# Patient Record
Sex: Female | Born: 1950
Health system: Southern US, Community
[De-identification: ages and names within clinical notes are randomized; demographics above are authoritative.]

## PROBLEM LIST (undated history)

## (undated) DIAGNOSIS — M5124 Other intervertebral disc displacement, thoracic region: Secondary | ICD-10-CM

## (undated) DIAGNOSIS — E785 Hyperlipidemia, unspecified: Secondary | ICD-10-CM

## (undated) DIAGNOSIS — G43909 Migraine, unspecified, not intractable, without status migrainosus: Secondary | ICD-10-CM

## (undated) DIAGNOSIS — M5134 Other intervertebral disc degeneration, thoracic region: Secondary | ICD-10-CM

## (undated) DIAGNOSIS — F319 Bipolar disorder, unspecified: Secondary | ICD-10-CM

## (undated) DIAGNOSIS — M5020 Other cervical disc displacement, unspecified cervical region: Secondary | ICD-10-CM

## (undated) DIAGNOSIS — E119 Type 2 diabetes mellitus without complications: Secondary | ICD-10-CM

## (undated) DIAGNOSIS — F329 Major depressive disorder, single episode, unspecified: Secondary | ICD-10-CM

## (undated) DIAGNOSIS — F32A Depression, unspecified: Secondary | ICD-10-CM

## (undated) DIAGNOSIS — R4589 Other symptoms and signs involving emotional state: Secondary | ICD-10-CM

## (undated) DIAGNOSIS — F419 Anxiety disorder, unspecified: Secondary | ICD-10-CM

## (undated) DIAGNOSIS — M5030 Other cervical disc degeneration, unspecified cervical region: Secondary | ICD-10-CM

## (undated) DIAGNOSIS — F4310 Post-traumatic stress disorder, unspecified: Secondary | ICD-10-CM

## (undated) HISTORY — DX: Other cervical disc displacement, unspecified cervical region: M50.20

## (undated) HISTORY — DX: Other intervertebral disc displacement, thoracic region: M51.24

## (undated) HISTORY — DX: Other cervical disc degeneration, unspecified cervical region: M50.30

## (undated) HISTORY — DX: Other symptoms and signs involving emotional state: R45.89

## (undated) HISTORY — PX: EYE SURGERY: SHX253

## (undated) HISTORY — DX: Hyperlipidemia, unspecified: E78.5

## (undated) HISTORY — DX: Other intervertebral disc degeneration, thoracic region: M51.34

---

## 1995-04-01 HISTORY — PX: HEMICOLECTOMY: SHX854

## 2011-04-01 HISTORY — PX: JOINT REPLACEMENT: SHX530

## 2011-04-07 DIAGNOSIS — M1610 Unilateral primary osteoarthritis, unspecified hip: Secondary | ICD-10-CM | POA: Insufficient documentation

## 2011-04-07 DIAGNOSIS — M87059 Idiopathic aseptic necrosis of unspecified femur: Secondary | ICD-10-CM | POA: Insufficient documentation

## 2012-08-02 DIAGNOSIS — F319 Bipolar disorder, unspecified: Secondary | ICD-10-CM | POA: Insufficient documentation

## 2012-08-02 DIAGNOSIS — F411 Generalized anxiety disorder: Secondary | ICD-10-CM | POA: Insufficient documentation

## 2012-08-02 DIAGNOSIS — M1610 Unilateral primary osteoarthritis, unspecified hip: Secondary | ICD-10-CM | POA: Insufficient documentation

## 2012-08-02 DIAGNOSIS — M169 Osteoarthritis of hip, unspecified: Secondary | ICD-10-CM | POA: Insufficient documentation

## 2012-08-02 DIAGNOSIS — Z96649 Presence of unspecified artificial hip joint: Secondary | ICD-10-CM | POA: Insufficient documentation

## 2012-08-02 DIAGNOSIS — I10 Essential (primary) hypertension: Secondary | ICD-10-CM | POA: Insufficient documentation

## 2012-08-02 DIAGNOSIS — F4310 Post-traumatic stress disorder, unspecified: Secondary | ICD-10-CM | POA: Insufficient documentation

## 2012-08-02 DIAGNOSIS — Z5189 Encounter for other specified aftercare: Secondary | ICD-10-CM | POA: Insufficient documentation

## 2014-04-05 DIAGNOSIS — F310 Bipolar disorder, current episode hypomanic: Secondary | ICD-10-CM | POA: Diagnosis not present

## 2014-06-27 DIAGNOSIS — F310 Bipolar disorder, current episode hypomanic: Secondary | ICD-10-CM | POA: Diagnosis not present

## 2014-07-18 DIAGNOSIS — F310 Bipolar disorder, current episode hypomanic: Secondary | ICD-10-CM | POA: Diagnosis not present

## 2014-07-28 DIAGNOSIS — F310 Bipolar disorder, current episode hypomanic: Secondary | ICD-10-CM | POA: Diagnosis not present

## 2014-08-08 DIAGNOSIS — F310 Bipolar disorder, current episode hypomanic: Secondary | ICD-10-CM | POA: Diagnosis not present

## 2014-08-31 DIAGNOSIS — F310 Bipolar disorder, current episode hypomanic: Secondary | ICD-10-CM | POA: Diagnosis not present

## 2014-09-14 DIAGNOSIS — F310 Bipolar disorder, current episode hypomanic: Secondary | ICD-10-CM | POA: Diagnosis not present

## 2014-09-18 DIAGNOSIS — F310 Bipolar disorder, current episode hypomanic: Secondary | ICD-10-CM | POA: Diagnosis not present

## 2014-10-12 DIAGNOSIS — F310 Bipolar disorder, current episode hypomanic: Secondary | ICD-10-CM | POA: Diagnosis not present

## 2014-10-26 DIAGNOSIS — F3181 Bipolar II disorder: Secondary | ICD-10-CM | POA: Diagnosis not present

## 2014-11-02 DIAGNOSIS — F332 Major depressive disorder, recurrent severe without psychotic features: Secondary | ICD-10-CM | POA: Diagnosis not present

## 2014-11-10 DIAGNOSIS — F3112 Bipolar disorder, current episode manic without psychotic features, moderate: Secondary | ICD-10-CM | POA: Diagnosis not present

## 2014-12-01 DIAGNOSIS — F310 Bipolar disorder, current episode hypomanic: Secondary | ICD-10-CM | POA: Diagnosis not present

## 2015-01-16 DIAGNOSIS — F3342 Major depressive disorder, recurrent, in full remission: Secondary | ICD-10-CM | POA: Diagnosis not present

## 2015-01-16 DIAGNOSIS — Z79899 Other long term (current) drug therapy: Secondary | ICD-10-CM | POA: Diagnosis not present

## 2015-01-16 DIAGNOSIS — E559 Vitamin D deficiency, unspecified: Secondary | ICD-10-CM | POA: Diagnosis not present

## 2015-01-18 DIAGNOSIS — F3342 Major depressive disorder, recurrent, in full remission: Secondary | ICD-10-CM | POA: Diagnosis not present

## 2015-02-01 DIAGNOSIS — F3342 Major depressive disorder, recurrent, in full remission: Secondary | ICD-10-CM | POA: Diagnosis not present

## 2015-02-15 DIAGNOSIS — F3342 Major depressive disorder, recurrent, in full remission: Secondary | ICD-10-CM | POA: Diagnosis not present

## 2015-02-15 DIAGNOSIS — F411 Generalized anxiety disorder: Secondary | ICD-10-CM | POA: Diagnosis not present

## 2015-02-20 DIAGNOSIS — E7800 Pure hypercholesterolemia, unspecified: Secondary | ICD-10-CM | POA: Diagnosis not present

## 2015-02-20 DIAGNOSIS — I10 Essential (primary) hypertension: Secondary | ICD-10-CM | POA: Diagnosis not present

## 2015-02-20 DIAGNOSIS — E119 Type 2 diabetes mellitus without complications: Secondary | ICD-10-CM | POA: Diagnosis not present

## 2015-02-20 DIAGNOSIS — F4320 Adjustment disorder, unspecified: Secondary | ICD-10-CM | POA: Diagnosis not present

## 2015-02-28 DIAGNOSIS — E119 Type 2 diabetes mellitus without complications: Secondary | ICD-10-CM | POA: Diagnosis not present

## 2015-02-28 DIAGNOSIS — H02831 Dermatochalasis of right upper eyelid: Secondary | ICD-10-CM | POA: Diagnosis not present

## 2015-02-28 DIAGNOSIS — I10 Essential (primary) hypertension: Secondary | ICD-10-CM | POA: Diagnosis not present

## 2015-02-28 DIAGNOSIS — H43813 Vitreous degeneration, bilateral: Secondary | ICD-10-CM | POA: Diagnosis not present

## 2015-03-02 DIAGNOSIS — F410 Panic disorder [episodic paroxysmal anxiety] without agoraphobia: Secondary | ICD-10-CM | POA: Diagnosis not present

## 2015-03-19 DIAGNOSIS — F410 Panic disorder [episodic paroxysmal anxiety] without agoraphobia: Secondary | ICD-10-CM | POA: Diagnosis not present

## 2015-03-21 DIAGNOSIS — E119 Type 2 diabetes mellitus without complications: Secondary | ICD-10-CM | POA: Diagnosis not present

## 2015-03-21 DIAGNOSIS — H43813 Vitreous degeneration, bilateral: Secondary | ICD-10-CM | POA: Diagnosis not present

## 2015-03-21 DIAGNOSIS — I10 Essential (primary) hypertension: Secondary | ICD-10-CM | POA: Diagnosis not present

## 2015-03-21 DIAGNOSIS — H02831 Dermatochalasis of right upper eyelid: Secondary | ICD-10-CM | POA: Diagnosis not present

## 2015-05-04 DIAGNOSIS — F3342 Major depressive disorder, recurrent, in full remission: Secondary | ICD-10-CM | POA: Diagnosis not present

## 2015-07-23 DIAGNOSIS — F3342 Major depressive disorder, recurrent, in full remission: Secondary | ICD-10-CM | POA: Diagnosis not present

## 2015-07-23 DIAGNOSIS — Z79899 Other long term (current) drug therapy: Secondary | ICD-10-CM | POA: Diagnosis not present

## 2015-07-24 DIAGNOSIS — F310 Bipolar disorder, current episode hypomanic: Secondary | ICD-10-CM | POA: Diagnosis not present

## 2015-09-12 DIAGNOSIS — Z79899 Other long term (current) drug therapy: Secondary | ICD-10-CM | POA: Diagnosis not present

## 2015-09-12 DIAGNOSIS — F3342 Major depressive disorder, recurrent, in full remission: Secondary | ICD-10-CM | POA: Diagnosis not present

## 2015-10-07 DIAGNOSIS — W57XXXA Bitten or stung by nonvenomous insect and other nonvenomous arthropods, initial encounter: Secondary | ICD-10-CM | POA: Diagnosis not present

## 2015-10-07 DIAGNOSIS — S91311A Laceration without foreign body, right foot, initial encounter: Secondary | ICD-10-CM | POA: Diagnosis not present

## 2015-10-07 DIAGNOSIS — S91331A Puncture wound without foreign body, right foot, initial encounter: Secondary | ICD-10-CM | POA: Diagnosis not present

## 2015-10-07 DIAGNOSIS — Z79899 Other long term (current) drug therapy: Secondary | ICD-10-CM | POA: Diagnosis not present

## 2015-10-07 DIAGNOSIS — S90861A Insect bite (nonvenomous), right foot, initial encounter: Secondary | ICD-10-CM | POA: Diagnosis not present

## 2015-10-07 DIAGNOSIS — I10 Essential (primary) hypertension: Secondary | ICD-10-CM | POA: Diagnosis not present

## 2015-10-07 DIAGNOSIS — Z8659 Personal history of other mental and behavioral disorders: Secondary | ICD-10-CM | POA: Diagnosis not present

## 2015-10-07 DIAGNOSIS — Z9889 Other specified postprocedural states: Secondary | ICD-10-CM | POA: Diagnosis not present

## 2015-10-07 DIAGNOSIS — M1990 Unspecified osteoarthritis, unspecified site: Secondary | ICD-10-CM | POA: Diagnosis not present

## 2015-10-07 DIAGNOSIS — S91332A Puncture wound without foreign body, left foot, initial encounter: Secondary | ICD-10-CM | POA: Diagnosis not present

## 2015-10-07 DIAGNOSIS — M19071 Primary osteoarthritis, right ankle and foot: Secondary | ICD-10-CM | POA: Diagnosis not present

## 2015-10-08 DIAGNOSIS — S91331A Puncture wound without foreign body, right foot, initial encounter: Secondary | ICD-10-CM | POA: Diagnosis not present

## 2015-10-08 DIAGNOSIS — M19071 Primary osteoarthritis, right ankle and foot: Secondary | ICD-10-CM | POA: Diagnosis not present

## 2015-10-09 ENCOUNTER — Encounter (HOSPITAL_COMMUNITY): Payer: Self-pay | Admitting: Emergency Medicine

## 2015-10-09 ENCOUNTER — Emergency Department (HOSPITAL_COMMUNITY)
Admission: EM | Admit: 2015-10-09 | Discharge: 2015-10-09 | Disposition: A | Discharge status: Home / Self Care | Payer: Medicare Other | Attending: Emergency Medicine | Admitting: Emergency Medicine

## 2015-10-09 DIAGNOSIS — F319 Bipolar disorder, unspecified: Secondary | ICD-10-CM | POA: Insufficient documentation

## 2015-10-09 DIAGNOSIS — E119 Type 2 diabetes mellitus without complications: Secondary | ICD-10-CM | POA: Insufficient documentation

## 2015-10-09 DIAGNOSIS — L089 Local infection of the skin and subcutaneous tissue, unspecified: Secondary | ICD-10-CM | POA: Diagnosis present

## 2015-10-09 DIAGNOSIS — L03031 Cellulitis of right toe: Secondary | ICD-10-CM | POA: Diagnosis not present

## 2015-10-09 HISTORY — DX: Major depressive disorder, single episode, unspecified: F32.9

## 2015-10-09 HISTORY — DX: Post-traumatic stress disorder, unspecified: F43.10

## 2015-10-09 HISTORY — DX: Bipolar disorder, unspecified: F31.9

## 2015-10-09 HISTORY — DX: Depression, unspecified: F32.A

## 2015-10-09 HISTORY — DX: Type 2 diabetes mellitus without complications: E11.9

## 2015-10-09 HISTORY — DX: Anxiety disorder, unspecified: F41.9

## 2015-10-09 LAB — CBC WITH DIFFERENTIAL/PLATELET
BASOS ABS: 0 10*3/uL (ref 0.0–0.1)
BASOS PCT: 0 %
Eosinophils Absolute: 0 10*3/uL (ref 0.0–0.7)
Eosinophils Relative: 0 %
HCT: 33.5 % — ABNORMAL LOW (ref 36.0–46.0)
HEMOGLOBIN: 11.1 g/dL — AB (ref 12.0–15.0)
Lymphocytes Relative: 26 %
Lymphs Abs: 1.5 10*3/uL (ref 0.7–4.0)
MCH: 29 pg (ref 26.0–34.0)
MCHC: 33.1 g/dL (ref 30.0–36.0)
MCV: 87.5 fL (ref 78.0–100.0)
Monocytes Absolute: 0.5 10*3/uL (ref 0.1–1.0)
Monocytes Relative: 8 %
NEUTROS ABS: 3.9 10*3/uL (ref 1.7–7.7)
NEUTROS PCT: 66 %
PLATELETS: 179 10*3/uL (ref 150–400)
RBC: 3.83 MIL/uL — ABNORMAL LOW (ref 3.87–5.11)
RDW: 13 % (ref 11.5–15.5)
WBC: 5.8 10*3/uL (ref 4.0–10.5)

## 2015-10-09 LAB — BASIC METABOLIC PANEL
Anion gap: 8 (ref 5–15)
BUN: 21 mg/dL — AB (ref 6–20)
CO2: 23 mmol/L (ref 22–32)
CREATININE: 0.82 mg/dL (ref 0.44–1.00)
Calcium: 8.6 mg/dL — ABNORMAL LOW (ref 8.9–10.3)
Chloride: 109 mmol/L (ref 101–111)
GFR calc Af Amer: >60 mL/min (ref >60)
GLUCOSE: 129 mg/dL — AB (ref 65–99)
POTASSIUM: 3.6 mmol/L (ref 3.5–5.1)
SODIUM: 140 mmol/L (ref 135–145)

## 2015-10-09 LAB — SEDIMENTATION RATE: Sed Rate: 10 mm/hr (ref 0–22)

## 2015-10-09 MED ORDER — VANCOMYCIN HCL IN DEXTROSE 1-5 GM/200ML-% IV SOLN
1000.0000 mg | Freq: Once | INTRAVENOUS | Status: AC
  Administered 2015-10-09: 1000 mg via INTRAVENOUS
  Filled 2015-10-09: qty 200

## 2015-10-09 NOTE — ED Provider Notes (Signed)
CSN: [id number]     Arrival date & time 10/09/15  0130 History  By signing my name below, I, Hansel Feinstein, attest that this documentation has been prepared under the direction and in the presence of Delora Fuel, MD. Electronically Signed: Hansel Feinstein, ED Scribe. 10/09/2015. 4:03 AM.     Chief Complaint  Patient presents with  . Wound Infection   The history is provided by the patient. No language interpreter was used.    HPI Comments: Kaitlyn Wood is a 65 y.o. female with h/o DM who presents to the Emergency Department complaining of increased 4/10 pain and redness to two wounds on the medial right great toe onset yesterday. Per pt, a puncture wound on her medial right great toe became painful and red yesterday. She is not sure when this wound first occurred. She also notes that she was bitten by an insect while gardening 2 days ago in the web spacing of her 1st and 2nd toe and reports that there has been increased pain and redness since yesterday to that area as well. Pt was seen at [facility] yesterday for these complains and started on clindamycin, and reports that the redness began after starting this medication. She denies fever, chills, diaphoresis. Pt is not currently followed by a PCP.    Past Medical History  Diagnosis Date  . Diabetes mellitus without complication ([place])   . Bipolar 1 disorder ([place])   . Depression   . Anxiety   . PTSD (post-traumatic stress disorder)    Past Surgical History  Procedure Laterality Date  . Joint replacement      hip   History reviewed. No pertinent family history. Social History  Substance Use Topics  . Smoking status: Never Smoker   . Smokeless tobacco: None  . Alcohol Use: No   OB History    No data available     Review of Systems  Constitutional: Negative for fever, chills and diaphoresis.  Musculoskeletal: Positive for arthralgias.  Skin: Positive for color change (redness).       +area of pain and redness to wounds on the right foot  All  other systems reviewed and are negative.  Allergies  Oxycodone and Statins  Home Medications   Prior to Admission medications   Not on File   BP 147/110 mmHg  Pulse 77  Temp(Src) 98.8 F (37.1 C) (Oral)  Resp 18  Ht 5\' 3"  (1.6 m)  Wt 181 lb (82.101 kg)  BMI 32.07 kg/m2  SpO2 97% Physical Exam  Constitutional: She is oriented to person, place, and time. She appears well-developed and well-nourished.  Obese.   HENT:  Head: Normocephalic and atraumatic.  Eyes: Conjunctivae and EOM are normal. Pupils are equal, round, and reactive to light.  Neck: Normal range of motion. Neck supple. No JVD present.  Cardiovascular: Normal rate, regular rhythm and normal heart sounds.   No murmur heard. Pulmonary/Chest: Effort normal and breath sounds normal. She has no wheezes. She has no rales. She exhibits no tenderness.  Abdominal: Soft. Bowel sounds are normal. She exhibits no distension and no mass. There is no tenderness.  Musculoskeletal: Normal range of motion. She exhibits edema.  1+ pretibial edema bilaterally. Right 1st toe has a small puncture wound on the plantar surface and a superficial laceration dorsally. Mild erythema of the toe extending to the mid foot. No lymphangitic streaks seen.   Lymphadenopathy:    She has no cervical adenopathy.  Neurological: She is alert and oriented to person, place,  and time. No cranial nerve deficit. She exhibits normal muscle tone. Coordination normal.  Skin: Skin is warm and dry. There is erythema.  Psychiatric: She has a normal mood and affect. Her behavior is normal. Judgment and thought content normal.  Nursing note and vitals reviewed.   ED Course  Procedures (including critical care time) DIAGNOSTIC STUDIES: Oxygen Saturation is 97% on RA, normal by my interpretation.    COORDINATION OF CARE: 4:00 AM Discussed treatment plan with pt at bedside which includes lab work and pt agreed to plan.   Labs Review Results for orders placed or  performed during the hospital encounter of 10/09/15  CBC with Differential  Result Value Ref Range   WBC 5.8 4.0 - 10.5 K/uL   RBC 3.83 (L) 3.87 - 5.11 MIL/uL   Hemoglobin 11.1 (L) 12.0 - 15.0 g/dL   HCT 33.5 (L) 36.0 - 46.0 %   MCV 87.5 78.0 - 100.0 fL   MCH 29.0 26.0 - 34.0 pg   MCHC 33.1 30.0 - 36.0 g/dL   RDW 13.0 11.5 - 15.5 %   Platelets 179 150 - 400 K/uL   Neutrophils Relative % 66 %   Neutro Abs 3.9 1.7 - 7.7 K/uL   Lymphocytes Relative 26 %   Lymphs Abs 1.5 0.7 - 4.0 K/uL   Monocytes Relative 8 %   Monocytes Absolute 0.5 0.1 - 1.0 K/uL   Eosinophils Relative 0 %   Eosinophils Absolute 0.0 0.0 - 0.7 K/uL   Basophils Relative 0 %   Basophils Absolute 0.0 0.0 - 0.1 K/uL  Basic metabolic panel  Result Value Ref Range   Sodium 140 135 - 145 mmol/L   Potassium 3.6 3.5 - 5.1 mmol/L   Chloride 109 101 - 111 mmol/L   CO2 23 22 - 32 mmol/L   Glucose, Bld 129 (H) 65 - 99 mg/dL   BUN 21 (H) 6 - 20 mg/dL   Creatinine, Ser 0.82 0.44 - 1.00 mg/dL   Calcium 8.6 (L) 8.9 - 10.3 mg/dL   GFR calc non Af Amer >60 >60 mL/min   GFR calc Af Amer >60 >60 mL/min   Anion gap 8 5 - 15  Sedimentation rate  Result Value Ref Range   Sed Rate 10 0 - 22 mm/hr   I have personally reviewed and evaluated these lab results as part of my medical decision-making.    MDM   Final diagnoses:  Cellulitis of great toe, right    Cellulitis of the right first toe and surrounding midfoot with puncture wound noted on the plantar surface. Slightly concerning that the erythema seems to be increasing after initial dose of antibiotic, but she has not received an adequate trial of the antibiotic. Screening labs are obtained including sedimentation rate and are reassuring. Sedimentation rate is 10 and WBC 5.8 without left shift. She was given dose of vancomycin while waiting for results. She is advised to continue taking her antibiotic and to soak the foot warm water several times a day. Return should she  develop fever, increasing pain, or significant spread of the erythema.  I personally performed the services described in this documentation, which was scribed in my presence. The recorded information has been reviewed and is accurate.      Delora Fuel, MD [id number]

## 2015-10-09 NOTE — Discharge Instructions (Signed)
Continue taking the antibiotic that was prescribed for you. Return if redness seems to be spreading, you start developing a fever, or your foot becomes significantly more painful. You should have this rechecked in 2 days to makes her that it is responding appropriately.   Cellulitis Cellulitis is an infection of the skin and the tissue beneath it. The infected area is usually red and tender. Cellulitis occurs most often in the arms and lower legs.  CAUSES  Cellulitis is caused by bacteria that enter the skin through cracks or cuts in the skin. The most common types of bacteria that cause cellulitis are staphylococci and streptococci. SIGNS AND SYMPTOMS   Redness and warmth.  Swelling.  Tenderness or pain.  Fever. DIAGNOSIS  Your health care provider can usually determine what is wrong based on a physical exam. Blood tests may also be done. TREATMENT  Treatment usually involves taking an antibiotic medicine. HOME CARE INSTRUCTIONS   Take your antibiotic medicine as directed by your health care provider. Finish the antibiotic even if you start to feel better.  Keep the infected arm or leg elevated to reduce swelling.  Apply a warm cloth to the affected area up to 4 times per day to relieve pain.  Take medicines only as directed by your health care provider.  Keep all follow-up visits as directed by your health care provider. SEEK MEDICAL CARE IF:   You notice red streaks coming from the infected area.  Your red area gets larger or turns dark in color.  Your bone or joint underneath the infected area becomes painful after the skin has healed.  Your infection returns in the same area or another area.  You notice a swollen bump in the infected area.  You develop new symptoms.  You have a fever. SEEK IMMEDIATE MEDICAL CARE IF:   You feel very sleepy.  You develop vomiting or diarrhea.  You have a general ill feeling (malaise) with muscle aches and pains.   This  information is not intended to replace advice given to you by your health care provider. Make sure you discuss any questions you have with your health care provider.   Document Released: 12/25/2004 Document Revised: 12/06/2014 Document Reviewed: 06/02/2011 Elsevier Interactive Patient Education [employer].

## 2015-10-09 NOTE — ED Notes (Signed)
Pt states that she got a puncture wound yesterday to the bottom of her rt great toe.  Pt states that she has an appt for it today at 145 pm but "they told me if it looks infected to come to the urgent care".  Redness noted to area.

## 2015-10-09 NOTE — ED Notes (Signed)
Pt not visualized in waiting room.

## 2015-10-11 ENCOUNTER — Ambulatory Visit (HOSPITAL_COMMUNITY)
Admission: EM | Admit: 2015-10-11 | Discharge: 2015-10-11 | Disposition: A | Discharge status: Home / Self Care | Payer: Medicare Other | Attending: Family Medicine | Admitting: Family Medicine

## 2015-10-11 ENCOUNTER — Encounter (HOSPITAL_COMMUNITY): Payer: Self-pay | Admitting: Emergency Medicine

## 2015-10-11 DIAGNOSIS — L03031 Cellulitis of right toe: Secondary | ICD-10-CM | POA: Diagnosis not present

## 2015-10-11 DIAGNOSIS — R030 Elevated blood-pressure reading, without diagnosis of hypertension: Secondary | ICD-10-CM | POA: Diagnosis not present

## 2015-10-11 DIAGNOSIS — Z5189 Encounter for other specified aftercare: Secondary | ICD-10-CM

## 2015-10-11 DIAGNOSIS — IMO0001 Reserved for inherently not codable concepts without codable children: Secondary | ICD-10-CM

## 2015-10-11 MED ORDER — CLINDAMYCIN HCL 300 MG PO CAPS: 300.0000 mg | ORAL_CAPSULE | Freq: Three times a day (TID) | ORAL | Status: DC

## 2015-10-11 MED ORDER — KETOROLAC TROMETHAMINE 30 MG/ML IJ SOLN
INTRAMUSCULAR | Status: AC
  Filled 2015-10-11: qty 1

## 2015-10-11 MED ORDER — KETOROLAC TROMETHAMINE 60 MG/2ML IM SOLN
30.0000 mg | Freq: Once | INTRAMUSCULAR | Status: AC
  Administered 2015-10-11: 30 mg via INTRAMUSCULAR

## 2015-10-11 NOTE — ED Notes (Signed)
The patient presented to the [address] [facility] with a complaint of needing the wound on her great toe on the right foot checked. The patient was evaluated at [facility] ED for cellulitis and was treated with IV antibiotics and advised to return to the [facility] for a wound check.

## 2015-10-11 NOTE — ED Provider Notes (Signed)
CSN: [id number]     Arrival date & time 10/11/15  1825 History   First MD Initiated Contact with Patient 10/11/15 1939     Chief Complaint  Patient presents with  . Wound Check   (Consider location/radiation/quality/duration/timing/severity/associated sxs/prior Treatment) HPI  Latea Lockard is a 65 y.o. female presenting to UC for recheck of an infected wound on her Right great toe.  Pt is new to the area. She initially went to an urgent care and was prescribed 3 days worth of oral antibiotics and a topical antibiotic. She was seen at [facility] ED on 10/09/15 for same, she was given a shot of vancomycin and advised to keep taking her antibiotics. She is here today for a recheck as she is not sure it is healing properly.  She has hx of DM.   Toe pain is mildly sore, 3/10.  She notes there are still 2 wounds and mild redness but states it does look better than it use to. Denies fever, n/v/d.  She does not have a PCP yet to f/u with as she is new to the area.   BP elevated in triage. No hx of HTN. She does have mild generalized headache, hx of migraines. She has not taken anything for her headache or foot pain PTA.    Past Medical History  Diagnosis Date  . Diabetes mellitus without complication ([place])   . Bipolar 1 disorder ([place])   . Depression   . Anxiety   . PTSD (post-traumatic stress disorder)    Past Surgical History  Procedure Laterality Date  . Joint replacement      hip   History reviewed. No pertinent family history. Social History  Substance Use Topics  . Smoking status: Never Smoker   . Smokeless tobacco: None  . Alcohol Use: No   OB History    No data available     Review of Systems  Constitutional: Negative for fever and chills.  Gastrointestinal: Negative for nausea, vomiting, abdominal pain and diarrhea.  Musculoskeletal: Positive for myalgias and arthralgias. Negative for joint swelling.  Skin: Positive for color change and wound.  Neurological: Positive for  headaches. Negative for dizziness and light-headedness.    Allergies  Oxycodone and Statins  Home Medications   Prior to Admission medications   Medication Sig Start Date End Date Taking? Authorizing Provider  buPROPion (WELLBUTRIN SR) 150 MG 12 hr tablet Take 150 mg by mouth 2 (two) times daily.   Yes Historical Provider, MD  clonazePAM (KLONOPIN) 1 MG tablet Take 1 mg by mouth 2 (two) times daily.   Yes Historical Provider, MD  lamoTRIgine (LAMICTAL) 200 MG tablet Take 200 mg by mouth daily.   Yes Historical Provider, MD  lisdexamfetamine (VYVANSE) 30 MG capsule Take 30 mg by mouth daily.   Yes Historical Provider, MD  lurasidone (LATUDA) 40 MG TABS tablet Take 40 mg by mouth daily with breakfast.   Yes Historical Provider, MD  clindamycin (CLEOCIN) 300 MG capsule Take 1 capsule (300 mg total) by mouth 3 (three) times daily. X 5 days 10/11/15   Noland Fordyce, PA-C   Meds Ordered and Administered this Visit   Medications  ketorolac (TORADOL) injection 30 mg (30 mg Intramuscular Given 10/11/15 2014)    BP 138/81 mmHg  Pulse 85  Temp(Src) 98.6 F (37 C) (Oral)  Resp 16  SpO2 99% No data found.   Physical Exam  Constitutional: She is oriented to person, place, and time. She appears well-developed and well-nourished.  HENT:  Head: Normocephalic and atraumatic.  Eyes: EOM are normal.  Neck: Normal range of motion.  Cardiovascular: Normal rate.   Pulmonary/Chest: Effort normal.  Musculoskeletal: Normal range of motion. She exhibits tenderness. She exhibits no edema.  Right great toe: no edema, full ROM, mildly tender (see skin exam)  Neurological: She is alert and oriented to person, place, and time.  Skin: Skin is warm and dry. There is erythema.  Right great toe: 2 lacerations on toe between web space of 1st and 2nd toe. Mild erythema. No bleeding or drainage. Wound appears clean and dry. No red streaking.  Psychiatric: She has a normal mood and affect. Her behavior is normal.   Nursing note and vitals reviewed.   ED Course  Procedures (including critical care time)  Labs Review Labs Reviewed - No data to display  Imaging Review No results found.   MDM   1. Encounter for wound re-check   2. Cellulitis of great toe, right   3. Elevated blood pressure    Pt presenting to UC for wound recheck. Wound appears to be healing well, however, mild erythema and wounds still present. Pt notes she was only on 3 days of oral antibiotics. Will prescribe 5 more days of clindamycin. Encouraged to keep wound clean with soap and water, may use her prescription antibiotic ointment, cover with bandage.  BP initially elevated at 158/111, rechecked- 138/81  Encouraged f/u with PCP at [facility]. Info for foot centers provided as well. F/u in 4-5 days if not improving, sooner if worsening. Patient verbalized understanding and agreement with treatment plan.   Noland Fordyce, PA-C 10/12/15 [phone]

## 2015-10-11 NOTE — Discharge Instructions (Signed)
You should continue to keep wounds clean with warm soap and water. Change bandage 2-3 times a day and use antibiotic ointment as prescribed.  Please take oral antibiotic as prescribed.  It is important to establish care with a podiatrist for ongoing care of foot wound and for ongoing health maintenance with your history of diabetes.   Please follow up with primary care for ongoing health maintenance and monitoring of blood pressure.

## 2015-11-29 DIAGNOSIS — F3131 Bipolar disorder, current episode depressed, mild: Secondary | ICD-10-CM | POA: Diagnosis not present

## 2015-12-04 DIAGNOSIS — Z01419 Encounter for gynecological examination (general) (routine) without abnormal findings: Secondary | ICD-10-CM | POA: Diagnosis not present

## 2015-12-04 DIAGNOSIS — Z124 Encounter for screening for malignant neoplasm of cervix: Secondary | ICD-10-CM | POA: Diagnosis not present

## 2015-12-04 DIAGNOSIS — Z1231 Encounter for screening mammogram for malignant neoplasm of breast: Secondary | ICD-10-CM | POA: Diagnosis not present

## 2015-12-04 LAB — HM PAP SMEAR
HM PAP: NORMAL
HM Pap smear: NORMAL

## 2015-12-04 LAB — HM MAMMOGRAPHY: HM Mammogram: NORMAL (ref 0–4)

## 2016-01-04 ENCOUNTER — Encounter: Payer: Self-pay | Admitting: Internal Medicine

## 2016-01-04 ENCOUNTER — Ambulatory Visit (INDEPENDENT_AMBULATORY_CARE_PROVIDER_SITE_OTHER): Payer: Medicare Other | Admitting: Internal Medicine

## 2016-01-04 VITALS — BP 122/72 | HR 90 | Temp 98.1°F | Ht 63.0 in | Wt 183.4 lb

## 2016-01-04 DIAGNOSIS — E119 Type 2 diabetes mellitus without complications: Secondary | ICD-10-CM

## 2016-01-04 DIAGNOSIS — M5020 Other cervical disc displacement, unspecified cervical region: Secondary | ICD-10-CM | POA: Diagnosis not present

## 2016-01-04 DIAGNOSIS — F319 Bipolar disorder, unspecified: Secondary | ICD-10-CM

## 2016-01-04 DIAGNOSIS — E785 Hyperlipidemia, unspecified: Secondary | ICD-10-CM

## 2016-01-04 DIAGNOSIS — Z1159 Encounter for screening for other viral diseases: Secondary | ICD-10-CM | POA: Diagnosis not present

## 2016-01-04 DIAGNOSIS — Z23 Encounter for immunization: Secondary | ICD-10-CM

## 2016-01-04 DIAGNOSIS — N3941 Urge incontinence: Secondary | ICD-10-CM | POA: Diagnosis not present

## 2016-01-04 DIAGNOSIS — Z79899 Other long term (current) drug therapy: Secondary | ICD-10-CM | POA: Diagnosis not present

## 2016-01-04 DIAGNOSIS — G4700 Insomnia, unspecified: Secondary | ICD-10-CM | POA: Diagnosis not present

## 2016-01-04 DIAGNOSIS — Z800 Family history of malignant neoplasm of digestive organs: Secondary | ICD-10-CM

## 2016-01-04 LAB — CBC WITH DIFFERENTIAL/PLATELET
BASOS ABS: 0 {cells}/uL (ref 0–200)
Basophils Relative: 0 %
EOS ABS: 0 {cells}/uL — AB (ref 15–500)
Eosinophils Relative: 0 %
HCT: 41.6 % (ref 35.0–45.0)
HEMOGLOBIN: 13.9 g/dL (ref 11.7–15.5)
LYMPHS ABS: 1200 {cells}/uL (ref 850–3900)
Lymphocytes Relative: 20 %
MCH: 29.5 pg (ref 27.0–33.0)
MCHC: 33.4 g/dL (ref 32.0–36.0)
MCV: 88.3 fL (ref 80.0–100.0)
MONO ABS: 360 {cells}/uL (ref 200–950)
MPV: 9.4 fL (ref 7.5–12.5)
Monocytes Relative: 6 %
NEUTROS ABS: 4440 {cells}/uL (ref 1500–7800)
Neutrophils Relative %: 74 %
Platelets: 231 10*3/uL (ref 140–400)
RBC: 4.71 MIL/uL (ref 3.80–5.10)
RDW: 12.9 % (ref 11.0–15.0)
WBC: 6 10*3/uL (ref 3.8–10.8)

## 2016-01-04 LAB — COMPLETE METABOLIC PANEL WITH GFR
ALT: 13 U/L (ref 6–29)
AST: 20 U/L (ref 10–35)
Albumin: 4.4 g/dL (ref 3.6–5.1)
Alkaline Phosphatase: 81 U/L (ref 33–130)
BILIRUBIN TOTAL: 0.4 mg/dL (ref 0.2–1.2)
BUN: 23 mg/dL (ref 7–25)
CALCIUM: 9.5 mg/dL (ref 8.6–10.4)
CHLORIDE: 100 mmol/L (ref 98–110)
CO2: 27 mmol/L (ref 20–31)
CREATININE: 1.04 mg/dL — AB (ref 0.50–0.99)
GFR, EST AFRICAN AMERICAN: 65 mL/min (ref >=60)
GFR, Est Non African American: 56 mL/min — ABNORMAL LOW (ref >=60)
Glucose, Bld: 137 mg/dL — ABNORMAL HIGH (ref 65–99)
Potassium: 4.5 mmol/L (ref 3.5–5.3)
Sodium: 137 mmol/L (ref 135–146)
TOTAL PROTEIN: 6.7 g/dL (ref 6.1–8.1)

## 2016-01-04 LAB — LIPID PANEL
CHOLESTEROL: 239 mg/dL — AB (ref 125–200)
HDL: 60 mg/dL (ref >=46)
LDL Cholesterol: 151 mg/dL — ABNORMAL HIGH (ref <130)
TRIGLYCERIDES: 139 mg/dL (ref <150)
Total CHOL/HDL Ratio: 4 Ratio (ref <=5.0)
VLDL: 28 mg/dL (ref <30)

## 2016-01-04 LAB — HEMOGLOBIN A1C
Hgb A1c MFr Bld: 6 % — ABNORMAL HIGH (ref <5.7)
Mean Plasma Glucose: 126 mg/dL

## 2016-01-04 LAB — TSH: TSH: 1.61 mIU/L

## 2016-01-04 MED ORDER — ZOSTER VACCINE LIVE 19400 UNT/0.65ML ~~LOC~~ SUSR
0.6500 mL | Freq: Once | SUBCUTANEOUS | 0 refills | Status: AC
Start: 2016-01-04 — End: 2016-01-04

## 2016-01-04 NOTE — Progress Notes (Signed)
Patient ID: Kaitlyn Wood, female   DOB: 07-Jun-1950, 65 y.o.   MRN: [id number]    Location:  [facility] Place of Service: OFFICE    Advanced Directive information Does patient have an advance directive?: No, Would patient like information on creating an advanced directive?: Yes - Educational materials given  Chief Complaint  Patient presents with  . Establish Care    HPI:  64 yo female seen today as a new pt. She is a poor historian due to psych d/o. Hx obtained from chart. She c/o loss of appetite, weight loss, dry eyes, cataracts, diplopia, sinus problems, dentures, urinary urgency and incontinence, myalgias, HA, tremors, loss of balance, memory loss, nervousness, stress, mood swings, bruising, lymphedema.   She relocated from [place], [place] in June 2017.  Urinary incontinence - urgency. She was told by GYN that she has prolapsed uterus. diabet  Bipolar d/o/anxiety/insomnia/PTSD - she is followed by psych. Current provider is Dr Casimiro Needle. She would like to see psych with geriatric focus. She is taking wellbutrin sr, klonopin, neurontin, lamictal, vyvanse, latuda and trazodone.   Hyperlipidemia - she reports intolerance to statins as they caused "my urine to turn brown". She is hesitant to try any other medications. She states LDL was 180 in the past.   DM - she takes metformin daily. She does not check BS at home as she needs a new glucometer and supplies. She thinks last A1c <7%. She has not had any numbness/tingling. She saw eye Dr in May and was told no diabetic changes.  Cervical/lumbar herniated/bulging discs - C2-T4; she reports no significant pain at this time. She has never had sx. Initial injury was falling off porch with whiplash injury many years ago.  FHx cancer - Mother with ovarian cancer; sister/MGM with colorectal cancer; Father with Lymphoma  Past Medical History:  Diagnosis Date  . Anxiety   . Bipolar 1 disorder ([place])   . Bulging of cervical intervertebral disc    C2  .  Bulging of thoracic intervertebral disc    t4  . Depression   . Diabetes mellitus without complication ([place])   . Hyperlipidemia   . PTSD (post-traumatic stress disorder)     Past Surgical History:  Procedure Laterality Date  . HEMICOLECTOMY  1997  . JOINT REPLACEMENT  2013   hip    Patient Care Team: No Pcp Per Patient as PCP - General (General Practice) Brien Few, MD as Consulting Physician (Obstetrics and Gynecology)  Social History   Social History  . Marital status: Divorced    Spouse name: N/A  . Number of children: N/A  . Years of education: N/A   Occupational History  . Not on file.   Social History Main Topics  . Smoking status: Never Smoker  . Smokeless tobacco: Never Used  . Alcohol use No  . Drug use: No  . Sexual activity: Not on file   Other Topics Concern  . Not on file   Social History Narrative   Diet? ? Fresh food- very little prepared food- no fast food.       Do you drink/eat things with caffeine?  Yes      Marital status?         Divorced                           What year were you married? 1978      Do you live in a house, apartment, assisted living, [place],  trailer, etc.? apartment      Is it one or more stories? Yes      How many persons live in your home? 1      Do you have any pets in your home? (please list)  1 cat, Polly      Current or past profession: Air traffic controller, sales       Do you exercise?       yes                               Type & how often? 2-3 times a week      Do you have a living will? No      Do you have a DNR form?    no                              If not, do you want to discuss one? yes      Do you have signed POA/HPOA for forms? no     reports that she has never smoked. She has never used smokeless tobacco. She reports that she does not drink alcohol or use drugs.  Family History  Problem Relation Age of Onset  . Ovarian cancer Mother   . Lymphoma Father   . Colon cancer Sister     Family Status  Relation Status  . Mother Deceased  . Father Deceased  . Sister Deceased at age 53  . Sister Alive  . Son Alive  . Daughter Alive  . Son Alive  . Son Deceased at age 57   Heroin/clonazipan overdose    Immunization History  Administered Date(s) Administered  . Influenza-Unspecified 12/29/2013  . Tdap 09/29/2015    Allergies  Allergen Reactions  . Oxycodone     Migraines  . Statins     "makes my urine turn brown"    Medications: Patient's Medications  New Prescriptions   No medications on file  Previous Medications   ALPHA LIPOIC ACID PO    Take by mouth daily.   ASCORBIC ACID (VITAMIN C) 1000 MG TABLET    Take 1,000 mg by mouth daily.   BUPROPION (WELLBUTRIN SR) 150 MG 12 HR TABLET    Take 150 mg by mouth 2 (two) times daily.   CHOLECALCIFEROL (VITAMIN D PO)    Take 300 mg by mouth daily.   CLONAZEPAM (KLONOPIN) 1 MG TABLET    Take 0.5-1 mg by mouth 2 (two) times daily as needed.    GABAPENTIN (NEURONTIN) 300 MG CAPSULE    Take 300 mg by mouth 2 (two) times daily.   LAMOTRIGINE (LAMICTAL) 200 MG TABLET    Take 200 mg by mouth 2 (two) times daily.    LISDEXAMFETAMINE (VYVANSE) 40 MG CAPSULE    Take 40 mg by mouth every morning.   LURASIDONE (LATUDA) 40 MG TABS TABLET    Take 40 mg by mouth daily with breakfast.   METFORMIN (GLUCOPHAGE) 500 MG TABLET    Take 500 mg by mouth daily.   PROLINE PO    Take by mouth daily.   TRAZODONE (DESYREL) 50 MG TABLET    Take 25 mg by mouth at bedtime as needed.   Modified Medications   Modified Medication Previous Medication   ZOSTER VACCINE LIVE, PF, (ZOSTAVAX) 09604 UNT/0.65ML INJECTION Zoster Vaccine Live, PF, (ZOSTAVAX) 54098 UNT/0.65ML injection  Inject 19,400 Units into the skin once.    Inject 0.65 mLs into the skin once.  Discontinued Medications   No medications on file    Review of Systems  Unable to perform ROS: Psychiatric disorder    Vitals:   01/04/16 0850  BP: 122/72  Pulse: 90  Temp:  98.1 F (36.7 C)  TempSrc: Oral  SpO2: 96%  Weight: 183 lb 6.4 oz (83.2 kg)  Height: _0  (1.6 m)   Body mass index is 32.49 kg/m.  Physical Exam  Constitutional: She appears well-developed and well-nourished.  HENT:  Mouth/Throat: Oropharynx is clear and moist. No oropharyngeal exudate.  Eyes: Pupils are equal, round, and reactive to light. No scleral icterus.  Neck: Neck supple. Carotid bruit is not present. No tracheal deviation present.  Cardiovascular: Normal rate, regular rhythm, normal heart sounds and intact distal pulses.  Exam reveals no gallop and no friction rub.   No murmur heard. No LE edema b/l. no calf TTP.   Pulmonary/Chest: Effort normal and breath sounds normal. No stridor. No respiratory distress. She has no wheezes. She has no rales.  Abdominal: Soft. Bowel sounds are normal. She exhibits no distension and no mass. There is no hepatomegaly. There is no tenderness. There is no rebound and no guarding.  Musculoskeletal: She exhibits edema.  Lymphadenopathy:    She has no cervical adenopathy.  Neurological: She is alert.  Skin: Skin is warm and dry. No rash noted.  Psychiatric: Her behavior is normal. Thought content normal. Her mood appears anxious. Her speech is tangential.     Labs reviewed: Office Visit on 01/04/2016  Component Date Value Ref Range Status  . HM Mammogram 12/04/2015 Self Reported Normal  0-4 Bi-Rad, Self Reported Normal Final  . HM Pap smear 12/04/2015 normal   Final  Admission on 10/09/2015, Discharged on 10/09/2015  Component Date Value Ref Range Status  . WBC 10/09/2015 5.8  4.0 - 10.5 K/uL Final  . RBC 10/09/2015 3.83* 3.87 - 5.11 MIL/uL Final  . Hemoglobin 10/09/2015 11.1* 12.0 - 15.0 g/dL Final  . HCT 10/09/2015 33.5* 36.0 - 46.0 % Final  . MCV 10/09/2015 87.5  78.0 - 100.0 fL Final  . MCH 10/09/2015 29.0  26.0 - 34.0 pg Final  . MCHC 10/09/2015 33.1  30.0 - 36.0 g/dL Final  . RDW 10/09/2015 13.0  11.5 - 15.5 % Final  .  Platelets 10/09/2015 179  150 - 400 K/uL Final  . Neutrophils Relative % 10/09/2015 66  % Final  . Neutro Abs 10/09/2015 3.9  1.7 - 7.7 K/uL Final  . Lymphocytes Relative 10/09/2015 26  % Final  . Lymphs Abs 10/09/2015 1.5  0.7 - 4.0 K/uL Final  . Monocytes Relative 10/09/2015 8  % Final  . Monocytes Absolute 10/09/2015 0.5  0.1 - 1.0 K/uL Final  . Eosinophils Relative 10/09/2015 0  % Final  . Eosinophils Absolute 10/09/2015 0.0  0.0 - 0.7 K/uL Final  . Basophils Relative 10/09/2015 0  % Final  . Basophils Absolute 10/09/2015 0.0  0.0 - 0.1 K/uL Final  . Sodium 10/09/2015 140  135 - 145 mmol/L Final  . Potassium 10/09/2015 3.6  3.5 - 5.1 mmol/L Final  . Chloride 10/09/2015 109  101 - 111 mmol/L Final  . CO2 10/09/2015 23  22 - 32 mmol/L Final  . Glucose, Bld 10/09/2015 129* 65 - 99 mg/dL Final  . BUN 10/09/2015 21* 6 - 20 mg/dL Final  . Creatinine, Ser 10/09/2015 0.82  0.44 -  1.00 mg/dL Final  . Calcium 10/09/2015 8.6* 8.9 - 10.3 mg/dL Final  . GFR calc non Af Amer 10/09/2015 >60  >60 mL/min Final  . GFR calc Af Amer 10/09/2015 >60  >60 mL/min Final   Comment: (NOTE) The eGFR has been calculated using the CKD EPI equation. This calculation has not been validated in all clinical situations. eGFR's persistently <60 mL/min signify possible Chronic Kidney Disease.   . Anion gap 10/09/2015 8  5 - 15 Final  . Sed Rate 10/09/2015 10  0 - 22 mm/hr Final    No results found.   Assessment/Plan   ICD-9-CM ICD-10-CM   1. Type 2 diabetes mellitus without complication, without long-term current use of insulin (HCC) 250.00 B84.6 COMPLETE METABOLIC PANEL WITH GFR     Hemoglobin A1C     Microalbumin / creatinine urine ratio  2. Hyperlipidemia, unspecified hyperlipidemia type 272.4 E78.5 Lipid panel     TSH  3. Need for hepatitis C screening test V73.89 Z11.59 Hepatitis C Antibody  4. Bipolar I disorder (HCC) 296.7 F31.9   5. Insomnia, unspecified type 780.52 G47.00   6. Cervical herniated  disc 722.0 M50.20   7. Family hx of colon cancer V16.0 Z80.0   8. Urge incontinence of urine 788.31 N39.41   9. High risk medication use V58.69 K59.935 COMPLETE METABOLIC PANEL WITH GFR     CBC with Differential/Platelet  10. Need for vaccination with 13-polyvalent pneumococcal conjugate vaccine V03.82 Z23 Pneumococcal conjugate vaccine 13-valent    prevnar vaccine given today.  Ok to get high dose flu shot today at local pharmacy  Recommend wait 4 weeks before getting Shingles vaccine  Glucometer to check BS daily.   Will need colon cancer screening next appt (needs colonoscopy)  Will call with lab results  Follow up in 1 -2 mos for CPE/MMSE/ECG.   Kaitlyn Wood  [facility] [address] [place], [place] [address] [phone] Cell (Monday-Friday 8 AM - 5 PM) [phone] After 5 PM and follow prompts

## 2016-01-04 NOTE — Patient Instructions (Signed)
prevnar vaccine given today.  Ok to get high dose flu shot today at local pharmacy  Recommend wait 4 weeks before getting Shingles vaccine  Will call with lab results  Follow up in 1 -2 mos for CPE/MMSE/ECG.

## 2016-01-05 LAB — MICROALBUMIN / CREATININE URINE RATIO
CREATININE, URINE: 222 mg/dL (ref 20–320)
MICROALB UR: 1.1 mg/dL
Microalb Creat Ratio: 5 mcg/mg creat (ref <30)

## 2016-01-05 LAB — HEPATITIS C ANTIBODY: HCV Ab: NEGATIVE

## 2016-01-08 ENCOUNTER — Other Ambulatory Visit: Payer: Self-pay

## 2016-01-08 ENCOUNTER — Other Ambulatory Visit: Payer: Self-pay | Admitting: *Deleted

## 2016-01-08 MED ORDER — ONETOUCH BASIC SYSTEM W/DEVICE KIT: PACK | 0 refills | Status: DC

## 2016-01-08 MED ORDER — ONETOUCH LANCETS MISC: 12 refills | Status: DC

## 2016-01-08 MED ORDER — ONETOUCH ULTRA SYSTEM W/DEVICE KIT: PACK | 0 refills | Status: DC

## 2016-01-08 MED ORDER — GLUCOSE BLOOD VI STRP: ORAL_STRIP | 12 refills | Status: DC

## 2016-01-08 MED ORDER — GLUCOSE BLOOD VI STRP: ORAL_STRIP | 3 refills | Status: DC

## 2016-01-08 NOTE — Telephone Encounter (Signed)
[employer]

## 2016-01-21 DIAGNOSIS — F3131 Bipolar disorder, current episode depressed, mild: Secondary | ICD-10-CM | POA: Diagnosis not present

## 2016-01-22 DIAGNOSIS — F3131 Bipolar disorder, current episode depressed, mild: Secondary | ICD-10-CM | POA: Diagnosis not present

## 2016-02-06 DIAGNOSIS — F3131 Bipolar disorder, current episode depressed, mild: Secondary | ICD-10-CM | POA: Diagnosis not present

## 2016-02-19 ENCOUNTER — Other Ambulatory Visit: Payer: Self-pay | Admitting: *Deleted

## 2016-02-19 MED ORDER — GLUCOSE BLOOD VI STRP: ORAL_STRIP | 3 refills | Status: DC

## 2016-02-19 MED ORDER — GABAPENTIN 300 MG PO CAPS: 300.0000 mg | ORAL_CAPSULE | Freq: Two times a day (BID) | ORAL | 0 refills | Status: DC

## 2016-02-19 MED ORDER — ONETOUCH LANCETS MISC: 12 refills | Status: DC

## 2016-03-10 DIAGNOSIS — F3131 Bipolar disorder, current episode depressed, mild: Secondary | ICD-10-CM | POA: Diagnosis not present

## 2016-03-27 ENCOUNTER — Telehealth: Payer: Self-pay | Admitting: Internal Medicine

## 2016-03-27 NOTE — Telephone Encounter (Signed)
left msg asking pt to confirm this AWV appt w/ nurse. VDM (DD) °

## 2016-04-01 NOTE — Telephone Encounter (Signed)
left another msg asking pt to confirm this AWV appt w/ nurse. VDM (DD) °

## 2016-04-03 DIAGNOSIS — F3131 Bipolar disorder, current episode depressed, mild: Secondary | ICD-10-CM | POA: Diagnosis not present

## 2016-04-04 ENCOUNTER — Ambulatory Visit (INDEPENDENT_AMBULATORY_CARE_PROVIDER_SITE_OTHER): Payer: Medicare Other | Admitting: Internal Medicine

## 2016-04-04 ENCOUNTER — Ambulatory Visit: Payer: Medicare Other

## 2016-04-04 ENCOUNTER — Encounter: Payer: Self-pay | Admitting: Internal Medicine

## 2016-04-04 VITALS — BP 162/86 | HR 106 | Temp 98.1°F | Ht 63.0 in | Wt 189.8 lb

## 2016-04-04 DIAGNOSIS — R030 Elevated blood-pressure reading, without diagnosis of hypertension: Secondary | ICD-10-CM

## 2016-04-04 DIAGNOSIS — Z79899 Other long term (current) drug therapy: Secondary | ICD-10-CM

## 2016-04-04 DIAGNOSIS — M25561 Pain in right knee: Secondary | ICD-10-CM | POA: Diagnosis not present

## 2016-04-04 DIAGNOSIS — Z1211 Encounter for screening for malignant neoplasm of colon: Secondary | ICD-10-CM | POA: Diagnosis not present

## 2016-04-04 DIAGNOSIS — E782 Mixed hyperlipidemia: Secondary | ICD-10-CM

## 2016-04-04 DIAGNOSIS — E119 Type 2 diabetes mellitus without complications: Secondary | ICD-10-CM | POA: Diagnosis not present

## 2016-04-04 DIAGNOSIS — G8929 Other chronic pain: Secondary | ICD-10-CM

## 2016-04-04 DIAGNOSIS — Z0000 Encounter for general adult medical examination without abnormal findings: Secondary | ICD-10-CM

## 2016-04-04 DIAGNOSIS — Z23 Encounter for immunization: Secondary | ICD-10-CM | POA: Diagnosis not present

## 2016-04-04 DIAGNOSIS — N3941 Urge incontinence: Secondary | ICD-10-CM | POA: Diagnosis not present

## 2016-04-04 DIAGNOSIS — G4700 Insomnia, unspecified: Secondary | ICD-10-CM

## 2016-04-04 MED ORDER — METFORMIN HCL 500 MG PO TABS: 500.0000 mg | ORAL_TABLET | Freq: Every day | ORAL | 6 refills | Status: DC

## 2016-04-04 MED ORDER — ZOSTER VACCINE LIVE 19400 UNT/0.65ML ~~LOC~~ SUSR: 0.6500 mL | Freq: Once | SUBCUTANEOUS | 0 refills | Status: AC

## 2016-04-04 MED ORDER — GABAPENTIN 300 MG PO CAPS: 300.0000 mg | ORAL_CAPSULE | Freq: Two times a day (BID) | ORAL | 6 refills | Status: DC

## 2016-04-04 NOTE — Progress Notes (Addendum)
Patient ID: Kaitlyn Wood, female   DOB: 1950-12-13, 66 y.o.   MRN: [id number]   Location:  [facility]  Place of Service:  OFFICE  Provider: Arletha Grippe, DO  Patient Care Team: Gildardo Cranker, DO as PCP - General (Internal Medicine) Brien Few, MD as Consulting Physician (Obstetrics and Gynecology)  Extended Emergency Contact Information Primary Emergency Contact: [place], [place] Phone: [phone] Relation: Son  Code Status: FULL CODE Goals of Care: Advanced Directive information Advanced Directives 04/04/2016  Does Patient Have a Medical Advance Directive? No  Would patient like information on creating a medical advance directive? No - Patient declined     Chief Complaint  Patient presents with  . Annual Exam    Extended Visit  . Flu Vaccine    requested  . MMSE    29/30 passed clock drawing    HPI: Patient is a 66 y.o. female seen in today for an annual wellness exam.  She reports feeling anxious today due to not sleeping well last night. She is a poor historian due to psych d/o. Hx obtained from chart  She relocated from [place], [place] in June 2017.  Urinary incontinence - urgency. She was told by GYN that she has prolapsed uterus. Improved overall.  Bipolar d/o/anxiety/insomnia/PTSD - she is followed by psych. Current provider is Dr Casimiro Needle. She is taking wellbutrin sr, klonopin, neurontin, lamictal, latuda and trazodone. She is off vyvanse due to cost. She had pt assistance in the past and is awaiting approval. Her therapist is [facility]. She states she is in a hypomanic state today  Hyperlipidemia - she reports intolerance to statins as they caused "my urine to turn brown". She is hesitant to try any other medications. She states LDL was 180 in the past.  Current LDL 151; Tchol 239; HDL 60  DM - she takes metformin daily. BS are normal range and no low BS reactions. Last A1c 6%. She has not had any  numbness/tingling. She saw eye Dr in May and was told no diabetic changes. Urine microalbumin/creatinine ratio 5. Cr 1.04  Cervical/lumbar herniated/bulging discs - C2-T4; she reports no significant pain at this time. She has never had sx. Initial injury was falling off porch with whiplash injury many years ago.  FHx cancer - Mother with ovarian cancer; sister/MGM with colorectal cancer; Father with Lymphoma  Hep C nonreactive  Depression screen PHQ 2/9 01/04/2016  Decreased Interest 1  Down, Depressed, Hopeless 1  PHQ - 2 Score 2    Fall Risk  04/04/2016 01/04/2016  Falls in the past year? Yes Yes  Number falls in past yr: 2 or more 2 or more  Injury with Fall? Yes No   MMSE - Mini Mental State Exam 04/04/2016  Orientation to time 5  Orientation to Place 5  Registration 3  Attention/ Calculation 5  Recall 2  Language- name 2 objects 2  Language- repeat 1  Language- follow 3 step command 3  Language- read & follow direction 1  Write a sentence 1  Copy design 1  Total score 29     Health Maintenance  Topic Date Due  . FOOT EXAM  05/01/1959  . OPHTHALMOLOGY EXAM  05/01/1959  . COLONOSCOPY  05/01/1999  . ZOSTAVAX  04/30/2009  . DEXA SCAN  04/30/2014  . INFLUENZA VACCINE  10/30/2015  . HEMOGLOBIN A1C  07/04/2016  . URINE MICROALBUMIN  01/03/2017  . PNA  vac Low Risk Adult (2 of 2 - PPSV23) 01/03/2017  . MAMMOGRAM  12/03/2017  . TETANUS/TDAP  09/28/2025  . Hepatitis C Screening  Completed    Urinary incontinence? Yes. Stress incontinence  Functional Status Survey: Is the patient deaf or have difficulty hearing?: No Does the patient have difficulty seeing, even when wearing glasses/contacts?: Yes (double vision; has blind spot in OD; followed by eye specialist) Does the patient have difficulty concentrating, remembering, or making decisions?: Yes (diffiuclty staying focused off vyvanse due to cost) Does the patient have difficulty walking or climbing stairs?: No Does the  patient have difficulty dressing or bathing?: No Does the patient have difficulty doing errands alone such as visiting a doctor's office or shopping?: No  Exercise? Has increased in the new year  Diet? Maintains healthy food choices  Vision Screening Comments: Last visit eye doctor in April 2017  Hearing: no issues    Dentition: she wears dentures. She does not have a dentist in the area yet  Pain: especially in back and knee  Past Medical History:  Diagnosis Date  . Anxiety   . Bipolar 1 disorder ([place])   . Bulging of cervical intervertebral disc    C2  . Bulging of thoracic intervertebral disc    t4  . Depression   . Diabetes mellitus without complication ([place])   . Hyperlipidemia   . PTSD (post-traumatic stress disorder)     Past Surgical History:  Procedure Laterality Date  . HEMICOLECTOMY  1997  . JOINT REPLACEMENT  2013   hip    Family History  Problem Relation Age of Onset  . Ovarian cancer Mother   . Lymphoma Father   . Colon cancer Sister    Family Status  Relation Status  . Mother Deceased  . Father Deceased  . Sister Deceased at age 15  . Sister Alive  . Son Alive  . Daughter Alive  . Son Alive  . Son Deceased at age 40   Heroin/clonazipan overdose    shoulder  Social History   Social History  . Marital status: Divorced    Spouse name: N/A  . Number of children: N/A  . Years of education: N/A   Occupational History  . Not on file.   Social History Main Topics  . Smoking status: Never Smoker  . Smokeless tobacco: Never Used  . Alcohol use No  . Drug use: No  . Sexual activity: Not on file   Other Topics Concern  . Not on file   Social History Narrative   Diet? ? Fresh food- very little prepared food- no fast food.       Do you drink/eat things with caffeine?  Yes      Marital status?         Divorced                           What year were you married? 1978      Do you live in a house, apartment, assisted living, condo,  trailer, etc.? apartment      Is it one or more stories? Yes      How many persons live in your home? 1      Do you have any pets in your home? (please list)  1 cat, Polly      Current or past profession: Air traffic controller, sales       Do you exercise?  yes                               Type & how often? 2-3 times a week      Do you have a living will? No      Do you have a DNR form?    no                              If not, do you want to discuss one? yes      Do you have signed POA/HPOA for forms? no    Allergies  Allergen Reactions  . Oxycodone     Migraines  . Statins     "makes my urine turn brown"    Allergies as of 04/04/2016      Reactions   Oxycodone    Migraines   Statins    "makes my urine turn brown"      Medication List       Accurate as of 04/04/16  3:39 PM. Always use your most recent med list.          ALPHA LIPOIC ACID PO Take by mouth daily.   buPROPion 150 MG 12 hr tablet Commonly known as:  WELLBUTRIN SR Take 150 mg by mouth 2 (two) times daily.   clonazePAM 1 MG tablet Commonly known as:  KLONOPIN Take 0.5-1 mg by mouth 2 (two) times daily as needed.   gabapentin 300 MG capsule Commonly known as:  NEURONTIN Take 1 capsule (300 mg total) by mouth 2 (two) times daily.   glucose blood test strip One Touch Ultra test Strips Use to check blood sugar once daily. Dx E11.9   lamoTRIgine 200 MG tablet Commonly known as:  LAMICTAL Take 200 mg by mouth 2 (two) times daily.   lisdexamfetamine 40 MG capsule Commonly known as:  VYVANSE Take 40 mg by mouth every morning.   lurasidone 40 MG Tabs tablet Commonly known as:  LATUDA Take 40 mg by mouth daily with breakfast.   metFORMIN 500 MG tablet Commonly known as:  GLUCOPHAGE Take 500 mg by mouth daily.   ONE TOUCH LANCETS Misc Use to test blood sugars once daily DX: E 11.9   ONE TOUCH ULTRA SYSTEM KIT w/Device Kit Use to test blood sugar once daily. Dx: E11.9   PROLINE  PO Take by mouth daily.   traZODone 50 MG tablet Commonly known as:  DESYREL Take 25 mg by mouth at bedtime as needed.   vitamin C 1000 MG tablet Take 1,000 mg by mouth daily.   VITAMIN D PO Take 300 mg by mouth daily.        Review of Systems:  Review of Systems  Unable to perform ROS: Psychiatric disorder    Physical Exam: Vitals:   04/04/16 1458  BP: (!) 162/86  Pulse: (!) 106  Temp: 98.1 F (36.7 C)  TempSrc: Oral  SpO2: 97%  Weight: 189 lb 12.8 oz (86.1 kg)  Height: '5\' 3"'  (1.6 m)   Body mass index is 33.62 kg/m. Physical Exam  Constitutional: She is oriented to person, place, and time. She appears well-developed and well-nourished. No distress.  HENT:  Head: Normocephalic and atraumatic.  Right Ear: Hearing, tympanic membrane, external ear and ear canal normal.  Left Ear: Hearing, tympanic membrane, external ear and ear canal normal.  Mouth/Throat: Uvula is midline, oropharynx is clear  and moist and mucous membranes are normal. She does not have dentures. No oropharyngeal exudate.  Eyes: Conjunctivae, EOM and lids are normal. Pupils are equal, round, and reactive to light. No scleral icterus.  Neck: Trachea normal and normal range of motion. Neck supple. Carotid bruit is not present. No tracheal deviation present. No thyroid mass and no thyromegaly present.  Cardiovascular: Normal rate, regular rhythm, normal heart sounds and intact distal pulses.  Exam reveals no gallop and no friction rub.   No murmur heard. Trace LE edema b/l. no calf TTP. Multiple spider veins  Pulmonary/Chest: Effort normal and breath sounds normal. No stridor. No respiratory distress. She has no wheezes. She has no rhonchi. She has no rales. Right breast exhibits no inverted nipple, no mass, no nipple discharge, no skin change and no tenderness. Left breast exhibits no inverted nipple, no mass, no nipple discharge, no skin change and no tenderness. Breasts are symmetrical.    Abdominal:  Soft. Normal appearance, normal aorta and bowel sounds are normal. She exhibits no distension, no pulsatile midline mass and no mass. There is no hepatosplenomegaly or hepatomegaly. There is no tenderness. There is no rigidity, no rebound and no guarding. No hernia.  Musculoskeletal: Normal range of motion. She exhibits edema (right knee).  Lymphadenopathy:       Head (right side): No posterior auricular adenopathy present.       Head (left side): No posterior auricular adenopathy present.    She has no cervical adenopathy.       Right: No supraclavicular adenopathy present.       Left: No supraclavicular adenopathy present.  Neurological: She is alert and oriented to person, place, and time. She has normal strength and normal reflexes. No cranial nerve deficit. Gait normal.  Skin: Skin is warm, dry and intact. No rash noted. Nails show no clubbing.  Psychiatric: Her behavior is normal. Thought content normal. Her mood appears anxious. Her speech is tangential. Cognition and memory are normal.    Labs reviewed:  Basic Metabolic Panel:  Recent Labs  10/09/15 0457 01/04/16 1001  NA 140 137  K 3.6 4.5  CL 109 100  CO2 23 27  GLUCOSE 129* 137*  BUN 21* 23  CREATININE 0.82 1.04*  CALCIUM 8.6* 9.5  TSH  --  1.61   Liver Function Tests:  Recent Labs  01/04/16 1001  AST 20  ALT 13  ALKPHOS 81  BILITOT 0.4  PROT 6.7  ALBUMIN 4.4   No results for input(s): LIPASE, AMYLASE in the last 8760 hours. No results for input(s): AMMONIA in the last 8760 hours. CBC:  Recent Labs  10/09/15 0457 01/04/16 1001  WBC 5.8 6.0  NEUTROABS 3.9 4,440  HGB 11.1* 13.9  HCT 33.5* 41.6  MCV 87.5 88.3  PLT 179 231   Lipid Panel:  Recent Labs  01/04/16 1001  CHOL 239*  HDL 60  LDLCALC 151*  TRIG 139  CHOLHDL 4.0   Lab Results  Component Value Date   HGBA1C 6.0 (H) 01/04/2016    Procedures: No results found. ECG OBTAINED AND REVIEWED BY MYSELF:  NSR @ 90 bpm, nml axis, poor R  wave progression, baseline artifact. No acute ischemic changes; no other ECG available to compare  Assessment/Plan   ICD-9-CM ICD-10-CM   1. Initial Medicare annual wellness visit V70.0 Z00.00   2. Insomnia, unspecified type 780.52 G47.00   3. Chronic pain of right knee 719.46 M25.561    338.29 G89.29   4. Type 2  diabetes mellitus without complication, without long-term current use of insulin (HCC) 250.00 E11.9 metFORMIN (GLUCOPHAGE) 500 MG tablet  5. Mixed hyperlipidemia 272.2 E78.2   6. High risk medication use V58.69 Z79.899 EKG 12-Lead  7. Urge incontinence of urine 788.31 N39.41   8. Elevated BP without diagnosis of hypertension 796.2 R03.0   9. Colon cancer screening V76.51 Z12.11 Ambulatory referral to Gastroenterology  10. Encounter for immunization Z23 Z23 Flu Vaccine QUAD 36+ mos IM     Pt is UTD on health maintenance. Vaccinations are UTD. Pt maintains a healthy lifestyle. Encouraged pt to exercise 30-45 minutes 4-5 times per week. Eat a well balanced diet. Avoid smoking. Limit alcohol intake. Wear seatbelt when riding in the car. Wear sun block (SPF >50) when spending extended times outside.  Declined DXA scan  Continue current medications as ordered  Will call with GI referral for colonoscopy  Flu shot given today  Will need prevnar vaccine in Oct 2018  Handicap placard given today  Follow up in 3 mos for routine visit. Labs day of appt  Cordella Register. Perlie Gold  [facility] [address] [place], [place] [address] [phone] Cell (Monday-Friday 8 AM - 5 PM) [phone] After 5 PM and follow prompts

## 2016-04-04 NOTE — Patient Instructions (Addendum)
Encouraged her to exercise 30-45 minutes 4-5 times per week. Eat a well balanced diet. Avoid smoking. Limit alcohol intake. Wear seatbelt when riding in the car. Wear sun block (SPF >50) when spending extended times outside.  Continue current medications as ordered  Will call with GI referral for colonoscopy  Flu shot given today  Will need prevnar vaccine in Oct 2018  Handicap placard given today  Follow up in 3 mos for routine visit. Labs day of appt

## 2016-04-06 DIAGNOSIS — Z79899 Other long term (current) drug therapy: Secondary | ICD-10-CM | POA: Insufficient documentation

## 2016-04-06 DIAGNOSIS — G4700 Insomnia, unspecified: Secondary | ICD-10-CM | POA: Insufficient documentation

## 2016-04-06 DIAGNOSIS — E782 Mixed hyperlipidemia: Secondary | ICD-10-CM | POA: Insufficient documentation

## 2016-04-06 DIAGNOSIS — M25561 Pain in right knee: Secondary | ICD-10-CM

## 2016-04-06 DIAGNOSIS — G8929 Other chronic pain: Secondary | ICD-10-CM | POA: Insufficient documentation

## 2016-04-06 DIAGNOSIS — R030 Elevated blood-pressure reading, without diagnosis of hypertension: Secondary | ICD-10-CM | POA: Insufficient documentation

## 2016-04-06 DIAGNOSIS — N3941 Urge incontinence: Secondary | ICD-10-CM | POA: Insufficient documentation

## 2016-04-06 DIAGNOSIS — E119 Type 2 diabetes mellitus without complications: Secondary | ICD-10-CM | POA: Insufficient documentation

## 2016-04-24 DIAGNOSIS — F3131 Bipolar disorder, current episode depressed, mild: Secondary | ICD-10-CM | POA: Diagnosis not present

## 2016-05-09 ENCOUNTER — Telehealth: Payer: Self-pay | Admitting: *Deleted

## 2016-05-09 DIAGNOSIS — E119 Type 2 diabetes mellitus without complications: Secondary | ICD-10-CM

## 2016-05-09 MED ORDER — METFORMIN HCL 500 MG PO TABS: 500.0000 mg | ORAL_TABLET | Freq: Two times a day (BID) | ORAL | 4 refills | Status: DC

## 2016-05-09 NOTE — Telephone Encounter (Signed)
Patient notified and agreed. Rx faxed to pharmacy.  

## 2016-05-09 NOTE — Telephone Encounter (Signed)
Patient called and stated that her blood sugar is "out of whack" Stated that she took an extra Metformin and it dropped it from 260 to 140. She wants to know if it is ok to increase her Metformin. Please Advise.   05/09/16- 159 am 05/08/16- 240 am (took extra Metformin) 05/08/16- 140 pm 05/01/16- 164 am 05/01/16- 143 pm 05/02/16- 145 am 05/04/16- 260 am 05/04/16- 238 pm 05/05/16- 174 pm 05/06/16- 177 am 05/06/16- 140 pm

## 2016-05-09 NOTE — Telephone Encounter (Signed)
Ok to increase metformin 500mg  #60 take 1 po BID with 4 RF; f/u as scheduled

## 2016-05-14 ENCOUNTER — Telehealth: Payer: Self-pay

## 2016-05-14 NOTE — Telephone Encounter (Signed)
Left message for patient to return call to office. Per Dr. Eulas Post she can only justify checking her blood sugars BID because she is not insulin dependent.

## 2016-05-20 ENCOUNTER — Other Ambulatory Visit: Payer: Self-pay

## 2016-05-20 MED ORDER — GLUCOSE BLOOD VI STRP: ORAL_STRIP | 3 refills | Status: DC

## 2016-05-20 MED ORDER — ONETOUCH LANCETS MISC: 12 refills | Status: DC

## 2016-05-20 NOTE — Telephone Encounter (Signed)
Spoke with patient and notified her of above message.

## 2016-05-27 DIAGNOSIS — F3131 Bipolar disorder, current episode depressed, mild: Secondary | ICD-10-CM | POA: Diagnosis not present

## 2016-05-30 ENCOUNTER — Telehealth: Payer: Self-pay | Admitting: *Deleted

## 2016-05-30 NOTE — Telephone Encounter (Signed)
Patient notified and agreed to go to Urgent Care.

## 2016-05-30 NOTE — Telephone Encounter (Signed)
Recommend she go to UC to be evaluated properly. If she refuses, recommend BRAT diet and advance as tolerated

## 2016-05-30 NOTE — Telephone Encounter (Signed)
Patient called and stated that she thinks she has had Food Poisoning. Stabbing Abdominal Pain and last night had Mucus discharge with blood form anus. No nausea or vomiting. Stomach was tender upon touching. No fever. Feels like it is something she ate. Has been going on the last few day, does feel better today but not great. Is there anything she can take.  Please Advise.

## 2016-05-31 ENCOUNTER — Ambulatory Visit (HOSPITAL_COMMUNITY)
Admission: EM | Admit: 2016-05-31 | Discharge: 2016-05-31 | Disposition: A | Discharge status: Home / Self Care | Payer: Medicare Other | Attending: Internal Medicine | Admitting: Internal Medicine

## 2016-05-31 ENCOUNTER — Encounter (HOSPITAL_COMMUNITY): Payer: Self-pay | Admitting: Family Medicine

## 2016-05-31 DIAGNOSIS — K2901 Acute gastritis with bleeding: Secondary | ICD-10-CM | POA: Diagnosis not present

## 2016-05-31 DIAGNOSIS — A09 Infectious gastroenteritis and colitis, unspecified: Secondary | ICD-10-CM | POA: Diagnosis not present

## 2016-05-31 DIAGNOSIS — R197 Diarrhea, unspecified: Secondary | ICD-10-CM

## 2016-05-31 MED ORDER — SOLUBLE FIBER/PROBIOTICS PO CHEW: 1.0000 | CHEWABLE_TABLET | Freq: Every day | ORAL | 0 refills | Status: DC

## 2016-05-31 NOTE — ED Triage Notes (Signed)
Pt here for abd cramping and blood in stool after eating some bad meat. sts that it was a mucous stool and had streaks of blood in it.

## 2016-05-31 NOTE — Discharge Instructions (Signed)
The diarrhea is most likely due to residual toxins from your food. Antibiotics most likely will not help this. I recommend a clear liquid diet for the next states 2 days such as beef broth, chicken broth, vegetable broth, Jell-O, etc. Advance to simple solid foods such as bananas, rice, or toast. She her symptoms persist, or fail to improve, follow up with her primary care provider, or return to clinic. If he develops signs or symptoms of dehydration, such as lightheadedness, palpitations, dry mouth, lethargy, or other such symptoms, consider going to the emergency room.

## 2016-05-31 NOTE — ED Provider Notes (Signed)
CSN: [id number]     Arrival date & time 05/31/16  1828 History   None    Chief Complaint  Patient presents with  . Rectal Bleeding   (Consider location/radiation/quality/duration/timing/severity/associated sxs/prior Treatment) 66 year old female presents to clinic with a chief complaint of stomach pain, diarrhea, with some blood streaking. She states that Thursday night she ate some old, out of date meat. She said she cooked it thoroughly, however she is concerned that there may have been additional bacteria present. She reports she's had 3-4 loose stools today, then her symptoms have been improving. She reports having 5-6 loose stools yesterday, describing them as dark, foul-smelling, with mucus, and red streaking. She has had no recent history of antibiotics, she does not have her appendix, she does still have her gallbladder. She denies fever, denies nausea, and denies vomiting.   The history is provided by the patient.    Past Medical History:  Diagnosis Date  . Anxiety   . Bipolar 1 disorder ([place])   . Bulging of cervical intervertebral disc    C2  . Bulging of thoracic intervertebral disc    t4  . Depression   . Diabetes mellitus without complication ([place])   . Hyperlipidemia   . PTSD (post-traumatic stress disorder)    Past Surgical History:  Procedure Laterality Date  . HEMICOLECTOMY  1997  . JOINT REPLACEMENT  2013   hip   Family History  Problem Relation Age of Onset  . Ovarian cancer Mother   . Lymphoma Father   . Colon cancer Sister    Social History  Substance Use Topics  . Smoking status: Never Smoker  . Smokeless tobacco: Never Used  . Alcohol use No   OB History    No data available     Review of Systems  Reason unable to perform ROS: As covered in history of present illness.  All other systems reviewed and are negative.   Allergies  Oxycodone and Statins  Home Medications   Prior to Admission medications   Medication Sig Start Date End Date  Taking? Authorizing Provider  ALPHA LIPOIC ACID PO Take by mouth daily.    Historical Provider, MD  Ascorbic Acid (VITAMIN C) 1000 MG tablet Take 1,000 mg by mouth daily.    Historical Provider, MD  Blood Glucose Monitoring Suppl (ONE TOUCH ULTRA SYSTEM KIT) w/Device KIT Use to test blood sugar once daily. Dx: E11.9 01/08/16   Gildardo Cranker, DO  buPROPion (WELLBUTRIN SR) 150 MG 12 hr tablet Take 150 mg by mouth 2 (two) times daily.    Historical Provider, MD  Cholecalciferol (VITAMIN D PO) Take 300 mg by mouth daily.    Historical Provider, MD  clonazePAM (KLONOPIN) 1 MG tablet Take 0.5-1 mg by mouth 2 (two) times daily as needed.     Historical Provider, MD  gabapentin (NEURONTIN) 300 MG capsule Take 1 capsule (300 mg total) by mouth 2 (two) times daily. 04/04/16   Gildardo Cranker, DO  glucose blood test strip One Touch Ultra test Strips Use to check blood sugar twice daily. Dx E11.9 05/20/16   Gildardo Cranker, DO  lamoTRIgine (LAMICTAL) 200 MG tablet Take 200 mg by mouth 2 (two) times daily.     Historical Provider, MD  lisdexamfetamine (VYVANSE) 40 MG capsule Take 40 mg by mouth every morning.    Historical Provider, MD  lurasidone (LATUDA) 40 MG TABS tablet Take 40 mg by mouth daily with breakfast.    Historical Provider, MD  metFORMIN (GLUCOPHAGE) 500  MG tablet Take 1 tablet (500 mg total) by mouth 2 (two) times daily with a meal. 05/09/16   Gildardo Cranker, DO  ONE TOUCH LANCETS MISC Use to test blood sugars twice daily DX: E 11.9 05/20/16   Gildardo Cranker, DO  Probiotic Product (SOLUBLE FIBER/PROBIOTICS) CHEW Chew 1 tablet by mouth daily. 05/31/16   Barnet Glasgow, NP  PROLINE PO Take by mouth daily.    Historical Provider, MD  traZODone (DESYREL) 50 MG tablet Take 25 mg by mouth at bedtime as needed.     Historical Provider, MD   Meds Ordered and Administered this Visit  Medications - No data to display  BP 145/92   Pulse 80   Temp 98.6 F (37 C)   Resp 18   SpO2 98%  No data  found.   Physical Exam  Constitutional: She is oriented to person, place, and time. She appears well-developed and well-nourished. No distress.  HENT:  Head: Normocephalic and atraumatic.  Cardiovascular: Normal rate and regular rhythm.   Pulmonary/Chest: Effort normal and breath sounds normal.  Abdominal: Soft. Bowel sounds are normal. There is tenderness in the left lower quadrant. There is no rigidity, no rebound, no guarding, no CVA tenderness, no tenderness at McBurney's point and negative Murphy's sign.  Neurological: She is alert and oriented to person, place, and time.  Skin: Skin is warm and dry. Capillary refill takes less than 2 seconds. She is not diaphoretic.  Psychiatric: She has a normal mood and affect.  Nursing note and vitals reviewed.   Urgent Care Course     Procedures (including critical care time)  Labs Review Labs Reviewed - No data to display  Imaging Review No results found.      MDM   1. Other acute gastritis with hemorrhage   2. Diarrhea of presumed infectious origin    The diarrhea is most likely due to residual toxins from your food. Antibiotics most likely will not help this. I recommend a clear liquid diet for the next states 2 days such as beef broth, chicken broth, vegetable broth, Jell-O, etc. Advance to simple solid foods such as bananas, rice, or toast. She her symptoms persist, or fail to improve, follow up with her primary care provider, or return to clinic. If he develops signs or symptoms of dehydration, such as lightheadedness, palpitations, dry mouth, lethargy, or other such symptoms, consider going to the emergency room.      Barnet Glasgow, NP 05/31/16 [id number]

## 2016-06-02 ENCOUNTER — Other Ambulatory Visit: Payer: Self-pay | Admitting: *Deleted

## 2016-06-02 MED ORDER — GLUCOSE BLOOD VI STRP: ORAL_STRIP | 11 refills | Status: DC

## 2016-06-02 NOTE — Telephone Encounter (Signed)
[employer]

## 2016-06-11 ENCOUNTER — Telehealth: Payer: Self-pay | Admitting: *Deleted

## 2016-06-11 NOTE — Telephone Encounter (Signed)
Patient called back and stated that she is running a low grade fever. Patient will not go to [employer] job with a fever. Patient does not want to come in for appointment. Will get rest and plenty of fluids and call if any new symptoms.

## 2016-06-11 NOTE — Telephone Encounter (Signed)
Patient called yesterday after 4:30 and left message on Clinical Intake line stating that she had mild flu like symptoms. She stated that she feels like she shouldn't drive but does not feel like it needs medical attention. 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 Patient stated that she has had the flu shot.   Tried calling patient back and LMOM to return call.

## 2016-06-20 NOTE — Telephone Encounter (Signed)
Patient called in stated that she had more symptoms of the flu ear pain tremors can't walk straight muscle pain sore throat and fever. No available appt for today recommended Urgent care patient agreed.

## 2016-06-30 ENCOUNTER — Encounter: Payer: Self-pay | Admitting: Internal Medicine

## 2016-09-03 ENCOUNTER — Encounter (HOSPITAL_COMMUNITY): Payer: Self-pay | Admitting: Emergency Medicine

## 2016-09-03 ENCOUNTER — Emergency Department (HOSPITAL_COMMUNITY)
Admission: EM | Admit: 2016-09-03 | Discharge: 2016-09-03 | Disposition: A | Discharge status: Home / Self Care | Payer: Medicare Other | Attending: Emergency Medicine | Admitting: Emergency Medicine

## 2016-09-03 ENCOUNTER — Emergency Department (HOSPITAL_COMMUNITY): Payer: Medicare Other

## 2016-09-03 DIAGNOSIS — M25551 Pain in right hip: Secondary | ICD-10-CM | POA: Diagnosis not present

## 2016-09-03 DIAGNOSIS — Z79899 Other long term (current) drug therapy: Secondary | ICD-10-CM | POA: Diagnosis not present

## 2016-09-03 DIAGNOSIS — Y939 Activity, unspecified: Secondary | ICD-10-CM | POA: Insufficient documentation

## 2016-09-03 DIAGNOSIS — R51 Headache: Secondary | ICD-10-CM | POA: Diagnosis not present

## 2016-09-03 DIAGNOSIS — S0993XA Unspecified injury of face, initial encounter: Secondary | ICD-10-CM | POA: Diagnosis not present

## 2016-09-03 DIAGNOSIS — M25561 Pain in right knee: Secondary | ICD-10-CM | POA: Diagnosis not present

## 2016-09-03 DIAGNOSIS — Y999 Unspecified external cause status: Secondary | ICD-10-CM | POA: Diagnosis not present

## 2016-09-03 DIAGNOSIS — M7918 Myalgia, other site: Secondary | ICD-10-CM

## 2016-09-03 DIAGNOSIS — M791 Myalgia: Secondary | ICD-10-CM | POA: Diagnosis not present

## 2016-09-03 DIAGNOSIS — S0101XA Laceration without foreign body of scalp, initial encounter: Secondary | ICD-10-CM | POA: Diagnosis not present

## 2016-09-03 DIAGNOSIS — W228XXA Striking against or struck by other objects, initial encounter: Secondary | ICD-10-CM | POA: Diagnosis not present

## 2016-09-03 DIAGNOSIS — M542 Cervicalgia: Secondary | ICD-10-CM | POA: Diagnosis not present

## 2016-09-03 DIAGNOSIS — Z7984 Long term (current) use of oral hypoglycemic drugs: Secondary | ICD-10-CM | POA: Diagnosis not present

## 2016-09-03 DIAGNOSIS — G8911 Acute pain due to trauma: Secondary | ICD-10-CM | POA: Diagnosis not present

## 2016-09-03 DIAGNOSIS — Y929 Unspecified place or not applicable: Secondary | ICD-10-CM | POA: Diagnosis not present

## 2016-09-03 DIAGNOSIS — S0990XA Unspecified injury of head, initial encounter: Secondary | ICD-10-CM | POA: Diagnosis not present

## 2016-09-03 DIAGNOSIS — S79911A Unspecified injury of right hip, initial encounter: Secondary | ICD-10-CM | POA: Diagnosis not present

## 2016-09-03 DIAGNOSIS — M25511 Pain in right shoulder: Secondary | ICD-10-CM | POA: Diagnosis not present

## 2016-09-03 DIAGNOSIS — E119 Type 2 diabetes mellitus without complications: Secondary | ICD-10-CM | POA: Insufficient documentation

## 2016-09-03 LAB — BASIC METABOLIC PANEL
Anion gap: 8 (ref 5–15)
BUN: 14 mg/dL (ref 6–20)
CHLORIDE: 105 mmol/L (ref 101–111)
CO2: 25 mmol/L (ref 22–32)
Calcium: 8.9 mg/dL (ref 8.9–10.3)
Creatinine, Ser: 0.88 mg/dL (ref 0.44–1.00)
GFR calc Af Amer: >60 mL/min (ref >60)
GFR calc non Af Amer: >60 mL/min (ref >60)
GLUCOSE: 190 mg/dL — AB (ref 65–99)
POTASSIUM: 4.3 mmol/L (ref 3.5–5.1)
Sodium: 138 mmol/L (ref 135–145)

## 2016-09-03 LAB — CBC
HCT: 37.4 % (ref 36.0–46.0)
Hemoglobin: 12.2 g/dL (ref 12.0–15.0)
MCH: 29 pg (ref 26.0–34.0)
MCHC: 32.6 g/dL (ref 30.0–36.0)
MCV: 88.8 fL (ref 78.0–100.0)
Platelets: 199 10*3/uL (ref 150–400)
RBC: 4.21 MIL/uL (ref 3.87–5.11)
RDW: 13.5 % (ref 11.5–15.5)
WBC: 5.6 10*3/uL (ref 4.0–10.5)

## 2016-09-03 LAB — CBG MONITORING, ED: Glucose-Capillary: 120 mg/dL — ABNORMAL HIGH (ref 65–99)

## 2016-09-03 MED ORDER — LIDOCAINE-EPINEPHRINE (PF) 2 %-1:200000 IJ SOLN
10.0000 mL | Freq: Once | INTRAMUSCULAR | Status: AC
  Administered 2016-09-03: 10 mL
  Filled 2016-09-03: qty 20

## 2016-09-03 MED ORDER — IBUPROFEN 600 MG PO TABS: 600.0000 mg | ORAL_TABLET | Freq: Four times a day (QID) | ORAL | 0 refills | Status: DC | PRN

## 2016-09-03 MED ORDER — KETOROLAC TROMETHAMINE 30 MG/ML IJ SOLN
30.0000 mg | Freq: Once | INTRAMUSCULAR | Status: AC
  Administered 2016-09-03: 30 mg via INTRAMUSCULAR
  Filled 2016-09-03: qty 1

## 2016-09-03 MED ORDER — METHOCARBAMOL 500 MG PO TABS: 500.0000 mg | ORAL_TABLET | Freq: Two times a day (BID) | ORAL | 0 refills | Status: DC

## 2016-09-03 NOTE — Discharge Instructions (Signed)
Please read and follow all provided instructions.  Your diagnoses today include:  1. Laceration of scalp, initial encounter   2. Musculoskeletal pain     Tests performed today include: X-ray of the affected area that did not show any foreign bodies or broken bones Vital signs. See below for your results today.   Medications prescribed:   Take any prescribed medications only as directed.   Home care instructions:  Follow any educational materials and wound care instructions contained in this packet.   You may shower and wash the area with soap and water, just be sure to pat the area dry and not rub over the stitches. Do no put your stiches underwater (in a bath, pool, or lake). Getting stiches wet can slow down healing and increase your chances of getting an infection. You may apply Bacitracin or Neosporin twice a day for 7 days, and keep the ara clean with  bandage or gauze. Do not apply alcohol or hydrogen peroxide. Cover the area if it draining or weeping.   Follow-up instructions: Suture Removal: Return to the Emergency Department or see your primary care care doctor in 5-8 days for a recheck of your wound and removal of your sutures or staples.    Return instructions:  Return to the Emergency Department if you have: Fever Worsening pain Worsening swelling of the wound Pus draining from the wound Redness of the skin that moves away from the wound, especially if it streaks away from the affected area  Any other emergent concerns  Your vital signs today were: BP 122/84    Pulse 99    Temp 98.5 F (36.9 C) (Oral)    Resp 18    Ht 5\' 3"  (1.6 m)    Wt 83.9 kg (185 lb)    SpO2 96%    BMI 32.77 kg/m  If your blood pressure (BP) was elevated above 135/85 this visit, please have this repeated by your doctor within one month. --------------

## 2016-09-03 NOTE — ED Notes (Signed)
Suture cart at bedside 

## 2016-09-03 NOTE — Progress Notes (Signed)
Orthopedic Tech Progress Note Patient Details:  Kaitlyn Wood 1950-04-03 [id number]  Ortho Devices Type of Ortho Device: Arm sling Ortho Device/Splint Location: rue Ortho Device/Splint Interventions: Application   Hildred Priest 09/03/2016, 12:58 PM

## 2016-09-03 NOTE — ED Triage Notes (Signed)
Per ptar, pt had mechanical fall while walking up steps, hit head on step. Denies LOC, c/o neck and head pain, pt has 2 in lac on hairline. C collar in place. VSS, AAOX4.

## 2016-09-03 NOTE — ED Provider Notes (Signed)
[facility] DEPT Provider Note   CSN: [id number] Arrival date & time: 09/03/16  1003     History   Chief Complaint Chief Complaint  Patient presents with  . Fall  . Head Laceration    HPI Kaitlyn Wood is a 66 y.o. female.  HPI  66 y.o. female, presents to the Emergency Department today due to mechanical fall walking up stairs. Noted hitting head on step after presumably hitting step and falling forward. Denies LOC. Notes head and neck pain as well as 2 inch laceration to forehead. Bleeding controlled. Notes pain 5/10 on forehead. Aching sensation. Denies visual changes. No N/V. No CP/SOB/ABD pain. Notes right shoulder pain. No fevers. No URI symptoms. No other symptoms noted. Of note, pt states that she as been falling  over the past year. Seeing PCP for this. Pt does not take blood thinners.   Past Medical History:  Diagnosis Date  . Anxiety   . Bipolar 1 disorder ([place])   . Bulging of cervical intervertebral disc    C2  . Bulging of thoracic intervertebral disc    t4  . Depression   . Diabetes mellitus without complication ([place])   . Hyperlipidemia   . PTSD (post-traumatic stress disorder)     Patient Active Problem List   Diagnosis Date Noted  . Insomnia 04/06/2016  . Chronic pain of right knee 04/06/2016  . Type 2 diabetes mellitus without complication, without long-term current use of insulin ([place]) 04/06/2016  . Mixed hyperlipidemia 04/06/2016  . High risk medication use 04/06/2016  . Urge incontinence of urine 04/06/2016  . Elevated BP without diagnosis of hypertension 04/06/2016    Past Surgical History:  Procedure Laterality Date  . HEMICOLECTOMY  1997  . JOINT REPLACEMENT  2013   hip    OB History    No data available       Home Medications    Prior to Admission medications   Medication Sig Start Date End Date Taking? Authorizing Provider  ALPHA LIPOIC ACID PO Take by mouth daily.    [provider]  Ascorbic Acid (VITAMIN C) 1000 MG  tablet Take 1,000 mg by mouth daily.    [provider]  Blood Glucose Monitoring Suppl (ONE TOUCH ULTRA SYSTEM KIT) w/Device KIT Use to test blood sugar once daily. Dx: E11.9 01/08/16   Gildardo Cranker, DO  buPROPion [facility] SR) 150 MG 12 hr tablet Take 150 mg by mouth 2 (two) times daily.    [provider]  Cholecalciferol (VITAMIN D PO) Take 300 mg by mouth daily.    [provider]  clonazePAM (KLONOPIN) 1 MG tablet Take 0.5-1 mg by mouth 2 (two) times daily as needed.     [provider]  gabapentin (NEURONTIN) 300 MG capsule Take 1 capsule (300 mg total) by mouth 2 (two) times daily. 04/04/16   Gildardo Cranker, DO  glucose blood test strip One Touch Ultra test Strips Use to check blood up to three times daily. Dx E11.9 06/02/16   Gildardo Cranker, DO  lamoTRIgine (LAMICTAL) 200 MG tablet Take 200 mg by mouth 2 (two) times daily.     [provider]  lisdexamfetamine (VYVANSE) 40 MG capsule Take 40 mg by mouth every morning.    [provider]  lurasidone (LATUDA) 40 MG TABS tablet Take 40 mg by mouth daily with breakfast.    [provider]  metFORMIN (GLUCOPHAGE) 500 MG tablet Take 1 tablet (500 mg total) by mouth 2 (two) times daily  with a meal. 05/09/16   Gildardo Cranker, DO  ONE TOUCH LANCETS MISC Use to test blood sugars twice daily DX: E 11.9 05/20/16   Gildardo Cranker, DO  Probiotic Product (SOLUBLE FIBER/PROBIOTICS) CHEW Chew 1 tablet by mouth daily. 05/31/16   Barnet Glasgow, NP  PROLINE PO Take by mouth daily.    [provider]  traZODone (DESYREL) 50 MG tablet Take 25 mg by mouth at bedtime as needed.     [provider]    Family History Family History  Problem Relation Age of Onset  . Ovarian cancer Mother   . Lymphoma Father   . Colon cancer Sister     Social History Social History  Substance Use Topics  . Smoking status: Never Smoker  . Smokeless tobacco: Never Used  . Alcohol use No      Allergies   Oxycodone and Statins   Review of Systems Review of Systems ROS reviewed and all are negative for acute change except as noted in the HPI.  Physical Exam Updated Vital Signs BP 140/85   Pulse (!) 109   Temp 98.5 F (36.9 C) (Oral)   Resp 19   Ht '5\' 3"'  (1.6 m)   Wt 83.9 kg (185 lb)   SpO2 96%   BMI 32.77 kg/m   Physical Exam  Constitutional: She is oriented to person, place, and time. Vital signs are normal. She appears well-developed and well-nourished. No distress.  HENT:  Head: Normocephalic and atraumatic. Head is without raccoon's eyes and without Battle's sign.  Right Ear: Hearing normal. No hemotympanum.  Left Ear: Hearing normal. No hemotympanum.  Nose: Nose normal.  Mouth/Throat: Uvula is midline, oropharynx is clear and moist and mucous membranes are normal.  2 inc laceration below hair line on forehead. Linear. Bleeding controlled. Bottom of wound visualized.   Eyes: Conjunctivae and EOM are normal. Pupils are equal, round, and reactive to light.  Neck: Trachea normal and normal range of motion. Neck supple. No spinous process tenderness and no muscular tenderness present. No tracheal deviation and normal range of motion present.  TTP along cervical spine. No palpable or visible deformities. Cervical collar in place.   Cardiovascular: Normal rate, regular rhythm, S1 normal, S2 normal, normal heart sounds, intact distal pulses and normal pulses.   Pulmonary/Chest: Effort normal and breath sounds normal. No respiratory distress. She has no decreased breath sounds. She has no wheezes. She has no rhonchi. She has no rales.  Abdominal: Soft. Normal appearance and bowel sounds are normal. There is no tenderness. There is no rigidity and no guarding.  Musculoskeletal: Normal range of motion.  Right Shoulder: Negative hawkins test, negative Neer's test, no TTP over shoulder or elbow. No pain with flexion/extension/abduction/adduction internal or external  rotation. No obvious bony deformity.  Neurological: She is alert and oriented to person, place, and time. She has normal strength. No cranial nerve deficit or sensory deficit.  Cranial Nerves:  II: Pupils equal, round, reactive to light III,IV, VI: ptosis not present, extra-ocular motions intact bilaterally  V,VII: smile symmetric, facial light touch sensation equal VIII: hearing grossly normal bilaterally  IX,X: midline uvula rise  XI: bilateral shoulder shrug equal and strong XII: midline tongue extension  Skin: Skin is warm and dry.  Psychiatric: She has a normal mood and affect. Her speech is normal and behavior is normal. Thought content normal.  Nursing note and vitals reviewed.    ED Treatments / Results  Labs (all labs ordered are listed, but only  abnormal results are displayed) Labs Reviewed  BASIC METABOLIC PANEL - Abnormal; Notable for the following:       Result Value   Glucose, Bld 190 (*)    All other components within normal limits  CBC    EKG  EKG Interpretation None       Radiology Dg Shoulder Right  Result Date: 09/03/2016 CLINICAL DATA:  Fall.  Right shoulder pain. EXAM: RIGHT SHOULDER - 2+ VIEW COMPARISON:  None. FINDINGS: Moderate degenerative changes in the right AC and glenohumeral joints with joint space narrowing and spurring. No acute bony abnormality. Specifically, no fracture, subluxation, or dislocation. Soft tissues are intact. IMPRESSION: No acute bony abnormality. Electronically Signed   By: Rolm Baptise M.D.   On: 09/03/2016 11:47   Ct Head Wo Contrast  Result Date: 09/03/2016 CLINICAL DATA:  Pain following fall with right-sided facial discomfort EXAM: CT HEAD WITHOUT CONTRAST CT MAXILLOFACIAL WITHOUT CONTRAST CT CERVICAL SPINE WITHOUT CONTRAST TECHNIQUE: Multidetector CT imaging of the head, cervical spine, and maxillofacial structures were performed using the standard protocol without intravenous contrast. Multiplanar CT image reconstructions  of the cervical spine and maxillofacial structures were also generated. COMPARISON:  None. FINDINGS: CT HEAD FINDINGS Brain: The ventricles are normal in size and configuration. There is no intracranial mass, hemorrhage, extra-axial fluid collection, or midline shift. There is minimal small vessel disease in the centra semiovale bilaterally. Elsewhere gray-white compartments appear normal. No acute infarct evident. Vascular: There is no appreciable hyperdense vessel. There is no appreciable vascular calcification. Skull: Bony calvarium appears intact. There is a high right frontal scalp hematoma. Other: Mastoid air cells are clear. CT MAXILLOFACIAL FINDINGS Osseous: There is no evident fracture or dislocation. No blastic or lytic bone lesions are evident. Orbits: Orbits appear symmetric bilaterally. No intraorbital lesion on either side. Sinuses: Frontal sinuses are virtually aplastic. There is slight mucosal thickening in several ethmoid air cells. Other paranasal sinuses are clear. Ostiomeatal unit complexes are patent bilaterally. No air-fluid level. No bony destruction or expansion. There is mild rightward deviation of the upper nasal septum. There is no nares obstruction. Soft tissues: There is no soft tissue hematoma or abscess. There is slight soft tissue swelling involving the mid right face. Tongue and tongue base regions appear normal. Visualized pharynx appears normal. Salivary glands appear symmetric bilaterally. No evident adenopathy. CT CERVICAL SPINE FINDINGS Alignment: There is no appreciable spondylolisthesis. Skull base and vertebrae: Skull base and craniocervical junction regions appear normal. There is a prominent cyst in the base the odontoid. There is no fracture. No blastic or lytic bone lesions are evident. Soft tissues and spinal canal: Prevertebral soft tissues and predental space regions are normal. There are no paraspinous lesions. There is no cord or canal hematoma evident. Disc levels:  There is marked disc space narrowing at C4-5 and C5-6. There is marked disc space narrowing and visualized upper thoracic regions. There is moderate disc space narrowing at C2-3. There are prominent anterior osteophytes at C3, C4, C5, C6, and C7. There is multilevel facet hypertrophy. Exit foraminal narrowing is most marked on the right at C3-4, on the right at C4-5, on the left at C5-6, and on the left at C6-7. No disc extrusion or stenosis evident. Upper chest: Visualized upper lung zone regions are clear. Other: There is atherosclerotic calcification in the right carotid artery. IMPRESSION: CT head: Minimal periventricular small vessel disease. No intracranial mass, hemorrhage, or extra-axial fluid collection. No acute infarct. High right frontal scalp hematoma without underlying fracture. CT  maxillofacial: Soft tissue swelling mid right face. No frank hematoma. No fracture or dislocation. Mild ethmoid air cell disease bilaterally. Ostiomeatal unit complexes are patent bilaterally. Mildly deviated upper nasal septum toward the right. No intraorbital lesions. CT cervical spine: No fracture or spondylolisthesis. Multilevel osteoarthritic change. Calcification right carotid artery. Electronically Signed   By: Lowella Grip III M.D.   On: 09/03/2016 11:40   Ct Cervical Spine Wo Contrast  Result Date: 09/03/2016 CLINICAL DATA:  Pain following fall with right-sided facial discomfort EXAM: CT HEAD WITHOUT CONTRAST CT MAXILLOFACIAL WITHOUT CONTRAST CT CERVICAL SPINE WITHOUT CONTRAST TECHNIQUE: Multidetector CT imaging of the head, cervical spine, and maxillofacial structures were performed using the standard protocol without intravenous contrast. Multiplanar CT image reconstructions of the cervical spine and maxillofacial structures were also generated. COMPARISON:  None. FINDINGS: CT HEAD FINDINGS Brain: The ventricles are normal in size and configuration. There is no intracranial mass, hemorrhage, extra-axial  fluid collection, or midline shift. There is minimal small vessel disease in the centra semiovale bilaterally. Elsewhere gray-white compartments appear normal. No acute infarct evident. Vascular: There is no appreciable hyperdense vessel. There is no appreciable vascular calcification. Skull: Bony calvarium appears intact. There is a high right frontal scalp hematoma. Other: Mastoid air cells are clear. CT MAXILLOFACIAL FINDINGS Osseous: There is no evident fracture or dislocation. No blastic or lytic bone lesions are evident. Orbits: Orbits appear symmetric bilaterally. No intraorbital lesion on either side. Sinuses: Frontal sinuses are virtually aplastic. There is slight mucosal thickening in several ethmoid air cells. Other paranasal sinuses are clear. Ostiomeatal unit complexes are patent bilaterally. No air-fluid level. No bony destruction or expansion. There is mild rightward deviation of the upper nasal septum. There is no nares obstruction. Soft tissues: There is no soft tissue hematoma or abscess. There is slight soft tissue swelling involving the mid right face. Tongue and tongue base regions appear normal. Visualized pharynx appears normal. Salivary glands appear symmetric bilaterally. No evident adenopathy. CT CERVICAL SPINE FINDINGS Alignment: There is no appreciable spondylolisthesis. Skull base and vertebrae: Skull base and craniocervical junction regions appear normal. There is a prominent cyst in the base the odontoid. There is no fracture. No blastic or lytic bone lesions are evident. Soft tissues and spinal canal: Prevertebral soft tissues and predental space regions are normal. There are no paraspinous lesions. There is no cord or canal hematoma evident. Disc levels: There is marked disc space narrowing at C4-5 and C5-6. There is marked disc space narrowing and visualized upper thoracic regions. There is moderate disc space narrowing at C2-3. There are prominent anterior osteophytes at C3, C4,  C5, C6, and C7. There is multilevel facet hypertrophy. Exit foraminal narrowing is most marked on the right at C3-4, on the right at C4-5, on the left at C5-6, and on the left at C6-7. No disc extrusion or stenosis evident. Upper chest: Visualized upper lung zone regions are clear. Other: There is atherosclerotic calcification in the right carotid artery. IMPRESSION: CT head: Minimal periventricular small vessel disease. No intracranial mass, hemorrhage, or extra-axial fluid collection. No acute infarct. High right frontal scalp hematoma without underlying fracture. CT maxillofacial: Soft tissue swelling mid right face. No frank hematoma. No fracture or dislocation. Mild ethmoid air cell disease bilaterally. Ostiomeatal unit complexes are patent bilaterally. Mildly deviated upper nasal septum toward the right. No intraorbital lesions. CT cervical spine: No fracture or spondylolisthesis. Multilevel osteoarthritic change. Calcification right carotid artery. Electronically Signed   By: Lowella Grip III M.D.  On: 09/03/2016 11:40   Dg Knee Complete 4 Views Right  Result Date: 09/03/2016 CLINICAL DATA:  Fall.  Right knee pain EXAM: RIGHT KNEE - COMPLETE 4+ VIEW COMPARISON:  None. FINDINGS: Moderate tricompartment degenerative changes, most pronounced in the patellofemoral compartment. No acute bony abnormality. Specifically, no fracture, subluxation, or dislocation. Soft tissues are intact. No joint effusion. IMPRESSION: Moderate degenerative changes.  No acute bony abnormality. Electronically Signed   By: Rolm Baptise M.D.   On: 09/03/2016 11:47   Dg Hip Unilat W Or Wo Pelvis 2-3 Views Right  Result Date: 09/03/2016 CLINICAL DATA:  Mechanical fall while walking up steps today. RIGHT hip pain. EXAM: DG HIP (WITH OR WITHOUT PELVIS) 2-3V RIGHT COMPARISON:  None. FINDINGS: There is no evidence of hip fracture or dislocation. There is no evidence of arthropathy or other focal bone abnormality. Degenerative  changes seen around the greater trochanter. No pelvic fracture is seen. Unremarkable RIGHT THA. Lumbar spondylosis. IMPRESSION: No acute RIGHT hip fracture. Electronically Signed   By: Staci Righter M.D.   On: 09/03/2016 11:48   Ct Maxillofacial Wo Contrast  Result Date: 09/03/2016 CLINICAL DATA:  Pain following fall with right-sided facial discomfort EXAM: CT HEAD WITHOUT CONTRAST CT MAXILLOFACIAL WITHOUT CONTRAST CT CERVICAL SPINE WITHOUT CONTRAST TECHNIQUE: Multidetector CT imaging of the head, cervical spine, and maxillofacial structures were performed using the standard protocol without intravenous contrast. Multiplanar CT image reconstructions of the cervical spine and maxillofacial structures were also generated. COMPARISON:  None. FINDINGS: CT HEAD FINDINGS Brain: The ventricles are normal in size and configuration. There is no intracranial mass, hemorrhage, extra-axial fluid collection, or midline shift. There is minimal small vessel disease in the centra semiovale bilaterally. Elsewhere gray-white compartments appear normal. No acute infarct evident. Vascular: There is no appreciable hyperdense vessel. There is no appreciable vascular calcification. Skull: Bony calvarium appears intact. There is a high right frontal scalp hematoma. Other: Mastoid air cells are clear. CT MAXILLOFACIAL FINDINGS Osseous: There is no evident fracture or dislocation. No blastic or lytic bone lesions are evident. Orbits: Orbits appear symmetric bilaterally. No intraorbital lesion on either side. Sinuses: Frontal sinuses are virtually aplastic. There is slight mucosal thickening in several ethmoid air cells. Other paranasal sinuses are clear. Ostiomeatal unit complexes are patent bilaterally. No air-fluid level. No bony destruction or expansion. There is mild rightward deviation of the upper nasal septum. There is no nares obstruction. Soft tissues: There is no soft tissue hematoma or abscess. There is slight soft tissue  swelling involving the mid right face. Tongue and tongue base regions appear normal. Visualized pharynx appears normal. Salivary glands appear symmetric bilaterally. No evident adenopathy. CT CERVICAL SPINE FINDINGS Alignment: There is no appreciable spondylolisthesis. Skull base and vertebrae: Skull base and craniocervical junction regions appear normal. There is a prominent cyst in the base the odontoid. There is no fracture. No blastic or lytic bone lesions are evident. Soft tissues and spinal canal: Prevertebral soft tissues and predental space regions are normal. There are no paraspinous lesions. There is no cord or canal hematoma evident. Disc levels: There is marked disc space narrowing at C4-5 and C5-6. There is marked disc space narrowing and visualized upper thoracic regions. There is moderate disc space narrowing at C2-3. There are prominent anterior osteophytes at C3, C4, C5, C6, and C7. There is multilevel facet hypertrophy. Exit foraminal narrowing is most marked on the right at C3-4, on the right at C4-5, on the left at C5-6, and on the left at C6-7.  No disc extrusion or stenosis evident. Upper chest: Visualized upper lung zone regions are clear. Other: There is atherosclerotic calcification in the right carotid artery. IMPRESSION: CT head: Minimal periventricular small vessel disease. No intracranial mass, hemorrhage, or extra-axial fluid collection. No acute infarct. High right frontal scalp hematoma without underlying fracture. CT maxillofacial: Soft tissue swelling mid right face. No frank hematoma. No fracture or dislocation. Mild ethmoid air cell disease bilaterally. Ostiomeatal unit complexes are patent bilaterally. Mildly deviated upper nasal septum toward the right. No intraorbital lesions. CT cervical spine: No fracture or spondylolisthesis. Multilevel osteoarthritic change. Calcification right carotid artery. Electronically Signed   By: Lowella Grip III M.D.   On: 09/03/2016 11:40     Procedures .Marland KitchenLaceration Repair Date/Time: 09/03/2016 1:12 PM Performed by: Shary Decamp Authorized by: Shary Decamp   Consent:    Consent obtained:  Verbal   Consent given by:  Patient   Risks discussed:  Infection, pain, poor cosmetic result and need for additional repair Anesthesia (see MAR for exact dosages):    Anesthesia method:  Local infiltration   Local anesthetic:  Lidocaine 1% WITH epi Laceration details:    Location:  Scalp   Scalp location:  Frontal   Length (cm):  2.5 Repair type:    Repair type:  Simple Pre-procedure details:    Preparation:  Patient was prepped and draped in usual sterile fashion and imaging obtained to evaluate for foreign bodies Exploration:    Wound exploration: entire depth of wound probed and visualized   Treatment:    Area cleansed with:  Hibiclens and Betadine   Amount of cleaning:  Extensive   Irrigation solution:  Sterile water   Irrigation method:  Pressure wash   Visualized foreign bodies/material removed: no   Skin repair:    Repair method:  Sutures   Suture size:  5-0   Suture material:  Prolene   Suture technique:  Simple interrupted   Number of sutures:  4 Approximation:    Approximation:  Close   Vermilion border: well-aligned   Post-procedure details:    Dressing:  Antibiotic ointment and non-adherent dressing   Patient tolerance of procedure:  Tolerated well, no immediate complications   (including critical care time)  Medications Ordered in ED Medications - No data to display   Initial Impression / Assessment and Plan / ED Course  I have reviewed the triage vital signs and the nursing notes.  Pertinent labs & imaging results that were available during my care of the patient were reviewed by me and considered in my medical decision making (see chart for details).  Final Clinical Impressions(s) / ED Diagnoses  {I have reviewed and evaluated the relevant laboratory values. {I have reviewed and evaluated the  relevant imaging studies.  {I have reviewed the relevant previous healthcare records.  {I obtained HPI from historian.   ED Course:  Assessment: Patient is a 66 y.o. female that presents with laceration to forehead s/p mechanical fall. Laceration to forehead. CT Head/Max/CSpine unremarkable. DG Shoulder unremarkable. Pt requested DG Hip and Right knee due to fall before and having residual discomfort, which was unremarkable. Able to ambulate effectively. Tdap booster UTD. Pressure irrigation performed. Bottom of the wound visualized with bleeding controled. Laceration occurred < 8 hours prior to repair which was well tolerated. Pt has no co morbidities to effect normal wound healing. Discussed suture home care w pt and answered questions. Pt to f-u for wound check and suture removal in 5-8 days. FOUR PROLENE PLACED. Pt  is hemodynamically stable w no complaints prior to dc.    Disposition/Plan:  DC Home Additional Verbal discharge instructions given and discussed with patient.  Pt Instructed to f/u with PCP in the next week for evaluation and treatment of symptoms. Return precautions given Pt acknowledges and agrees with plan  Supervising Physician Charlesetta Shanks, MD  Final diagnoses:  Laceration of scalp, initial encounter  Musculoskeletal pain    New Prescriptions New Prescriptions   No medications on file     Shary Decamp, Hershal Coria 09/03/16 [place], [place], MD 09/05/16 [phone]

## 2016-09-05 ENCOUNTER — Telehealth: Payer: Self-pay

## 2016-09-05 NOTE — Telephone Encounter (Signed)
She needs to be seen by a provider in order to properly examine her to determine which medication is best for her.

## 2016-09-05 NOTE — Telephone Encounter (Addendum)
Patient called and left detailed message that she had fallen on Wednesday and hit her forehead on the corner of concrete steps. Since this she was sent to the ER and with pain in her neck, hip and head. She said they prescribed her with Methocarbamol and Ibuprofen 600. She said she has been having migraines since she went to hospital. She stated the ER give her a shot of Toradol. Since then she has had severe neck pain, migraines, and  hip pain. She wanted to know if the provider can prescribe her medicine until her appointment on Wednesday.   Please advise.

## 2016-09-08 ENCOUNTER — Encounter: Payer: Self-pay | Admitting: Nurse Practitioner

## 2016-09-08 ENCOUNTER — Ambulatory Visit (INDEPENDENT_AMBULATORY_CARE_PROVIDER_SITE_OTHER): Payer: Medicare Other | Admitting: Nurse Practitioner

## 2016-09-08 VITALS — BP 158/88 | HR 68 | Temp 98.0°F | Resp 17 | Ht 63.0 in | Wt 186.6 lb

## 2016-09-08 DIAGNOSIS — W1800XA Striking against unspecified object with subsequent fall, initial encounter: Secondary | ICD-10-CM | POA: Diagnosis not present

## 2016-09-08 DIAGNOSIS — R269 Unspecified abnormalities of gait and mobility: Secondary | ICD-10-CM

## 2016-09-08 DIAGNOSIS — M542 Cervicalgia: Secondary | ICD-10-CM | POA: Diagnosis not present

## 2016-09-08 MED ORDER — IBUPROFEN 600 MG PO TABS: 600.0000 mg | ORAL_TABLET | Freq: Four times a day (QID) | ORAL | 0 refills | Status: DC | PRN

## 2016-09-08 NOTE — Telephone Encounter (Signed)
Patient called back and scheduled an appointment for today with Jessica.  
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
## 2016-09-08 NOTE — Telephone Encounter (Signed)
Left message on voicemail for patient to return call when available   

## 2016-09-08 NOTE — Patient Instructions (Signed)
May take tylenol 1000 mg by mouth every 8 hours as needed for pain  Neurology referral and physical therapy referral placed

## 2016-09-10 ENCOUNTER — Encounter: Payer: Self-pay | Admitting: Internal Medicine

## 2016-09-10 ENCOUNTER — Encounter: Payer: Self-pay | Admitting: Neurology

## 2016-09-10 ENCOUNTER — Ambulatory Visit: Payer: Self-pay | Admitting: Internal Medicine

## 2016-09-23 ENCOUNTER — Ambulatory Visit: Payer: Medicare Other | Admitting: Physical Therapy

## 2016-09-23 ENCOUNTER — Ambulatory Visit (INDEPENDENT_AMBULATORY_CARE_PROVIDER_SITE_OTHER): Payer: Medicare Other | Admitting: Internal Medicine

## 2016-09-23 ENCOUNTER — Encounter: Payer: Self-pay | Admitting: Internal Medicine

## 2016-09-23 VITALS — BP 126/96 | HR 85 | Temp 98.5°F | Ht 63.0 in | Wt 188.0 lb

## 2016-09-23 DIAGNOSIS — R296 Repeated falls: Secondary | ICD-10-CM | POA: Diagnosis not present

## 2016-09-23 DIAGNOSIS — R269 Unspecified abnormalities of gait and mobility: Secondary | ICD-10-CM

## 2016-09-23 DIAGNOSIS — E782 Mixed hyperlipidemia: Secondary | ICD-10-CM | POA: Diagnosis not present

## 2016-09-23 DIAGNOSIS — Z79899 Other long term (current) drug therapy: Secondary | ICD-10-CM | POA: Diagnosis not present

## 2016-09-23 DIAGNOSIS — G251 Drug-induced tremor: Secondary | ICD-10-CM

## 2016-09-23 DIAGNOSIS — E119 Type 2 diabetes mellitus without complications: Secondary | ICD-10-CM | POA: Diagnosis not present

## 2016-09-23 DIAGNOSIS — J302 Other seasonal allergic rhinitis: Secondary | ICD-10-CM

## 2016-09-23 MED ORDER — LORATADINE 10 MG PO TABS: 10.0000 mg | ORAL_TABLET | Freq: Every day | ORAL | 11 refills | Status: DC

## 2016-09-23 MED ORDER — METHYLPREDNISOLONE ACETATE 40 MG/ML IJ SUSP
40.0000 mg | Freq: Once | INTRAMUSCULAR | Status: AC
  Administered 2016-09-23: 40 mg via INTRAMUSCULAR

## 2016-09-23 NOTE — Patient Instructions (Addendum)
Take claritin 10mg  daily for seasonal allergy  May use saline nasal spray as needed to keep nose moist  Depo-medrol 40mg  injection today  May gargle with warm salt water as needed for sore throat  Increase water intake to reduce elevated blood sugar after steroid injection  Continue current medications as ordered  Follow up with specialists as scheduled  Fasting labs next available  Follow up in 3-4 mos for DM, hyperlipidemia, falls

## 2016-09-23 NOTE — Progress Notes (Signed)
Patient ID: Kaitlyn Wood, female   DOB: [birth year], 66 y.o.   MRN: [id number]    Location:  [facility] Place of Service: OFFICE  Chief Complaint  Patient presents with  . Medical Management of Chronic Issues    6 month follow-up on diabetes and HLD  . Medication Management    should she continue Methocarbamol? Took the last one today.   Marland Kitchen Referral    last eye exam a year ago, [place], [place]. No diabetic retinopathy at that time. Needs ophthalmologlist in [place].   Marland Kitchen Referral    never had colonoscopy    HPI:  66 yo female seen today for f/u. She has fallen x 3 n last several weeks. Last fall in 08/2016 caused scalp laceration when she hit her head on step. She went to the ED and had 4 sutures placed. Neurology referral made by NP due to falls, altered gait and neck pain. She has not seen PT yet. She also reports tremors that was noticed by school children. She has right neck soreness.   She relocated from [place], [place] in June 2017.  Urinary incontinence - urgency. She was told by GYN that she has prolapsed uterus. Improved overall.  Bipolar d/o/anxiety/insomnia/PTSD - she is followed by psych. Current provider is Dr Casimiro Needle. She is taking wellbutrin sr, klonopin, neurontin, lamictal, latuda and trazodone. She is off vyvanse due to cost. She had pt assistance in the past and is awaiting approval. Her therapist is [facility]. She states she is in a hypomanic state today  Hyperlipidemia - she reports intolerance to statins as they caused "my urine to turn brown". She is hesitant to try any other medications. She states LDL was 180 in the past.  Current LDL 151; Tchol 239; HDL 60  DM - she takes metformin daily. BS are normal range and no low BS reactions. Last A1c 6%. She has not had any numbness/tingling. She saw eye Dr in May and was told no diabetic changes. Urine microalbumin/creatinine ratio 5. Cr 1.04  Cervical/lumbar herniated/bulging discs - C2-T4; she reports no significant pain  at this time. She has never had sx. Initial injury was falling off porch with whiplash injury many years ago. She had an episode when her leg gave away when she went to get out of her car.  FHx cancer - Mother with ovarian cancer; sister/MGM with colorectal cancer; Father with Lymphoma  Hep C nonreactive  Past Medical History:  Diagnosis Date  . Anxiety   . Bipolar 1 disorder ([place])   . Bulging of cervical intervertebral disc    C2  . Bulging of thoracic intervertebral disc    t4  . Depression   . Diabetes mellitus without complication ([place])   . Hyperlipidemia   . PTSD (post-traumatic stress disorder)     Past Surgical History:  Procedure Laterality Date  . HEMICOLECTOMY  1997  . JOINT REPLACEMENT  2013   hip    Patient Care Team: Gildardo Cranker, DO as PCP - General (Internal Medicine) Brien Few, MD as Consulting Physician (Obstetrics and Gynecology)  Social History   Social History  . Marital status: Divorced    Spouse name: N/A  . Number of children: N/A  . Years of education: N/A   Occupational History  . Not on file.   Social History Main Topics  . Smoking status: Never Smoker  . Smokeless tobacco: Never Used  . Alcohol use No  . Drug use: No  . Sexual activity: Not on file  Other Topics Concern  . Not on file   Social History Narrative   Diet? ? Fresh food- very little prepared food- no fast food.       Do you drink/eat things with caffeine?  Yes      Marital status?         Divorced                           What year were you married? 1978      Do you live in a house, apartment, assisted living, condo, trailer, etc.? apartment      Is it one or more stories? Yes      How many persons live in your home? 1      Do you have any pets in your home? (please list)  1 cat, Polly      Current or past profession: Air traffic controller, sales       Do you exercise?       yes                               Type & how often? 2-3 times a week       Do you have a living will? No      Do you have a DNR form?    no                              If not, do you want to discuss one? yes      Do you have signed POA/HPOA for forms? no     reports that she has never smoked. She has never used smokeless tobacco. She reports that she does not drink alcohol or use drugs.  Family History  Problem Relation Age of Onset  . Ovarian cancer Mother   . Lymphoma Father   . Colon cancer Sister    Family Status  Relation Status  . Mother Deceased  . Father Deceased  . Sister Deceased at age 4  . Sister Alive  . Son Alive  . Daughter Alive  . Son Alive  . Son Deceased at age 43       Heroin/clonazipan overdose     Allergies  Allergen Reactions  . Oxycodone     Migraines  . Statins     "makes my urine turn brown"    Medications: Patient's Medications  New Prescriptions   No medications on file  Previous Medications   ASCORBIC ACID (VITAMIN C) 1000 MG TABLET    Take 1,000 mg by mouth daily.   BLOOD GLUCOSE MONITORING SUPPL (ONE TOUCH ULTRA SYSTEM KIT) W/DEVICE KIT    Use to test blood sugar once daily. Dx: E11.9   BUPROPION (WELLBUTRIN SR) 150 MG 12 HR TABLET    Take 150 mg by mouth 2 (two) times daily.   CLONAZEPAM (KLONOPIN) 1 MG TABLET    Take 0.5-1 mg by mouth 2 (two) times daily as needed.    GABAPENTIN (NEURONTIN) 300 MG CAPSULE    Take 1 capsule (300 mg total) by mouth 2 (two) times daily.   GLUCOSE BLOOD TEST STRIP    One Touch Ultra test Strips Use to check blood up to three times daily. Dx E11.9   IBUPROFEN (ADVIL,MOTRIN) 600 MG TABLET    Take 1 tablet (600 mg total) by mouth every  6 (six) hours as needed.   LAMOTRIGINE (LAMICTAL) 200 MG TABLET    Take 200 mg by mouth 2 (two) times daily.    LISDEXAMFETAMINE (VYVANSE) 40 MG CAPSULE    Take 40 mg by mouth every morning.   METFORMIN (GLUCOPHAGE) 500 MG TABLET    Take 1 tablet (500 mg total) by mouth 2 (two) times daily with a meal.   METHOCARBAMOL (ROBAXIN) 500 MG TABLET     Take 1 tablet (500 mg total) by mouth 2 (two) times daily.   ONE TOUCH LANCETS MISC    Use to test blood sugars twice daily DX: E 11.9   PROBIOTIC PRODUCT (SOLUBLE FIBER/PROBIOTICS) CHEW    Chew 1 tablet by mouth daily.   PROLINE PO    Take by mouth daily.   TRAZODONE (DESYREL) 50 MG TABLET    Take 25 mg by mouth at bedtime as needed.   Modified Medications   No medications on file  Discontinued Medications   No medications on file    Review of Systems  Unable to perform ROS: Psychiatric disorder    Vitals:   09/23/16 1533  BP: (!) 126/96  Pulse: 85  Temp: 98.5 F (36.9 C)  TempSrc: Oral  SpO2: 98%  Weight: 188 lb (85.3 kg)  Height: _0  (1.6 m)   Body mass index is 33.3 kg/m.  Physical Exam  Constitutional: She appears well-developed and well-nourished.  HENT:  Mouth/Throat: Oropharynx is clear and moist. No oropharyngeal exudate.  TMs dull b/l no redness or d/c. Oropharynx cobblestoning and red but no exudate; MMM; no oral thrush; maxillary and frontal sinus TTP  Eyes: Pupils are equal, round, and reactive to light. No scleral icterus.  Neck: Neck supple. Carotid bruit is not present. No tracheal deviation present.  Cardiovascular: Normal rate, regular rhythm, normal heart sounds and intact distal pulses.  Exam reveals no gallop and no friction rub.   No murmur heard. No LE edema b/l. no calf TTP.   Pulmonary/Chest: Effort normal. No stridor. No respiratory distress. She has wheezes (end expiratory). She has no rales.  Abdominal: Soft. Bowel sounds are normal. She exhibits no distension and no mass. There is no hepatomegaly. There is no tenderness. There is no rebound and no guarding.  Musculoskeletal: She exhibits edema.  Lymphadenopathy:    She has no cervical adenopathy.  Neurological: She is alert. She displays tremor (resting). Gait abnormal.  Skin: Skin is warm and dry. No rash noted.  left proximal leg yellowish contusion  Psychiatric: Her behavior is normal.  Thought content normal. Her mood appears anxious. Her speech is tangential.     Labs reviewed: Admission on 09/03/2016, Discharged on 09/03/2016  Component Date Value Ref Range Status  . WBC 09/03/2016 5.6  4.0 - 10.5 K/uL Final  . RBC 09/03/2016 4.21  3.87 - 5.11 MIL/uL Final  . Hemoglobin 09/03/2016 12.2  12.0 - 15.0 g/dL Final  . HCT 09/03/2016 37.4  36.0 - 46.0 % Final  . MCV 09/03/2016 88.8  78.0 - 100.0 fL Final  . MCH 09/03/2016 29.0  26.0 - 34.0 pg Final  . MCHC 09/03/2016 32.6  30.0 - 36.0 g/dL Final  . RDW 09/03/2016 13.5  11.5 - 15.5 % Final  . Platelets 09/03/2016 199  150 - 400 K/uL Final  . Sodium 09/03/2016 138  135 - 145 mmol/L Final  . Potassium 09/03/2016 4.3  3.5 - 5.1 mmol/L Final  . Chloride 09/03/2016 105  101 - 111 mmol/L Final  .  CO2 09/03/2016 25  22 - 32 mmol/L Final  . Glucose, Bld 09/03/2016 190* 65 - 99 mg/dL Final  . BUN 09/03/2016 14  6 - 20 mg/dL Final  . Creatinine, Ser 09/03/2016 0.88  0.44 - 1.00 mg/dL Final  . Calcium 09/03/2016 8.9  8.9 - 10.3 mg/dL Final  . GFR calc non Af Amer 09/03/2016 >60  >60 mL/min Final  . GFR calc Af Amer 09/03/2016 >60  >60 mL/min Final   Comment: (NOTE) The eGFR has been calculated using the CKD EPI equation. This calculation has not been validated in all clinical situations. eGFR's persistently <60 mL/min signify possible Chronic Kidney Disease.   . Anion gap 09/03/2016 8  5 - 15 Final  . Glucose-Capillary 09/03/2016 120* 65 - 99 mg/dL Final    Dg Shoulder Right  Result Date: 09/03/2016 CLINICAL DATA:  Fall.  Right shoulder pain. EXAM: RIGHT SHOULDER - 2+ VIEW COMPARISON:  None. FINDINGS: Moderate degenerative changes in the right AC and glenohumeral joints with joint space narrowing and spurring. No acute bony abnormality. Specifically, no fracture, subluxation, or dislocation. Soft tissues are intact. IMPRESSION: No acute bony abnormality. Electronically Signed   By: Rolm Baptise M.D.   On: 09/03/2016 11:47    Ct Head Wo Contrast  Result Date: 09/03/2016 CLINICAL DATA:  Pain following fall with right-sided facial discomfort EXAM: CT HEAD WITHOUT CONTRAST CT MAXILLOFACIAL WITHOUT CONTRAST CT CERVICAL SPINE WITHOUT CONTRAST TECHNIQUE: Multidetector CT imaging of the head, cervical spine, and maxillofacial structures were performed using the standard protocol without intravenous contrast. Multiplanar CT image reconstructions of the cervical spine and maxillofacial structures were also generated. COMPARISON:  None. FINDINGS: CT HEAD FINDINGS Brain: The ventricles are normal in size and configuration. There is no intracranial mass, hemorrhage, extra-axial fluid collection, or midline shift. There is minimal small vessel disease in the centra semiovale bilaterally. Elsewhere gray-white compartments appear normal. No acute infarct evident. Vascular: There is no appreciable hyperdense vessel. There is no appreciable vascular calcification. Skull: Bony calvarium appears intact. There is a high right frontal scalp hematoma. Other: Mastoid air cells are clear. CT MAXILLOFACIAL FINDINGS Osseous: There is no evident fracture or dislocation. No blastic or lytic bone lesions are evident. Orbits: Orbits appear symmetric bilaterally. No intraorbital lesion on either side. Sinuses: Frontal sinuses are virtually aplastic. There is slight mucosal thickening in several ethmoid air cells. Other paranasal sinuses are clear. Ostiomeatal unit complexes are patent bilaterally. No air-fluid level. No bony destruction or expansion. There is mild rightward deviation of the upper nasal septum. There is no nares obstruction. Soft tissues: There is no soft tissue hematoma or abscess. There is slight soft tissue swelling involving the mid right face. Tongue and tongue base regions appear normal. Visualized pharynx appears normal. Salivary glands appear symmetric bilaterally. No evident adenopathy. CT CERVICAL SPINE FINDINGS Alignment: There is no  appreciable spondylolisthesis. Skull base and vertebrae: Skull base and craniocervical junction regions appear normal. There is a prominent cyst in the base the odontoid. There is no fracture. No blastic or lytic bone lesions are evident. Soft tissues and spinal canal: Prevertebral soft tissues and predental space regions are normal. There are no paraspinous lesions. There is no cord or canal hematoma evident. Disc levels: There is marked disc space narrowing at C4-5 and C5-6. There is marked disc space narrowing and visualized upper thoracic regions. There is moderate disc space narrowing at C2-3. There are prominent anterior osteophytes at C3, C4, C5, C6, and C7. There is multilevel facet  hypertrophy. Exit foraminal narrowing is most marked on the right at C3-4, on the right at C4-5, on the left at C5-6, and on the left at C6-7. No disc extrusion or stenosis evident. Upper chest: Visualized upper lung zone regions are clear. Other: There is atherosclerotic calcification in the right carotid artery. IMPRESSION: CT head: Minimal periventricular small vessel disease. No intracranial mass, hemorrhage, or extra-axial fluid collection. No acute infarct. High right frontal scalp hematoma without underlying fracture. CT maxillofacial: Soft tissue swelling mid right face. No frank hematoma. No fracture or dislocation. Mild ethmoid air cell disease bilaterally. Ostiomeatal unit complexes are patent bilaterally. Mildly deviated upper nasal septum toward the right. No intraorbital lesions. CT cervical spine: No fracture or spondylolisthesis. Multilevel osteoarthritic change. Calcification right carotid artery. Electronically Signed   By: Lowella Grip III M.D.   On: 09/03/2016 11:40   Ct Cervical Spine Wo Contrast  Result Date: 09/03/2016 CLINICAL DATA:  Pain following fall with right-sided facial discomfort EXAM: CT HEAD WITHOUT CONTRAST CT MAXILLOFACIAL WITHOUT CONTRAST CT CERVICAL SPINE WITHOUT CONTRAST TECHNIQUE:  Multidetector CT imaging of the head, cervical spine, and maxillofacial structures were performed using the standard protocol without intravenous contrast. Multiplanar CT image reconstructions of the cervical spine and maxillofacial structures were also generated. COMPARISON:  None. FINDINGS: CT HEAD FINDINGS Brain: The ventricles are normal in size and configuration. There is no intracranial mass, hemorrhage, extra-axial fluid collection, or midline shift. There is minimal small vessel disease in the centra semiovale bilaterally. Elsewhere gray-white compartments appear normal. No acute infarct evident. Vascular: There is no appreciable hyperdense vessel. There is no appreciable vascular calcification. Skull: Bony calvarium appears intact. There is a high right frontal scalp hematoma. Other: Mastoid air cells are clear. CT MAXILLOFACIAL FINDINGS Osseous: There is no evident fracture or dislocation. No blastic or lytic bone lesions are evident. Orbits: Orbits appear symmetric bilaterally. No intraorbital lesion on either side. Sinuses: Frontal sinuses are virtually aplastic. There is slight mucosal thickening in several ethmoid air cells. Other paranasal sinuses are clear. Ostiomeatal unit complexes are patent bilaterally. No air-fluid level. No bony destruction or expansion. There is mild rightward deviation of the upper nasal septum. There is no nares obstruction. Soft tissues: There is no soft tissue hematoma or abscess. There is slight soft tissue swelling involving the mid right face. Tongue and tongue base regions appear normal. Visualized pharynx appears normal. Salivary glands appear symmetric bilaterally. No evident adenopathy. CT CERVICAL SPINE FINDINGS Alignment: There is no appreciable spondylolisthesis. Skull base and vertebrae: Skull base and craniocervical junction regions appear normal. There is a prominent cyst in the base the odontoid. There is no fracture. No blastic or lytic bone lesions are  evident. Soft tissues and spinal canal: Prevertebral soft tissues and predental space regions are normal. There are no paraspinous lesions. There is no cord or canal hematoma evident. Disc levels: There is marked disc space narrowing at C4-5 and C5-6. There is marked disc space narrowing and visualized upper thoracic regions. There is moderate disc space narrowing at C2-3. There are prominent anterior osteophytes at C3, C4, C5, C6, and C7. There is multilevel facet hypertrophy. Exit foraminal narrowing is most marked on the right at C3-4, on the right at C4-5, on the left at C5-6, and on the left at C6-7. No disc extrusion or stenosis evident. Upper chest: Visualized upper lung zone regions are clear. Other: There is atherosclerotic calcification in the right carotid artery. IMPRESSION: CT head: Minimal periventricular small vessel disease. No intracranial  mass, hemorrhage, or extra-axial fluid collection. No acute infarct. High right frontal scalp hematoma without underlying fracture. CT maxillofacial: Soft tissue swelling mid right face. No frank hematoma. No fracture or dislocation. Mild ethmoid air cell disease bilaterally. Ostiomeatal unit complexes are patent bilaterally. Mildly deviated upper nasal septum toward the right. No intraorbital lesions. CT cervical spine: No fracture or spondylolisthesis. Multilevel osteoarthritic change. Calcification right carotid artery. Electronically Signed   By: Lowella Grip III M.D.   On: 09/03/2016 11:40   Dg Knee Complete 4 Views Right  Result Date: 09/03/2016 CLINICAL DATA:  Fall.  Right knee pain EXAM: RIGHT KNEE - COMPLETE 4+ VIEW COMPARISON:  None. FINDINGS: Moderate tricompartment degenerative changes, most pronounced in the patellofemoral compartment. No acute bony abnormality. Specifically, no fracture, subluxation, or dislocation. Soft tissues are intact. No joint effusion. IMPRESSION: Moderate degenerative changes.  No acute bony abnormality.  Electronically Signed   By: Rolm Baptise M.D.   On: 09/03/2016 11:47   Dg Hip Unilat W Or Wo Pelvis 2-3 Views Right  Result Date: 09/03/2016 CLINICAL DATA:  Mechanical fall while walking up steps today. RIGHT hip pain. EXAM: DG HIP (WITH OR WITHOUT PELVIS) 2-3V RIGHT COMPARISON:  None. FINDINGS: There is no evidence of hip fracture or dislocation. There is no evidence of arthropathy or other focal bone abnormality. Degenerative changes seen around the greater trochanter. No pelvic fracture is seen. Unremarkable RIGHT THA. Lumbar spondylosis. IMPRESSION: No acute RIGHT hip fracture. Electronically Signed   By: Staci Righter M.D.   On: 09/03/2016 11:48   Ct Maxillofacial Wo Contrast  Result Date: 09/03/2016 CLINICAL DATA:  Pain following fall with right-sided facial discomfort EXAM: CT HEAD WITHOUT CONTRAST CT MAXILLOFACIAL WITHOUT CONTRAST CT CERVICAL SPINE WITHOUT CONTRAST TECHNIQUE: Multidetector CT imaging of the head, cervical spine, and maxillofacial structures were performed using the standard protocol without intravenous contrast. Multiplanar CT image reconstructions of the cervical spine and maxillofacial structures were also generated. COMPARISON:  None. FINDINGS: CT HEAD FINDINGS Brain: The ventricles are normal in size and configuration. There is no intracranial mass, hemorrhage, extra-axial fluid collection, or midline shift. There is minimal small vessel disease in the centra semiovale bilaterally. Elsewhere gray-white compartments appear normal. No acute infarct evident. Vascular: There is no appreciable hyperdense vessel. There is no appreciable vascular calcification. Skull: Bony calvarium appears intact. There is a high right frontal scalp hematoma. Other: Mastoid air cells are clear. CT MAXILLOFACIAL FINDINGS Osseous: There is no evident fracture or dislocation. No blastic or lytic bone lesions are evident. Orbits: Orbits appear symmetric bilaterally. No intraorbital lesion on either side.  Sinuses: Frontal sinuses are virtually aplastic. There is slight mucosal thickening in several ethmoid air cells. Other paranasal sinuses are clear. Ostiomeatal unit complexes are patent bilaterally. No air-fluid level. No bony destruction or expansion. There is mild rightward deviation of the upper nasal septum. There is no nares obstruction. Soft tissues: There is no soft tissue hematoma or abscess. There is slight soft tissue swelling involving the mid right face. Tongue and tongue base regions appear normal. Visualized pharynx appears normal. Salivary glands appear symmetric bilaterally. No evident adenopathy. CT CERVICAL SPINE FINDINGS Alignment: There is no appreciable spondylolisthesis. Skull base and vertebrae: Skull base and craniocervical junction regions appear normal. There is a prominent cyst in the base the odontoid. There is no fracture. No blastic or lytic bone lesions are evident. Soft tissues and spinal canal: Prevertebral soft tissues and predental space regions are normal. There are no paraspinous lesions. There is  no cord or canal hematoma evident. Disc levels: There is marked disc space narrowing at C4-5 and C5-6. There is marked disc space narrowing and visualized upper thoracic regions. There is moderate disc space narrowing at C2-3. There are prominent anterior osteophytes at C3, C4, C5, C6, and C7. There is multilevel facet hypertrophy. Exit foraminal narrowing is most marked on the right at C3-4, on the right at C4-5, on the left at C5-6, and on the left at C6-7. No disc extrusion or stenosis evident. Upper chest: Visualized upper lung zone regions are clear. Other: There is atherosclerotic calcification in the right carotid artery. IMPRESSION: CT head: Minimal periventricular small vessel disease. No intracranial mass, hemorrhage, or extra-axial fluid collection. No acute infarct. High right frontal scalp hematoma without underlying fracture. CT maxillofacial: Soft tissue swelling mid  right face. No frank hematoma. No fracture or dislocation. Mild ethmoid air cell disease bilaterally. Ostiomeatal unit complexes are patent bilaterally. Mildly deviated upper nasal septum toward the right. No intraorbital lesions. CT cervical spine: No fracture or spondylolisthesis. Multilevel osteoarthritic change. Calcification right carotid artery. Electronically Signed   By: Lowella Grip III M.D.   On: 09/03/2016 11:40     Assessment/Plan   ICD-10-CM   1. Type 2 diabetes mellitus without complication, without long-term current use of insulin (HCC) E11.9 Hemoglobin A1c  2. Frequent falls R29.6   3. Altered gait R26.9   4. Mixed hyperlipidemia E78.2 Lipid Panel  5. High risk medication use Z79.899 Hepatic Function Panel  6. Seasonal allergic rhinitis, unspecified trigger with wheezing J30.2 loratadine (CLARITIN) 10 MG tablet  7. Tremor due to multiple drugs G25.1     Take claritin 30m daily for seasonal allergy  May use saline nasal spray as needed to keep nose moist  Depo-medrol 425minjection today  May gargle with warm salt water as needed for sore throat  Reassurance given for tremor. Likely med induced as she takes vyvanse and lamictal  Increase water intake to reduce elevated blood sugar after steroid injection  Continue current medications as ordered  Follow up with specialists as scheduled  Fasting labs next available  Follow up in 3-4 mos for DM, hyperlipidemia, falls   Lareen Mullings S. CaPerlie GoldPiMahoning Valley Amb[facility] Incnd [facility] [address] [id number] [phone] (Monday-Friday 8 AM - 5 PM) ([phone] 5 PM and follow prompts

## 2016-09-24 ENCOUNTER — Ambulatory Visit: Payer: Self-pay | Admitting: Internal Medicine

## 2016-09-25 ENCOUNTER — Other Ambulatory Visit: Payer: Medicare Other

## 2016-09-25 DIAGNOSIS — E119 Type 2 diabetes mellitus without complications: Secondary | ICD-10-CM

## 2016-09-25 DIAGNOSIS — E782 Mixed hyperlipidemia: Secondary | ICD-10-CM | POA: Diagnosis not present

## 2016-09-25 DIAGNOSIS — Z79899 Other long term (current) drug therapy: Secondary | ICD-10-CM

## 2016-09-25 LAB — HEPATIC FUNCTION PANEL
ALBUMIN: 4.3 g/dL (ref 3.6–5.1)
ALK PHOS: 78 U/L (ref 33–130)
ALT: 16 U/L (ref 6–29)
AST: 17 U/L (ref 10–35)
Bilirubin, Direct: 0.1 mg/dL (ref <=0.2)
Indirect Bilirubin: 0.3 mg/dL (ref 0.2–1.2)
TOTAL PROTEIN: 6.2 g/dL (ref 6.1–8.1)
Total Bilirubin: 0.4 mg/dL (ref 0.2–1.2)

## 2016-09-25 LAB — LIPID PANEL
CHOL/HDL RATIO: 4 ratio (ref <5.0)
Cholesterol: 214 mg/dL — ABNORMAL HIGH (ref <200)
HDL: 53 mg/dL (ref >50)
LDL CALC: 136 mg/dL — AB (ref <100)
TRIGLYCERIDES: 125 mg/dL (ref <150)
VLDL: 25 mg/dL (ref <30)

## 2016-09-26 LAB — HEMOGLOBIN A1C
Hgb A1c MFr Bld: 5.7 % — ABNORMAL HIGH (ref <5.7)
Mean Plasma Glucose: 117 mg/dL

## 2016-10-24 ENCOUNTER — Other Ambulatory Visit: Payer: Self-pay

## 2016-10-24 DIAGNOSIS — E119 Type 2 diabetes mellitus without complications: Secondary | ICD-10-CM

## 2016-10-24 MED ORDER — METFORMIN HCL 500 MG PO TABS: 500.0000 mg | ORAL_TABLET | Freq: Two times a day (BID) | ORAL | 4 refills | Status: DC

## 2016-10-24 NOTE — Telephone Encounter (Signed)
A refill request was received from [facility] for metformin 500 mg tablets. Rx was sent to pharmacy electronically.

## 2016-10-25 ENCOUNTER — Other Ambulatory Visit: Payer: Self-pay | Admitting: Internal Medicine

## 2016-10-25 DIAGNOSIS — E119 Type 2 diabetes mellitus without complications: Secondary | ICD-10-CM

## 2016-11-13 DIAGNOSIS — M542 Cervicalgia: Secondary | ICD-10-CM | POA: Diagnosis not present

## 2016-11-13 DIAGNOSIS — M4712 Other spondylosis with myelopathy, cervical region: Secondary | ICD-10-CM | POA: Diagnosis not present

## 2016-11-13 DIAGNOSIS — M9901 Segmental and somatic dysfunction of cervical region: Secondary | ICD-10-CM | POA: Diagnosis not present

## 2016-11-13 DIAGNOSIS — M9902 Segmental and somatic dysfunction of thoracic region: Secondary | ICD-10-CM | POA: Diagnosis not present

## 2016-11-19 ENCOUNTER — Telehealth: Payer: Self-pay | Admitting: *Deleted

## 2016-11-19 DIAGNOSIS — Z0100 Encounter for examination of eyes and vision without abnormal findings: Principal | ICD-10-CM

## 2016-11-19 DIAGNOSIS — M4802 Spinal stenosis, cervical region: Secondary | ICD-10-CM | POA: Diagnosis not present

## 2016-11-19 DIAGNOSIS — E119 Type 2 diabetes mellitus without complications: Secondary | ICD-10-CM

## 2016-11-19 NOTE — Telephone Encounter (Signed)
Please refer to ophthamology for diabetic eye exam

## 2016-11-19 NOTE — Telephone Encounter (Signed)
Patient called and wants a referral to a Opthalmologist. She wants your Opinion about one. She stated that she does not know a good one around here. Please advise.

## 2016-12-05 ENCOUNTER — Other Ambulatory Visit (HOSPITAL_COMMUNITY): Payer: Self-pay | Admitting: Psychiatry

## 2016-12-05 ENCOUNTER — Other Ambulatory Visit: Payer: Self-pay | Admitting: Internal Medicine

## 2016-12-05 DIAGNOSIS — J302 Other seasonal allergic rhinitis: Secondary | ICD-10-CM

## 2016-12-05 DIAGNOSIS — E119 Type 2 diabetes mellitus without complications: Secondary | ICD-10-CM

## 2016-12-15 ENCOUNTER — Ambulatory Visit: Payer: Medicare Other | Admitting: Neurology

## 2016-12-23 DIAGNOSIS — M4802 Spinal stenosis, cervical region: Secondary | ICD-10-CM | POA: Diagnosis not present

## 2016-12-23 DIAGNOSIS — M542 Cervicalgia: Secondary | ICD-10-CM | POA: Diagnosis not present

## 2016-12-24 ENCOUNTER — Other Ambulatory Visit: Payer: Self-pay | Admitting: *Deleted

## 2016-12-24 DIAGNOSIS — E119 Type 2 diabetes mellitus without complications: Secondary | ICD-10-CM

## 2016-12-24 MED ORDER — METFORMIN HCL 500 MG PO TABS: 500.0000 mg | ORAL_TABLET | Freq: Two times a day (BID) | ORAL | 4 refills | Status: DC

## 2016-12-24 MED ORDER — ONETOUCH ULTRA 2 W/DEVICE KIT: PACK | 0 refills | Status: DC

## 2016-12-24 NOTE — Telephone Encounter (Signed)
Patient requested a new blood sugar machine due to losing hers. Also wanted refill on her Metformin. Also needed an appointment scheduled for an abnormal growth on wrist.

## 2016-12-25 DIAGNOSIS — I10 Essential (primary) hypertension: Secondary | ICD-10-CM | POA: Diagnosis not present

## 2016-12-25 DIAGNOSIS — M4802 Spinal stenosis, cervical region: Secondary | ICD-10-CM | POA: Diagnosis not present

## 2016-12-29 ENCOUNTER — Ambulatory Visit
Admission: RE | Admit: 2016-12-29 | Discharge: 2016-12-29 | Disposition: A | Discharge status: Home / Self Care | Payer: Medicare Other | Source: Ambulatory Visit | Attending: Nurse Practitioner | Admitting: Nurse Practitioner

## 2016-12-29 ENCOUNTER — Encounter: Payer: Self-pay | Admitting: Nurse Practitioner

## 2016-12-29 ENCOUNTER — Ambulatory Visit (INDEPENDENT_AMBULATORY_CARE_PROVIDER_SITE_OTHER): Payer: Medicare Other | Admitting: Nurse Practitioner

## 2016-12-29 VITALS — BP 154/88 | HR 78 | Temp 98.1°F | Resp 17 | Ht 63.0 in | Wt 187.0 lb

## 2016-12-29 DIAGNOSIS — R2231 Localized swelling, mass and lump, right upper limb: Secondary | ICD-10-CM

## 2016-12-29 DIAGNOSIS — M25531 Pain in right wrist: Secondary | ICD-10-CM | POA: Diagnosis not present

## 2016-12-29 MED ORDER — BUPROPION HCL ER (SR) 150 MG PO TB12: 150.0000 mg | ORAL_TABLET | Freq: Two times a day (BID) | ORAL | 1 refills | Status: DC

## 2016-12-29 MED ORDER — GABAPENTIN 300 MG PO CAPS: 300.0000 mg | ORAL_CAPSULE | Freq: Two times a day (BID) | ORAL | 1 refills | Status: DC

## 2016-12-29 MED ORDER — METFORMIN HCL 500 MG PO TABS: 500.0000 mg | ORAL_TABLET | Freq: Two times a day (BID) | ORAL | 1 refills | Status: DC

## 2016-12-29 MED ORDER — LORATADINE 10 MG PO TABS: 10.0000 mg | ORAL_TABLET | Freq: Every day | ORAL | 1 refills | Status: DC

## 2016-12-29 NOTE — Progress Notes (Signed)
Careteam: Patient Care Team: Gildardo Cranker, DO as PCP - General (Internal Medicine) Brien Few, MD as Consulting Physician (Obstetrics and Gynecology)  Advanced Directive information Does Patient Have a Medical Advance Directive?: No  Allergies  Allergen Reactions  . Oxycodone     Migraines  . Statins     "makes my urine turn brown"    Chief Complaint  Patient presents with  . Acute Visit    Pt is being seen for small, white knots on right wrist that are not painful or movable. Pt noticed them 3 days ago.      HPI: Patient is a 66 y.o. female seen in the office today due to bump on wrist, right.  Noticed this 5 days ago. It was visible. Now she has been rubbing on it. Does not cause her pain but knows its there.  Does not effect her grips. No swelling. Has fallen in the past but does not think she has hit her wrist.   Hx of bone spurs and narrow spinal canal on MRI on Wednesday by neurologist, recommended surgery. Seeing Dr Annette Stable for this.   Review of Systems:  Review of Systems  Constitutional: Positive for malaise/fatigue. Negative for chills, fever and weight loss.  HENT: Negative for tinnitus.   Musculoskeletal: Positive for falls. Negative for back pain, joint pain and myalgias.  Skin: Negative.   Neurological: Positive for dizziness, weakness and headaches. Negative for tingling, speech change, seizures and loss of consciousness.  Psychiatric/Behavioral: Positive for depression. Negative for memory loss. The patient is nervous/anxious. The patient does not have insomnia.     Past Medical History:  Diagnosis Date  . Anxiety   . Bipolar 1 disorder ([place])   . Bulging of cervical intervertebral disc    C2  . Bulging of thoracic intervertebral disc    t4  . Depression   . Diabetes mellitus without complication ([place])   . Hyperlipidemia   . PTSD (post-traumatic stress disorder)    Past Surgical History:  Procedure Laterality Date  . HEMICOLECTOMY  1997    . JOINT REPLACEMENT  2013   hip   Social History:   reports that she has never smoked. She has never used smokeless tobacco. She reports that she does not drink alcohol or use drugs.  Family History  Problem Relation Age of Onset  . Ovarian cancer Mother   . Lymphoma Father   . Colon cancer Sister     Medications: Patient's Medications  New Prescriptions   No medications on file  Previous Medications   ASCORBIC ACID (VITAMIN C) 1000 MG TABLET    Take 1,000 mg by mouth daily.   BLOOD GLUCOSE MONITORING SUPPL (ONE TOUCH ULTRA 2) W/DEVICE KIT    Use to test blood sugar once daily. Dx E11.9   BUPROPION (WELLBUTRIN SR) 150 MG 12 HR TABLET    Take 150 mg by mouth 2 (two) times daily.   CLONAZEPAM (KLONOPIN) 1 MG TABLET    Take 0.5-1 mg by mouth 2 (two) times daily as needed.    GABAPENTIN (NEURONTIN) 300 MG CAPSULE    Take 1 capsule (300 mg total) by mouth 2 (two) times daily.   GLUCOSE BLOOD TEST STRIP    One Touch Ultra test Strips Use to check blood up to three times daily. Dx E11.9   IBUPROFEN (ADVIL,MOTRIN) 600 MG TABLET    Take 1 tablet (600 mg total) by mouth every 6 (six) hours as needed.   LAMOTRIGINE (LAMICTAL) 200  MG TABLET    Take 200 mg by mouth 2 (two) times daily.    LISDEXAMFETAMINE (VYVANSE) 40 MG CAPSULE    Take 40 mg by mouth every morning.   LORATADINE (CLARITIN) 10 MG TABLET    TAKE 1 TABLET BY MOUTH EVERY DAY   METFORMIN (GLUCOPHAGE) 500 MG TABLET    Take 1 tablet (500 mg total) by mouth 2 (two) times daily with a meal.   ONE TOUCH LANCETS MISC    Use to test blood sugars twice daily DX: E 11.9   PROBIOTIC PRODUCT (SOLUBLE FIBER/PROBIOTICS) CHEW    Chew 1 tablet by mouth daily.   PROLINE PO    Take by mouth daily.   TRAZODONE (DESYREL) 50 MG TABLET    Take 25 mg by mouth at bedtime as needed.   Modified Medications   No medications on file  Discontinued Medications   METHOCARBAMOL (ROBAXIN) 500 MG TABLET    Take 1 tablet (500 mg total) by mouth 2 (two) times  daily.     Physical Exam:  Vitals:   12/29/16 1513  BP: (!) 154/88  Pulse: 78  Resp: 17  Temp: 98.1 F (36.7 C)  TempSrc: Oral  SpO2: 98%  Weight: 187 lb (84.8 kg)  Height: '5\' 3"'  (1.6 m)   Body mass index is 33.13 kg/m.  Physical Exam  Constitutional: She appears well-developed and well-nourished.  HENT:  Mouth/Throat: Oropharynx is clear and moist. No oropharyngeal exudate.  Eyes: Pupils are equal, round, and reactive to light. No scleral icterus.  Neck: Neck supple. Carotid bruit is not present. No tracheal deviation present.  Cardiovascular: Normal rate, regular rhythm and normal heart sounds.   Pulmonary/Chest: Effort normal and breath sounds normal. No stridor.  Abdominal: Soft. Bowel sounds are normal. There is no hepatomegaly.  Musculoskeletal:  No visible lump or bump appreciated. Pulses normal. Small Scar noted along area patient points out. No obvious lump or bump noted.   Lymphadenopathy:    She has no cervical adenopathy.  Neurological: She is alert. She has normal strength. No cranial nerve deficit. Gait abnormal.  Skin: Skin is warm and dry.  Psychiatric: Her behavior is normal. Thought content normal. Her mood appears anxious. Her speech is tangential.   Labs reviewed: Basic Metabolic Panel:  Recent Labs  01/04/16 1001 09/03/16 1050  NA 137 138  K 4.5 4.3  CL 100 105  CO2 27 25  GLUCOSE 137* 190*  BUN 23 14  CREATININE 1.04* 0.88  CALCIUM 9.5 8.9  TSH 1.61  --    Liver Function Tests:  Recent Labs  01/04/16 1001 09/25/16 0853  AST 20 17  ALT 13 16  ALKPHOS 81 78  BILITOT 0.4 0.4  PROT 6.7 6.2  ALBUMIN 4.4 4.3   No results for input(s): LIPASE, AMYLASE in the last 8760 hours. No results for input(s): AMMONIA in the last 8760 hours. CBC:  Recent Labs  01/04/16 1001 09/03/16 1050  WBC 6.0 5.6  NEUTROABS 4,440  --   HGB 13.9 12.2  HCT 41.6 37.4  MCV 88.3 88.8  PLT 231 199   Lipid Panel:  Recent Labs  01/04/16 1001  09/25/16 0853  CHOL 239* 214*  HDL 60 53  LDLCALC 151* 136*  TRIG 139 125  CHOLHDL 4.0 4.0   TSH:  Recent Labs  01/04/16 1001  TSH 1.61   A1C: Lab Results  Component Value Date   HGBA1C 5.7 (H) 09/25/2016     Assessment/Plan 1. Wrist lump,  right -reassurance given, no pain or discomfort noted. No swelling or abnormality appreciated.  - DG Wrist Complete Right; Future for further evaluation.   Next appt: to keep appt with Dr Eulas Post as scheduled.  Carlos American. Harle Battiest  [facility] [phone] 8 am - 5 pm) [phone] (after hours)

## 2016-12-29 NOTE — Patient Instructions (Signed)
Keep appt with Dr Eulas Post as schedule

## 2016-12-30 ENCOUNTER — Other Ambulatory Visit: Payer: Self-pay | Admitting: *Deleted

## 2016-12-30 MED ORDER — ONETOUCH ULTRA 2 W/DEVICE KIT: PACK | 0 refills | Status: DC

## 2016-12-30 NOTE — Telephone Encounter (Signed)
Patient called and stated that pharmacy never received Rx for blood sugar machine. Refaxed.

## 2017-01-06 ENCOUNTER — Encounter: Payer: Self-pay | Admitting: Internal Medicine

## 2017-01-06 ENCOUNTER — Ambulatory Visit (INDEPENDENT_AMBULATORY_CARE_PROVIDER_SITE_OTHER): Payer: Medicare Other | Admitting: Internal Medicine

## 2017-01-06 VITALS — BP 142/86 | HR 93 | Temp 98.2°F | Resp 12 | Ht 63.0 in | Wt 190.0 lb

## 2017-01-06 DIAGNOSIS — E2839 Other primary ovarian failure: Secondary | ICD-10-CM

## 2017-01-06 DIAGNOSIS — Z1211 Encounter for screening for malignant neoplasm of colon: Secondary | ICD-10-CM

## 2017-01-06 DIAGNOSIS — E119 Type 2 diabetes mellitus without complications: Secondary | ICD-10-CM | POA: Diagnosis not present

## 2017-01-06 DIAGNOSIS — E782 Mixed hyperlipidemia: Secondary | ICD-10-CM | POA: Diagnosis not present

## 2017-01-06 DIAGNOSIS — G894 Chronic pain syndrome: Secondary | ICD-10-CM

## 2017-01-06 DIAGNOSIS — Z79899 Other long term (current) drug therapy: Secondary | ICD-10-CM

## 2017-01-06 LAB — COLOGUARD

## 2017-01-06 MED ORDER — ZOSTER VAC RECOMB ADJUVANTED 50 MCG/0.5ML IM SUSR: 0.5000 mL | Freq: Once | INTRAMUSCULAR | 1 refills | Status: AC

## 2017-01-06 MED ORDER — GABAPENTIN 300 MG PO CAPS: 300.0000 mg | ORAL_CAPSULE | Freq: Three times a day (TID) | ORAL | 3 refills | Status: DC

## 2017-01-06 NOTE — Progress Notes (Signed)
Patient ID: Kaitlyn Wood, female   DOB: Oct 25, 1950, 66 y.o.   MRN: [id number]   Location:      Place of Service:    Provider:   Patient Care Team: Gildardo Cranker, DO as PCP - General (Internal Medicine) Brien Few, MD as Consulting Physician (Obstetrics and Gynecology)  Extended Emergency Contact Information Primary Emergency Contact: [place], [place] Phone: [phone] Relation: Son  Code Status:  Goals of Care: Advanced Directive information Advanced Directives 12/29/2016  Does Patient Have a Medical Advance Directive? No  Would patient like information on creating a medical advance directive? -     Chief Complaint  Patient presents with  . Medical Management of Chronic Issues    3-4 month follow-up on DM, High cholosterol and falls. + depression screening (scored 18). DUE for foot exam  . Medication Refill    No refills needed at this time   . Health Maintenance    BMD ordered, prefers cologuard vs colonoscopy, and will see eye doctor Friday 01/09/17 (will request report to be sent)  . Immunizations    Rx for shingles printed, patient will schedule nurse visit for next week for pneu and flu vaccine     HPI: Patient is a 66 y.o. female seen in today for an annual wellness exam. Patient states her orthopedic surgeon wants to do spinal fusion to her neck. She is very concerned about her neck. The patient is tearful and feels that she is over burned with stress.  She states he son is bipolar but refuses to get treatment for it, and is upset that he wont come see her. She states that she does not see psychiatry and has no in years. She said she would like to have referral to set up psychiatric appointment. Patient has been seen by [facility] for management of   Bipolar d/o/anxiety/insomnia/PTSD - she is followed by psych. Current provider is Dr Casimiro Needle. She is taking wellbutrin sr, klonopin, neurontin, lamictal,  latuda and trazodone. She had pt assistance in the past and is awaiting approval. She is followed by [facility].  Hyperlipidemia - she reports intolerance to statins as they caused "my urine to turn brown". She is hesitant to try any other medications.   DM - she takes metformin daily. BS are normal range and no low BS reactions. Last A1c 6%. She has not had any numbness/tingling. She saw eye Dr in May and was told no diabetic changes. Urine microalbumin/creatinine ratio 5. Cr 1.04  Cervical/lumbar herniated/bulging discs - C2-T4; she reports no significant pain at this time. She has never had sx. Initial injury was falling off porch with whiplash injury many years ago. She had an episode when her leg gave away when she went to get out of her car.   FHx cancer - Mother with ovarian cancer; sister/MGM with colorectal cancer; Father with Lymphoma  Depression screen [facility] 2/9 01/06/2017 01/04/2016  Decreased Interest 3 1  Down, Depressed, Hopeless 3 1  PHQ - 2 Score 6 2  Altered sleeping 3 -  Tired, decreased energy 3 -  Change in appetite 3 -  Feeling bad or failure about yourself  3 -  Trouble concentrating 0 -  Moving slowly or fidgety/restless 0 -  Suicidal thoughts 0 -  PHQ-9 Score 18 -    Fall Risk  01/06/2017 12/29/2016 09/08/2016 04/04/2016 01/04/2016  Falls in the past year? Yes Yes Yes  Yes Yes  Number falls in past yr: 2 or more 2 or more 2 or more 2 or more 2 or more  Injury with Fall? Yes Yes Yes Yes No  Comment Head injury once  - - - -   MMSE - Mini Mental State Exam 04/04/2016  Orientation to time 5  Orientation to Place 5  Registration 3  Attention/ Calculation 5  Recall 2  Language- name 2 objects 2  Language- repeat 1  Language- follow 3 step command 3  Language- read & follow direction 1  Write a sentence 1  Copy design 1  Total score 29     Health Maintenance  Topic Date Due  . FOOT EXAM  05/01/1959  . OPHTHALMOLOGY EXAM  05/01/1959  . DEXA SCAN   04/30/2014  . INFLUENZA VACCINE  10/29/2016  . URINE MICROALBUMIN  01/03/2017  . PNA vac Low Risk Adult (2 of 2 - PPSV23) 01/03/2017  . COLONOSCOPY  03/31/2017 (Originally 05/01/1999)  . HEMOGLOBIN A1C  03/27/2017  . MAMMOGRAM  12/03/2017  . TETANUS/TDAP  09/28/2025  . Hepatitis C Screening  Completed    Urinary incontinence? Functional Status Survey:   Exercise? Diet? No exam data present Hearing:   Dentition: Pain:  Past Medical History:  Diagnosis Date  . Anxiety   . Bipolar 1 disorder ([place])   . Bulging of cervical intervertebral disc    C2  . Bulging of thoracic intervertebral disc    t4  . Depression   . Diabetes mellitus without complication ([place])   . Hyperlipidemia   . PTSD (post-traumatic stress disorder)     Past Surgical History:  Procedure Laterality Date  . HEMICOLECTOMY  1997  . JOINT REPLACEMENT  2013   hip    Family History  Problem Relation Age of Onset  . Ovarian cancer Mother   . Lymphoma Father   . Colon cancer Sister     Social History   Social History  . Marital status: Divorced    Spouse name: N/A  . Number of children: N/A  . Years of education: N/A   Social History Main Topics  . Smoking status: Never Smoker  . Smokeless tobacco: Never Used  . Alcohol use No  . Drug use: No  . Sexual activity: Not Currently   Other Topics Concern  . None   Social History Narrative   Diet? ? Fresh food- very little prepared food- no fast food.       Do you drink/eat things with caffeine?  Yes      Marital status?         Divorced                           What year were you married? 1978      Do you live in a house, apartment, assisted living, condo, trailer, etc.? apartment      Is it one or more stories? Yes      How many persons live in your home? 1      Do you have any pets in your home? (please list)  1 cat, Polly      Current or past profession: Air traffic controller, sales       Do you exercise?       yes  Type & how often? 2-3 times a week      Do you have a living will? No      Do you have a DNR form?    no                              If not, do you want to discuss one? yes      Do you have signed POA/HPOA for forms? no    reports that she has never smoked. She has never used smokeless tobacco. She reports that she does not drink alcohol or use drugs.   Allergies  Allergen Reactions  . Oxycodone     Migraines  . Statins     "makes my urine turn brown"    Outpatient Encounter Prescriptions as of 01/06/2017  Medication Sig  . Ascorbic Acid (VITAMIN C) 1000 MG tablet Take 1,000 mg by mouth daily.  . Blood Glucose Monitoring Suppl (ONE TOUCH ULTRA 2) w/Device KIT Use to test blood sugar once daily. Dx E11.9  . buPROPion (WELLBUTRIN SR) 150 MG 12 hr tablet Take 1 tablet (150 mg total) by mouth 2 (two) times daily.  . clonazePAM (KLONOPIN) 1 MG tablet Take 0.5-1 mg by mouth 2 (two) times daily as needed.   . gabapentin (NEURONTIN) 300 MG capsule Take 1 capsule (300 mg total) by mouth 2 (two) times daily.  Marland Kitchen glucose blood test strip One Touch Ultra test Strips Use to check blood up to three times daily. Dx E11.9  . ibuprofen (ADVIL,MOTRIN) 600 MG tablet Take 1 tablet (600 mg total) by mouth every 6 (six) hours as needed.  . lamoTRIgine (LAMICTAL) 200 MG tablet Take 200 mg by mouth 2 (two) times daily.   Marland Kitchen lisdexamfetamine (VYVANSE) 40 MG capsule Take 40 mg by mouth every morning.  . loratadine (CLARITIN) 10 MG tablet Take 1 tablet (10 mg total) by mouth daily.  . metFORMIN (GLUCOPHAGE) 500 MG tablet Take 1 tablet (500 mg total) by mouth 2 (two) times daily with a meal.  . ONE TOUCH LANCETS MISC Use to test blood sugars twice daily DX: E 11.9  . Probiotic Product (SOLUBLE FIBER/PROBIOTICS) CHEW Chew 1 tablet by mouth daily.  Marland Kitchen PROLINE PO Take by mouth daily.  . traZODone (DESYREL) 50 MG tablet Take 25 mg by mouth at bedtime as needed.   . Zoster Vac Recomb Adjuvanted  [facility]) injection Inject 0.5 mLs into the muscle once.  . [DISCONTINUED] Zoster Vac Recomb Adjuvanted (SHINGRIX) injection Inject 0.5 mLs into the muscle once.   No facility-administered encounter medications on file as of 01/06/2017.      Review of Systems:  Review of Systems  Constitutional: Negative for activity change, appetite change, chills, fatigue and fever.  HENT: Negative for congestion, dental problem, ear pain, mouth sores, nosebleeds, postnasal drip, rhinorrhea, sinus pain, sneezing and sore throat.   Eyes: Negative for pain, discharge, itching and visual disturbance.  Respiratory: Negative for apnea, cough, choking, chest tightness, shortness of breath, wheezing and stridor.   Cardiovascular: Negative for chest pain, palpitations and leg swelling.  Gastrointestinal: Negative for abdominal distention, abdominal pain, anal bleeding, blood in stool, constipation, diarrhea, nausea and vomiting.  Endocrine: Negative for cold intolerance, heat intolerance and polyuria.  Genitourinary: Negative for decreased urine volume, difficulty urinating, dyspareunia, dysuria, flank pain, frequency, hematuria and urgency.  Musculoskeletal: Positive for arthralgias (cervical) and neck pain. Negative for back pain, gait problem, joint swelling, myalgias  and neck stiffness.  Skin: Negative for color change, pallor and rash.  Neurological: Negative for dizziness, tremors, facial asymmetry, weakness, light-headedness, numbness and headaches.  Hematological: Negative for adenopathy.  Psychiatric/Behavioral: Positive for dysphoric mood. Negative for agitation, behavioral problems, confusion, decreased concentration, self-injury, sleep disturbance and suicidal ideas.    Physical Exam: Vitals:   01/06/17 1425  BP: (!) 142/86  Pulse: 93  Resp: 12  Temp: 98.2 F (36.8 C)  TempSrc: Oral  SpO2: 96%  Weight: 190 lb (86.2 kg)  Height: _0  (1.6 m)   Body mass index is 33.66 kg/m. Physical Exam    Constitutional: She is oriented to person, place, and time. She appears well-developed and well-nourished. No distress.  HENT:  Head: Normocephalic and atraumatic.  Right Ear: External ear normal.  Left Ear: External ear normal.  Nose: Nose normal.  Mouth/Throat: Oropharynx is clear and moist. No oropharyngeal exudate.  Eyes: Pupils are equal, round, and reactive to light. Conjunctivae are normal. Right eye exhibits no discharge. Left eye exhibits no discharge. No scleral icterus.  Neck: Normal range of motion. Neck supple. No JVD present. No tracheal deviation present.  Cardiovascular: Normal rate, regular rhythm, normal heart sounds and intact distal pulses.  Exam reveals no gallop and no friction rub.   No murmur heard. Pulmonary/Chest: Effort normal and breath sounds normal. No stridor. No respiratory distress. She has no wheezes. She has no rales. She exhibits no tenderness.  Abdominal: Soft. Bowel sounds are normal. She exhibits no distension and no mass. There is no tenderness. There is no rebound and no guarding.  Musculoskeletal: She exhibits edema (bilateral lower edema +1 non pitting).  Lymphadenopathy:    She has no cervical adenopathy.  Neurological: She is alert and oriented to person, place, and time. No cranial nerve deficit.  Skin: Skin is warm and dry. No rash noted. She is not diaphoretic. No erythema. No pallor.  Psychiatric: Her speech is normal and behavior is normal. Judgment and thought content normal. Cognition and memory are normal. She exhibits a depressed mood.    Labs reviewed: Basic Metabolic Panel:  Recent Labs  09/03/16 1050  NA 138  K 4.3  CL 105  CO2 25  GLUCOSE 190*  BUN 14  CREATININE 0.88  CALCIUM 8.9   Liver Function Tests:  Recent Labs  09/25/16 0853  AST 17  ALT 16  ALKPHOS 78  BILITOT 0.4  PROT 6.2  ALBUMIN 4.3   No results for input(s): LIPASE, AMYLASE in the last 8760 hours. No results for input(s): AMMONIA in the last 8760  hours. CBC:  Recent Labs  09/03/16 1050  WBC 5.6  HGB 12.2  HCT 37.4  MCV 88.8  PLT 199   Lipid Panel:  Recent Labs  09/25/16 0853  CHOL 214*  HDL 53  LDLCALC 136*  TRIG 125  CHOLHDL 4.0   Lab Results  Component Value Date   HGBA1C 5.7 (H) 09/25/2016    Procedures: Dg Wrist Complete Right  Result Date: 12/30/2016 CLINICAL DATA:  66 year old female with knot under skin for the past 4 days near radial styloid. No trauma. Initial encounter. EXAM: RIGHT WRIST - COMPLETE 3+ VIEW COMPARISON:  None. FINDINGS: Minimal bony protuberance distal right radius (radial aspect) where patient is feeling a knot. This probably represents a normal finding without underlying osseous lesion. It is possible that the crossing tendons are irritated contributing to patient's symptoms. If further delineation were clinically desired MR may then be considered. Remainder of exam  unremarkable. IMPRESSION: Minimal bony protuberance distal right radius (radial aspect) where patient is feeling a knot. This probably represents a normal finding without underlying osseous lesion. It is possible that the crossing tendons are irritated contributing to patient's symptoms. If further delineation were clinically desired MR may then be considered. Electronically Signed   By: Genia Del M.D.   On: 12/30/2016 09:14    Assessment/Plan     Labs/tests ordered:   Next appointment:   Kaitlyn Wood. Kaitlyn Wood, Student AGACNP

## 2017-01-06 NOTE — Progress Notes (Addendum)
Patient ID: Kaitlyn Wood, female   DOB: [birth year], 66 y.o.   MRN: [id number]    Location:  [facility] Place of Service: OFFICE  Chief Complaint  Patient presents with  . Medical Management of Chronic Issues    3-4 month follow-up on DM, High cholosterol and falls. + depression screening (scored 18). DUE for foot exam  . Medication Refill    No refills needed at this time   . Health Maintenance    BMD ordered, prefers cologuard vs colonoscopy, and will see eye doctor Friday 01/09/17 (will request report to be sent)  . Immunizations    Rx for shingles printed, patient will schedule nurse visit for next week for pneu and flu vaccine     HPI:  66 yo female seen today for f/u. She c/o uncontrolled pain in her neck and back. She has occasional numbness/tingling. No other concerns. She is a poor historian due to psych d/o. Hx obtained from chart  She relocated from [place], [place] in June 2017.  Urinary incontinence - urgency is stable. She was told by GYN that she has prolapsed uterus.  Bipolar d/o/anxiety/insomnia/PTSD - she is followed by psych Dr Casimiro Needle. She is taking wellbutrin sr, klonopin, neurontin, lamictal, vyvanse and trazodone. She no longer sees a therapist as her last one is no longer taking her insurance.  Hyperlipidemia - she reports intolerance to statins as they caused "my urine to turn brown". She is hesitant to try any other medications. She states LDL was 180 in the past.  Current LDL 136; Tchol 214; HDL 53  DM - she takes metformin daily. BS are normal range and no low BS reactions. Last A1c 5.7%. She has not had any numbness/tingling. She saw eye Dr in May and was told no diabetic changes. Urine microalbumin/creatinine ratio 5. Cr 1.04  Cervical/lumbar herniated/bulging discs - C2-T4; she reports no significant pain at this time. She has never had sx. Initial injury was falling off porch with whiplash injury many years ago. She had an episode when her leg gave away when she went  to get out of her car. Gabapentin helps pain.  FHx cancer - Mother with ovarian cancer; sister/MGM with colorectal cancer; Father with Lymphoma  Hep C nonreactive   Past Medical History:  Diagnosis Date  . Anxiety   . Bipolar 1 disorder ([place])   . Bulging of cervical intervertebral disc    C2  . Bulging of thoracic intervertebral disc    t4  . Depression   . Diabetes mellitus without complication ([place])   . Hyperlipidemia   . PTSD (post-traumatic stress disorder)     Past Surgical History:  Procedure Laterality Date  . HEMICOLECTOMY  1997  . JOINT REPLACEMENT  2013   hip    Patient Care Team: Gildardo Cranker, DO as PCP - General (Internal Medicine) Brien Few, MD as Consulting Physician (Obstetrics and Gynecology)  Social History   Social History  . Marital status: Divorced    Spouse name: N/A  . Number of children: N/A  . Years of education: N/A   Occupational History  . Not on file.   Social History Main Topics  . Smoking status: Never Smoker  . Smokeless tobacco: Never Used  . Alcohol use No  . Drug use: No  . Sexual activity: Not Currently   Other Topics Concern  . Not on file   Social History Narrative   Diet? ? Fresh food- very little prepared food- no fast food.  Do you drink/eat things with caffeine?  Yes      Marital status?         Divorced                           What year were you married? 1978      Do you live in a house, apartment, assisted living, condo, trailer, etc.? apartment      Is it one or more stories? Yes      How many persons live in your home? 1      Do you have any pets in your home? (please list)  1 cat, Polly      Current or past profession: Air traffic controller, sales       Do you exercise?       yes                               Type & how often? 2-3 times a week      Do you have a living will? No      Do you have a DNR form?    no                              If not, do you want to discuss one? yes       Do you have signed POA/HPOA for forms? no     reports that she has never smoked. She has never used smokeless tobacco. She reports that she does not drink alcohol or use drugs.  Family History  Problem Relation Age of Onset  . Ovarian cancer Mother   . Lymphoma Father   . Colon cancer Sister    Family Status  Relation Status  . Mother Deceased  . Father Deceased  . Sister Deceased at age 22  . Sister Alive  . Son Alive  . Daughter Alive  . Son Alive  . Son Deceased at age 64       Heroin/clonazipan overdose     Allergies  Allergen Reactions  . Oxycodone     Migraines  . Statins     "makes my urine turn brown"    Medications: Patient's Medications  New Prescriptions   No medications on file  Previous Medications   ASCORBIC ACID (VITAMIN C) 1000 MG TABLET    Take 1,000 mg by mouth daily.   BLOOD GLUCOSE MONITORING SUPPL (ONE TOUCH ULTRA 2) W/DEVICE KIT    Use to test blood sugar once daily. Dx E11.9   BUPROPION (WELLBUTRIN SR) 150 MG 12 HR TABLET    Take 1 tablet (150 mg total) by mouth 2 (two) times daily.   CLONAZEPAM (KLONOPIN) 1 MG TABLET    Take 0.5-1 mg by mouth 2 (two) times daily as needed.    GABAPENTIN (NEURONTIN) 300 MG CAPSULE    Take 1 capsule (300 mg total) by mouth 2 (two) times daily.   GLUCOSE BLOOD TEST STRIP    One Touch Ultra test Strips Use to check blood up to three times daily. Dx E11.9   IBUPROFEN (ADVIL,MOTRIN) 600 MG TABLET    Take 1 tablet (600 mg total) by mouth every 6 (six) hours as needed.   LAMOTRIGINE (LAMICTAL) 200 MG TABLET    Take 200 mg by mouth 2 (two) times daily.    LISDEXAMFETAMINE (VYVANSE) 40  MG CAPSULE    Take 40 mg by mouth every morning.   LORATADINE (CLARITIN) 10 MG TABLET    Take 1 tablet (10 mg total) by mouth daily.   METFORMIN (GLUCOPHAGE) 500 MG TABLET    Take 1 tablet (500 mg total) by mouth 2 (two) times daily with a meal.   ONE TOUCH LANCETS MISC    Use to test blood sugars twice daily DX: E 11.9   PROBIOTIC  PRODUCT (SOLUBLE FIBER/PROBIOTICS) CHEW    Chew 1 tablet by mouth daily.   PROLINE PO    Take by mouth daily.   TRAZODONE (DESYREL) 50 MG TABLET    Take 25 mg by mouth at bedtime as needed.   Modified Medications   Modified Medication Previous Medication   ZOSTER VAC RECOMB ADJUVANTED (SHINGRIX) INJECTION Zoster Vac Recomb Adjuvanted (SHINGRIX) injection      Inject 0.5 mLs into the muscle once.    Inject 0.5 mLs into the muscle once.  Discontinued Medications   No medications on file    Review of Systems  Unable to perform ROS: Psychiatric disorder    Vitals:   01/06/17 1425  BP: (!) 142/86  Pulse: 93  Resp: 12  Temp: 98.2 F (36.8 C)  TempSrc: Oral  SpO2: 96%  Weight: 190 lb (86.2 kg)  Height: '5\' 3"'  (1.6 m)   Body mass index is 33.66 kg/m.  Physical Exam  Constitutional: She is oriented to person, place, and time. She appears well-developed and well-nourished.  HENT:  Mouth/Throat: Oropharynx is clear and moist. No oropharyngeal exudate.  MMM; no oral thrush  Eyes: Pupils are equal, round, and reactive to light. No scleral icterus.  Neck: Neck supple. Muscular tenderness present. Carotid bruit is not present. Decreased range of motion present. No tracheal deviation present. No thyromegaly present.    Cardiovascular: Normal rate, regular rhythm and intact distal pulses.  Exam reveals no gallop and no friction rub.   Murmur (1/6 SEM) heard. No LE edema b/l. no calf TTP.   Pulmonary/Chest: Effort normal and breath sounds normal. No stridor. No respiratory distress. She has no wheezes. She has no rales.  Abdominal: Soft. Normal appearance and bowel sounds are normal. She exhibits no distension and no mass. There is no hepatomegaly. There is no tenderness. There is no rigidity, no rebound and no guarding. No hernia.  Musculoskeletal: She exhibits edema and tenderness.  L>R medial knee lipoma; large left lateral malleolus lipoma, NT  Lymphadenopathy:    She has no cervical  adenopathy.  Neurological: She is alert and oriented to person, place, and time.  Skin: Skin is warm and dry. No rash noted.  Psychiatric: She has a normal mood and affect. Her behavior is normal. Thought content normal.     Labs reviewed: No visits with results within 3 Month(s) from this visit.  Latest known visit with results is:  Appointment on 09/25/2016  Component Date Value Ref Range Status  . Hgb A1c MFr Bld 09/25/2016 5.7* <5.7 % Final   Comment:   For someone without known diabetes, a hemoglobin A1c value between 5.7% and 6.4% is consistent with prediabetes and should be confirmed with a follow-up test.   For someone with known diabetes, a value <7% indicates that their diabetes is well controlled. A1c targets should be individualized based on duration of diabetes, age, co-morbid conditions and other considerations.   This assay result is consistent with an increased risk of diabetes.   Currently, no consensus exists regarding use  of hemoglobin A1c for diagnosis of diabetes in children.     . Mean Plasma Glucose 09/25/2016 117  mg/dL Final  . Cholesterol 09/25/2016 214* <200 mg/dL Final  . Triglycerides 09/25/2016 125  <150 mg/dL Final  . HDL 09/25/2016 53  >50 mg/dL Final  . Total CHOL/HDL Ratio 09/25/2016 4.0  <5.0 Ratio Final  . VLDL 09/25/2016 25  <30 mg/dL Final  . LDL Cholesterol 09/25/2016 136* <100 mg/dL Final  . Total Bilirubin 09/25/2016 0.4  0.2 - 1.2 mg/dL Final  . Bilirubin, Direct 09/25/2016 0.1  <=0.2 mg/dL Final  . Indirect Bilirubin 09/25/2016 0.3  0.2 - 1.2 mg/dL Final  . Alkaline Phosphatase 09/25/2016 78  33 - 130 U/L Final  . AST 09/25/2016 17  10 - 35 U/L Final  . ALT 09/25/2016 16  6 - 29 U/L Final  . Total Protein 09/25/2016 6.2  6.1 - 8.1 g/dL Final  . Albumin 09/25/2016 4.3  3.6 - 5.1 g/dL Final    Dg Wrist Complete Right  Result Date: 12/30/2016 CLINICAL DATA:  66 year old female with knot under skin for the past 4 days near radial  styloid. No trauma. Initial encounter. EXAM: RIGHT WRIST - COMPLETE 3+ VIEW COMPARISON:  None. FINDINGS: Minimal bony protuberance distal right radius (radial aspect) where patient is feeling a knot. This probably represents a normal finding without underlying osseous lesion. It is possible that the crossing tendons are irritated contributing to patient's symptoms. If further delineation were clinically desired MR may then be considered. Remainder of exam unremarkable. IMPRESSION: Minimal bony protuberance distal right radius (radial aspect) where patient is feeling a knot. This probably represents a normal finding without underlying osseous lesion. It is possible that the crossing tendons are irritated contributing to patient's symptoms. If further delineation were clinically desired MR may then be considered. Electronically Signed   By: Genia Del M.D.   On: 12/30/2016 09:14     Assessment/Plan   ICD-10-CM   1. High risk medication use Z79.899 CMP with eGFR  2. Chronic pain syndrome G89.4 gabapentin (NEURONTIN) 300 MG capsule   cervical and lumbar spine  3. Mixed hyperlipidemia E78.2 Lipid Panel    TSH  4. Type 2 diabetes mellitus without complication, without long-term current use of insulin (HCC) E11.9 Microalbumin/Creatinine Ratio, Urine    Hemoglobin A1c  5. Estrogen deficiency E28.39   6. Colon cancer screening Z12.11    Will call with bone density appt  COLOGUARD FOR COLON CANCER SCREENING  shingrix script given today  Influenza vaccine recommended - ok for nurse room visit appt  Increase gabapentin 368m TID for better pain control  Continue other medications as ordered  Follow up with specialists as scheduled  Return to office for fasting labs in next 1-2 weeks  Follow up in 3 mos for AWV/CPE    Hughey Rittenberry S. CPerlie Gold PTyler [facility] [address] [place] [phone] (Monday-Friday 8 AM - 5  PM) ([phone] 5 PM and follow prompts

## 2017-01-06 NOTE — Patient Instructions (Addendum)
Will call with bone density appt  COLOGUARD FOR COLON CANCER SCREENING  shingrix script given today  Influenza vaccine recommended - ok for nurse room visit appt  Continue other medications as ordered  Follow up with specialists as scheduled  Return to office for fasting labs in next 1-2 weeks  Follow up in 3 mos for AWV/CPE

## 2017-01-10 ENCOUNTER — Other Ambulatory Visit (HOSPITAL_COMMUNITY): Payer: Self-pay | Admitting: Psychiatry

## 2017-01-13 ENCOUNTER — Ambulatory Visit: Payer: Medicare Other | Admitting: Nurse Practitioner

## 2017-01-20 ENCOUNTER — Other Ambulatory Visit: Payer: Medicare Other

## 2017-01-20 ENCOUNTER — Ambulatory Visit: Payer: Medicare Other

## 2017-01-21 ENCOUNTER — Ambulatory Visit: Payer: Self-pay

## 2017-01-21 ENCOUNTER — Other Ambulatory Visit: Payer: Self-pay

## 2017-01-28 DIAGNOSIS — J019 Acute sinusitis, unspecified: Secondary | ICD-10-CM | POA: Diagnosis not present

## 2017-01-28 DIAGNOSIS — J209 Acute bronchitis, unspecified: Secondary | ICD-10-CM | POA: Diagnosis not present

## 2017-02-09 ENCOUNTER — Telehealth: Payer: Self-pay | Admitting: *Deleted

## 2017-02-09 DIAGNOSIS — E119 Type 2 diabetes mellitus without complications: Secondary | ICD-10-CM

## 2017-02-09 NOTE — Telephone Encounter (Signed)
Patient called regarding needing a referral to Eye Dr. Geraldine Solar to go to [facility] [phone]. Referral placed. Ok by Dr. Eulas Post.

## 2017-03-19 ENCOUNTER — Encounter: Payer: Self-pay | Admitting: Internal Medicine

## 2017-04-03 ENCOUNTER — Encounter: Payer: Medicare Other | Admitting: Internal Medicine

## 2017-04-08 ENCOUNTER — Encounter: Payer: Medicare Other | Admitting: Internal Medicine

## 2017-04-15 ENCOUNTER — Ambulatory Visit: Payer: Self-pay

## 2017-04-15 ENCOUNTER — Ambulatory Visit: Payer: Medicare Other

## 2017-04-16 ENCOUNTER — Telehealth: Payer: Self-pay

## 2017-04-16 NOTE — Telephone Encounter (Signed)
Called patient to try to schedule AWV in the office at 9:15 on 1/25 before she sees her PCP. No answer-left voicemail to call back.

## 2017-04-22 ENCOUNTER — Telehealth: Payer: Self-pay

## 2017-04-22 NOTE — Telephone Encounter (Signed)
FYI:  Patient called today to cancel appointment for 04/24/17 due to having severe depression. She stated that she has not left her home since 03/31/17. I urged pt to keep this appt but she stated that she has cancelled all of her upcoming appointments and will reschedule them when she feels better.

## 2017-04-24 ENCOUNTER — Encounter: Payer: Self-pay | Admitting: Internal Medicine

## 2017-04-29 ENCOUNTER — Telehealth: Payer: Self-pay | Admitting: Internal Medicine

## 2017-04-29 NOTE — Telephone Encounter (Signed)
I left a message asking the pt to call us to reschedule AWV and CPE. VDM (DD)

## 2017-05-01 DIAGNOSIS — Z01419 Encounter for gynecological examination (general) (routine) without abnormal findings: Secondary | ICD-10-CM | POA: Diagnosis not present

## 2017-05-01 DIAGNOSIS — Z1231 Encounter for screening mammogram for malignant neoplasm of breast: Secondary | ICD-10-CM | POA: Diagnosis not present

## 2017-05-01 LAB — HM MAMMOGRAPHY

## 2017-05-04 DIAGNOSIS — F3131 Bipolar disorder, current episode depressed, mild: Secondary | ICD-10-CM | POA: Diagnosis not present

## 2017-05-12 ENCOUNTER — Encounter: Payer: Self-pay | Admitting: Internal Medicine

## 2017-05-12 ENCOUNTER — Encounter: Payer: Self-pay | Admitting: Family

## 2017-05-12 DIAGNOSIS — N6002 Solitary cyst of left breast: Secondary | ICD-10-CM | POA: Diagnosis not present

## 2017-05-12 DIAGNOSIS — N6459 Other signs and symptoms in breast: Secondary | ICD-10-CM | POA: Diagnosis not present

## 2017-05-22 ENCOUNTER — Ambulatory Visit: Payer: Self-pay | Admitting: Internal Medicine

## 2017-05-25 DIAGNOSIS — H5213 Myopia, bilateral: Secondary | ICD-10-CM | POA: Diagnosis not present

## 2017-05-25 DIAGNOSIS — H2513 Age-related nuclear cataract, bilateral: Secondary | ICD-10-CM | POA: Diagnosis not present

## 2017-05-25 DIAGNOSIS — E119 Type 2 diabetes mellitus without complications: Secondary | ICD-10-CM | POA: Diagnosis not present

## 2017-05-25 LAB — HM DIABETES EYE EXAM

## 2017-05-26 ENCOUNTER — Encounter: Payer: Self-pay | Admitting: *Deleted

## 2017-05-28 ENCOUNTER — Encounter: Payer: Self-pay | Admitting: Nurse Practitioner

## 2017-05-28 ENCOUNTER — Ambulatory Visit (INDEPENDENT_AMBULATORY_CARE_PROVIDER_SITE_OTHER): Payer: Medicare HMO | Admitting: Nurse Practitioner

## 2017-05-28 VITALS — BP 140/80 | HR 86 | Temp 98.2°F | Ht 63.0 in | Wt 191.2 lb

## 2017-05-28 DIAGNOSIS — F319 Bipolar disorder, unspecified: Secondary | ICD-10-CM

## 2017-05-28 DIAGNOSIS — M542 Cervicalgia: Secondary | ICD-10-CM

## 2017-05-28 DIAGNOSIS — G4700 Insomnia, unspecified: Secondary | ICD-10-CM | POA: Diagnosis not present

## 2017-05-28 NOTE — Progress Notes (Signed)
Careteam: Patient Care Team: Gildardo Cranker, DO as PCP - General (Internal Medicine) Brien Few, MD as Consulting Physician (Obstetrics and Gynecology) Katy Apo, MD as Consulting Physician (Ophthalmology)  Advanced Directive information    Allergies  Allergen Reactions  . Oxycodone     Migraines  . Statins     "makes my urine turn brown"    Chief Complaint  Patient presents with  . Acute Visit    Patient c/o of not sleeping well and trouble falling asleep; taking prescribed medications to assist with little to no relief; onset 6weeks ago. + fall risk   . Medication Refill    No Refills  . Other    Depression concerns, under the care of psych. Patient would like to discuss      HPI: Patient is a 67 y.o. female seen in the office today due to insomnia. Pt with hx of bipolar, anxiety, insomnia ,PTSD and followed by psych Dr Casimiro Needle.  Currently taking wellbutrin SR BID, klonopin PRN, Neurontin, Lamictal, vyvanse and trazodone (recently increase to 100 mg)   Now currently on Abilify due to rapid cycling but her sister commented on this and she read the side effects and due to possible tardive dyskinesia is not taking it.  Currently going to bed at 2 am and sleeping until 11 am. Has also been staying up for 24 hours.  Talks about how she calls psychiatrist and he comments on her medication noncompliance and she gets frustrated. Talk to him last week and he suggestion was ability but she refuses to take and thought it would be best to come to PCP office.  Reports Neurontin has helped her daytime agitation. Taking 1 tablet in the morning and 2 at bedtime.   Last night she took 2 Neurontin, 1/2 Klonopin ,100 mg of trazodone, 100 mg of magnesium for sleep got in bed at midnight, feel asleep at 2 and was on and off all night. Finally got up at 10 am.  When she takes a lot of medication during the day she feels drugged.   Has not been taking vyvanse  Following with  neurosurgery due to neck pain and OA which is unchanged.   Review of Systems:  Review of Systems  Constitutional: Negative for fever and malaise/fatigue.  Musculoskeletal: Positive for neck pain (stiff and painful).  Neurological: Negative for weakness.  Psychiatric/Behavioral: Positive for depression. The patient is nervous/anxious and has insomnia.        Bipolar, manic    Past Medical History:  Diagnosis Date  . Anxiety   . Bipolar 1 disorder ([place])   . Bulging of cervical intervertebral disc    C2  . Bulging of thoracic intervertebral disc    t4  . Depression   . Diabetes mellitus without complication ([place])   . Hyperlipidemia   . PTSD (post-traumatic stress disorder)    Past Surgical History:  Procedure Laterality Date  . HEMICOLECTOMY  1997  . JOINT REPLACEMENT  2013   hip   Social History:   reports that  has never smoked. she has never used smokeless tobacco. She reports that she does not drink alcohol or use drugs.  Family History  Problem Relation Age of Onset  . Ovarian cancer Mother   . Lymphoma Father   . Colon cancer Sister     Medications: Patient's Medications  New Prescriptions   No medications on file  Previous Medications   ASCORBIC ACID (VITAMIN C) 1000 MG TABLET  Take 1,000 mg by mouth daily.   BLOOD GLUCOSE MONITORING SUPPL (ONE TOUCH ULTRA 2) W/DEVICE KIT    Use to test blood sugar once daily. Dx E11.9   BUPROPION (WELLBUTRIN SR) 150 MG 12 HR TABLET    Take 1 tablet (150 mg total) by mouth 2 (two) times daily.   CLONAZEPAM (KLONOPIN) 1 MG TABLET    Take 0.5-1 mg by mouth 2 (two) times daily as needed.    GABAPENTIN (NEURONTIN) 300 MG CAPSULE    Take 1 capsule (300 mg total) by mouth 3 (three) times daily.   GLUCOSE BLOOD TEST STRIP    One Touch Ultra test Strips Use to check blood up to three times daily. Dx E11.9   IBUPROFEN (ADVIL,MOTRIN) 600 MG TABLET    Take 1 tablet (600 mg total) by mouth every 6 (six) hours as needed.   LAMOTRIGINE  (LAMICTAL) 200 MG TABLET    Take 200 mg by mouth 2 (two) times daily.    LISDEXAMFETAMINE (VYVANSE) 40 MG CAPSULE    Take 40 mg by mouth every morning.   LORATADINE (CLARITIN) 10 MG TABLET    Take 1 tablet (10 mg total) by mouth daily.   METFORMIN (GLUCOPHAGE) 500 MG TABLET    Take 1 tablet (500 mg total) by mouth 2 (two) times daily with a meal.   ONE TOUCH LANCETS MISC    Use to test blood sugars twice daily DX: E 11.9   PROBIOTIC PRODUCT (SOLUBLE FIBER/PROBIOTICS) CHEW    Chew 1 tablet by mouth daily.   PROLINE PO    Take by mouth daily.   TRAZODONE (DESYREL) 50 MG TABLET    Take 25 mg by mouth at bedtime as needed.   Modified Medications   No medications on file  Discontinued Medications   No medications on file     Physical Exam:  Vitals:   05/28/17 1058  BP: 140/80  Pulse: 86  Temp: 98.2 F (36.8 C)  TempSrc: Oral  SpO2: 95%  Weight: 191 lb 3.2 oz (86.7 kg)  Height: '5\' 3"'  (1.6 m)   Body mass index is 33.87 kg/m.  Physical Exam  Constitutional: She appears well-developed and well-nourished.  HENT:  Head: Normocephalic and atraumatic.  Eyes: Conjunctivae and EOM are normal. Pupils are equal, round, and reactive to light.  Neck: Neck supple.  Cardiovascular: Normal rate and regular rhythm.  Pulmonary/Chest: Effort normal and breath sounds normal.  Skin: Skin is warm and dry.  Psychiatric: Her mood appears anxious. Her speech is rapid and/or pressured. She is hyperactive.    Labs reviewed: Basic Metabolic Panel: Recent Labs    09/03/16 1050  NA 138  K 4.3  CL 105  CO2 25  GLUCOSE 190*  BUN 14  CREATININE 0.88  CALCIUM 8.9   Liver Function Tests: Recent Labs    09/25/16 0853  AST 17  ALT 16  ALKPHOS 78  BILITOT 0.4  PROT 6.2  ALBUMIN 4.3   No results for input(s): LIPASE, AMYLASE in the last 8760 hours. No results for input(s): AMMONIA in the last 8760 hours. CBC: Recent Labs    09/03/16 1050  WBC 5.6  HGB 12.2  HCT 37.4  MCV 88.8  PLT  199   Lipid Panel: Recent Labs    09/25/16 0853  CHOL 214*  HDL 53  LDLCALC 136*  TRIG 125  CHOLHDL 4.0   TSH: No results for input(s): TSH in the last 8760 hours. A1C: Lab Results  Component Value  Date   HGBA1C 5.7 (H) 09/25/2016     Assessment/Plan 1. Insomnia, unspecified type Appears to be psychiatric in origin. Recently recommended to start Abilify for bipolar but she refuses to do so due to side effect, appears to be manic at this time. Recommended to follow up with psychiatrist asap for this. Also did give her information on proper sleep hygiene. She reports sleep was better with reduced dose of trazodone so recommended cutting back to 50 mg daily.  Pt reports she is not using vyvanse, possible adjustments/decreasing or changing wellbutrin may be beneficial however reports she has been on Wellbutrin for many years and this has not been an issue in the past.    2. Neck pain Chronic and stable, following with neurosurgery.   3. Bipolar disorder  -see number 1  Denica Web K. Harle Battiest  [facility] [phone] 8 am - 5 pm) [phone] (after hours)  CC: Dr Casimiro Needle.

## 2017-05-28 NOTE — Patient Instructions (Signed)
To keep a sleep routine No caffeine  Regular activity during the day No naps during the day Only be in bed sleep To turn off TV Can try LOW Dose melatonin 3 mg by mouth daily at bedtime.  To reduce trazodone to 50 mg at bedtime Take medication closer to bedtime and then start bedtime routine  To talk to psychiatrist about changing medication   Insomnia Insomnia is a sleep disorder that makes it difficult to fall asleep or to stay asleep. Insomnia can cause tiredness (fatigue), low energy, difficulty concentrating, mood swings, and poor performance at work or school. There are three different ways to classify insomnia:  Difficulty falling asleep.  Difficulty staying asleep.  Waking up too early in the morning.  Any type of insomnia can be long-term (chronic) or short-term (acute). Both are common. Short-term insomnia usually lasts for three months or less. Chronic insomnia occurs at least three times a week for longer than three months. What are the causes? Insomnia may be caused by another condition, situation, or substance, such as:  Anxiety.  Certain medicines.  Gastroesophageal reflux disease (GERD) or other gastrointestinal conditions.  Asthma or other breathing conditions.  Restless legs syndrome, sleep apnea, or other sleep disorders.  Chronic pain.  Menopause. This may include hot flashes.  Stroke.  Abuse of alcohol, tobacco, or illegal drugs.  Depression.  Caffeine.  Neurological disorders, such as Alzheimer disease.  An overactive thyroid (hyperthyroidism).  The cause of insomnia may not be known. What increases the risk? Risk factors for insomnia include:  Gender. Women are more commonly affected than men.  Age. Insomnia is more common as you get older.  Stress. This may involve your professional or personal life.  Income. Insomnia is more common in people with lower income.  Lack of exercise.  Irregular work schedule or night  shifts.  Traveling between different time zones.  What are the signs or symptoms? If you have insomnia, trouble falling asleep or trouble staying asleep is the main symptom. This may lead to other symptoms, such as:  Feeling fatigued.  Feeling nervous about going to sleep.  Not feeling rested in the morning.  Having trouble concentrating.  Feeling irritable, anxious, or depressed.  How is this treated? Treatment for insomnia depends on the cause. If your insomnia is caused by an underlying condition, treatment will focus on addressing the condition. Treatment may also include:  Medicines to help you sleep.  Counseling or therapy.  Lifestyle adjustments.  Follow these instructions at home:  Take medicines only as directed by your health care provider.  Keep regular sleeping and waking hours. Avoid naps.  Keep a sleep diary to help you and your health care provider figure out what could be causing your insomnia. Include: ? When you sleep. ? When you wake up during the night. ? How well you sleep. ? How rested you feel the next day. ? Any side effects of medicines you are taking. ? What you eat and drink.  Make your bedroom a comfortable place where it is easy to fall asleep: ? Put up shades or special blackout curtains to block light from outside. ? Use a white noise machine to block noise. ? Keep the temperature cool.  Exercise regularly as directed by your health care provider. Avoid exercising right before bedtime.  Use relaxation techniques to manage stress. Ask your health care provider to suggest some techniques that may work well for you. These may include: ? Breathing exercises. ? Routines to  release muscle tension. ? Visualizing peaceful scenes.  Cut back on alcohol, caffeinated beverages, and cigarettes, especially close to bedtime. These can disrupt your sleep.  Do not overeat or eat spicy foods right before bedtime. This can lead to digestive discomfort  that can make it hard for you to sleep.  Limit screen use before bedtime. This includes: ? Watching TV. ? Using your smartphone, tablet, and computer.  Stick to a routine. This can help you fall asleep faster. Try to do a quiet activity, brush your teeth, and go to bed at the same time each night.  Get out of bed if you are still awake after 15 minutes of trying to sleep. Keep the lights down, but try reading or doing a quiet activity. When you feel sleepy, go back to bed.  Make sure that you drive carefully. Avoid driving if you feel very sleepy.  Keep all follow-up appointments as directed by your health care provider. This is important. Contact a health care provider if:  You are tired throughout the day or have trouble in your daily routine due to sleepiness.  You continue to have sleep problems or your sleep problems get worse. Get help right away if:  You have serious thoughts about hurting yourself or someone else. This information is not intended to replace advice given to you by your health care provider. Make sure you discuss any questions you have with your health care provider. Document Released: 03/14/2000 Document Revised: 08/17/2015 Document Reviewed: 12/16/2013 Elsevier Interactive Patient Education  [employer].

## 2017-06-02 ENCOUNTER — Ambulatory Visit: Payer: Self-pay | Admitting: Internal Medicine

## 2017-06-08 ENCOUNTER — Telehealth: Payer: Self-pay | Admitting: Internal Medicine

## 2017-06-08 NOTE — Telephone Encounter (Signed)
Spoke with patient, patient states she has other engagements on on 06/22/17 and this day and time does not work for her.  Please call to discuss other options

## 2017-06-08 NOTE — Telephone Encounter (Signed)
The patient needed to reschedule her 06/19/17 AWV.  I left a message asking if she can come at 12:15 on 06/22/17, not 12:00. VDM (DD)

## 2017-06-17 ENCOUNTER — Other Ambulatory Visit: Payer: Medicare Other

## 2017-06-19 ENCOUNTER — Encounter: Payer: Medicare Other | Admitting: Internal Medicine

## 2017-06-19 ENCOUNTER — Ambulatory Visit: Payer: Medicare Other

## 2017-06-22 ENCOUNTER — Encounter: Payer: Self-pay | Admitting: Nurse Practitioner

## 2017-06-22 ENCOUNTER — Ambulatory Visit (INDEPENDENT_AMBULATORY_CARE_PROVIDER_SITE_OTHER): Payer: Medicare HMO | Admitting: Nurse Practitioner

## 2017-06-22 ENCOUNTER — Ambulatory Visit: Payer: Medicare HMO

## 2017-06-22 VITALS — BP 126/76 | HR 97 | Temp 98.7°F | Ht 63.0 in | Wt 192.0 lb

## 2017-06-22 DIAGNOSIS — E119 Type 2 diabetes mellitus without complications: Secondary | ICD-10-CM | POA: Diagnosis not present

## 2017-06-22 MED ORDER — ONETOUCH ULTRA 2 W/DEVICE KIT: PACK | 0 refills | Status: DC

## 2017-06-22 MED ORDER — ONETOUCH LANCETS MISC: 12 refills | Status: DC

## 2017-06-22 MED ORDER — LORATADINE 10 MG PO TABS: 10.0000 mg | ORAL_TABLET | Freq: Every day | ORAL | 1 refills | Status: DC

## 2017-06-22 MED ORDER — GLUCOSE BLOOD VI STRP: ORAL_STRIP | 11 refills | Status: DC

## 2017-06-22 NOTE — Progress Notes (Signed)
Careteam: Patient Care Team: Gildardo Cranker, DO as PCP - General (Internal Medicine) Brien Few, MD as Consulting Physician (Obstetrics and Gynecology) Katy Apo, MD as Consulting Physician (Ophthalmology)  Advanced Directive information    Allergies  Allergen Reactions  . Oxycodone     Migraines  . Statins     "makes my urine turn brown"    Chief Complaint  Patient presents with  . Acute Visit    Pt is being seen due to fluctuating blood sugars for several weeks. Pt has been unable to check blood sugars in the last week due to misplacing her meter.      HPI: Patient is a 67 y.o. female seen in the office today due to fluctuating blood sugars.  Report she lost her meter last week so she does not know what it has been recently.  Previously blood sugar was 150-190 after meals but when she started taking it more recently it was 200-250 after meals.  Fasting blood 200-250.  No low blood sugars.  Last A1c was 5.7 in June 2018 Currently taking metformin 500 mg by mouth twice daily with meal- will sometimes forget with evening meal an take at bedtime.  No changes in vision/blurry vision No increase in urination.  No constipation.  No neuropathy.  Diet was poor but she has improved in the last few weeks.    Review of Systems:  Review of Systems  Constitutional: Negative for chills, diaphoresis, fever and malaise/fatigue.  Respiratory: Negative for shortness of breath.   Cardiovascular: Negative for chest pain.  Gastrointestinal:       Will occasionally have diarrhea or constipation, no changes in BM  Genitourinary: Negative for dysuria, frequency and urgency.  Neurological: Negative for tingling and sensory change.  Psychiatric/Behavioral: Positive for depression. The patient is nervous/anxious and has insomnia.     Past Medical History:  Diagnosis Date  . Anxiety   . Bipolar 1 disorder ([place])   . Bulging of cervical intervertebral disc    C2  . Bulging  of thoracic intervertebral disc    t4  . Depression   . Diabetes mellitus without complication ([place])   . Hyperlipidemia   . PTSD (post-traumatic stress disorder)    Past Surgical History:  Procedure Laterality Date  . HEMICOLECTOMY  1997  . JOINT REPLACEMENT  2013   hip   Social History:   reports that she has never smoked. She has never used smokeless tobacco. She reports that she does not drink alcohol or use drugs.  Family History  Problem Relation Age of Onset  . Ovarian cancer Mother   . Lymphoma Father   . Colon cancer Sister     Medications: Patient's Medications  New Prescriptions   No medications on file  Previous Medications   ASCORBIC ACID (VITAMIN C) 1000 MG TABLET    Take 1,000 mg by mouth daily.   BLOOD GLUCOSE MONITORING SUPPL (ONE TOUCH ULTRA 2) W/DEVICE KIT    Use to test blood sugar once daily. Dx E11.9   BUPROPION (WELLBUTRIN SR) 150 MG 12 HR TABLET    Take 1 tablet (150 mg total) by mouth 2 (two) times daily.   CLONAZEPAM (KLONOPIN) 1 MG TABLET    Take 0.5-1 mg by mouth 2 (two) times daily as needed.    GABAPENTIN (NEURONTIN) 300 MG CAPSULE    Take 1 capsule (300 mg total) by mouth 3 (three) times daily.   GLUCOSE BLOOD TEST STRIP    One Touch Ultra  test Strips Use to check blood up to three times daily. Dx E11.9   IBUPROFEN (ADVIL,MOTRIN) 600 MG TABLET    Take 1 tablet (600 mg total) by mouth every 6 (six) hours as needed.   LAMOTRIGINE (LAMICTAL) 200 MG TABLET    Take 200 mg by mouth 2 (two) times daily.    LISDEXAMFETAMINE (VYVANSE) 40 MG CAPSULE    Take 40 mg by mouth every morning.   LORATADINE (CLARITIN) 10 MG TABLET    Take 1 tablet (10 mg total) by mouth daily.   METFORMIN (GLUCOPHAGE) 500 MG TABLET    Take 1 tablet (500 mg total) by mouth 2 (two) times daily with a meal.   ONE TOUCH LANCETS MISC    Use to test blood sugars twice daily DX: E 11.9   PROBIOTIC PRODUCT (SOLUBLE FIBER/PROBIOTICS) CHEW    Chew 1 tablet by mouth daily.   PROLINE PO     Take by mouth daily.   TRAZODONE (DESYREL) 50 MG TABLET    Take 25 mg by mouth at bedtime as needed.   Modified Medications   No medications on file  Discontinued Medications   No medications on file     Physical Exam:  Vitals:   06/22/17 1313  BP: 126/76  Pulse: 97  Temp: 98.7 F (37.1 C)  TempSrc: Oral  SpO2: 99%  Weight: 192 lb (87.1 kg)  Height: '5\' 3"'  (1.6 m)   Body mass index is 34.01 kg/m.  Physical Exam  Constitutional: She appears well-developed and well-nourished.  HENT:  Head: Normocephalic and atraumatic.  Eyes: Pupils are equal, round, and reactive to light. Conjunctivae and EOM are normal.  Neck: Neck supple.  Cardiovascular: Normal rate and regular rhythm.  Pulmonary/Chest: Effort normal and breath sounds normal.  Skin: Skin is warm and dry.    Labs reviewed: Basic Metabolic Panel: Recent Labs    09/03/16 1050  NA 138  K 4.3  CL 105  CO2 25  GLUCOSE 190*  BUN 14  CREATININE 0.88  CALCIUM 8.9   Liver Function Tests: Recent Labs    09/25/16 0853  AST 17  ALT 16  ALKPHOS 78  BILITOT 0.4  PROT 6.2  ALBUMIN 4.3   No results for input(s): LIPASE, AMYLASE in the last 8760 hours. No results for input(s): AMMONIA in the last 8760 hours. CBC: Recent Labs    09/03/16 1050  WBC 5.6  HGB 12.2  HCT 37.4  MCV 88.8  PLT 199   Lipid Panel: Recent Labs    09/25/16 0853  CHOL 214*  HDL 53  LDLCALC 136*  TRIG 125  CHOLHDL 4.0   TSH: No results for input(s): TSH in the last 8760 hours. A1C: Lab Results  Component Value Date   HGBA1C 5.7 (H) 09/25/2016     Assessment/Plan 1. Diabetes mellitus with no complication (HCC) -encouraged to continue lifestyle changes, has also increase activity as well as eating better -to take blood sugar at least twice daily, record and bring to follow up in 2 weeks - Blood Glucose Monitoring Suppl (ONE TOUCH ULTRA 2) w/Device KIT; Use to test blood sugar once daily. Dx E11.9  Dispense: 1 each;  Refill: 0 - glucose blood test strip; One Touch Ultra test Strips Use to check blood up to three times daily. Dx E11.9  Dispense: 100 each; Refill: 11 - ONE TOUCH LANCETS MISC; Use to test blood sugars twice daily DX: E 11.9  Dispense: 100 each; Refill: 12 - Hemoglobin A1c -  COMPLETE METABOLIC PANEL WITH GFR  Next appt: 2 weeks with Tivis Ringer for diabetic education and follow up [employer]. [place], [facility] [phone]

## 2017-06-22 NOTE — Patient Instructions (Addendum)
To check blood sugar twice daily- fasting (before breakfast) and before dinner Record these blood sugars- make sure you write down when you take them. Continue to work on diet.   To follow up with Tivis Ringer in 2 weeks   Diabetes Mellitus and Nutrition When you have diabetes (diabetes mellitus), it is very important to have healthy eating habits because your blood sugar (glucose) levels are greatly affected by what you eat and drink. Eating healthy foods in the appropriate amounts, at about the same times every day, can help you:  Control your blood glucose.  Lower your risk of heart disease.  Improve your blood pressure.  Reach or maintain a healthy weight.  Every person with diabetes is different, and each person has different needs for a meal plan. Your health care provider may recommend that you work with a diet and nutrition specialist (dietitian) to make a meal plan that is best for you. Your meal plan may vary depending on factors such as:  The calories you need.  The medicines you take.  Your weight.  Your blood glucose, blood pressure, and cholesterol levels.  Your activity level.  Other health conditions you have, such as heart or kidney disease.  How do carbohydrates affect me? Carbohydrates affect your blood glucose level more than any other type of food. Eating carbohydrates naturally increases the amount of glucose in your blood. Carbohydrate counting is a method for keeping track of how many carbohydrates you eat. Counting carbohydrates is important to keep your blood glucose at a healthy level, especially if you use insulin or take certain oral diabetes medicines. It is important to know how many carbohydrates you can safely have in each meal. This is different for every person. Your dietitian can help you calculate how many carbohydrates you should have at each meal and for snack. Foods that contain carbohydrates include:  Bread, cereal, rice, pasta, and  crackers.  Potatoes and corn.  Peas, beans, and lentils.  Milk and yogurt.  Fruit and juice.  Desserts, such as cakes, cookies, ice cream, and candy.  How does alcohol affect me? Alcohol can cause a sudden decrease in blood glucose (hypoglycemia), especially if you use insulin or take certain oral diabetes medicines. Hypoglycemia can be a life-threatening condition. Symptoms of hypoglycemia (sleepiness, dizziness, and confusion) are similar to symptoms of having too much alcohol. If your health care provider says that alcohol is safe for you, follow these guidelines:  Limit alcohol intake to no more than 1 drink per day for nonpregnant women and 2 drinks per day for men. One drink equals 12 oz of beer, 5 oz of wine, or 1 oz of hard liquor.  Do not drink on an empty stomach.  Keep yourself hydrated with water, diet soda, or unsweetened iced tea.  Keep in mind that regular soda, juice, and other mixers may contain a lot of sugar and must be counted as carbohydrates.  What are tips for following this plan? Reading food labels  Start by checking the serving size on the label. The amount of calories, carbohydrates, fats, and other nutrients listed on the label are based on one serving of the food. Many foods contain more than one serving per package.  Check the total grams (g) of carbohydrates in one serving. You can calculate the number of servings of carbohydrates in one serving by dividing the total carbohydrates by 15. For example, if a food has 30 g of total carbohydrates, it would be equal to 2  servings of carbohydrates.  Check the number of grams (g) of saturated and trans fats in one serving. Choose foods that have low or no amount of these fats.  Check the number of milligrams (mg) of sodium in one serving. Most people should limit total sodium intake to less than 2,300 mg per day.  Always check the nutrition information of foods labeled as "low-fat" or "nonfat". These foods  may be higher in added sugar or refined carbohydrates and should be avoided.  Talk to your dietitian to identify your daily goals for nutrients listed on the label. Shopping  Avoid buying canned, premade, or processed foods. These foods tend to be high in fat, sodium, and added sugar.  Shop around the outside edge of the grocery store. This includes fresh fruits and vegetables, bulk grains, fresh meats, and fresh dairy. Cooking  Use low-heat cooking methods, such as baking, instead of high-heat cooking methods like deep frying.  Cook using healthy oils, such as olive, canola, or sunflower oil.  Avoid cooking with butter, cream, or high-fat meats. Meal planning  Eat meals and snacks regularly, preferably at the same times every day. Avoid going long periods of time without eating.  Eat foods high in fiber, such as fresh fruits, vegetables, beans, and whole grains. Talk to your dietitian about how many servings of carbohydrates you can eat at each meal.  Eat 4-6 ounces of lean protein each day, such as lean meat, chicken, fish, eggs, or tofu. 1 ounce is equal to 1 ounce of meat, chicken, or fish, 1 egg, or 1/4 cup of tofu.  Eat some foods each day that contain healthy fats, such as avocado, nuts, seeds, and fish. Lifestyle   Check your blood glucose regularly.  Exercise at least 30 minutes 5 or more days each week, or as told by your health care provider.  Take medicines as told by your health care provider.  Do not use any products that contain nicotine or tobacco, such as cigarettes and e-cigarettes. If you need help quitting, ask your health care provider.  Work with a Social worker or diabetes educator to identify strategies to manage stress and any emotional and social challenges. What are some questions to ask my health care provider?  Do I need to meet with a diabetes educator?  Do I need to meet with a dietitian?  What number can I call if I have questions?  When are the  best times to check my blood glucose? Where to find more information:  American Diabetes Association: diabetes.org/food-and-fitness/food  Academy of Nutrition and Dietetics: PokerClues.dk  [employer] of Diabetes and Digestive and Kidney Diseases (NIH): ContactWire.be Summary  A healthy meal plan will help you control your blood glucose and maintain a healthy lifestyle.  Working with a diet and nutrition specialist (dietitian) can help you make a meal plan that is best for you.  Keep in mind that carbohydrates and alcohol have immediate effects on your blood glucose levels. It is important to count carbohydrates and to use alcohol carefully. This information is not intended to replace advice given to you by your health care provider. Make sure you discuss any questions you have with your health care provider. Document Released: 12/12/2004 Document Revised: 04/21/2016 Document Reviewed: 04/21/2016 Elsevier Interactive Patient Education  [employer].

## 2017-06-22 NOTE — Addendum Note (Signed)
Addended by: Denyse Amass on: 06/22/2017 02:58 PM   Modules accepted: Orders

## 2017-06-23 LAB — COMPLETE METABOLIC PANEL WITH GFR
AG RATIO: 2 (calc) (ref 1.0–2.5)
ALT: 13 U/L (ref 6–29)
AST: 14 U/L (ref 10–35)
Albumin: 4.3 g/dL (ref 3.6–5.1)
Alkaline phosphatase (APISO): 78 U/L (ref 33–130)
BUN: 25 mg/dL (ref 7–25)
CALCIUM: 9.2 mg/dL (ref 8.6–10.4)
CHLORIDE: 106 mmol/L (ref 98–110)
CO2: 26 mmol/L (ref 20–32)
CREATININE: 0.98 mg/dL (ref 0.50–0.99)
GFR, EST AFRICAN AMERICAN: 69 mL/min/{1.73_m2} (ref >=60)
GFR, EST NON AFRICAN AMERICAN: 59 mL/min/{1.73_m2} — AB (ref >=60)
GLOBULIN: 2.1 g/dL (ref 1.9–3.7)
Glucose, Bld: 208 mg/dL — ABNORMAL HIGH (ref 65–139)
POTASSIUM: 4.5 mmol/L (ref 3.5–5.3)
SODIUM: 140 mmol/L (ref 135–146)
TOTAL PROTEIN: 6.4 g/dL (ref 6.1–8.1)
Total Bilirubin: 0.3 mg/dL (ref 0.2–1.2)

## 2017-06-23 LAB — HEMOGLOBIN A1C
HEMOGLOBIN A1C: 6.2 %{Hb} — AB (ref <5.7)
Mean Plasma Glucose: 131 (calc)
eAG (mmol/L): 7.3 (calc)

## 2017-06-24 ENCOUNTER — Other Ambulatory Visit: Payer: Self-pay

## 2017-06-25 DIAGNOSIS — Z780 Asymptomatic menopausal state: Secondary | ICD-10-CM | POA: Diagnosis not present

## 2017-06-25 DIAGNOSIS — M8589 Other specified disorders of bone density and structure, multiple sites: Secondary | ICD-10-CM | POA: Diagnosis not present

## 2017-06-25 LAB — HM DEXA SCAN

## 2017-06-26 ENCOUNTER — Telehealth: Payer: Self-pay | Admitting: *Deleted

## 2017-06-26 DIAGNOSIS — E119 Type 2 diabetes mellitus without complications: Secondary | ICD-10-CM

## 2017-06-26 MED ORDER — ACCU-CHEK AVIVA VI SOLN: 3 refills | Status: DC

## 2017-06-26 NOTE — Telephone Encounter (Signed)
Patient called and stated that she received a new glucose meter, AccuChek and needs the lancets and control solution sent to [facility].

## 2017-07-03 DIAGNOSIS — F3131 Bipolar disorder, current episode depressed, mild: Secondary | ICD-10-CM | POA: Diagnosis not present

## 2017-07-06 ENCOUNTER — Ambulatory Visit: Payer: Medicare HMO | Admitting: Pharmacotherapy

## 2017-07-07 DIAGNOSIS — F3131 Bipolar disorder, current episode depressed, mild: Secondary | ICD-10-CM | POA: Diagnosis not present

## 2017-08-04 DIAGNOSIS — H25041 Posterior subcapsular polar age-related cataract, right eye: Secondary | ICD-10-CM | POA: Diagnosis not present

## 2017-08-04 DIAGNOSIS — H2511 Age-related nuclear cataract, right eye: Secondary | ICD-10-CM | POA: Diagnosis not present

## 2017-08-04 DIAGNOSIS — H25811 Combined forms of age-related cataract, right eye: Secondary | ICD-10-CM | POA: Diagnosis not present

## 2017-08-10 ENCOUNTER — Other Ambulatory Visit: Payer: Self-pay

## 2017-08-10 ENCOUNTER — Ambulatory Visit: Payer: Medicare HMO

## 2017-08-10 DIAGNOSIS — E782 Mixed hyperlipidemia: Secondary | ICD-10-CM

## 2017-08-10 DIAGNOSIS — Z79899 Other long term (current) drug therapy: Secondary | ICD-10-CM

## 2017-08-10 DIAGNOSIS — E119 Type 2 diabetes mellitus without complications: Secondary | ICD-10-CM

## 2017-08-11 ENCOUNTER — Other Ambulatory Visit: Payer: Self-pay

## 2017-08-11 HISTORY — PX: CATARACT EXTRACTION: SUR2

## 2017-08-14 ENCOUNTER — Encounter: Payer: Self-pay | Admitting: Internal Medicine

## 2017-08-18 DIAGNOSIS — F3131 Bipolar disorder, current episode depressed, mild: Secondary | ICD-10-CM | POA: Diagnosis not present

## 2017-09-01 DIAGNOSIS — H25042 Posterior subcapsular polar age-related cataract, left eye: Secondary | ICD-10-CM | POA: Diagnosis not present

## 2017-09-01 DIAGNOSIS — H2512 Age-related nuclear cataract, left eye: Secondary | ICD-10-CM | POA: Diagnosis not present

## 2017-09-01 DIAGNOSIS — H25812 Combined forms of age-related cataract, left eye: Secondary | ICD-10-CM | POA: Diagnosis not present

## 2017-09-01 DIAGNOSIS — R6889 Other general symptoms and signs: Secondary | ICD-10-CM | POA: Diagnosis not present

## 2017-09-01 HISTORY — PX: CATARACT EXTRACTION: SUR2

## 2017-09-09 DIAGNOSIS — F3131 Bipolar disorder, current episode depressed, mild: Secondary | ICD-10-CM | POA: Diagnosis not present

## 2017-09-14 ENCOUNTER — Telehealth: Payer: Self-pay | Admitting: Internal Medicine

## 2017-09-14 DIAGNOSIS — F3131 Bipolar disorder, current episode depressed, mild: Secondary | ICD-10-CM | POA: Diagnosis not present

## 2017-09-14 NOTE — Telephone Encounter (Signed)
I called the pt to schedule AWV w/ Clarise Cruz on 09/16/17 and move labs to that day also.  However, there was no answer and the voicemail hasn't been set up. VDM (DD)

## 2017-09-15 ENCOUNTER — Other Ambulatory Visit: Payer: Medicare HMO

## 2017-09-15 DIAGNOSIS — E782 Mixed hyperlipidemia: Secondary | ICD-10-CM | POA: Diagnosis not present

## 2017-09-15 DIAGNOSIS — Z79899 Other long term (current) drug therapy: Secondary | ICD-10-CM | POA: Diagnosis not present

## 2017-09-15 DIAGNOSIS — E119 Type 2 diabetes mellitus without complications: Secondary | ICD-10-CM | POA: Diagnosis not present

## 2017-09-16 LAB — COMPLETE METABOLIC PANEL WITH GFR
AG Ratio: 2 (calc) (ref 1.0–2.5)
ALT: 17 U/L (ref 6–29)
AST: 15 U/L (ref 10–35)
Albumin: 4.5 g/dL (ref 3.6–5.1)
Alkaline phosphatase (APISO): 89 U/L (ref 33–130)
BUN/Creatinine Ratio: 23 (calc) — ABNORMAL HIGH (ref 6–22)
BUN: 24 mg/dL (ref 7–25)
CALCIUM: 9.9 mg/dL (ref 8.6–10.4)
CHLORIDE: 103 mmol/L (ref 98–110)
CO2: 26 mmol/L (ref 20–32)
Creat: 1.04 mg/dL — ABNORMAL HIGH (ref 0.50–0.99)
GFR, EST NON AFRICAN AMERICAN: 55 mL/min/{1.73_m2} — AB (ref >=60)
GFR, Est African American: 64 mL/min/{1.73_m2} (ref >=60)
GLUCOSE: 170 mg/dL — AB (ref 65–99)
Globulin: 2.2 g/dL (calc) (ref 1.9–3.7)
Potassium: 4.6 mmol/L (ref 3.5–5.3)
Sodium: 140 mmol/L (ref 135–146)
Total Bilirubin: 0.4 mg/dL (ref 0.2–1.2)
Total Protein: 6.7 g/dL (ref 6.1–8.1)

## 2017-09-16 LAB — HEMOGLOBIN A1C
EAG (MMOL/L): 7.3 (calc)
HEMOGLOBIN A1C: 6.2 %{Hb} — AB (ref <5.7)
Mean Plasma Glucose: 131 (calc)

## 2017-09-16 LAB — LIPID PANEL
Cholesterol: 253 mg/dL — ABNORMAL HIGH (ref <200)
HDL: 48 mg/dL — ABNORMAL LOW (ref >50)
LDL CHOLESTEROL (CALC): 168 mg/dL — AB
NON-HDL CHOLESTEROL (CALC): 205 mg/dL — AB (ref <130)
TRIGLYCERIDES: 209 mg/dL — AB (ref <150)
Total CHOL/HDL Ratio: 5.3 (calc) — ABNORMAL HIGH (ref <5.0)

## 2017-09-16 LAB — MICROALBUMIN / CREATININE URINE RATIO
Creatinine, Urine: 138 mg/dL (ref 20–275)
Microalb Creat Ratio: 14 mcg/mg creat (ref <30)
Microalb, Ur: 1.9 mg/dL

## 2017-09-16 LAB — TSH: TSH: 3.34 mIU/L (ref 0.40–4.50)

## 2017-09-17 ENCOUNTER — Ambulatory Visit: Payer: Medicare HMO | Admitting: Nurse Practitioner

## 2017-09-18 ENCOUNTER — Ambulatory Visit: Payer: Medicare HMO | Admitting: Nurse Practitioner

## 2017-09-30 ENCOUNTER — Ambulatory Visit (INDEPENDENT_AMBULATORY_CARE_PROVIDER_SITE_OTHER): Payer: Medicare HMO

## 2017-09-30 ENCOUNTER — Ambulatory Visit (INDEPENDENT_AMBULATORY_CARE_PROVIDER_SITE_OTHER): Payer: Medicare HMO | Admitting: Internal Medicine

## 2017-09-30 ENCOUNTER — Encounter: Payer: Self-pay | Admitting: Internal Medicine

## 2017-09-30 VITALS — BP 158/68 | HR 115 | Temp 98.0°F | Ht 63.0 in | Wt 196.0 lb

## 2017-09-30 VITALS — BP 158/68 | HR 108 | Temp 98.0°F | Ht 63.0 in | Wt 196.0 lb

## 2017-09-30 DIAGNOSIS — E1169 Type 2 diabetes mellitus with other specified complication: Secondary | ICD-10-CM | POA: Diagnosis not present

## 2017-09-30 DIAGNOSIS — Z23 Encounter for immunization: Secondary | ICD-10-CM | POA: Diagnosis not present

## 2017-09-30 DIAGNOSIS — M8580 Other specified disorders of bone density and structure, unspecified site: Secondary | ICD-10-CM

## 2017-09-30 DIAGNOSIS — Z79899 Other long term (current) drug therapy: Secondary | ICD-10-CM

## 2017-09-30 DIAGNOSIS — G894 Chronic pain syndrome: Secondary | ICD-10-CM

## 2017-09-30 DIAGNOSIS — F319 Bipolar disorder, unspecified: Secondary | ICD-10-CM | POA: Diagnosis not present

## 2017-09-30 DIAGNOSIS — J302 Other seasonal allergic rhinitis: Secondary | ICD-10-CM | POA: Diagnosis not present

## 2017-09-30 DIAGNOSIS — E785 Hyperlipidemia, unspecified: Secondary | ICD-10-CM | POA: Diagnosis not present

## 2017-09-30 DIAGNOSIS — Z0000 Encounter for general adult medical examination without abnormal findings: Secondary | ICD-10-CM | POA: Diagnosis not present

## 2017-09-30 MED ORDER — LORATADINE 10 MG PO TABS: 10.0000 mg | ORAL_TABLET | Freq: Every day | ORAL | 3 refills | Status: DC

## 2017-09-30 NOTE — Progress Notes (Signed)
Patient ID: Kaitlyn Wood, female   DOB: [birth year], 67 y.o.   MRN: [id number]   Location:  [facility] OFFICE  Provider: DR Arletha Grippe  Code Status:  Goals of Care:  Advanced Directives 09/30/2017  Does Patient Have a Medical Advance Directive? No  Would patient like information on creating a medical advance directive? Yes (MAU/Ambulatory/Procedural Areas - Information given)     Chief Complaint  Patient presents with  . Medical Management of Chronic Issues    9 month follow-up (patient will need to schedule CPX in near future). AWV completed today, + Fall Risk, + Depression Screening, MMSE 27/30, did not pass clock drawing.    . Medication Refill    Claritin   . Medication Management    Discuss Zetia, recommened on last lab check    HPI: Patient is a 67 y.o. female seen today for medical management of chronic diseases.  AWV completed today. MMSE 27/30 but failed clock drawing test.  She is a poor historian due to psych d/o. Hx obtained from chart. No pain in neck. Her nose has bloody mucous that is thick at times. She takes claritin daily for seasonal allergy.  Chronic pain - affects neck and back. She has occasional numbness/tingling. Currently controlled  Urinary incontinence - urgency is stable. She was told by GYN that she has prolapsed uterus.  Bipolar d/o/anxiety/insomnia/PTSD - MDD uncontrolled. she is followed by psych Dr Casimiro Needle. She is taking wellbutrin sr, klonopin, neurontin, lamictal, vyvanse and trazodone. She sees a therapist. She recently had a bout of mania and now back to severe MDD.   Hyperlipidemia - she reports intolerance to statins as they caused "my urine to turn brown". She is hesitant to try any other medications,especially if they have been out "<10 yrs". She states LDL was 180 in the past.  Current LDL 168; Tchol 253; HDL 48; TG 209  DM - she takes metformin daily. BS are normal range and no low BS reactions. A1c 6.8%. She has not had any numbness/tingling.  She saw eye Dr in May 2018 and was told no diabetic changes. Urine microalbumin/creatinine ratio 14. Cr 1.04  Cervical/lumbar herniated/bulging discs - C2-T4; she reports no significant pain at this time. She has never had sx. Initial injury was falling off porch with whiplash injury many years ago. She had an episode when her leg gave away when she went to get out of her car. Gabapentin helps pain.   FHx cancer - Mother with ovarian cancer; sister/MGM with colorectal cancer; Father with Lymphoma  Hep C nonreactive   Past Medical History:  Diagnosis Date  . Anxiety   . Bipolar 1 disorder ([place])   . Bulging of cervical intervertebral disc    C2  . Bulging of thoracic intervertebral disc    t4  . Depression   . Diabetes mellitus without complication ([place])   . Hyperlipidemia   . PTSD (post-traumatic stress disorder)     Past Surgical History:  Procedure Laterality Date  . CATARACT EXTRACTION Right 08/11/2017  . CATARACT EXTRACTION Left 09/01/2017  . EYE SURGERY    . HEMICOLECTOMY  1997  . JOINT REPLACEMENT  2013   hip     reports that she has never smoked. She has never used smokeless tobacco. She reports that she does not drink alcohol or use drugs. Social History   Socioeconomic History  . Marital status: Divorced    Spouse name: Not on file  . Number of children: Not on file  .  Years of education: Not on file  . Highest education level: Not on file  Occupational History  . Not on file  Social Needs  . Financial resource strain: Not hard at all  . Food insecurity:    Worry: Never true    Inability: Never true  . Transportation needs:    Medical: No    Non-medical: No  Tobacco Use  . Smoking status: Never Smoker  . Smokeless tobacco: Never Used  Substance and Sexual Activity  . Alcohol use: No  . Drug use: No  . Sexual activity: Not Currently  Lifestyle  . Physical activity:    Days per week: 0 days    Minutes per session: 0 min  . Stress: Rather much    Relationships  . Social connections:    Talks on phone: Once a week    Gets together: Once a week    Attends religious service: Never    Active member of club or organization: No    Attends meetings of clubs or organizations: Never    Relationship status: Divorced  . Intimate partner violence:    Fear of current or ex partner: No    Emotionally abused: No    Physically abused: No    Forced sexual activity: No  Other Topics Concern  . Not on file  Social History Narrative   Diet? ? Fresh food- very little prepared food- no fast food.       Do you drink/eat things with caffeine?  Yes      Marital status?         Divorced                           What year were you married? 1978      Do you live in a house, apartment, assisted living, condo, trailer, etc.? apartment      Is it one or more stories? Yes      How many persons live in your home? 1      Do you have any pets in your home? (please list)  1 cat, Polly      Current or past profession: Air traffic controller, sales       Do you exercise?       yes                               Type & how often? 2-3 times a week      Do you have a living will? No      Do you have a DNR form?    no                              If not, do you want to discuss one? yes      Do you have signed POA/HPOA for forms? no    Family History  Problem Relation Age of Onset  . Ovarian cancer Mother   . Lymphoma Father   . Colon cancer Sister     Allergies  Allergen Reactions  . Oxycodone     Migraines  . Statins     "makes my urine turn brown"    Outpatient Encounter Medications as of 09/30/2017  Medication Sig  . Ascorbic Acid (VITAMIN C) 1000 MG tablet Take 1,000 mg by mouth daily.  . Blood Glucose Calibration (  ACCU-CHEK AVIVA) SOLN Use As Directed. Dx E11.9  . buPROPion (WELLBUTRIN SR) 150 MG 12 hr tablet Take 1 tablet (150 mg total) by mouth 2 (two) times daily.  . clonazePAM (KLONOPIN) 1 MG tablet Take 0.5-1 mg by mouth 2 (two)  times daily as needed.   . gabapentin (NEURONTIN) 300 MG capsule Take 1 capsule (300 mg total) by mouth 3 (three) times daily.  Marland Kitchen ibuprofen (ADVIL,MOTRIN) 600 MG tablet Take 1 tablet (600 mg total) by mouth every 6 (six) hours as needed.  . lamoTRIgine (LAMICTAL) 200 MG tablet Take 200 mg by mouth 2 (two) times daily.   Marland Kitchen lisdexamfetamine (VYVANSE) 40 MG capsule Take 40 mg by mouth every morning.  . loratadine (CLARITIN) 10 MG tablet Take 1 tablet (10 mg total) by mouth daily.  . metFORMIN (GLUCOPHAGE) 500 MG tablet Take 1 tablet (500 mg total) by mouth 2 (two) times daily with a meal.  . ONE TOUCH LANCETS MISC Use to test blood sugars twice daily DX: E 11.9  . PROLINE PO Take by mouth daily.  . traZODone (DESYREL) 50 MG tablet Take 25 mg by mouth at bedtime as needed.   . [DISCONTINUED] Probiotic Product (SOLUBLE FIBER/PROBIOTICS) CHEW Chew 1 tablet by mouth daily. (Patient not taking: Reported on 09/30/2017)   No facility-administered encounter medications on file as of 09/30/2017.     Review of Systems:  Review of Systems  Unable to perform ROS: Psychiatric disorder    Health Maintenance  Topic Date Due  . FOOT EXAM  05/01/1959  . COLONOSCOPY  05/01/1999  . INFLUENZA VACCINE  10/29/2017  . MAMMOGRAM  12/03/2017  . HEMOGLOBIN A1C  03/17/2018  . OPHTHALMOLOGY EXAM  05/25/2018  . URINE MICROALBUMIN  09/16/2018  . TETANUS/TDAP  09/28/2025  . DEXA SCAN  Completed  . Hepatitis C Screening  Completed  . PNA vac Low Risk Adult  Completed    Physical Exam: Vitals:   09/30/17 1021  BP: (!) 158/68  Pulse: (!) 115  Temp: 98 F (36.7 C)  TempSrc: Oral  SpO2: 97%  Weight: 196 lb (88.9 kg)  Height: _0  (1.6 m)   Body mass index is 34.72 kg/m. Physical Exam  Constitutional: She is oriented to person, place, and time. She appears well-developed and well-nourished.  HENT:  Mouth/Throat: Oropharynx is clear and moist. No oropharyngeal exudate.  MMM; no oral thrush  Eyes: Pupils  are equal, round, and reactive to light. No scleral icterus.  Neck: Neck supple. Carotid bruit is not present. No tracheal deviation present. No thyromegaly present.  Cardiovascular: Regular rhythm and intact distal pulses. Tachycardia present. Exam reveals no gallop and no friction rub.  Murmur (1/6 SEM) heard. Trace LE edema b/l; no calf TTP  Pulmonary/Chest: Effort normal and breath sounds normal. No stridor. No respiratory distress. She has no wheezes. She has no rales.  Abdominal: Soft. Normal appearance and bowel sounds are normal. She exhibits no distension and no mass. There is no hepatomegaly. There is no tenderness. There is no rigidity, no rebound and no guarding. No hernia.  obese  Musculoskeletal: She exhibits edema (small and large joints).  Lymphadenopathy:    She has no cervical adenopathy.  Neurological: She is alert and oriented to person, place, and time. She has normal reflexes.  Skin: Skin is warm and dry. No rash noted.  Psychiatric: Her behavior is normal. Thought content normal. Her speech is rapid and/or pressured. Thought content is not paranoid and not delusional. She exhibits a depressed  mood.    Labs reviewed: Basic Metabolic Panel: Recent Labs    06/22/17 1348 09/15/17 0945  NA 140 140  K 4.5 4.6  CL 106 103  CO2 26 26  GLUCOSE 208* 170*  BUN 25 24  CREATININE 0.98 1.04*  CALCIUM 9.2 9.9  TSH  --  3.34   Liver Function Tests: Recent Labs    06/22/17 1348 09/15/17 0945  AST 14 15  ALT 13 17  BILITOT 0.3 0.4  PROT 6.4 6.7   No results for input(s): LIPASE, AMYLASE in the last 8760 hours. No results for input(s): AMMONIA in the last 8760 hours. CBC: No results for input(s): WBC, NEUTROABS, HGB, HCT, MCV, PLT in the last 8760 hours. Lipid Panel: Recent Labs    09/15/17 0945  CHOL 253*  HDL 48*  LDLCALC 168*  TRIG 209*  CHOLHDL 5.3*   Lab Results  Component Value Date   HGBA1C 6.2 (H) 09/15/2017    Procedures since last visit: No  results found.  Assessment/Plan   ICD-10-CM   1. Hyperlipidemia associated with type 2 diabetes mellitus (HCC) E11.69 Lipid Panel   E78.5   2. Osteopenia, unspecified location M85.80   3. Type 2 diabetes mellitus with other specified complication, without long-term current use of insulin (HCC) E11.69 CMP with eGFR([facility])    Hemoglobin A1c    CBC with Differential/Platelets  4. Bipolar I disorder ([place]) F31.9   5. Seasonal allergic rhinitis, unspecified trigger J30.2 loratadine (CLARITIN) 10 MG tablet  6. Chronic pain syndrome G89.4   7. High risk medication use Z79.899 CMP with eGFR([facility])    CBC with Differential/Platelets   Use saline nasal spray as needed to keep nose moist  RECOMMEND ZETIA FOR CHOLESTEROL - she states she will think about it  Maintain low fat/low cholesterol meals  Continue other medications as ordered  Follow up in 3 mos for CPE/ECG. Fasting labs prior to appt.   Braeton Wolgamott S. Perlie Gold  [facility] [address] [place], [place] [address] [phone] Cell (Monday-Friday 8 AM - 5 PM) [phone] After 5 PM and follow prompts

## 2017-09-30 NOTE — Patient Instructions (Signed)
[name] , Thank you for taking time to come for your Medicare Wellness Visit. I appreciate your ongoing commitment to your health goals. Please review the following plan we discussed and let me know if I can assist you in the future.   Screening recommendations/referrals: Colonoscopy due, please complete cologuard kit that you have at home Mammogram up to date ,due 09/23/2017 Bone Density up to date Recommended yearly ophthalmology/optometry visit for glaucoma screening and checkup Recommended yearly dental visit for hygiene and checkup  Vaccinations: Influenza vaccine up to date, due 2019 fall season Pneumococcal vaccine 23 given today. Up to date, completed Tdap vaccine up to date, due 09/28/2025 Shingles vaccine due, ordered to pharmacy    Advanced directives: Advance directive discussed with you today. I have provided a copy for you to complete at home and have notarized. Once this is complete please bring a copy in to our office so we can scan it into your chart.  Conditions/risks identified: none  Next appointment: [name], RN 10/06/2018 @ 1:30pm   Preventive Care 65 Years and Older, Female Preventive care refers to lifestyle choices and visits with your health care provider that can promote health and wellness. What does preventive care include?  A yearly physical exam. This is also called an annual well check.  Dental exams once or twice a year.  Routine eye exams. Ask your health care provider how often you should have your eyes checked.  Personal lifestyle choices, including:  Daily care of your teeth and gums.  Regular physical activity.  Eating a healthy diet.  Avoiding tobacco and drug use.  Limiting alcohol use.  Practicing safe sex.  Taking low-dose aspirin every day.  Taking vitamin and mineral supplements as recommended by your health care provider. What happens during an annual well check? The services and screenings done by your health care  provider during your annual well check will depend on your age, overall health, lifestyle risk factors, and family history of disease. Counseling  Your health care provider may ask you questions about your:  Alcohol use.  Tobacco use.  Drug use.  Emotional well-being.  Home and relationship well-being.  Sexual activity.  Eating habits.  History of falls.  Memory and ability to understand (cognition).  Work and work Statistician.  Reproductive health. Screening  You may have the following tests or measurements:  Height, weight, and BMI.  Blood pressure.  Lipid and cholesterol levels. These may be checked every 5 years, or more frequently if you are over 9 years old.  Skin check.  Lung cancer screening. You may have this screening every year starting at age 60 if you have a 30-pack-year history of smoking and currently smoke or have quit within the past 15 years.  Fecal occult blood test (FOBT) of the stool. You may have this test every year starting at age 57.  Flexible sigmoidoscopy or colonoscopy. You may have a sigmoidoscopy every 5 years or a colonoscopy every 10 years starting at age 5.  Hepatitis C blood test.  Hepatitis B blood test.  Sexually transmitted disease (STD) testing.  Diabetes screening. This is done by checking your blood sugar (glucose) after you have not eaten for a while (fasting). You may have this done every 1-3 years.  Bone density scan. This is done to screen for osteoporosis. You may have this done starting at age 48.  Mammogram. This may be done every 1-2 years. Talk to your health care provider about how often you should  have regular mammograms. Talk with your health care provider about your test results, treatment options, and if necessary, the need for more tests. Vaccines  Your health care provider may recommend certain vaccines, such as:  Influenza vaccine. This is recommended every year.  Tetanus, diphtheria, and acellular  pertussis (Tdap, Td) vaccine. You may need a Td booster every 10 years.  Zoster vaccine. You may need this after age 53.  Pneumococcal 13-valent conjugate (PCV13) vaccine. One dose is recommended after age 53.  Pneumococcal polysaccharide (PPSV23) vaccine. One dose is recommended after age 89. Talk to your health care provider about which screenings and vaccines you need and how often you need them. This information is not intended to replace advice given to you by your health care provider. Make sure you discuss any questions you have with your health care provider. Document Released: 04/13/2015 Document Revised: 12/05/2015 Document Reviewed: 01/16/2015 Elsevier Interactive Patient Education  2017 [place] Prevention in the Home Falls can cause injuries. They can happen to people of all ages. There are many things you can do to make your home safe and to help prevent falls. What can I do on the outside of my home?  Regularly fix the edges of walkways and driveways and fix any cracks.  Remove anything that might make you trip as you walk through a door, such as a raised step or threshold.  Trim any bushes or trees on the path to your home.  Use bright outdoor lighting.  Clear any walking paths of anything that might make someone trip, such as rocks or tools.  Regularly check to see if handrails are loose or broken. Make sure that both sides of any steps have handrails.  Any raised decks and porches should have guardrails on the edges.  Have any leaves, snow, or ice cleared regularly.  Use sand or salt on walking paths during winter.  Clean up any spills in your garage right away. This includes oil or grease spills. What can I do in the bathroom?  Use night lights.  Install grab bars by the toilet and in the tub and shower. Do not use towel bars as grab bars.  Use non-skid mats or decals in the tub or shower.  If you need to sit down in the shower, use a plastic,  non-slip stool.  Keep the floor dry. Clean up any water that spills on the floor as soon as it happens.  Remove soap buildup in the tub or shower regularly.  Attach bath mats securely with double-sided non-slip rug tape.  Do not have throw rugs and other things on the floor that can make you trip. What can I do in the bedroom?  Use night lights.  Make sure that you have a light by your bed that is easy to reach.  Do not use any sheets or blankets that are too big for your bed. They should not hang down onto the floor.  Have a firm chair that has side arms. You can use this for support while you get dressed.  Do not have throw rugs and other things on the floor that can make you trip. What can I do in the kitchen?  Clean up any spills right away.  Avoid walking on wet floors.  Keep items that you use a lot in easy-to-reach places.  If you need to reach something above you, use a strong step stool that has a grab bar.  Keep electrical cords out of  the way.  Do not use floor polish or wax that makes floors slippery. If you must use wax, use non-skid floor wax.  Do not have throw rugs and other things on the floor that can make you trip. What can I do with my stairs?  Do not leave any items on the stairs.  Make sure that there are handrails on both sides of the stairs and use them. Fix handrails that are broken or loose. Make sure that handrails are as long as the stairways.  Check any carpeting to make sure that it is firmly attached to the stairs. Fix any carpet that is loose or worn.  Avoid having throw rugs at the top or bottom of the stairs. If you do have throw rugs, attach them to the floor with carpet tape.  Make sure that you have a light switch at the top of the stairs and the bottom of the stairs. If you do not have them, ask someone to add them for you. What else can I do to help prevent falls?  Wear shoes that:  Do not have high heels.  Have rubber  bottoms.  Are comfortable and fit you well.  Are closed at the toe. Do not wear sandals.  If you use a stepladder:  Make sure that it is fully opened. Do not climb a closed stepladder.  Make sure that both sides of the stepladder are locked into place.  Ask someone to hold it for you, if possible.  Clearly mark and make sure that you can see:  Any grab bars or handrails.  First and last steps.  Where the edge of each step is.  Use tools that help you move around (mobility aids) if they are needed. These include:  Canes.  Walkers.  Scooters.  Crutches.  Turn on the lights when you go into a dark area. Replace any light bulbs as soon as they burn out.  Set up your furniture so you have a clear path. Avoid moving your furniture around.  If any of your floors are uneven, fix them.  If there are any pets around you, be aware of where they are.  Review your medicines with your doctor. Some medicines can make you feel dizzy. This can increase your chance of falling. Ask your doctor what other things that you can do to help prevent falls. This information is not intended to replace advice given to you by your health care provider. Make sure you discuss any questions you have with your health care provider. Document Released: 01/11/2009 Document Revised: 08/23/2015 Document Reviewed: 04/21/2014 Elsevier Interactive Patient Education  2017 [employer].

## 2017-09-30 NOTE — Patient Instructions (Addendum)
Use saline nasal spray as needed to keep nose moist  RECOMMEND ZETIA FOR CHOLESTEROL  Maintain low fat/low cholesterol meals  Continue other medications as ordered  Follow up in 3 mos for CPE/ECG. Fasting labs prior to appt.  Ezetimibe Tablets What is this medicine? EZETIMIBE (ez ET i mibe) blocks the absorption of cholesterol from the stomach. It can help lower blood cholesterol for patients who are at risk of getting heart disease or a stroke. It is only for patients whose cholesterol level is not controlled by diet. This medicine may be used for other purposes; ask your health care provider or pharmacist if you have questions. COMMON BRAND NAME(S): Zetia What should I tell my health care provider before I take this medicine? They need to know if you have any of these conditions: -liver disease -an unusual or allergic reaction to ezetimibe, medicines, foods, dyes, or preservatives -pregnant or trying to get pregnant -breast-feeding How should I use this medicine? Take this medicine by mouth with a glass of water. Follow the directions on the prescription label. This medicine can be taken with or without food. Take your doses at regular intervals. Do not take your medicine more often than directed. Talk to your pediatrician regarding the use of this medicine in children. Special care may be needed. Overdosage: If you think you have taken too much of this medicine contact a poison control center or emergency room at once. NOTE: This medicine is only for you. Do not share this medicine with others. What if I miss a dose? If you miss a dose, take it as soon as you can. If it is almost time for your next dose, take only that dose. Do not take double or extra doses. What may interact with this medicine? Do not take this medicine with any of the following medications: -fenofibrate -gemfibrozil This medicine may also interact with the following  medications: -antacids -cyclosporine -herbal medicines like red yeast rice -other medicines to lower cholesterol or triglycerides This list may not describe all possible interactions. Give your health care provider a list of all the medicines, herbs, non-prescription drugs, or dietary supplements you use. Also tell them if you smoke, drink alcohol, or use illegal drugs. Some items may interact with your medicine. What should I watch for while using this medicine? Visit your doctor or health care professional for regular checks on your progress. You will need to have your cholesterol levels checked. If you are also taking some other cholesterol medicines, you will also need to have tests to make sure your liver is working properly. Tell your doctor or health care professional if you get any unexplained muscle pain, tenderness, or weakness, especially if you also have a fever and tiredness. You need to follow a low-cholesterol, low-fat diet while you are taking this medicine. This will decrease your risk of getting heart and blood vessel disease. Exercising and avoiding alcohol and smoking can also help. Ask your doctor or dietician for advice. What side effects may I notice from receiving this medicine? Side effects that you should report to your doctor or health care professional as soon as possible: -allergic reactions like skin rash, itching or hives, swelling of the face, lips, or tongue -dark yellow or brown urine -unusually weak or tired -yellowing of the skin or eyes Side effects that usually do not require medical attention (report to your doctor or health care professional if they continue or are bothersome): -diarrhea -dizziness -headache -stomach upset or pain This list  may not describe all possible side effects. Call your doctor for medical advice about side effects. You may report side effects to FDA at 1-800-FDA-1088. Where should I keep my medicine? Keep out of the reach of  children. Store at room temperature between 15 and 30 degrees C (59 and 86 degrees F). Protect from moisture. Keep container tightly closed. Throw away any unused medicine after the expiration date. NOTE: This sheet is a summary. It may not cover all possible information. If you have questions about this medicine, talk to your doctor, pharmacist, or health care provider.  2018 Elsevier/Gold Standard (2011-09-22 15:39:09)

## 2017-09-30 NOTE — Progress Notes (Signed)
Subjective:   Kaitlyn Wood is a 67 y.o. female who presents for Medicare Annual (Subsequent) preventive examination   Last AWV-04/04/2016    Objective:     Vitals: BP (!) 158/68 (BP Location: Left Arm, Patient Position: Sitting)   Pulse (!) 108   Temp 98 F (36.7 C) (Oral)   Ht 5' 3" (1.6 m)   Wt 196 lb (88.9 kg)   SpO2 97%   BMI 34.72 kg/m   Body mass index is 34.72 kg/m. Provider notified  Advanced Directives 09/30/2017 12/29/2016 09/23/2016 09/08/2016 04/04/2016 01/04/2016 10/09/2015  Does Patient Have a Medical Advance Directive? _0  No No  Would patient like information on creating a medical advance directive? Yes (MAU/Ambulatory/Procedural Areas - Information given) - - - No - Patient declined Yes - Scientist, clinical (histocompatibility and immunogenetics) given -    Tobacco Social History   Tobacco Use  Smoking Status Never Smoker  Smokeless Tobacco Never Used     Counseling given: Not Answered   Clinical Intake:  Pre-visit preparation completed: No  Pain : No/denies pain     Nutritional Risks: None Diabetes: Yes CBG done?: No Did pt. bring in CBG monitor from home?: No  How often do you need to have someone help you when you read instructions, pamphlets, or other written materials from your doctor or pharmacy?: 1 - Never What is the last grade level you completed in school?: Graduate  Interpreter Needed?: No  Information entered by :: [name], RN  Past Medical History:  Diagnosis Date  . Anxiety   . Bipolar 1 disorder ([place])   . Bulging of cervical intervertebral disc    C2  . Bulging of thoracic intervertebral disc    t4  . Depression   . Diabetes mellitus without complication ([place])   . Hyperlipidemia   . PTSD (post-traumatic stress disorder)    Past Surgical History:  Procedure Laterality Date  . CATARACT EXTRACTION Right 08/11/2017  . CATARACT EXTRACTION Left 09/01/2017  . EYE SURGERY    . HEMICOLECTOMY  1997  . JOINT REPLACEMENT  2013   hip   Family  History  Problem Relation Age of Onset  . Ovarian cancer Mother   . Lymphoma Father   . Colon cancer Sister    Social History   Socioeconomic History  . Marital status: Divorced    Spouse name: Not on file  . Number of children: Not on file  . Years of education: Not on file  . Highest education level: Not on file  Occupational History  . Not on file  Social Needs  . Financial resource strain: Not hard at all  . Food insecurity:    Worry: Never true    Inability: Never true  . Transportation needs:    Medical: No    Non-medical: No  Tobacco Use  . Smoking status: Never Smoker  . Smokeless tobacco: Never Used  Substance and Sexual Activity  . Alcohol use: No  . Drug use: No  . Sexual activity: Not Currently  Lifestyle  . Physical activity:    Days per week: 0 days    Minutes per session: 0 min  . Stress: Rather much  Relationships  . Social connections:    Talks on phone: Once a week    Gets together: Once a week    Attends religious service: Never    Active member of club or organization: No    Attends meetings of clubs or organizations: Never  Relationship status: Divorced  Other Topics Concern  . Not on file  Social History Narrative   Diet? ? Fresh food- very little prepared food- no fast food.       Do you drink/eat things with caffeine?  Yes      Marital status?         Divorced                           What year were you married? 1978      Do you live in a house, apartment, assisted living, condo, trailer, etc.? apartment      Is it one or more stories? Yes      How many persons live in your home? 1      Do you have any pets in your home? (please list)  1 cat, Polly      Current or past profession: Air traffic controller, sales       Do you exercise?       yes                               Type & how often? 2-3 times a week      Do you have a living will? No      Do you have a DNR form?    no                              If not, do you want  to discuss one? yes      Do you have signed POA/HPOA for forms? no    Outpatient Encounter Medications as of 09/30/2017  Medication Sig  . Ascorbic Acid (VITAMIN C) 1000 MG tablet Take 1,000 mg by mouth daily.  . Blood Glucose Calibration (ACCU-CHEK AVIVA) SOLN Use As Directed. Dx E11.9  . buPROPion (WELLBUTRIN SR) 150 MG 12 hr tablet Take 1 tablet (150 mg total) by mouth 2 (two) times daily.  . clonazePAM (KLONOPIN) 1 MG tablet Take 0.5-1 mg by mouth 2 (two) times daily as needed.   . gabapentin (NEURONTIN) 300 MG capsule Take 1 capsule (300 mg total) by mouth 3 (three) times daily.  Marland Kitchen ibuprofen (ADVIL,MOTRIN) 600 MG tablet Take 1 tablet (600 mg total) by mouth every 6 (six) hours as needed.  . lamoTRIgine (LAMICTAL) 200 MG tablet Take 200 mg by mouth 2 (two) times daily.   Marland Kitchen lisdexamfetamine (VYVANSE) 40 MG capsule Take 40 mg by mouth every morning.  . loratadine (CLARITIN) 10 MG tablet Take 1 tablet (10 mg total) by mouth daily.  . metFORMIN (GLUCOPHAGE) 500 MG tablet Take 1 tablet (500 mg total) by mouth 2 (two) times daily with a meal.  . ONE TOUCH LANCETS MISC Use to test blood sugars twice daily DX: E 11.9  . PROLINE PO Take by mouth daily.  . traZODone (DESYREL) 50 MG tablet Take 25 mg by mouth at bedtime as needed.   . Zoster Vaccine Adjuvanted [facility]) injection Inject 0.5 mLs into the muscle once.  . [DISCONTINUED] Probiotic Product (SOLUBLE FIBER/PROBIOTICS) CHEW Chew 1 tablet by mouth daily. (Patient not taking: Reported on 09/30/2017)   No facility-administered encounter medications on file as of 09/30/2017.     Activities of Daily Living In your present state of health, do you have any difficulty performing the following activities: 09/30/2017  Hearing?  N  Vision? N  Difficulty concentrating or making decisions? N  Walking or climbing stairs? N  Dressing or bathing? N  Doing errands, shopping? N  Preparing Food and eating ? N  Using the Toilet? N  In the past six months,  have you accidently leaked urine? N  Do you have problems with loss of bowel control? N  Managing your Medications? N  Managing your Finances? N  Housekeeping or managing your Housekeeping? N  Some recent data might be hidden    Patient Care Team: Gildardo Cranker, DO as PCP - General (Internal Medicine) Brien Few, MD as Consulting Physician (Obstetrics and Gynecology) Katy Apo, MD as Consulting Physician (Ophthalmology)    Assessment:   This is a routine wellness examination for Kaitlyn Wood.  Exercise Activities and Dietary recommendations Current Exercise Habits: The patient does not participate in regular exercise at present, Exercise limited by: psychological condition(s)  Goals    None      Fall Risk Fall Risk  09/30/2017 06/22/2017 05/28/2017 01/06/2017 12/29/2016  Falls in the past year? _0   Number falls in past yr: 2 or more 2 or more 2 or more 2 or more 2 or more  Injury with Fall? No Yes Yes Yes Yes  Comment - - Patient c/o of knot in head from fall about 3 weeks ago Head injury once  -   Is the patient's home free of loose throw rugs in walkways, pet beds, electrical cords, etc?   yes      Grab bars in the bathroom? yes      Handrails on the stairs?   yes      Adequate lighting?   yes  Timed Get Up and Go performed: 14 seconds  Depression Screen PHQ 2/9 Scores 09/30/2017 01/06/2017 01/04/2016  PHQ - 2 Score _1 PHQ- 9 Score 15 18 -     Cognitive Function MMSE - Mini Mental State Exam 09/30/2017 04/04/2016  Orientation to time 5 5  Orientation to Place 5 5  Registration 3 3  Attention/ Calculation 4 5  Recall 2 2  Language- name 2 objects 2 2  Language- repeat 1 1  Language- follow 3 step command 3 3  Language- read & follow direction 1 1  Write a sentence 1 1  Copy design 0 1  Total score 27 29        Immunization History  Administered Date(s) Administered  . Influenza,inj,Quad PF,6+ Mos 04/04/2016  . Influenza-Unspecified  12/29/2013  . Pneumococcal Conjugate-13 01/04/2016  . Pneumococcal Polysaccharide-23 09/30/2017  . Tdap 09/29/2015    Qualifies for Shingles Vaccine? Yes, educated and ordered to pharmacy  Screening Tests Health Maintenance  Topic Date Due  . FOOT EXAM  [birth year]01/1960  . COLONOSCOPY  05/01/1999  . INFLUENZA VACCINE  10/29/2017  . MAMMOGRAM  12/03/2017  . HEMOGLOBIN A1C  03/17/2018  . OPHTHALMOLOGY EXAM  05/25/2018  . URINE MICROALBUMIN  09/16/2018  . TETANUS/TDAP  09/28/2025  . DEXA SCAN  Completed  . Hepatitis C Screening  Completed  . PNA vac Low Risk Adult  Completed    Cancer Screenings: Lung: Low Dose CT Chest recommended if Age 26-80 years, 30 pack-year currently smoking OR have quit w/in 15years. Patient does not qualify. Breast:  Up to date on Mammogram? Yes   Up to date of Bone Density/Dexa? Yes Colorectal: due, has cologuard kit at home to complete  Additional Screenings:  Hepatitis C Screening: declined PNA 23  given today    Plan:  I have personally reviewed and addressed the Medicare Annual Wellness questionnaire and have noted the following in the patient's chart:  A. Medical and social history B. Use of alcohol, tobacco or illicit drugs  C. Current medications and supplements D. Functional ability and status E.  Nutritional status F.  Physical activity G. Advance directives H. List of other physicians I.  Hospitalizations, surgeries, and ER visits in previous 12 months J.  Vitals K. Screenings to include hearing, vision, cognitive, depression L. Referrals and appointments - none  In addition, I have reviewed and discussed with patient certain preventive protocols, quality metrics, and best practice recommendations. A written personalized care plan for preventive services as well as general preventive health recommendations were provided to patient.  See attached scanned questionnaire for additional information.   Signed,   Sara Saunders, RN Nurse  Health Advisor  Patient Concerns: Green phlegm with bright red blood since February out nose 

## 2017-10-07 DIAGNOSIS — F3131 Bipolar disorder, current episode depressed, mild: Secondary | ICD-10-CM | POA: Diagnosis not present

## 2017-10-14 DIAGNOSIS — M170 Bilateral primary osteoarthritis of knee: Secondary | ICD-10-CM | POA: Diagnosis not present

## 2017-10-14 DIAGNOSIS — M1711 Unilateral primary osteoarthritis, right knee: Secondary | ICD-10-CM | POA: Diagnosis not present

## 2017-10-14 DIAGNOSIS — M1611 Unilateral primary osteoarthritis, right hip: Secondary | ICD-10-CM | POA: Diagnosis not present

## 2017-10-14 DIAGNOSIS — M1712 Unilateral primary osteoarthritis, left knee: Secondary | ICD-10-CM | POA: Diagnosis not present

## 2017-10-14 DIAGNOSIS — Z471 Aftercare following joint replacement surgery: Secondary | ICD-10-CM | POA: Diagnosis not present

## 2017-10-14 DIAGNOSIS — Z96642 Presence of left artificial hip joint: Secondary | ICD-10-CM | POA: Diagnosis not present

## 2017-10-19 ENCOUNTER — Ambulatory Visit: Payer: Medicare HMO | Admitting: Internal Medicine

## 2017-11-04 DIAGNOSIS — F3131 Bipolar disorder, current episode depressed, mild: Secondary | ICD-10-CM | POA: Diagnosis not present

## 2017-11-18 ENCOUNTER — Encounter: Payer: Self-pay | Admitting: Internal Medicine

## 2017-12-08 DIAGNOSIS — F3131 Bipolar disorder, current episode depressed, mild: Secondary | ICD-10-CM | POA: Diagnosis not present

## 2017-12-14 DIAGNOSIS — F3131 Bipolar disorder, current episode depressed, mild: Secondary | ICD-10-CM | POA: Diagnosis not present

## 2017-12-30 ENCOUNTER — Encounter: Payer: Self-pay | Admitting: Internal Medicine

## 2017-12-30 ENCOUNTER — Ambulatory Visit (INDEPENDENT_AMBULATORY_CARE_PROVIDER_SITE_OTHER): Payer: Medicare HMO | Admitting: Internal Medicine

## 2017-12-30 VITALS — BP 148/90 | HR 111 | Temp 98.9°F | Ht 63.0 in | Wt 194.0 lb

## 2017-12-30 DIAGNOSIS — F319 Bipolar disorder, unspecified: Secondary | ICD-10-CM

## 2017-12-30 DIAGNOSIS — R197 Diarrhea, unspecified: Secondary | ICD-10-CM

## 2017-12-30 DIAGNOSIS — E1169 Type 2 diabetes mellitus with other specified complication: Secondary | ICD-10-CM

## 2017-12-30 NOTE — Progress Notes (Signed)
Patient ID: Kaitlyn Wood, female   DOB: Aug 06, 1950, 67 y.o.   MRN: [id number]   [facility] OFFICE  Provider: DR Arletha Grippe  Code Status:  Goals of Care:  Advanced Directives 09/30/2017  Does Patient Have a Medical Advance Directive? No  Would patient like information on creating a medical advance directive? Yes (MAU/Ambulatory/Procedural Areas - Information given)     Chief Complaint  Patient presents with  . Acute Visit    Possible parasite in stool, x 2 days ago     HPI: Patient is a 67 y.o. female seen today for an acute visit for diarrhea. She is c/a "segmented parasites" with BMs that "don't break apart". She saved them. She denies N/V, bloody stools, loss of appetite. She has lost some weight but this is intentional. No travel outside the [place] and not aware of consuming contaminated water. She does not have a pet turtle but does have a cat. She has consumed undercooked pork products in the last mo. No rectal itching. She brought in what she believes is parasites in a gallon zip lock baggie - water in bottom of baggie with fruit flies floating upside down in it. I am unable to identify if these are/were viable organisms as the largest sample is wrapped in disposable wipe. They appear to light brown and elongated. She is a poor historian due to psych d/o. Hx obtained from chart and pt.   Bipolar d/o/anxiety/insomnia/PTSD - MDD uncontrolled. she is followed by psych Dr Casimiro Needle. She is taking wellbutrin sr, klonopin, neurontin, lamictal, vyvanse and trazodone. She sees a therapist. She recently had a bout of mania and now back to severe MDD.   DM - she takes metformin daily. BS are normal range and no low BS reactions. A1c 6.8%. She has not had any numbness/tingling. She saw eye Dr in May 2018 and was told no diabetic changes. Urine microalbumin/creatinine ratio 14. Cr 1.04  Cervical/lumbar herniated/bulging discs - C2-T4; she reports no significant pain at this time. She has never had sx.  Initial injury was falling off porch with whiplash injury many years ago. She had an episode when her leg gave away when she went to get out of her car. Gabapentin helps pain.   FHx cancer - Mother with ovarian cancer; sister/MGM with colorectal cancer; Father with Lymphoma  Past Medical History:  Diagnosis Date  . Anxiety   . Bipolar 1 disorder ([place])   . Bulging of cervical intervertebral disc    C2  . Bulging of thoracic intervertebral disc    t4  . Depression   . Diabetes mellitus without complication ([place])   . Hyperlipidemia   . PTSD (post-traumatic stress disorder)     Past Surgical History:  Procedure Laterality Date  . CATARACT EXTRACTION Right 08/11/2017  . CATARACT EXTRACTION Left 09/01/2017  . EYE SURGERY    . HEMICOLECTOMY  1997  . JOINT REPLACEMENT  2013   hip     reports that she has never smoked. She has never used smokeless tobacco. She reports that she does not drink alcohol or use drugs. Social History   Socioeconomic History  . Marital status: Divorced    Spouse name: Not on file  . Number of children: Not on file  . Years of education: Not on file  . Highest education level: Not on file  Occupational History  . Not on file  Social Needs  . Financial resource strain: Not hard at all  . Food insecurity:  Worry: Never true    Inability: Never true  . Transportation needs:    Medical: No    Non-medical: No  Tobacco Use  . Smoking status: Never Smoker  . Smokeless tobacco: Never Used  Substance and Sexual Activity  . Alcohol use: No  . Drug use: No  . Sexual activity: Not Currently  Lifestyle  . Physical activity:    Days per week: 0 days    Minutes per session: 0 min  . Stress: Rather much  Relationships  . Social connections:    Talks on phone: Once a week    Gets together: Once a week    Attends religious service: Never    Active member of club or organization: No    Attends meetings of clubs or organizations: Never    Relationship  status: Divorced  . Intimate partner violence:    Fear of current or ex partner: No    Emotionally abused: No    Physically abused: No    Forced sexual activity: No  Other Topics Concern  . Not on file  Social History Narrative   Diet? ? Fresh food- very little prepared food- no fast food.       Do you drink/eat things with caffeine?  Yes      Marital status?         Divorced                           What year were you married? 1978      Do you live in a house, apartment, assisted living, condo, trailer, etc.? apartment      Is it one or more stories? Yes      How many persons live in your home? 1      Do you have any pets in your home? (please list)  1 cat, Polly      Current or past profession: Air traffic controller, sales       Do you exercise?       yes                               Type & how often? 2-3 times a week      Do you have a living will? No      Do you have a DNR form?    no                              If not, do you want to discuss one? yes      Do you have signed POA/HPOA for forms? no    Family History  Problem Relation Age of Onset  . Ovarian cancer Mother   . Lymphoma Father   . Colon cancer Sister     Allergies  Allergen Reactions  . Oxycodone     Migraines  . Statins     "makes my urine turn brown"    Outpatient Encounter Medications as of 12/30/2017  Medication Sig  . Ascorbic Acid (VITAMIN C) 1000 MG tablet Take 1,000 mg by mouth daily.  . Blood Glucose Calibration (ACCU-CHEK AVIVA) SOLN Use As Directed. Dx E11.9  . buPROPion (WELLBUTRIN SR) 150 MG 12 hr tablet Take 300 mg by mouth daily.  . clonazePAM (KLONOPIN) 1 MG tablet 1 mg at night, 0.5-1 mg during the day if needed  .  gabapentin (NEURONTIN) 300 MG capsule Take 1 capsule (300 mg total) by mouth 3 (three) times daily.  Mar[place] Kitchen ibuprofen (ADVIL,MOTRIN) 600 MG tablet Take 1 tablet (600 mg total) by mouth every 6 (six) hours as needed.  . lamoTRIgine (LAMICTAL) 200 MG tablet Take 200 mg  by mouth 2 (two) times daily.   Mar[place] Kitchen lisdexamfetamine (VYVANSE) 40 MG capsule Take 40 mg by mouth every morning.  . metFORMIN (GLUCOPHAGE) 500 MG tablet Take 1 tablet (500 mg total) by mouth 2 (two) times daily with a meal.  . ONE TOUCH LANCETS MISC Use to test blood sugars twice daily DX: E 11.9  . PROLINE PO Take by mouth daily.  . traZODone (DESYREL) 50 MG tablet Take 25 mg by mouth at bedtime as needed.   . Zoster Vaccine Adjuvanted [facility]) injection Inject 0.5 mLs into the muscle once.  . [DISCONTINUED] buPROPion (WELLBUTRIN SR) 150 MG 12 hr tablet Take 1 tablet (150 mg total) by mouth 2 (two) times daily.  . [DISCONTINUED] loratadine (CLARITIN) 10 MG tablet Take 1 tablet (10 mg total) by mouth daily.   No facility-administered encounter medications on file as of 12/30/2017.     Review of Systems:  Review of Systems  Unable to perform ROS: Psychiatric disorder    Health Maintenance  Topic Date Due  . FOOT EXAM  05/01/1959  . COLONOSCOPY  05/01/1999  . INFLUENZA VACCINE  10/29/2017  . MAMMOGRAM  12/03/2017  . HEMOGLOBIN A1C  03/17/2018  . OPHTHALMOLOGY EXAM  05/25/2018  . URINE MICROALBUMIN  09/16/2018  . TETANUS/TDAP  09/28/2025  . DEXA SCAN  Completed  . Hepatitis C Screening  Completed  . PNA vac Low Risk Adult  Completed    Physical Exam: Vitals:   12/30/17 1439  BP: (!) 148/90  Pulse: (!) 111  Temp: 98.9 F (37.2 C)  TempSrc: Oral  SpO2: 97%  Weight: 194 lb (88 kg)  Height: 5\' 3"  (1.6 m)   Body mass index is 34.37 kg/m. Physical Exam  Constitutional: She appears well-developed and well-nourished.  HENT:  Mouth/Throat: Oropharynx is clear and moist. No oropharyngeal exudate.  MMM; no oral thrush  Eyes: Pupils are equal, round, and reactive to light. No scleral icterus.  Neck: Neck supple. Carotid bruit is not present. No tracheal deviation present. No thyromegaly present.  Cardiovascular: Regular rhythm and intact distal pulses. Tachycardia present. Exam  reveals no gallop and no friction rub.  Murmur (1/6 SEM) heard. No LE edema b/l; no calf TTP  Pulmonary/Chest: Effort normal and breath sounds normal. No stridor. No respiratory distress. She has no wheezes. She has no rales.  Abdominal: Soft. Normal appearance and bowel sounds are normal. She exhibits distension. She exhibits no mass. There is no hepatomegaly. There is generalized tenderness. There is no rigidity, no rebound and no guarding. No hernia.  Genitourinary: Rectal exam shows external hemorrhoid (nonbleeding). Rectal exam shows no mass, no tenderness, anal tone normal and guaiac negative stool.  Genitourinary Comments: Chaperone present; venous lake noted at approx 5 o'clock position; no pinworms noted; perineum atrophic changes noted  Musculoskeletal: She exhibits edema (small and large joints).  Lymphadenopathy:    She has no cervical adenopathy.  Neurological: She is alert. She has normal reflexes.  Skin: Skin is warm and dry. No rash noted.  Psychiatric: She has a normal mood and affect. Her behavior is normal. Thought content normal.    Labs reviewed: Basic Metabolic Panel: Recent Labs    06/22/17 1348 09/15/17 0945  NA  140 140  K 4.5 4.6  CL 106 103  CO2 26 26  GLUCOSE 208* 170*  BUN 25 24  CREATININE 0.98 1.04*  CALCIUM 9.2 9.9  TSH  --  3.34   Liver Function Tests: Recent Labs    06/22/17 1348 09/15/17 0945  AST 14 15  ALT 13 17  BILITOT 0.3 0.4  PROT 6.4 6.7   No results for input(s): LIPASE, AMYLASE in the last 8760 hours. No results for input(s): AMMONIA in the last 8760 hours. CBC: No results for input(s): WBC, NEUTROABS, HGB, HCT, MCV, PLT in the last 8760 hours. Lipid Panel: Recent Labs    09/15/17 0945  CHOL 253*  HDL 48*  LDLCALC 168*  TRIG 209*  CHOLHDL 5.3*   Lab Results  Component Value Date   HGBA1C 6.2 (H) 09/15/2017    Procedures since last visit: No results found.  Assessment/Plan   ICD-10-CM   1. Diarrhea, unspecified  type R19.7 Clostridium difficile culture-fecal    Ova and Parasite Exam    Stool culture    Fecal Lactoferrin   possible infectious 2/2 undercooked pork  2. Type 2 diabetes mellitus with other specified complication, without long-term current use of insulin (HCC) E11.69   3. Bipolar I disorder ([place]) F31.9    Await stool results prior to any tx decision  Avoid undercooked meat products  [place] HANDS FREQUENTLY  Continue current medications as ordered  Will call with lab results - further recommendations to follow  Follow up as scheduled or sooner if need be  [facility] S. Perlie Gold  [facility] [address] [place], [place] [address] [phone] Cell (Monday-Friday 8 AM - 5 PM) [phone] After 5 PM and follow prompts

## 2017-12-30 NOTE — Patient Instructions (Addendum)
Avoid undercooked meat products  [place] HAND FREQUENTLY  Continue current medications as ordered  Will call with lab results - further recommendations to follow  Follow up as scheduled or sooner if need be   Diarrhea, Adult Diarrhea is when you have loose and water poop (stool) often. Diarrhea can make you feel weak and cause you to get dehydrated. Dehydration can make you tired and thirsty, make you have a dry mouth, and make it so you pee (urinate) less often. Diarrhea often lasts 2-3 days. However, it can last longer if it is a sign of something more serious. It is important to treat your diarrhea as told by your doctor. Follow these instructions at home: Eating and drinking  Follow these recommendations as told by your doctor:  Take an oral rehydration solution (ORS). This is a drink that is sold at pharmacies and stores.  Drink clear fluids, such as: ? Water. ? Ice chips. ? Diluted fruit juice. ? Low-calorie sports drinks.  Eat bland, easy-to-digest foods in small amounts as you are able. These foods include: ? Bananas. ? Applesauce. ? Rice. ? Low-fat (lean) meats. ? Toast. ? Crackers.  Avoid drinking fluids that have a lot of sugar or caffeine in them.  Avoid alcohol.  Avoid spicy or fatty foods.  General instructions   Drink enough fluid to keep your pee (urine) clear or pale yellow.  Wash your hands often. If you cannot use soap and water, use hand sanitizer.  Make sure that all people in your home wash their hands well and often.  Take over-the-counter and prescription medicines only as told by your doctor.  Rest at home while you get better.  Watch your condition for any changes.  Take a warm bath to help with any burning or pain from having diarrhea.  Keep all follow-up visits as told by your doctor. This is important. Contact a doctor if:  You have a fever.  Your diarrhea gets worse.  You have new symptoms.  You cannot keep fluids  down.  You feel light-headed or dizzy.  You have a headache.  You have muscle cramps. Get help right away if:  You have chest pain.  You feel very weak or you pass out (faint).  You have bloody or black poop or poop that look like tar.  You have very bad pain, cramping, or bloating in your belly (abdomen).  You have trouble breathing or you are breathing very quickly.  Your heart is beating very quickly.  Your skin feels cold and clammy.  You feel confused.  You have signs of dehydration, such as: ? Dark pee, hardly any pee, or no pee. ? Cracked lips. ? Dry mouth. ? Sunken eyes. ? Sleepiness. ? Weakness. This information is not intended to replace advice given to you by your health care provider. Make sure you discuss any questions you have with your health care provider. Document Released: 09/03/2007 Document Revised: 10/05/2015 Document Reviewed: 11/21/2014 Elsevier Interactive Patient Education  2018 [employer].

## 2018-01-01 ENCOUNTER — Other Ambulatory Visit: Payer: Medicare HMO

## 2018-01-04 ENCOUNTER — Other Ambulatory Visit: Payer: Medicare HMO

## 2018-01-04 DIAGNOSIS — Z79899 Other long term (current) drug therapy: Secondary | ICD-10-CM | POA: Diagnosis not present

## 2018-01-04 DIAGNOSIS — E1169 Type 2 diabetes mellitus with other specified complication: Secondary | ICD-10-CM | POA: Diagnosis not present

## 2018-01-04 DIAGNOSIS — R197 Diarrhea, unspecified: Secondary | ICD-10-CM | POA: Diagnosis not present

## 2018-01-04 DIAGNOSIS — E785 Hyperlipidemia, unspecified: Secondary | ICD-10-CM | POA: Diagnosis not present

## 2018-01-05 ENCOUNTER — Ambulatory Visit (INDEPENDENT_AMBULATORY_CARE_PROVIDER_SITE_OTHER): Payer: Medicare HMO | Admitting: Internal Medicine

## 2018-01-05 VITALS — BP 148/82 | HR 118 | Temp 97.8°F | Ht 63.0 in | Wt 195.8 lb

## 2018-01-05 DIAGNOSIS — F319 Bipolar disorder, unspecified: Secondary | ICD-10-CM | POA: Diagnosis not present

## 2018-01-05 DIAGNOSIS — Z79899 Other long term (current) drug therapy: Secondary | ICD-10-CM

## 2018-01-05 DIAGNOSIS — R030 Elevated blood-pressure reading, without diagnosis of hypertension: Secondary | ICD-10-CM

## 2018-01-05 DIAGNOSIS — E1169 Type 2 diabetes mellitus with other specified complication: Secondary | ICD-10-CM

## 2018-01-05 DIAGNOSIS — G894 Chronic pain syndrome: Secondary | ICD-10-CM

## 2018-01-05 DIAGNOSIS — Z0000 Encounter for general adult medical examination without abnormal findings: Secondary | ICD-10-CM

## 2018-01-05 DIAGNOSIS — R197 Diarrhea, unspecified: Secondary | ICD-10-CM | POA: Diagnosis not present

## 2018-01-05 DIAGNOSIS — E785 Hyperlipidemia, unspecified: Secondary | ICD-10-CM | POA: Diagnosis not present

## 2018-01-05 LAB — CBC WITH DIFFERENTIAL/PLATELET
BASOS ABS: 22 {cells}/uL (ref 0–200)
Basophils Relative: 0.4 %
EOS ABS: 38 {cells}/uL (ref 15–500)
Eosinophils Relative: 0.7 %
HEMATOCRIT: 42.1 % (ref 35.0–45.0)
Hemoglobin: 14.1 g/dL (ref 11.7–15.5)
Lymphs Abs: 1561 cells/uL (ref 850–3900)
MCH: 30 pg (ref 27.0–33.0)
MCHC: 33.5 g/dL (ref 32.0–36.0)
MCV: 89.6 fL (ref 80.0–100.0)
MPV: 10.7 fL (ref 7.5–12.5)
Monocytes Relative: 7.1 %
NEUTROS PCT: 62.9 %
Neutro Abs: 3397 cells/uL (ref 1500–7800)
Platelets: 237 10*3/uL (ref 140–400)
RBC: 4.7 10*6/uL (ref 3.80–5.10)
RDW: 12.1 % (ref 11.0–15.0)
TOTAL LYMPHOCYTE: 28.9 %
WBC: 5.4 10*3/uL (ref 3.8–10.8)
WBCMIX: 383 {cells}/uL (ref 200–950)

## 2018-01-05 LAB — COMPLETE METABOLIC PANEL WITH GFR
AG RATIO: 2 (calc) (ref 1.0–2.5)
ALBUMIN MSPROF: 4.5 g/dL (ref 3.6–5.1)
ALT: 17 U/L (ref 6–29)
AST: 16 U/L (ref 10–35)
Alkaline phosphatase (APISO): 71 U/L (ref 33–130)
BUN / CREAT RATIO: 20 (calc) (ref 6–22)
BUN: 20 mg/dL (ref 7–25)
CALCIUM: 9.5 mg/dL (ref 8.6–10.4)
CO2: 27 mmol/L (ref 20–32)
Chloride: 102 mmol/L (ref 98–110)
Creat: 1.01 mg/dL — ABNORMAL HIGH (ref 0.50–0.99)
GFR, EST NON AFRICAN AMERICAN: 57 mL/min/{1.73_m2} — AB (ref >=60)
GFR, Est African American: 66 mL/min/{1.73_m2} (ref >=60)
GLOBULIN: 2.3 g/dL (ref 1.9–3.7)
Glucose, Bld: 131 mg/dL — ABNORMAL HIGH (ref 65–99)
POTASSIUM: 4.2 mmol/L (ref 3.5–5.3)
SODIUM: 139 mmol/L (ref 135–146)
Total Bilirubin: 0.4 mg/dL (ref 0.2–1.2)
Total Protein: 6.8 g/dL (ref 6.1–8.1)

## 2018-01-05 LAB — LIPID PANEL
CHOL/HDL RATIO: 4.6 (calc) (ref <5.0)
CHOLESTEROL: 261 mg/dL — AB (ref <200)
HDL: 57 mg/dL (ref >50)
LDL CHOLESTEROL (CALC): 168 mg/dL — AB
Non-HDL Cholesterol (Calc): 204 mg/dL (calc) — ABNORMAL HIGH (ref <130)
Triglycerides: 202 mg/dL — ABNORMAL HIGH (ref <150)

## 2018-01-05 LAB — HEMOGLOBIN A1C
EAG (MMOL/L): 7.9 (calc)
HEMOGLOBIN A1C: 6.6 %{Hb} — AB (ref <5.7)
Mean Plasma Glucose: 143 (calc)

## 2018-01-05 NOTE — Progress Notes (Addendum)
Patient ID: Kaitlyn Wood, female   DOB: 08-Aug-1950, 67 y.o.   MRN: [id number]   Location:  [facility]  Place of Service:  OFFICE  Provider: Arletha Grippe, DO  Patient Care Team: Gildardo Cranker, DO as PCP - General (Internal Medicine) Brien Few, MD as Consulting Physician (Obstetrics and Gynecology) Katy Apo, MD as Consulting Physician (Ophthalmology)  Extended Emergency Contact Information Primary Emergency Contact: Groom, [place] Phone: [phone] Relation: Son  Code Status: FULL CODE Goals of Care: Advanced Directive information Advanced Directives 09/30/2017  Does Patient Have a Medical Advance Directive? No  Would patient like information on creating a medical advance directive? Yes (MAU/Ambulatory/Procedural Areas - Information given)     Chief Complaint  Patient presents with  . Annual Exam    Yearly check-up, AWV completed 09/30/17  . Health Maintenance    DM foot exam due     HPI: Patient is a 67 y.o. female seen in today for an extended exam. She completed AWV 09/30/17. MMSE 27/30.   Today she reports increased anxiety. She is followed by psych Dr Casimiro Needle. She continues to have issues with BMs. She brought stool sample with her to office. No f/c. She is a poor historian due to psych d/o. Hx obtained from chart.  Lab results reviewed from yesterday.  Bipolar d/o/anxiety/insomnia/PTSD - MDD uncontrolled. she is followed by psych Dr Casimiro Needle. She is taking wellbutrin sr, klonopin, neurontin, lamictal, vyvanse and trazodone. She sees a therapist. She recently had a bout of mania and now back to severe MDD.   DM - she takes metformin daily. BS are normal range and no low BS reactions. A1c 6.6%. She has not had any numbness/tingling. She saw eye Dr in May 2018 and was told no diabetic changes. Urine microalbumin/creatinine ratio 14. Cr 1.04. She reports statin intolerance - urine turns brown  Cervical/lumbar  herniated/bulging discs - C2-T4; she reports no significant pain at this time. She has never had sx. Initial injury was falling off porch with whiplash injury many years ago. She had an episode when her leg gave away when she went to get out of her car. Gabapentin helps pain.   FHx cancer - Mother with ovarian cancer; sister/MGM with colorectal cancer; Father with Lymphoma   Depression screen [facility] 2/9 09/30/2017 01/06/2017 01/04/2016  Decreased Interest 3 3 1   Down, Depressed, Hopeless 3 3 1   PHQ - 2 Score 6 6 2   Altered sleeping 3 3 -  Tired, decreased energy 3 3 -  Change in appetite 0 3 -  Feeling bad or failure about yourself  3 3 -  Trouble concentrating 0 0 -  Moving slowly or fidgety/restless 0 0 -  Suicidal thoughts 0 0 -  PHQ-9 Score 15 18 -  Difficult doing work/chores Very difficult - -    Fall Risk  12/30/2017 09/30/2017 06/22/2017 05/28/2017 01/06/2017  Falls in the past year? Yes Yes Yes Yes Yes  Number falls in past yr: 2 or more 2 or more 2 or more 2 or more 2 or more  Injury with Fall? No No Yes Yes Yes  Comment - - - Patient c/o of knot in head from fall about 3 weeks ago Head injury once    MMSE - Mini Mental State Exam 09/30/2017 04/04/2016  Orientation to time 5 5  Orientation to Place 5 5  Registration 3 3  Attention/ Calculation 4 5  Recall 2 2  Language- name 2 objects 2 2  Language- repeat 1 1  Language- follow 3 step command 3 3  Language- read & follow direction 1 1  Write a sentence 1 1  Copy design 0 1  Total score 27 29     Health Maintenance  Topic Date Due  . FOOT EXAM  05/01/1959  . COLONOSCOPY  05/01/1999  . MAMMOGRAM  12/03/2017  . OPHTHALMOLOGY EXAM  05/25/2018  . HEMOGLOBIN A1C  07/06/2018  . URINE MICROALBUMIN  09/16/2018  . TETANUS/TDAP  09/28/2025  . INFLUENZA VACCINE  Completed  . DEXA SCAN  Completed  . Hepatitis C Screening  Completed  . PNA vac Low Risk Adult  Completed    Urinary incontinence?  Functional Status Survey:     Exercise?  Diet?  No exam data present  Hearing:    Dentition:  Pain:  Past Medical History:  Diagnosis Date  . Anxiety   . Bipolar 1 disorder ([place])   . Bulging of cervical intervertebral disc    C2  . Bulging of thoracic intervertebral disc    t4  . Depression   . Diabetes mellitus without complication ([place])   . Hyperlipidemia   . PTSD (post-traumatic stress disorder)     Past Surgical History:  Procedure Laterality Date  . CATARACT EXTRACTION Right 08/11/2017  . CATARACT EXTRACTION Left 09/01/2017  . EYE SURGERY    . HEMICOLECTOMY  1997  . JOINT REPLACEMENT  2013   hip    Family History  Problem Relation Age of Onset  . Ovarian cancer Mother   . Lymphoma Father   . Colon cancer Sister    Family Status  Relation Name Status  . Mother Mardene Celeste Deceased  . Father Juanda Crumble Deceased  . Sister Legrand Rams Deceased at age 80  . Sister [employer]  . Son [employer]  . Daughter Charlott Rakes  . Son Elise Benne  . Son Juanda Crumble Deceased at age 63       Heroin/clonazipan overdose    shoulder  Social History   Socioeconomic History  . Marital status: Divorced    Spouse name: Not on file  . Number of children: Not on file  . Years of education: Not on file  . Highest education level: Not on file  Occupational History  . Not on file  Social Needs  . Financial resource strain: Not hard at all  . Food insecurity:    Worry: Never true    Inability: Never true  . Transportation needs:    Medical: No    Non-medical: No  Tobacco Use  . Smoking status: Never Smoker  . Smokeless tobacco: Never Used  Substance and Sexual Activity  . Alcohol use: No  . Drug use: No  . Sexual activity: Not Currently  Lifestyle  . Physical activity:    Days per week: 0 days    Minutes per session: 0 min  . Stress: Rather much  Relationships  . Social connections:    Talks on phone: Once a week    Gets together: Once a week    Attends religious service: Never     Active member of club or organization: No    Attends meetings of clubs or organizations: Never    Relationship status: Divorced  . Intimate partner violence:    Fear of current or ex partner: No    Emotionally abused: No    Physically abused: No    Forced sexual activity: No  Other Topics Concern  . Not on file  Social History Narrative   Diet? ? Fresh food- very little prepared food- no fast food.       Do you drink/eat things with caffeine?  Yes      Marital status?         Divorced                           What year were you married? 1978      Do you live in a house, apartment, assisted living, condo, trailer, etc.? apartment      Is it one or more stories? Yes      How many persons live in your home? 1      Do you have any pets in your home? (please list)  1 cat, Polly      Current or past profession: Air traffic controller, sales       Do you exercise?       yes                               Type & how often? 2-3 times a week      Do you have a living will? No      Do you have a DNR form?    no                              If not, do you want to discuss one? yes      Do you have signed POA/HPOA for forms? no    Allergies  Allergen Reactions  . Oxycodone     Migraines  . Statins     "makes my urine turn brown"    Allergies as of 01/05/2018      Reactions   Oxycodone    Migraines   Statins    "makes my urine turn brown"      Medication List        Accurate as of 01/05/18  3:41 PM. Always use your most recent med list.          ACCU-CHEK AVIVA Soln Use As Directed. Dx E11.9   buPROPion 150 MG 12 hr tablet Commonly known as:  WELLBUTRIN SR Take 300 mg by mouth daily.   clonazePAM 1 MG tablet Commonly known as:  KLONOPIN 1 mg at night, 0.5-1 mg during the day if needed   gabapentin 300 MG capsule Commonly known as:  NEURONTIN Take 1 capsule (300 mg total) by mouth 3 (three) times daily.   ibuprofen 600 MG tablet Commonly known as:   ADVIL,MOTRIN Take 1 tablet (600 mg total) by mouth every 6 (six) hours as needed.   lamoTRIgine 200 MG tablet Commonly known as:  LAMICTAL Take 200 mg by mouth 2 (two) times daily.   lisdexamfetamine 40 MG capsule Commonly known as:  VYVANSE Take 40 mg by mouth every morning.   metFORMIN 500 MG tablet Commonly known as:  GLUCOPHAGE Take 1 tablet (500 mg total) by mouth 2 (two) times daily with a meal.   ONE TOUCH LANCETS Misc Use to test blood sugars twice daily DX: E 11.9   PROLINE PO Take by mouth daily.   traZODone 50 MG tablet Commonly known as:  DESYREL Take 25 mg by mouth at bedtime as needed.   vitamin C 1000  MG tablet Take 1,000 mg by mouth daily.   Zoster Vaccine Adjuvanted injection Commonly known as:  SHINGRIX Inject 0.5 mLs into the muscle once.        Review of Systems:  Review of Systems  Unable to perform ROS: Psychiatric disorder    Physical Exam: Vitals:   01/05/18 1508  BP: (!) 148/82  Pulse: (!) 118  Temp: 97.8 F (36.6 C)  TempSrc: Oral  SpO2: 97%  Weight: 195 lb 12.8 oz (88.8 kg)  Height: 5\' 3"  (1.6 m)   Body mass index is 34.68 kg/m. Physical Exam  Constitutional: She appears well-developed and well-nourished. No distress.  HENT:  Head: Normocephalic and atraumatic.  Right Ear: Hearing, tympanic membrane, external ear and ear canal normal.  Left Ear: Hearing, tympanic membrane, external ear and ear canal normal.  Mouth/Throat: Uvula is midline, oropharynx is clear and moist and mucous membranes are normal. She does not have dentures.  Eyes: Pupils are equal, round, and reactive to light. Conjunctivae, EOM and lids are normal. No scleral icterus.  Neck: Trachea normal and normal range of motion. Neck supple. Carotid bruit is not present. No thyroid mass and no thyromegaly present.  Cardiovascular: Normal rate, regular rhythm, normal heart sounds and intact distal pulses. Exam reveals no gallop and no friction rub.  No murmur  heard. No LE edema b/l. No calf TTP.   Pulmonary/Chest: Effort normal and breath sounds normal. She has no wheezes. She has no rhonchi. She has no rales. She exhibits no mass. Right breast exhibits no inverted nipple, no mass, no nipple discharge, no skin change and no tenderness. Left breast exhibits no inverted nipple, no mass, no nipple discharge, no skin change and no tenderness. Breasts are symmetrical.  Abdominal: Soft. Normal appearance, normal aorta and bowel sounds are normal. She exhibits no distension, no pulsatile midline mass and no mass. There is no hepatosplenomegaly. There is no tenderness. There is no rigidity, no rebound and no guarding. No hernia.  obese  Musculoskeletal: Normal range of motion. She exhibits no deformity.  Lymphadenopathy:       Head (right side): No posterior auricular adenopathy present.       Head (left side): No posterior auricular adenopathy present.    She has no cervical adenopathy.       Right: No supraclavicular adenopathy present.       Left: No supraclavicular adenopathy present.  Neurological: She is alert. She has normal strength and normal reflexes. No cranial nerve deficit. Gait normal.  Skin: Skin is warm, dry and intact. No rash noted. Nails show no clubbing.  Multiple yellow bruises, NT, both breasts, LE  Psychiatric: She has a normal mood and affect. Her speech is normal and behavior is normal. Thought content normal. Cognition and memory are normal.   Diabetic Foot Exam - Simple   Simple Foot Form Diabetic Foot exam was performed with the following findings:  Yes 01/05/2018  3:42 PM  Visual Inspection See comments:  Yes Sensation Testing Intact to touch and monofilament testing bilaterally:  Yes Pulse Check Posterior Tibialis and Dorsalis pulse intact bilaterally:  Yes Comments Right plantar callus on 1st toe; no d/c or bleeding; no ulcerations      Labs reviewed:  Basic Metabolic Panel: Recent Labs    06/22/17 1348  09/15/17 0945 01/04/18 1025  NA 140 140 139  K 4.5 4.6 4.2  CL 106 103 102  CO2 26 26 27   GLUCOSE 208* 170* 131*  BUN 25 24 20  CREATININE 0.98 1.04* 1.01*  CALCIUM 9.2 9.9 9.5  TSH  --  3.34  --    Liver Function Tests: Recent Labs    06/22/17 1348 09/15/17 0945 01/04/18 1025  AST 14 15 16   ALT 13 17 17   BILITOT 0.3 0.4 0.4  PROT 6.4 6.7 6.8   No results for input(s): LIPASE, AMYLASE in the last 8760 hours. No results for input(s): AMMONIA in the last 8760 hours. CBC: Recent Labs    01/04/18 1025  WBC 5.4  NEUTROABS 3,397  HGB 14.1  HCT 42.1  MCV 89.6  PLT 237   Lipid Panel: Recent Labs    09/15/17 0945 01/04/18 1025  CHOL 253* 261*  HDL 48* 57  LDLCALC 168* 168*  TRIG 209* 202*  CHOLHDL 5.3* 4.6   Lab Results  Component Value Date   HGBA1C 6.6 (H) 01/04/2018    Procedures: No results found. ECG OBTAINED AND REVIEWED BY MYSELF: NSR @ 96 bpm, LAD, LAE, poor R wave progression. No acute ischemic changes. No other ECG available to compare.  Assessment/Plan   ICD-10-CM   1. Hyperlipidemia associated with type 2 diabetes mellitus ([place]) E11.69    E78.5   2. High risk medication use Z79.899 EKG 12-Lead  3. Elevated BP without diagnosis of hypertension R03.0 EKG 12-Lead  4. Type 2 diabetes mellitus with other specified complication, without long-term current use of insulin ([facility]) E11.69   5. Bipolar I disorder ([place]) F31.9   6. Chronic pain syndrome G89.4   7. Diarrhea, unspecified type R19.7     RECOMMEND YOU START ZETIA OR REPATHA (INJECTABLE AGENT) TO HELP LOWER YOUR CHOLESTEROL  Continue current medications as ordered  Will call with stool results  Follow up with Dr Casimiro Needle as scheduled  Follow up in 3 mos with Janett Billow for routine visit.  Fasting labs prior to visit.  Keeping You Healthy handout given    Cordella Register. Perlie Gold  [facility] [address] [place], [place]  [address] [phone] Cell (Monday-Friday 8 AM - 5 PM) [phone] After 5 PM and follow prompts

## 2018-01-05 NOTE — Patient Instructions (Addendum)
RECOMMEND YOU START ZETIA OR REPATHA (INJECTABLE AGENT) TO HELP LOWER YOUR CHOLESTEROL  Continue current medications as ordered  Will call with stool results  Follow up with Dr Casimiro Needle as scheduled  Follow up in 3 mos with Janett Billow for routine visit.  Fasting labs prior to visit.  Keeping You Healthy  Get These Tests  Blood Pressure- Have your blood pressure checked by your healthcare provider at least once a year.  Normal blood pressure is 120/80.  Weight- Have your body mass index (BMI) calculated to screen for obesity.  BMI is a measure of body fat based on height and weight.  You can calculate your own BMI at [employer]  Cholesterol- Have your cholesterol checked every year.  Diabetes- Have your blood sugar checked every year if you have high blood pressure, high cholesterol, a family history of diabetes or if you are overweight.  Pap Test - Have a pap test every 1 to 5 years if you have been sexually active.  If you are older than 65 and recent pap tests have been normal you may not need additional pap tests.  In addition, if you have had a hysterectomy  for benign disease additional pap tests are not necessary.  Mammogram-Yearly mammograms are essential for early detection of breast cancer  Screening for Colon Cancer- Colonoscopy starting at age 74. Screening may begin sooner depending on your family history and other health conditions.  Follow up colonoscopy as directed by your Gastroenterologist.  Screening for Osteoporosis- Screening begins at age 27 with bone density scanning, sooner if you are at higher risk for developing Osteoporosis.  Get these medicines  Calcium with Vitamin D- Your body requires 1200-1500 mg of Calcium a day and [id number] IU of Vitamin D a day.  You can only absorb 500 mg of Calcium at a time therefore Calcium must be taken in 2 or 3 separate doses throughout the day.  Hormones- Hormone therapy has been associated with increased risk for  certain cancers and heart disease.  Talk to your healthcare provider about if you need relief from menopausal symptoms.  Aspirin- Ask your healthcare provider about taking Aspirin to prevent Heart Disease and Stroke.  Get these Immuniztions  Flu shot- Every fall  Pneumonia shot- Once after the age of 40; if you are younger ask your healthcare provider if you need a pneumonia shot.  Tetanus- Every ten years.  Zostavax- Once after the age of 29 to prevent shingles.  Take these steps  Don't smoke- Your healthcare provider can help you quit. For tips on how to quit, ask your healthcare provider or go to www.smokefree.gov or call 1-800 QUIT-NOW.  Be physically active- Exercise 5 days a week for a minimum of 30 minutes.  If you are not already physically active, start slow and gradually work up to 30 minutes of moderate physical activity.  Try walking, dancing, bike riding, swimming, etc.  Eat a healthy diet- Eat a variety of healthy foods such as fruits, vegetables, whole grains, low fat milk, low fat cheeses, yogurt, lean meats, chicken, fish, eggs, dried beans, tofu, etc.  For more information go to www.thenutritionsource.org  Dental visit- Brush and floss teeth twice daily; visit your dentist twice a year.  Eye exam- Visit your Optometrist or Ophthalmologist yearly.  Drink alcohol in moderation- Limit alcohol intake to one drink or less a day.  Never drink and drive.  Depression- Your emotional health is as important as your physical health.  If you're feeling down or  losing interest in things you normally enjoy, please talk to your healthcare provider.  Seat Belts- can save your life; always wear one  Smoke/Carbon Monoxide detectors- These detectors need to be installed on the appropriate level of your home.  Replace batteries at least once a year.  Violence- If anyone is threatening or hurting you, please tell your healthcare provider.  Living Will/ Health care power of attorney-  Discuss with your healthcare provider and family.

## 2018-01-06 ENCOUNTER — Encounter: Payer: Self-pay | Admitting: Internal Medicine

## 2018-01-06 NOTE — Addendum Note (Signed)
Addended by: Gildardo Cranker on: 01/06/2018 03:11 PM   Modules accepted: Level of Service

## 2018-01-11 LAB — STOOL CULTURE
MICRO NUMBER: 91210639
MICRO NUMBER: 91210640
MICRO NUMBER: 91210641
SHIGA RESULT: NOT DETECTED
SPECIMEN QUALITY: ADEQUATE
SPECIMEN QUALITY: ADEQUATE
SPECIMEN QUALITY:: ADEQUATE

## 2018-01-11 LAB — FECAL LACTOFERRIN, QUANT: LACTOFERRIN, QL, STOOL: NEGATIVE

## 2018-01-11 LAB — CLOSTRIDIUM DIFFICILE CULTURE-FECAL

## 2018-01-11 LAB — OVA AND PARASITE EXAMINATION

## 2018-01-13 NOTE — Addendum Note (Signed)
Addended by: Gildardo Cranker on: 01/13/2018 03:06 PM   Modules accepted: Level of Service

## 2018-01-18 DIAGNOSIS — F3181 Bipolar II disorder: Secondary | ICD-10-CM | POA: Diagnosis not present

## 2018-02-03 DIAGNOSIS — M1711 Unilateral primary osteoarthritis, right knee: Secondary | ICD-10-CM | POA: Diagnosis not present

## 2018-02-04 ENCOUNTER — Telehealth: Payer: Self-pay | Admitting: *Deleted

## 2018-02-04 NOTE — Telephone Encounter (Signed)
This should improve over the next few days, can increase her metformin to 1000 mg in the am and continue 500 mg at night until blood sugars normalize

## 2018-02-04 NOTE — Telephone Encounter (Signed)
Patient called and stated that her blood sugar usually runs 110-150. Yesterday she had a Prednisone shot in her knee and last night her blood sugar was 306 and this morning it was 348. Patient is wanting to know if this will go back down. Wants to know if another medication has to be added to get it back down. Please Advise.

## 2018-02-05 NOTE — Telephone Encounter (Signed)
Patient notified and agreed.  

## 2018-02-12 DIAGNOSIS — F3181 Bipolar II disorder: Secondary | ICD-10-CM | POA: Diagnosis not present

## 2018-02-17 ENCOUNTER — Telehealth: Payer: Self-pay | Admitting: *Deleted

## 2018-02-17 DIAGNOSIS — Z96642 Presence of left artificial hip joint: Secondary | ICD-10-CM | POA: Diagnosis not present

## 2018-02-17 DIAGNOSIS — E1136 Type 2 diabetes mellitus with diabetic cataract: Secondary | ICD-10-CM | POA: Diagnosis not present

## 2018-02-17 DIAGNOSIS — M47812 Spondylosis without myelopathy or radiculopathy, cervical region: Secondary | ICD-10-CM | POA: Diagnosis not present

## 2018-02-17 DIAGNOSIS — M50321 Other cervical disc degeneration at C4-C5 level: Secondary | ICD-10-CM | POA: Diagnosis not present

## 2018-02-17 DIAGNOSIS — M50322 Other cervical disc degeneration at C5-C6 level: Secondary | ICD-10-CM | POA: Diagnosis not present

## 2018-02-17 DIAGNOSIS — Z7984 Long term (current) use of oral hypoglycemic drugs: Secondary | ICD-10-CM | POA: Diagnosis not present

## 2018-02-17 DIAGNOSIS — F3181 Bipolar II disorder: Secondary | ICD-10-CM | POA: Diagnosis not present

## 2018-02-17 DIAGNOSIS — M170 Bilateral primary osteoarthritis of knee: Secondary | ICD-10-CM | POA: Diagnosis not present

## 2018-02-17 DIAGNOSIS — G8929 Other chronic pain: Secondary | ICD-10-CM | POA: Diagnosis not present

## 2018-02-17 NOTE — Telephone Encounter (Signed)
Patient called and left message on Clinical intake stating that her blood pressure has been elevated the last couple of days and wanted to know if a medication can be prescribed or if she had to have an appointment.   Tried calling patient back, LMOM for patient that she would need an appointment for [place] to evaluated and to [facility]. Awaiting callback.

## 2018-02-18 ENCOUNTER — Encounter: Payer: Self-pay | Admitting: Internal Medicine

## 2018-02-18 ENCOUNTER — Ambulatory Visit (INDEPENDENT_AMBULATORY_CARE_PROVIDER_SITE_OTHER): Payer: Medicare HMO | Admitting: Internal Medicine

## 2018-02-18 VITALS — BP 160/80 | HR 100 | Temp 98.2°F | Ht 63.0 in | Wt 198.0 lb

## 2018-02-18 DIAGNOSIS — G43809 Other migraine, not intractable, without status migrainosus: Secondary | ICD-10-CM | POA: Diagnosis not present

## 2018-02-18 DIAGNOSIS — E1169 Type 2 diabetes mellitus with other specified complication: Secondary | ICD-10-CM

## 2018-02-18 DIAGNOSIS — E785 Hyperlipidemia, unspecified: Secondary | ICD-10-CM | POA: Diagnosis not present

## 2018-02-18 DIAGNOSIS — F310 Bipolar disorder, current episode hypomanic: Secondary | ICD-10-CM

## 2018-02-18 DIAGNOSIS — I10 Essential (primary) hypertension: Secondary | ICD-10-CM | POA: Diagnosis not present

## 2018-02-18 MED ORDER — LISINOPRIL 10 MG PO TABS: 10.0000 mg | ORAL_TABLET | Freq: Every day | ORAL | 3 refills | Status: DC

## 2018-02-18 MED ORDER — METFORMIN HCL 500 MG PO TABS: 500.0000 mg | ORAL_TABLET | Freq: Two times a day (BID) | ORAL | 3 refills | Status: DC

## 2018-02-18 NOTE — Patient Instructions (Signed)
Exercise heart rate 65 years 78-132 bpm;  maximum is 155 bpm  Let's get your blood pressure down with resuming lisinopril.  If you are still getting migraines, we'll start imitrex once that bp is down.

## 2018-02-18 NOTE — Progress Notes (Signed)
Location:  [facility] clinic Provider:  Eben Choinski L. Mariea Clonts, D.O., C.M.D.  Goals of Care:  Advanced Directives 09/30/2017  Does Patient Have a Medical Advance Directive? No  Would patient like information on creating a medical advance directive? Yes (MAU/Ambulatory/Procedural Areas - Information given)   Chief Complaint  Patient presents with  . Acute Visit    elevated blood pressure    HPI: Patient is a 67 y.o. female seen today for BP checks--bp has been high at all of her psychiatry appts, also at neurosurgery at [facility].    BP elevated and HR elevated.  She's not on any bp meds.   Has been getting shots in her knee.  She's had two since July.  It made it high, she thinks.  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    Anxiety level is very high.  She changed her psychiatrist and he looked at her long list of meds and said she was not depressed and had hypomania.  meds are being whittled down and anxiety seems to be worse in her opinion.  Doesn't really want more klonopin.    Her son is concerned she may need a sleep test.  He had some apneic episodes and apparently she "snores like someone sawing a log".  She's been waking up every 2 hours.  She moved abilify day to night after she was too sleepy taking it in the daytime. vivance and wellbutrin were stopped.  She's now seeing Evette Doffing "Izzy" Izediuno at [facility]. On [place]  [phone] phone number.   She's been having migraines.  Was on fioricet until menopause.  Then had fewer headaches. Discussed possible role of bp being high or withdrawal of her different psychiatric meds.  Toradol is a miracle for her in the ED.  Says in college that helped her.  She has cut down on her nsaids.  She says they were constipating her.  Only takes brand name motrin and gets at [facility] every 2 years--rarely uses now.  Only took when she fell down the stairs at one point.  Has not taken care of herself since her son died.   Still needs a new counselor.     Refuses to take cholesterol pills.  Says she followed Adela Lank Pauling's diet and supplement regimen and things were better.  She was overdoing sweets lately.  Used to go months w/o cakes or donuts.  Says she needs to go through her fridge.    She has done her cologuard.    Past Medical History:  Diagnosis Date  . Anxiety   . Bipolar 1 disorder ([place])   . Bulging of cervical intervertebral disc    C2  . Bulging of thoracic intervertebral disc    t4  . Depression   . Diabetes mellitus without complication ([place])   . Hyperlipidemia   . PTSD (post-traumatic stress disorder)     Past Surgical History:  Procedure Laterality Date  . CATARACT EXTRACTION Right 08/11/2017  . CATARACT EXTRACTION Left 09/01/2017  . EYE SURGERY    . HEMICOLECTOMY  1997  . JOINT REPLACEMENT  2013   hip    Allergies  Allergen Reactions  . Oxycodone     Migraines  . Statins     "makes my urine turn brown"    Outpatient Encounter Medications as of 02/18/2018  Medication Sig  . ARIPiprazole (ABILIFY) 10 MG tablet Take 10 mg by mouth daily.   . Ascorbic Acid (VITAMIN C) 1000 MG tablet Take 1,000 mg  by mouth daily.  . clonazePAM (KLONOPIN) 1 MG tablet 1 mg at night, 0.5-1 mg during the day if needed  . gabapentin (NEURONTIN) 300 MG capsule Take 1 capsule (300 mg total) by mouth 3 (three) times daily.  Marland Kitchen ibuprofen (ADVIL,MOTRIN) 600 MG tablet Take 1 tablet (600 mg total) by mouth every 6 (six) hours as needed.  . lamoTRIgine (LAMICTAL) 200 MG tablet Take 200 mg by mouth 2 (two) times daily.   . metFORMIN (GLUCOPHAGE) 500 MG tablet Take 1 tablet (500 mg total) by mouth 2 (two) times daily with a meal.  . ONE TOUCH LANCETS MISC Use to test blood sugars twice daily DX: E 11.9  . PROLINE PO Take by mouth daily.  . traZODone (DESYREL) 50 MG tablet Take 25 mg by mouth at bedtime as needed.   . [DISCONTINUED] Blood Glucose Calibration (ACCU-CHEK AVIVA) SOLN Use As Directed. Dx E11.9  . [DISCONTINUED] buPROPion  (WELLBUTRIN SR) 150 MG 12 hr tablet Take 300 mg by mouth daily.  . [DISCONTINUED] lisdexamfetamine (VYVANSE) 40 MG capsule Take 40 mg by mouth every morning.  . [DISCONTINUED] Zoster Vaccine Adjuvanted [facility]) injection Inject 0.5 mLs into the muscle once.   No facility-administered encounter medications on file as of 02/18/2018.     Review of Systems:  Review of Systems  Constitutional: Negative for chills and fever.  HENT: Negative for congestion.   Eyes: Negative for blurred vision.  Respiratory: Negative for shortness of breath.   Cardiovascular: Negative for chest pain, palpitations and leg swelling.  Gastrointestinal: Negative for abdominal pain and heartburn.  Genitourinary: Negative for dysuria.  Musculoskeletal: Positive for joint pain. Negative for falls.  Neurological: Negative for dizziness and loss of consciousness.  Psychiatric/Behavioral: Negative for memory loss. The patient is nervous/anxious and has insomnia.        Bipolar with hypomania    Health Maintenance  Topic Date Due  . COLONOSCOPY  05/01/1999  . MAMMOGRAM  12/03/2017  . OPHTHALMOLOGY EXAM  05/25/2018  . HEMOGLOBIN A1C  07/06/2018  . URINE MICROALBUMIN  09/16/2018  . FOOT EXAM  01/06/2019  . TETANUS/TDAP  09/28/2025  . INFLUENZA VACCINE  Completed  . DEXA SCAN  Completed  . Hepatitis C Screening  Completed  . PNA vac Low Risk Adult  Completed    Physical Exam: Vitals:   02/18/18 0940  BP: (!) 160/80  Pulse: 100  Temp: 98.2 F (36.8 C)  TempSrc: Oral  SpO2: 96%  Weight: 198 lb (89.8 kg)  Height: 5\' 3"  (1.6 m)   Body mass index is 35.07 kg/m. Physical Exam  Constitutional: She is oriented to person, place, and time. She appears well-developed and well-nourished. No distress.  HENT:  Head: Normocephalic and atraumatic.  Cardiovascular: Normal heart sounds and intact distal pulses.  Tachycardic, regular  Pulmonary/Chest: Effort normal and breath sounds normal. No respiratory distress.   Abdominal: Bowel sounds are normal.  Musculoskeletal: Normal range of motion.  Neurological: She is alert and oriented to person, place, and time.  Skin: Skin is warm and dry.  Psychiatric:  Tangential speech    Labs reviewed: Basic Metabolic Panel: Recent Labs    06/22/17 1348 09/15/17 0945 01/04/18 1025  NA 140 140 139  K 4.5 4.6 4.2  CL 106 103 102  CO2 26 26 27   GLUCOSE 208* 170* 131*  BUN 25 24 20   CREATININE 0.98 1.04* 1.01*  CALCIUM 9.2 9.9 9.5  TSH  --  3.34  --    Liver Function Tests:  Recent Labs    06/22/17 1348 09/15/17 0945 01/04/18 1025  AST 14 15 16   ALT 13 17 17   BILITOT 0.3 0.4 0.4  PROT 6.4 6.7 6.8   No results for input(s): LIPASE, AMYLASE in the last 8760 hours. No results for input(s): AMMONIA in the last 8760 hours. CBC: Recent Labs    01/04/18 1025  WBC 5.4  NEUTROABS 3,397  HGB 14.1  HCT 42.1  MCV 89.6  PLT 237   Lipid Panel: Recent Labs    09/15/17 0945 01/04/18 1025  CHOL 253* 261*  HDL 48* 57  LDLCALC 168* 168*  TRIG 209* 202*  CHOLHDL 5.3* 4.6   Lab Results  Component Value Date   HGBA1C 6.6 (H) 01/04/2018   Assessment/Plan 1. Bipolar affective disorder, current episode hypomanic ([place]) -following now with Dr. Altamese Zillah" --see hpi--he is adjusting her medications to treat her hypomania--she reports significant anxiety at this point  2. Essential hypertension - bp elevated, she agrees to restart her lisinopril which had helped in the past -also some role of arthritis pain, knee injections, motrin, weight gain, changes in meds and anxiety - lisinopril (PRINIVIL,ZESTRIL) 10 MG tablet; Take 1 tablet (10 mg total) by mouth daily.  Dispense: 90 tablet; Refill: 3  3. Type 2 diabetes mellitus with other specified complication, without long-term current use of insulin (HCC) - sugar is elevated too vs usual due to poor dietary choices - lisinopril (PRINIVIL,ZESTRIL) 10 MG tablet; Take 1 tablet (10 mg total) by mouth daily.   Dispense: 90 tablet; Refill: 3 - metFORMIN (GLUCOPHAGE) 500 MG tablet; Take 1 tablet (500 mg total) by mouth 2 (two) times daily with a meal.  Dispense: 180 tablet; Refill: 3  4. Hyperlipidemia associated with type 2 diabetes mellitus ([place]) -refuses cholesterol meds and LDL well above goal -she agrees to work on her diet  5. Other migraine without status migrainosus, not intractable -will get bp controlled before I feel comfortable adding imitrex   Labs/tests ordered:  No orders of the defined types were placed in this encounter.  Next appt:  03/04/2018 f/u bp, then will plan on 4 mos f/u with fasting labs before  Asanti Craigo L. Ethan Kasperski, D.O. [facility] [address], [place] [address] Cell Phone (Mon-Fri 8am-5pm):  [phone] On Call:  [phone] & follow prompts after 5pm & weekends Office Phone:  [phone] Office Fax:  [phone]

## 2018-03-03 ENCOUNTER — Telehealth: Payer: Self-pay

## 2018-03-03 NOTE — Telephone Encounter (Signed)
Patient called complaining of cold/upper respiratory/sinus symptoms  1. Fever? Yes, low-grade 99.1 2. Chills? Yes 3. Myalgias? Yes 4. How long have you had your symptoms? Since Monday 5. Are you coughing up mucus and if yes any discoloration? Yes, yellow 6. What have you done for your symptoms thus far? Mucinex Max, Tylenol and Ibuprofen 7. Have you been around anyone sick? Grandson   Patient canceled appointment tomorrow with Dr.Reed due to sickness. Patient is too weak to drive and would like recommendations. Please advise

## 2018-03-03 NOTE — Telephone Encounter (Signed)
Needs to be seen.  Can someone else bring her to the appt?  She needs a chest xray done to be sure she does not have pneumonia and some labs.

## 2018-03-03 NOTE — Telephone Encounter (Signed)
I called patient and informed her of Dr.Reed's response. Patient scheduled to see Janett Billow tomorrow at 1:30 pm. Patient will try to find transportation and if not will call to cancel appointment

## 2018-03-04 ENCOUNTER — Ambulatory Visit: Payer: Self-pay | Admitting: Nurse Practitioner

## 2018-03-04 ENCOUNTER — Ambulatory Visit: Payer: Self-pay | Admitting: Family

## 2018-03-04 ENCOUNTER — Ambulatory Visit (INDEPENDENT_AMBULATORY_CARE_PROVIDER_SITE_OTHER): Payer: Medicare HMO | Admitting: Family

## 2018-03-04 ENCOUNTER — Ambulatory Visit: Payer: Medicare HMO | Admitting: Internal Medicine

## 2018-03-04 ENCOUNTER — Encounter: Payer: Self-pay | Admitting: Family

## 2018-03-04 VITALS — BP 118/70 | HR 90 | Temp 98.6°F | Resp 18 | Ht 63.0 in | Wt 200.0 lb

## 2018-03-04 DIAGNOSIS — G4483 Primary cough headache: Secondary | ICD-10-CM

## 2018-03-04 DIAGNOSIS — J0101 Acute recurrent maxillary sinusitis: Secondary | ICD-10-CM

## 2018-03-04 MED ORDER — DOXYCYCLINE HYCLATE 100 MG PO TABS: 100.0000 mg | ORAL_TABLET | Freq: Two times a day (BID) | ORAL | 0 refills | Status: AC

## 2018-03-04 MED ORDER — LORATADINE 10 MG PO TABS: 10.0000 mg | ORAL_TABLET | Freq: Every day | ORAL | 11 refills | Status: DC

## 2018-03-04 MED ORDER — SACCHAROMYCES BOULARDII 250 MG PO CAPS: 250.0000 mg | ORAL_CAPSULE | Freq: Two times a day (BID) | ORAL | 0 refills | Status: AC

## 2018-03-04 NOTE — Patient Instructions (Addendum)
1. Take Doxycycline 100 mg tablet one by mouth twice daily x 10 days please take along with probiotic Florastor 250 mg capsule one by mouth twice daily x 10 days to prevent antibiotic associated diarrhea  3. Drink plenty of fluid /soup  4. Notify provider if symptoms worsen or running fever > 100.5  5. May take over the counter Tylenol every 6 hours as needed for headache or fever.  Sinusitis, Adult Sinusitis is soreness and inflammation of your sinuses. Sinuses are hollow spaces in the bones around your face. They are located:  Around your eyes.  In the middle of your forehead.  Behind your nose.  In your cheekbones.  Your sinuses and nasal passages are lined with a stringy fluid (mucus). Mucus normally drains out of your sinuses. When your nasal tissues get inflamed or swollen, the mucus can get trapped or blocked so air cannot flow through your sinuses. This lets bacteria, viruses, and funguses grow, and that leads to infection. Follow these instructions at home: Medicines  Take, use, or apply over-the-counter and prescription medicines only as told by your doctor. These may include nasal sprays.  If you were prescribed an antibiotic medicine, take it as told by your doctor. Do not stop taking the antibiotic even if you start to feel better. Hydrate and Humidify  Drink enough water to keep your pee (urine) clear or pale yellow.  Use a cool mist humidifier to keep the humidity level in your home above 50%.  Breathe in steam for 10-15 minutes, 3-4 times a day or as told by your doctor. You can do this in the bathroom while a hot shower is running.  Try not to spend time in cool or dry air. Rest  Rest as much as possible.  Sleep with your head raised (elevated).  Make sure to get enough sleep each night. General instructions  Put a warm, moist washcloth on your face 3-4 times a day or as told by your doctor. This will help with discomfort.  Wash your hands often with  soap and water. If there is no soap and water, use hand sanitizer.  Do not smoke. Avoid being around people who are smoking (secondhand smoke).  Keep all follow-up visits as told by your doctor. This is important. Contact a doctor if:  You have a fever.  Your symptoms get worse.  Your symptoms do not get better within 10 days. Get help right away if:  You have a very bad headache.  You cannot stop throwing up (vomiting).  You have pain or swelling around your face or eyes.  You have trouble seeing.  You feel confused.  Your neck is stiff.  You have trouble breathing. This information is not intended to replace advice given to you by your health care provider. Make sure you discuss any questions you have with your health care provider. Document Released: 09/03/2007 Document Revised: 11/11/2015 Document Reviewed: 01/10/2015 Elsevier Interactive Patient Education  [employer].

## 2018-03-04 NOTE — Progress Notes (Signed)
Provider: Jeniah Kishi FNP-C  Lauree Chandler, NP  Patient Care Team: Lauree Chandler, NP as PCP - General (Geriatric Medicine) Brien Few, MD as Consulting Physician (Obstetrics and Gynecology) Katy Apo, MD as Consulting Physician (Ophthalmology)  Extended Emergency Contact Information Primary Emergency Contact: [place], [place] Phone: [phone] Relation: Son   Goals of care: Advanced Directive information Advanced Directives 09/30/2017  Does Patient Have a Medical Advance Directive? No  Would patient like information on creating a medical advance directive? Yes (MAU/Ambulatory/Procedural Areas - Information given)     Chief Complaint  Patient presents with  . Acute Visit    URI and High fall risk     HPI:  Pt is a 67 y.o. female seen today at [facility] office for an acute visit for evaluation of nasal congestion,cough,headache and fever x 3 days.she also states had loose stool but has resolved.She states Temp was 100.5 took some ibuprofen.she has taken mucinex for cough with much improvement today.she denies any nausea or vomiting.she has been around her grandson who had a cold.    Past Medical History:  Diagnosis Date  . Anxiety   . Bipolar 1 disorder ([place])   . Bulging of cervical intervertebral disc    C2  . Bulging of thoracic intervertebral disc    t4  . Depression   . Diabetes mellitus without complication ([place])   . Hyperlipidemia   . PTSD (post-traumatic stress disorder)    Past Surgical History:  Procedure Laterality Date  . CATARACT EXTRACTION Right 08/11/2017  . CATARACT EXTRACTION Left 09/01/2017  . EYE SURGERY    . HEMICOLECTOMY  1997  . JOINT REPLACEMENT  2013   hip    Allergies  Allergen Reactions  . Oxycodone     Migraines  . Statins     "makes my urine turn brown"    Outpatient Encounter Medications as of 03/04/2018  Medication Sig  . ARIPiprazole (ABILIFY) 10 MG  tablet Take 10 mg by mouth daily.   . Ascorbic Acid (VITAMIN C) 1000 MG tablet Take 1,000 mg by mouth daily.  . clonazePAM (KLONOPIN) 1 MG tablet 1 mg at night, 0.5-1 mg during the day if needed  . gabapentin (NEURONTIN) 300 MG capsule Take 1 capsule (300 mg total) by mouth 3 (three) times daily.  Marland Kitchen ibuprofen (ADVIL,MOTRIN) 600 MG tablet Take 1 tablet (600 mg total) by mouth every 6 (six) hours as needed.  . lamoTRIgine (LAMICTAL) 200 MG tablet Take 200 mg by mouth 2 (two) times daily.   Marland Kitchen lisinopril (PRINIVIL,ZESTRIL) 10 MG tablet Take 1 tablet (10 mg total) by mouth daily.  . metFORMIN (GLUCOPHAGE) 500 MG tablet Take 1 tablet (500 mg total) by mouth 2 (two) times daily with a meal.  . ONE TOUCH LANCETS MISC Use to test blood sugars twice daily DX: E 11.9  . PROLINE PO Take by mouth daily.  . traZODone (DESYREL) 50 MG tablet Take 25 mg by mouth at bedtime as needed.    No facility-administered encounter medications on file as of 03/04/2018.     Review of Systems  Constitutional: Negative for appetite change, chills and fatigue.       Had fever but none today.  HENT: Positive for congestion, rhinorrhea, sinus pressure and sinus pain. Negative for ear pain, facial swelling, postnasal drip, sneezing, sore throat and trouble swallowing.   Eyes: Negative for discharge, redness, itching and visual disturbance.  Respiratory: Positive for cough. Negative for chest tightness, shortness of breath and wheezing.   Cardiovascular: Negative for chest pain, palpitations and leg swelling.  Gastrointestinal: Negative for abdominal distention, abdominal pain, constipation, diarrhea, nausea and vomiting.  Genitourinary: Negative for dysuria, flank pain, frequency and urgency.  Skin: Negative for color change and pallor.  Neurological: Positive for headaches. Negative for dizziness and light-headedness.    Immunization History  Administered Date(s) Administered  . Influenza,inj,Quad PF,6+ Mos 04/04/2016    . Influenza-Unspecified 12/29/2013, 12/14/2017  . Pneumococcal Conjugate-13 01/04/2016  . Pneumococcal Polysaccharide-23 09/30/2017  . Tdap 09/29/2015  . Zoster Recombinat (Shingrix) 12/05/2017   Pertinent  Health Maintenance Due  Topic Date Due  . COLONOSCOPY  05/01/1999  . OPHTHALMOLOGY EXAM  05/25/2018  . HEMOGLOBIN A1C  07/06/2018  . FOOT EXAM  01/06/2019  . MAMMOGRAM  05/02/2019  . INFLUENZA VACCINE  Completed  . DEXA SCAN  Completed  . PNA vac Low Risk Adult  Completed   Fall Risk  03/04/2018 02/18/2018 12/30/2017 09/30/2017 06/22/2017  Falls in the past year? 1 1 Yes Yes Yes  Number falls in past yr: 1 1 2  or more 2 or more 2 or more  Injury with Fall? 1 0 No No Yes  Comment - - - - -    Vitals:   03/04/18 1414  BP: 118/70  Pulse: 90  Resp: 18  Temp: 98.6 F (37 C)  TempSrc: Oral  SpO2: 96%  Weight: 200 lb (90.7 kg)  Height: 5\' 3"  (1.6 m)   Body mass index is 35.43 kg/m. Physical Exam  Constitutional: She is oriented to person, place, and time. She appears well-developed and well-nourished. No distress.  HENT:  Head: Normocephalic.  Right Ear: External ear normal.  Left Ear: External ear normal.  Nose: Mucosal edema and rhinorrhea present. Right sinus exhibits maxillary sinus tenderness. Left sinus exhibits maxillary sinus tenderness.  Mouth/Throat: Oropharynx is clear and moist. No oropharyngeal exudate.  Eyes: Pupils are equal, round, and reactive to light. Conjunctivae and EOM are normal. Right eye exhibits no discharge. Left eye exhibits no discharge. No scleral icterus.  Neck: Normal range of motion. No JVD present. No thyromegaly present.  Cardiovascular: Normal rate, regular rhythm, normal heart sounds and intact distal pulses. Exam reveals no gallop and no friction rub.  No murmur heard. Pulmonary/Chest: Effort normal and breath sounds normal. No respiratory distress. She has no wheezes. She has no rales.  Abdominal: Soft. Bowel sounds are normal. She  exhibits no distension and no mass. There is no tenderness. There is no rebound and no guarding.  Musculoskeletal: Normal range of motion. She exhibits no edema or tenderness.  Lymphadenopathy:    She has no cervical adenopathy.  Neurological: She is oriented to person, place, and time.  Skin: Skin is warm and dry. No rash noted. No erythema. No pallor.  Psychiatric: She has a normal mood and affect. Her speech is normal and behavior is normal. Judgment and thought content normal.  Vitals reviewed.   Labs reviewed: Recent Labs    06/22/17 1348 09/15/17 0945 01/04/18 1025  NA 140 140 139  K 4.5 4.6 4.2  CL 106 103 102  CO2 26 26 27   GLUCOSE 208* 170* 131*  BUN 25 24 20   CREATININE 0.98 1.04* 1.01*  CALCIUM 9.2 9.9 9.5   Recent Labs    06/22/17 1348 09/15/17 0945 01/04/18 1025  AST 14 15 16   ALT 13 17 17   BILITOT 0.3 0.4 0.4  PROT  6.4 6.7 6.8   Recent Labs    01/04/18 1025  WBC 5.4  NEUTROABS 3,397  HGB 14.1  HCT 42.1  MCV 89.6  PLT 237   Lab Results  Component Value Date   TSH 3.34 09/15/2017   Lab Results  Component Value Date   HGBA1C 6.6 (H) 01/04/2018   Lab Results  Component Value Date   CHOL 261 (H) 01/04/2018   HDL 57 01/04/2018   LDLCALC 168 (H) 01/04/2018   TRIG 202 (H) 01/04/2018   CHOLHDL 4.6 01/04/2018    Significant Diagnostic Results in last 30 days:  No results found.  Assessment/Plan 1. Acute recurrent maxillary sinusitis Afebrile.nasal mucosa erythema no edema.maxillary tenderness to palpation.yellow nasal drainage noted.start loratadine 10 mg tablet daily x 14 days for nasal drainage.increase fluid intake/soup.start doxycycline 100 mg tablet twice daily x 10 days along with florastor 250 mg capsule twice daily x 10 days for antibiotics associated diarrhea prevention.    2. Primary cough headache Continue on OTC tylenol as needed for pain or fever.Notify provider's office if running temp > 100.5.Increase fluid intake/soup.    Family/ staff Communication: Reviewed plan of care with patient. Labs/tests ordered: None   Raghav Verrilli C Shamieka Gullo, NP

## 2018-03-08 DIAGNOSIS — F3181 Bipolar II disorder: Secondary | ICD-10-CM | POA: Diagnosis not present

## 2018-03-10 ENCOUNTER — Encounter: Payer: Self-pay | Admitting: Nurse Practitioner

## 2018-03-10 ENCOUNTER — Ambulatory Visit (INDEPENDENT_AMBULATORY_CARE_PROVIDER_SITE_OTHER): Payer: Medicare HMO | Admitting: Nurse Practitioner

## 2018-03-10 VITALS — BP 136/84 | HR 99 | Temp 98.2°F | Ht 63.0 in | Wt 201.1 lb

## 2018-03-10 DIAGNOSIS — I10 Essential (primary) hypertension: Secondary | ICD-10-CM

## 2018-03-10 DIAGNOSIS — R0683 Snoring: Secondary | ICD-10-CM | POA: Diagnosis not present

## 2018-03-10 NOTE — Patient Instructions (Addendum)
Continue lisinopril 10 mg by mouth daily for blood pressure   Per Dr Mariea Clonts Next appt:  4 mos f/u with fasting labs before  DASH Eating Plan DASH stands for "Dietary Approaches to Stop Hypertension." The DASH eating plan is a healthy eating plan that has been shown to reduce high blood pressure (hypertension). It may also reduce your risk for type 2 diabetes, heart disease, and stroke. The DASH eating plan may also help with weight loss. What are tips for following this plan? General guidelines  Avoid eating more than 2,300 mg (milligrams) of salt (sodium) a day. If you have hypertension, you may need to reduce your sodium intake to 1,500 mg a day.  Limit alcohol intake to no more than 1 drink a day for nonpregnant women and 2 drinks a day for men. One drink equals 12 oz of beer, 5 oz of wine, or 1 oz of hard liquor.  Work with your health care provider to maintain a healthy body weight or to lose weight. Ask what an ideal weight is for you.  Get at least 30 minutes of exercise that causes your heart to beat faster (aerobic exercise) most days of the week. Activities may include walking, swimming, or biking.  Work with your health care provider or diet and nutrition specialist (dietitian) to adjust your eating plan to your individual calorie needs. Reading food labels  Check food labels for the amount of sodium per serving. Choose foods with less than 5 percent of the Daily Value of sodium. Generally, foods with less than 300 mg of sodium per serving fit into this eating plan.  To find whole grains, look for the word "whole" as the first word in the ingredient list. Shopping  Buy products labeled as "low-sodium" or "no salt added."  Buy fresh foods. Avoid canned foods and premade or frozen meals. Cooking  Avoid adding salt when cooking. Use salt-free seasonings or herbs instead of table salt or sea salt. Check with your health care provider or pharmacist before using salt  substitutes.  Do not fry foods. Cook foods using healthy methods such as baking, boiling, grilling, and broiling instead.  Cook with heart-healthy oils, such as olive, canola, soybean, or sunflower oil. Meal planning   Eat a balanced diet that includes: ? 5 or more servings of fruits and vegetables each day. At each meal, try to fill half of your plate with fruits and vegetables. ? Up to 6-8 servings of whole grains each day. ? Less than 6 oz of lean meat, poultry, or fish each day. A 3-oz serving of meat is about the same size as a deck of cards. One egg equals 1 oz. ? 2 servings of low-fat dairy each day. ? A serving of nuts, seeds, or beans 5 times each week. ? Heart-healthy fats. Healthy fats called Omega-3 fatty acids are found in foods such as flaxseeds and coldwater fish, like sardines, salmon, and mackerel.  Limit how much you eat of the following: ? Canned or prepackaged foods. ? Food that is high in trans fat, such as fried foods. ? Food that is high in saturated fat, such as fatty meat. ? Sweets, desserts, sugary drinks, and other foods with added sugar. ? Full-fat dairy products.  Do not salt foods before eating.  Try to eat at least 2 vegetarian meals each week.  Eat more home-cooked food and less restaurant, buffet, and fast food.  When eating at a restaurant, ask that your food be prepared with  less salt or no salt, if possible. What foods are recommended? The items listed may not be a complete list. Talk with your dietitian about what dietary choices are best for you. Grains Whole-grain or whole-wheat bread. Whole-grain or whole-wheat pasta. Brown rice. [place] Morrow. Bulgur. Whole-grain and low-sodium cereals. Pita bread. Low-fat, low-sodium crackers. Whole-wheat flour tortillas. Vegetables Fresh or frozen vegetables (raw, steamed, roasted, or grilled). Low-sodium or reduced-sodium tomato and vegetable juice. Low-sodium or reduced-sodium tomato sauce and tomato  paste. Low-sodium or reduced-sodium canned vegetables. Fruits All fresh, dried, or frozen fruit. Canned fruit in natural juice (without added sugar). Meat and other protein foods Skinless chicken or [place]. Ground chicken or [place]. Pork with fat trimmed off. Fish and seafood. Egg whites. Dried beans, peas, or lentils. Unsalted nuts, nut butters, and seeds. Unsalted canned beans. Lean cuts of beef with fat trimmed off. Low-sodium, lean deli meat. Dairy Low-fat (1%) or fat-free (skim) milk. Fat-free, low-fat, or reduced-fat cheeses. Nonfat, low-sodium ricotta or cottage cheese. Low-fat or nonfat yogurt. Low-fat, low-sodium cheese. Fats and oils Soft margarine without trans fats. Vegetable oil. Low-fat, reduced-fat, or light mayonnaise and salad dressings (reduced-sodium). Canola, safflower, olive, soybean, and sunflower oils. Avocado. Seasoning and other foods Herbs. Spices. Seasoning mixes without salt. Unsalted popcorn and pretzels. Fat-free sweets. What foods are not recommended? The items listed may not be a complete list. Talk with your dietitian about what dietary choices are best for you. Grains Baked goods made with fat, such as croissants, muffins, or some breads. Dry pasta or rice meal packs. Vegetables Creamed or fried vegetables. Vegetables in a cheese sauce. Regular canned vegetables (not low-sodium or reduced-sodium). Regular canned tomato sauce and paste (not low-sodium or reduced-sodium). Regular tomato and vegetable juice (not low-sodium or reduced-sodium). Angie Fava. Olives. Fruits Canned fruit in a light or heavy syrup. Fried fruit. Fruit in cream or butter sauce. Meat and other protein foods Fatty cuts of meat. Ribs. Fried meat. Berniece Salines. Sausage. Bologna and other processed lunch meats. Salami. Fatback. Hotdogs. Bratwurst. Salted nuts and seeds. Canned beans with added salt. Canned or smoked fish. Whole eggs or egg yolks. Chicken or [place] with skin. Dairy Whole or 2% milk,  cream, and half-and-half. Whole or full-fat cream cheese. Whole-fat or sweetened yogurt. Full-fat cheese. Nondairy creamers. Whipped toppings. Processed cheese and cheese spreads. Fats and oils Butter. Stick margarine. Lard. Shortening. Ghee. Bacon fat. Tropical oils, such as coconut, palm kernel, or palm oil. Seasoning and other foods Salted popcorn and pretzels. Onion salt, garlic salt, seasoned salt, table salt, and sea salt. Worcestershire sauce. Tartar sauce. Barbecue sauce. Teriyaki sauce. Soy sauce, including reduced-sodium. Steak sauce. Canned and packaged gravies. Fish sauce. Oyster sauce. Cocktail sauce. Horseradish that you find on the shelf. Ketchup. Mustard. Meat flavorings and tenderizers. Bouillon cubes. Hot sauce and Tabasco sauce. Premade or packaged marinades. Premade or packaged taco seasonings. Relishes. Regular salad dressings. Where to find more information:  National Heart, Lung, and [place]: https://wilson-eaton.com/  American Heart Association: www.heart.org Summary  The DASH eating plan is a healthy eating plan that has been shown to reduce high blood pressure (hypertension). It may also reduce your risk for type 2 diabetes, heart disease, and stroke.  With the DASH eating plan, you should limit salt (sodium) intake to 2,300 mg a day. If you have hypertension, you may need to reduce your sodium intake to 1,500 mg a day.  When on the DASH eating plan, aim to eat more fresh fruits and vegetables, whole grains, lean proteins,  low-fat dairy, and heart-healthy fats.  Work with your health care provider or diet and nutrition specialist (dietitian) to adjust your eating plan to your individual calorie needs. This information is not intended to replace advice given to you by your health care provider. Make sure you discuss any questions you have with your health care provider. Document Released: 03/06/2011 Document Revised: 03/10/2016 Document Reviewed: 03/10/2016 Elsevier  Interactive Patient Education  [employer].

## 2018-03-10 NOTE — Progress Notes (Signed)
  Careteam: Patient Care Team: Eubanks, Jessica K, NP as PCP - General (Geriatric Medicine) Taavon, Richard, MD as Consulting Physician (Obstetrics and Gynecology) Lyles, Graham, MD as Consulting Physician (Ophthalmology)  Advanced Directive information Does Patient Have a Medical Advance Directive?: No  Allergies  Allergen Reactions  . Oxycodone     Migraines  . Statins     "makes my urine turn brown"    Chief Complaint  Patient presents with  . Follow-up    Pt is being seen to follow up on blood pressure.      HPI: Patient is a 67 y.o. female seen in the office today to follow up blood pressure.  Blood pressure was elevated when she saw Dr Reed 02/18/18 and was started on lisinopril  Reports she is changing from Abilify tablets to injection which should help with blood pressure as well.  Takes blood pressure occasionally at home but has not recently.  Headaches better since blood pressure has gone down.  Son has OSA and she snores, reports some of the same symptoms as him, sleepy all day, constant fatigue. Reports she wakes up with shortness of breath.   Review of Systems:  Review of Systems  Constitutional: Negative for chills and fever.  HENT: Negative for congestion.   Eyes: Negative for blurred vision.  Respiratory: Negative for shortness of breath.   Cardiovascular: Negative for chest pain, palpitations and leg swelling.  Gastrointestinal: Negative for abdominal pain and heartburn.  Genitourinary: Negative for dysuria.  Musculoskeletal: Positive for joint pain. Negative for falls.  Neurological: Negative for dizziness and loss of consciousness.  Psychiatric/Behavioral: Negative for memory loss. The patient is nervous/anxious and has insomnia.        Bipolar with hypomania- following with psych   Past Medical History:  Diagnosis Date  . Anxiety   . Bipolar 1 disorder (HCC)   . Bulging of cervical intervertebral disc    C2  . Bulging of thoracic  intervertebral disc    t4  . Depression   . Diabetes mellitus without complication (HCC)   . Hyperlipidemia   . PTSD (post-traumatic stress disorder)    Past Surgical History:  Procedure Laterality Date  . CATARACT EXTRACTION Right 08/11/2017  . CATARACT EXTRACTION Left 09/01/2017  . EYE SURGERY    . HEMICOLECTOMY  1997  . JOINT REPLACEMENT  2013   hip   Social History:   reports that she has never smoked. She has never used smokeless tobacco. She reports that she does not drink alcohol or use drugs.  Family History  Problem Relation Age of Onset  . Ovarian cancer Mother   . Lymphoma Father   . Colon cancer Sister     Medications: Patient's Medications  New Prescriptions   No medications on file  Previous Medications   ARIPIPRAZOLE (ABILIFY) 10 MG TABLET    Take 10 mg by mouth daily.    ASCORBIC ACID (VITAMIN C) 1000 MG TABLET    Take 1,000 mg by mouth daily.   CLONAZEPAM (KLONOPIN) 1 MG TABLET    1 mg at night, 0.5-1 mg during the day if needed   DOXYCYCLINE (VIBRA-TABS) 100 MG TABLET    Take 1 tablet (100 mg total) by mouth 2 (two) times daily for 10 days.   GABAPENTIN (NEURONTIN) 300 MG CAPSULE    Take 1 capsule (300 mg total) by mouth 3 (three) times daily.   IBUPROFEN (ADVIL,MOTRIN) 600 MG TABLET    Take 1 tablet (600 mg total) by   mouth every 6 (six) hours as needed.   LAMOTRIGINE (LAMICTAL) 200 MG TABLET    Take 200 mg by mouth 2 (two) times daily.    LISINOPRIL (PRINIVIL,ZESTRIL) 10 MG TABLET    Take 1 tablet (10 mg total) by mouth daily.   LORATADINE (CLARITIN) 10 MG TABLET    Take 1 tablet (10 mg total) by mouth daily for 14 days.   METFORMIN (GLUCOPHAGE) 500 MG TABLET    Take 1 tablet (500 mg total) by mouth 2 (two) times daily with a meal.   ONE TOUCH LANCETS MISC    Use to test blood sugars twice daily DX: E 11.9   PROLINE PO    Take by mouth daily.   SACCHAROMYCES BOULARDII (FLORASTOR) 250 MG CAPSULE    Take 1 capsule (250 mg total) by mouth 2 (two) times daily  for 10 days.   TRAZODONE (DESYREL) 50 MG TABLET    Take 25 mg by mouth at bedtime as needed.   Modified Medications   No medications on file  Discontinued Medications   No medications on file     Physical Exam:  Vitals:   03/10/18 1318  BP: 136/84  Pulse: 99  Temp: 98.2 F (36.8 C)  TempSrc: Oral  SpO2: 98%  Weight: 201 lb 1.6 oz (91.2 kg)  Height: 5' 3" (1.6 m)   Body mass index is 35.62 kg/m.  Physical Exam  Constitutional: She appears well-developed and well-nourished. No distress.  HENT:  Head: Normocephalic and atraumatic.  Mouth/Throat: Oropharynx is clear and moist. No oropharyngeal exudate.  Eyes: Pupils are equal, round, and reactive to light. Conjunctivae are normal.  Cardiovascular: Normal rate, regular rhythm and normal heart sounds.  Pulmonary/Chest: Effort normal and breath sounds normal.  Musculoskeletal: She exhibits no edema or tenderness.  Neurological: She is alert.  Skin: Skin is warm and dry. She is not diaphoretic.   Labs reviewed: Basic Metabolic Panel: Recent Labs    06/22/17 1348 09/15/17 0945 01/04/18 1025  NA 140 140 139  K 4.5 4.6 4.2  CL 106 103 102  CO2 26 26 27  GLUCOSE 208* 170* 131*  BUN 25 24 20  CREATININE 0.98 1.04* 1.01*  CALCIUM 9.2 9.9 9.5  TSH  --  3.34  --    Liver Function Tests: Recent Labs    06/22/17 1348 09/15/17 0945 01/04/18 1025  AST 14 15 16  ALT 13 17 17  BILITOT 0.3 0.4 0.4  PROT 6.4 6.7 6.8   No results for input(s): LIPASE, AMYLASE in the last 8760 hours. No results for input(s): AMMONIA in the last 8760 hours. CBC: Recent Labs    01/04/18 1025  WBC 5.4  NEUTROABS 3,397  HGB 14.1  HCT 42.1  MCV 89.6  PLT 237   Lipid Panel: Recent Labs    09/15/17 0945 01/04/18 1025  CHOL 253* 261*  HDL 48* 57  LDLCALC 168* 168*  TRIG 209* 202*  CHOLHDL 5.3* 4.6   TSH: Recent Labs    09/15/17 0945  TSH 3.34   A1C: Lab Results  Component Value Date   HGBA1C 6.6 (H) 01/04/2018      Assessment/Plan 1. Essential hypertension -improved on lisinopril. To continue at this time with DASH diet which was given at appt - BMP with eGFR([facility])  2. Snoring -pt would like to be evaluated by sleep study due to several symptoms that she has that are consist with OSA - Ambulatory referral to Pulmonology for sleep study  Next   appt: 3 months with labs before Woodley Petzold K. [place], [facility] [phone]

## 2018-03-11 LAB — BASIC METABOLIC PANEL WITH GFR
BUN: 20 mg/dL (ref 7–25)
CO2: 25 mmol/L (ref 20–32)
Calcium: 9.2 mg/dL (ref 8.6–10.4)
Chloride: 105 mmol/L (ref 98–110)
Creat: 0.96 mg/dL (ref 0.50–0.99)
GFR, Est African American: 70 mL/min/{1.73_m2} (ref >=60)
GFR, Est Non African American: 61 mL/min/{1.73_m2} (ref >=60)
Glucose, Bld: 225 mg/dL — ABNORMAL HIGH (ref 65–139)
POTASSIUM: 4.7 mmol/L (ref 3.5–5.3)
SODIUM: 139 mmol/L (ref 135–146)

## 2018-04-02 ENCOUNTER — Other Ambulatory Visit: Payer: Self-pay | Admitting: *Deleted

## 2018-04-02 DIAGNOSIS — Z79899 Other long term (current) drug therapy: Secondary | ICD-10-CM

## 2018-04-02 DIAGNOSIS — E785 Hyperlipidemia, unspecified: Secondary | ICD-10-CM

## 2018-04-02 DIAGNOSIS — I10 Essential (primary) hypertension: Secondary | ICD-10-CM

## 2018-04-02 DIAGNOSIS — E1169 Type 2 diabetes mellitus with other specified complication: Secondary | ICD-10-CM

## 2018-04-05 ENCOUNTER — Telehealth: Payer: Self-pay | Admitting: *Deleted

## 2018-04-05 NOTE — Telephone Encounter (Signed)
Patient called wanting to know the status of Sleep Study referral placed in December.   Also stated that she completed the Cologuard but her Cologuard Order has ran out and need an updated one faxed to them.  Faxed Updated order.

## 2018-04-06 DIAGNOSIS — F3181 Bipolar II disorder: Secondary | ICD-10-CM | POA: Diagnosis not present

## 2018-04-06 NOTE — Telephone Encounter (Signed)
Referral has been placed, Lattie Haw can you comment on the status of this?

## 2018-04-07 ENCOUNTER — Other Ambulatory Visit: Payer: Medicare HMO

## 2018-04-07 DIAGNOSIS — E785 Hyperlipidemia, unspecified: Secondary | ICD-10-CM | POA: Diagnosis not present

## 2018-04-07 DIAGNOSIS — I10 Essential (primary) hypertension: Secondary | ICD-10-CM | POA: Diagnosis not present

## 2018-04-07 DIAGNOSIS — Z79899 Other long term (current) drug therapy: Secondary | ICD-10-CM | POA: Diagnosis not present

## 2018-04-07 DIAGNOSIS — E1169 Type 2 diabetes mellitus with other specified complication: Secondary | ICD-10-CM | POA: Diagnosis not present

## 2018-04-07 NOTE — Telephone Encounter (Signed)
Called & gave pt # to call [facility] to make appt. [phone].  Thanks, Vilinda Blanks.

## 2018-04-08 ENCOUNTER — Encounter: Payer: Self-pay | Admitting: Nurse Practitioner

## 2018-04-08 ENCOUNTER — Ambulatory Visit (INDEPENDENT_AMBULATORY_CARE_PROVIDER_SITE_OTHER): Payer: Medicare HMO | Admitting: Nurse Practitioner

## 2018-04-08 VITALS — BP 138/80 | HR 96 | Temp 97.7°F | Ht 63.0 in | Wt 202.6 lb

## 2018-04-08 DIAGNOSIS — G894 Chronic pain syndrome: Secondary | ICD-10-CM | POA: Diagnosis not present

## 2018-04-08 DIAGNOSIS — E1169 Type 2 diabetes mellitus with other specified complication: Secondary | ICD-10-CM | POA: Diagnosis not present

## 2018-04-08 DIAGNOSIS — I10 Essential (primary) hypertension: Secondary | ICD-10-CM | POA: Diagnosis not present

## 2018-04-08 DIAGNOSIS — E875 Hyperkalemia: Secondary | ICD-10-CM

## 2018-04-08 DIAGNOSIS — Z6836 Body mass index (BMI) 36.0-36.9, adult: Secondary | ICD-10-CM | POA: Diagnosis not present

## 2018-04-08 DIAGNOSIS — Z6835 Body mass index (BMI) 35.0-35.9, adult: Secondary | ICD-10-CM | POA: Diagnosis not present

## 2018-04-08 DIAGNOSIS — E785 Hyperlipidemia, unspecified: Secondary | ICD-10-CM

## 2018-04-08 LAB — COMPREHENSIVE METABOLIC PANEL
AG Ratio: 1.8 (calc) (ref 1.0–2.5)
ALT: 21 U/L (ref 6–29)
AST: 18 U/L (ref 10–35)
Albumin: 4.4 g/dL (ref 3.6–5.1)
Alkaline phosphatase (APISO): 85 U/L (ref 33–130)
BUN: 20 mg/dL (ref 7–25)
CO2: 22 mmol/L (ref 20–32)
Calcium: 9.6 mg/dL (ref 8.6–10.4)
Chloride: 104 mmol/L (ref 98–110)
Creat: 0.89 mg/dL (ref 0.50–0.99)
GLUCOSE: 151 mg/dL — AB (ref 65–99)
Globulin: 2.4 g/dL (calc) (ref 1.9–3.7)
Potassium: 5.4 mmol/L — ABNORMAL HIGH (ref 3.5–5.3)
Sodium: 136 mmol/L (ref 135–146)
Total Bilirubin: 0.4 mg/dL (ref 0.2–1.2)
Total Protein: 6.8 g/dL (ref 6.1–8.1)

## 2018-04-08 LAB — LIPID PANEL
Cholesterol: 290 mg/dL — ABNORMAL HIGH (ref <200)
HDL: 55 mg/dL (ref >50)
LDL Cholesterol (Calc): 189 mg/dL (calc) — ABNORMAL HIGH
Non-HDL Cholesterol (Calc): 235 mg/dL (calc) — ABNORMAL HIGH (ref <130)
TRIGLYCERIDES: 246 mg/dL — AB (ref <150)
Total CHOL/HDL Ratio: 5.3 (calc) — ABNORMAL HIGH (ref <5.0)

## 2018-04-08 LAB — CBC WITH DIFFERENTIAL/PLATELET
Absolute Monocytes: 270 cells/uL (ref 200–950)
Basophils Absolute: 10 cells/uL (ref 0–200)
Basophils Relative: 0.2 %
Eosinophils Absolute: 10 cells/uL — ABNORMAL LOW (ref 15–500)
Eosinophils Relative: 0.2 %
HCT: 40.4 % (ref 35.0–45.0)
Hemoglobin: 13.8 g/dL (ref 11.7–15.5)
Lymphs Abs: 1051 cells/uL (ref 850–3900)
MCH: 30.1 pg (ref 27.0–33.0)
MCHC: 34.2 g/dL (ref 32.0–36.0)
MCV: 88.2 fL (ref 80.0–100.0)
MPV: 11 fL (ref 7.5–12.5)
Monocytes Relative: 5.3 %
Neutro Abs: 3759 cells/uL (ref 1500–7800)
Neutrophils Relative %: 73.7 %
PLATELETS: 221 10*3/uL (ref 140–400)
RBC: 4.58 10*6/uL (ref 3.80–5.10)
RDW: 12.5 % (ref 11.0–15.0)
Total Lymphocyte: 20.6 %
WBC: 5.1 10*3/uL (ref 3.8–10.8)

## 2018-04-08 LAB — HEMOGLOBIN A1C
HEMOGLOBIN A1C: 7.3 %{Hb} — AB (ref <5.7)
Mean Plasma Glucose: 163 (calc)
eAG (mmol/L): 9 (calc)

## 2018-04-08 MED ORDER — GABAPENTIN 300 MG PO CAPS: 300.0000 mg | ORAL_CAPSULE | Freq: Three times a day (TID) | ORAL | 3 refills | Status: DC

## 2018-04-08 MED ORDER — LORATADINE 10 MG PO TABS: 10.0000 mg | ORAL_TABLET | Freq: Every day | ORAL | 11 refills | Status: DC

## 2018-04-08 NOTE — Progress Notes (Signed)
Careteam: Patient Care Team: Lauree Chandler, NP as PCP - General (Geriatric Medicine) Brien Few, MD as Consulting Physician (Obstetrics and Gynecology) Katy Apo, MD as Consulting Physician (Ophthalmology)  Advanced Directive information Does Patient Have a Medical Advance Directive?: No  Allergies  Allergen Reactions  . Oxycodone     Migraines  . Statins     "makes my urine turn brown"    Chief Complaint  Patient presents with  . Medical Management of Chronic Issues    3 month follow up     HPI: Patient is a 68 y.o. female seen in the office today routine follow up She is a poor historian due to psych d/o. Hx obtained from chart.  Lab results reviewed from yesterday.  Bipolar d/o/anxiety/insomnia/PTSD - MDD much better controlled. Has had changes in her medication- she is followed by psych "Dr Altamese " and had changes in medication which as dramatically changed her life.  She is taking wellbutrin sr, klonopin, neurontin, lamictal, and trazodone. She sees a therapist.   DM - she takes metformin 500 mg twice daily. A1c 6.6% prior and now 7.3% has had a poor diet but has made changes recently. Checking blood sugars at home and will notify if they do not improve. She has not had any numbness/tingling. Seeing eye doctor routinely was told no diabetic changes. She reports statin intolerance - urine turns brown  Hyperlipidemia- statin intolerant- urine turns brown. LDL is worse but does not want to start medication, would like dietary modifications for next 3 months and if not improved will start zetia which she has been told about in the past.   Cervical/lumbar herniated/bulging discs - C2-T4; she reports no significant pain at this time. She has never had sx. Initial injury was falling off porch with whiplash injury many years ago. She had an episode when her leg gave away when she went to get out of her car. Gabapentin helps pain.   Hyperkalemia- using a salt  substitute and using a lot of it- thought she was supposed to use a lot of it to make her blood pressure "low"  Was taking 1/2 tsp most days   Review of Systems:  Review of Systems  Constitutional: Negative for chills and fever.       Weight gain  HENT: Negative for congestion.   Eyes: Negative for blurred vision.  Respiratory: Negative for shortness of breath.   Cardiovascular: Negative for chest pain, palpitations and leg swelling.  Gastrointestinal: Negative for abdominal pain and heartburn.  Genitourinary: Negative for dysuria.  Musculoskeletal: Negative for falls and joint pain.  Neurological: Negative for dizziness and loss of consciousness.  Psychiatric/Behavioral: Negative for memory loss. The patient is nervous/anxious and has insomnia.        Bipolar with hypomania- following with psych-- mood much better with medication changes.    Past Medical History:  Diagnosis Date  . Anxiety   . Bipolar 1 disorder ([place])   . Bulging of cervical intervertebral disc    C2  . Bulging of thoracic intervertebral disc    t4  . Depression   . Diabetes mellitus without complication ([place])   . Hyperlipidemia   . PTSD (post-traumatic stress disorder)    Past Surgical History:  Procedure Laterality Date  . CATARACT EXTRACTION Right 08/11/2017  . CATARACT EXTRACTION Left 09/01/2017  . EYE SURGERY    . HEMICOLECTOMY  1997  . JOINT REPLACEMENT  2013   hip   Social History:   reports  that she has never smoked. She has never used smokeless tobacco. She reports that she does not drink alcohol or use drugs.  Family History  Problem Relation Age of Onset  . Ovarian cancer Mother   . Lymphoma Father   . Colon cancer Sister     Medications: Patient's Medications  New Prescriptions   No medications on file  Previous Medications   ARIPIPRAZOLE ER (ABILIFY MAINTENA) 300 MG PRSY PREFILLED SYRINGE    Inject 300 mg into the muscle every 30 (thirty) days. Last injection 04/06/2018   ASCORBIC  ACID (VITAMIN C) 1000 MG TABLET    Take 1,000 mg by mouth daily.   ASTAXANTHIN PO    Take 120 mg by mouth daily.    CLONAZEPAM (KLONOPIN) 1 MG TABLET    1 mg at night, 0.5-1 mg during the day if needed   GABAPENTIN (NEURONTIN) 300 MG CAPSULE    Take 1 capsule (300 mg total) by mouth 3 (three) times daily.   LAMOTRIGINE (LAMICTAL) 200 MG TABLET    Take 200 mg by mouth 2 (two) times daily.    LISINOPRIL (PRINIVIL,ZESTRIL) 10 MG TABLET    Take 1 tablet (10 mg total) by mouth daily.   LORATADINE (CLARITIN) 10 MG TABLET    Take 1 tablet (10 mg total) by mouth daily for 14 days.   LORATADINE (CLARITIN) 10 MG TABLET    Take 10 mg by mouth daily.   METFORMIN (GLUCOPHAGE) 500 MG TABLET    Take 1 tablet (500 mg total) by mouth 2 (two) times daily with a meal.   ONE TOUCH LANCETS MISC    Use to test blood sugars twice daily DX: E 11.9   PROLINE PO    Take by mouth daily.   TRAZODONE (DESYREL) 50 MG TABLET    Take 25 mg by mouth at bedtime as needed.   Modified Medications   No medications on file  Discontinued Medications   ARIPIPRAZOLE (ABILIFY) 10 MG TABLET    Take 10 mg by mouth daily.    IBUPROFEN (ADVIL,MOTRIN) 600 MG TABLET    Take 1 tablet (600 mg total) by mouth every 6 (six) hours as needed.     Physical Exam:  Vitals:   04/08/18 1438  BP: 138/80  Pulse: 96  Temp: 97.7 F (36.5 C)  TempSrc: Oral  SpO2: 97%  Weight: 202 lb 9.6 oz (91.9 kg)  Height: 5\' 3"  (1.6 m)   Body mass index is 35.89 kg/m.  Physical Exam Constitutional:      General: She is not in acute distress.    Appearance: She is well-developed. She is not diaphoretic.  HENT:     Head: Normocephalic and atraumatic.     Mouth/Throat:     Pharynx: No oropharyngeal exudate.  Eyes:     Conjunctiva/sclera: Conjunctivae normal.     Pupils: Pupils are equal, round, and reactive to light.  Cardiovascular:     Rate and Rhythm: Normal rate and regular rhythm.     Heart sounds: Normal heart sounds.  Pulmonary:      Effort: Pulmonary effort is normal.     Breath sounds: Normal breath sounds.  Musculoskeletal:        General: No tenderness.  Skin:    General: Skin is warm and dry.  Neurological:     Mental Status: She is alert.     Labs reviewed: Basic Metabolic Panel: Recent Labs    09/15/17 0945 01/04/18 1025 03/10/18 1407 04/07/18 1334  NA  140 139 139 136  K 4.6 4.2 4.7 5.4*  CL 103 102 105 104  CO2 26 27 25 22   GLUCOSE 170* 131* 225* 151*  BUN 24 20 20 20   CREATININE 1.04* 1.01* 0.96 0.89  CALCIUM 9.9 9.5 9.2 9.6  TSH 3.34  --   --   --    Liver Function Tests: Recent Labs    09/15/17 0945 01/04/18 1025 04/07/18 1334  AST 15 16 18   ALT 17 17 21   BILITOT 0.4 0.4 0.4  PROT 6.7 6.8 6.8   No results for input(s): LIPASE, AMYLASE in the last 8760 hours. No results for input(s): AMMONIA in the last 8760 hours. CBC: Recent Labs    01/04/18 1025 04/07/18 1334  WBC 5.4 5.1  NEUTROABS 3,397 3,759  HGB 14.1 13.8  HCT 42.1 40.4  MCV 89.6 88.2  PLT 237 221   Lipid Panel: Recent Labs    09/15/17 0945 01/04/18 1025 04/07/18 1334  CHOL 253* 261* 290*  HDL 48* 57 55  LDLCALC 168* 168* 189*  TRIG 209* 202* 246*  CHOLHDL 5.3* 4.6 5.3*   TSH: Recent Labs    09/15/17 0945  TSH 3.34   A1C: Lab Results  Component Value Date   HGBA1C 7.3 (H) 04/07/2018     Assessment/Plan 1. Chronic pain syndrome -without worsening pain. Stable on neurontin.  - gabapentin (NEURONTIN) 300 MG capsule; Take 1 capsule (300 mg total) by mouth 3 (three) times daily.  Dispense: 270 capsule; Refill: 3  2. Essential hypertension Improved at this time. Will continue current regimen  3. Body mass index (BMI) of 35.0-35.9 in adult Noted today  4. Class 2 severe obesity due to excess calories with serious comorbidity and body mass index (BMI) of 36.0 to 36.9 in adult [facility]) Discussed dietary and exercise modifications.   5. Type 2 diabetes mellitus with other specified complication,  without long-term current use of insulin (HCC) A1c elevated compared to last, will continue metformin and pt plans  to make dietary modifications. - Hemoglobin A1c; Future  6. Hyperlipidemia, unspecified hyperlipidemia type -discussed adding zetia, she is statin intolerance, she would like to work on diet modifications for the next 3 months, aware of risk of MI, CVA due to uncontrolled lipids.  - Lipid Panel; Future - COMPLETE METABOLIC PANEL WITH GFR; Future  7. Hyperkalemia K 5.4; using salt substitute as a potassium supplement, using excessively to every meal. Advised to stop this as now potassium is high. She is agreeable at this time.   Next appt: 3 months with fasting labs prior  Annagrace Carr K. [place], [facility] [phone]

## 2018-04-08 NOTE — Patient Instructions (Addendum)
Cut back on potassium supplement- your potassium is too high    Fat and Cholesterol Restricted Eating Plan Getting too much fat and cholesterol in your diet may cause health problems. Choosing the right foods helps keep your fat and cholesterol at normal levels. This can keep you from getting certain diseases. Your doctor may recommend an eating plan that includes:  Total fat: ______% or less of total calories a day.  Saturated fat: ______% or less of total calories a day.  Cholesterol: less than _________mg a day.  Fiber: ______g a day. What are tips for following this plan? Meal planning  At meals, divide your plate into four equal parts: ? Fill one-half of your plate with vegetables and green salads. ? Fill one-fourth of your plate with whole grains. ? Fill one-fourth of your plate with low-fat (lean) protein foods.  Eat fish that is high in omega-3 fats at least two times a week. This includes mackerel, tuna, sardines, and salmon.  Eat foods that are high in fiber, such as whole grains, beans, apples, broccoli, carrots, peas, and barley. General tips   Work with your doctor to lose weight if you need to.  Avoid: ? Foods with added sugar. ? Fried foods. ? Foods with partially hydrogenated oils.  Limit alcohol intake to no more than 1 drink a day for nonpregnant women and 2 drinks a day for men. One drink equals 12 oz of beer, 5 oz of wine, or 1 oz of hard liquor. Reading food labels  Check food labels for: ? Trans fats. ? Partially hydrogenated oils. ? Saturated fat (g) in each serving. ? Cholesterol (mg) in each serving. ? Fiber (g) in each serving.  Choose foods with healthy fats, such as: ? Monounsaturated fats. ? Polyunsaturated fats. ? Omega-3 fats.  Choose grain products that have whole grains. Look for the word "whole" as the first word in the ingredient list. Cooking  Cook foods using low-fat methods. These include baking, boiling, grilling, and  broiling.  Eat more home-cooked foods. Eat at restaurants and buffets less often.  Avoid cooking using saturated fats, such as butter, cream, palm oil, palm kernel oil, and coconut oil. Recommended foods  Fruits  All fresh, canned (in natural juice), or frozen fruits. Vegetables  Fresh or frozen vegetables (raw, steamed, roasted, or grilled). Green salads. Grains  Whole grains, such as whole wheat or whole grain breads, crackers, cereals, and pasta. Unsweetened oatmeal, bulgur, barley, quinoa, or brown rice. Corn or whole wheat flour tortillas. Meats and other protein foods  Ground beef (85% or leaner), grass-fed beef, or beef trimmed of fat. Skinless chicken or [place]. Ground chicken or [place]. Pork trimmed of fat. All fish and seafood. Egg whites. Dried beans, peas, or lentils. Unsalted nuts or seeds. Unsalted canned beans. Nut butters without added sugar or oil. Dairy  Low-fat or nonfat dairy products, such as skim or 1% milk, 2% or reduced-fat cheeses, low-fat and fat-free ricotta or cottage cheese, or plain low-fat and nonfat yogurt. Fats and oils  Tub margarine without trans fats. Light or reduced-fat mayonnaise and salad dressings. Avocado. Olive, canola, sesame, or safflower oils. The items listed above may not be a complete list of foods and beverages you can eat. Contact a dietitian for more information. Foods to avoid Fruits  Canned fruit in heavy syrup. Fruit in cream or butter sauce. Fried fruit. Vegetables  Vegetables cooked in cheese, cream, or butter sauce. Fried vegetables. Grains  White bread. White pasta. White rice.  Cornbread. Bagels, pastries, and croissants. Crackers and snack foods that contain trans fat and hydrogenated oils. Meats and other protein foods  Fatty cuts of meat. Ribs, chicken wings, bacon, sausage, bologna, salami, chitterlings, fatback, hot dogs, bratwurst, and packaged lunch meats. Liver and organ meats. Whole eggs and egg yolks. Chicken  and [place] with skin. Fried meat. Dairy  Whole or 2% milk, cream, half-and-half, and cream cheese. Whole milk cheeses. Whole-fat or sweetened yogurt. Full-fat cheeses. Nondairy creamers and whipped toppings. Processed cheese, cheese spreads, and cheese curds. Beverages  Alcohol. Sugar-sweetened drinks such as sodas, lemonade, and fruit drinks. Fats and oils  Butter, stick margarine, lard, shortening, ghee, or bacon fat. Coconut, palm kernel, and palm oils. Sweets and desserts  Corn syrup, sugars, honey, and molasses. Candy. Jam and jelly. Syrup. Sweetened cereals. Cookies, pies, cakes, donuts, muffins, and ice cream. The items listed above may not be a complete list of foods and beverages you should avoid. Contact a dietitian for more information. Summary  Choosing the right foods helps keep your fat and cholesterol at normal levels. This can keep you from getting certain diseases.  At meals, fill one-half of your plate with vegetables and green salads.  Eat high-fiber foods, like whole grains, beans, apples, carrots, peas, and barley.  Limit added sugar, saturated fats, alcohol, and fried foods. This information is not intended to replace advice given to you by your health care provider. Make sure you discuss any questions you have with your health care provider. Document Released: 09/16/2011 Document Revised: 11/18/2017 Document Reviewed: 12/02/2016 Elsevier Interactive Patient Education  2019 [employer].

## 2018-04-12 ENCOUNTER — Encounter: Payer: Self-pay | Admitting: Pulmonary Disease

## 2018-04-12 ENCOUNTER — Ambulatory Visit (INDEPENDENT_AMBULATORY_CARE_PROVIDER_SITE_OTHER): Payer: Medicare HMO | Admitting: Pulmonary Disease

## 2018-04-12 VITALS — BP 102/78 | HR 111 | Ht 63.0 in | Wt 202.0 lb

## 2018-04-12 DIAGNOSIS — G4719 Other hypersomnia: Secondary | ICD-10-CM | POA: Diagnosis not present

## 2018-04-12 DIAGNOSIS — R0683 Snoring: Secondary | ICD-10-CM

## 2018-04-12 NOTE — Patient Instructions (Signed)
Moderate probability of obstructive sleep apnea  We will schedule you for home sleep study   Sleep Apnea Sleep apnea is a condition in which breathing pauses or becomes shallow during sleep. Episodes of sleep apnea usually last 10 seconds or longer, and they may occur as many as 20 times an hour. Sleep apnea disrupts your sleep and keeps your body from getting the rest that it needs. This condition can increase your risk of certain health problems, including:  Heart attack.  Stroke.  Obesity.  Diabetes.  Heart failure.  Irregular heartbeat. There are three kinds of sleep apnea:  Obstructive sleep apnea. This kind is caused by a blocked or collapsed airway.  Central sleep apnea. This kind happens when the part of the brain that controls breathing does not send the correct signals to the muscles that control breathing.  Mixed sleep apnea. This is a combination of obstructive and central sleep apnea. What are the causes? The most common cause of this condition is a collapsed or blocked airway. An airway can collapse or become blocked if:  Your throat muscles are abnormally relaxed.  Your tongue and tonsils are larger than normal.  You are overweight.  Your airway is smaller than normal. What increases the risk? This condition is more likely to develop in people who:  Are overweight.  Smoke.  Have a smaller than normal airway.  Are elderly.  Are female.  Drink alcohol.  Take sedatives or tranquilizers.  Have a family history of sleep apnea. What are the signs or symptoms? Symptoms of this condition include:  Trouble staying asleep.  Daytime sleepiness and tiredness.  Irritability.  Loud snoring.  Morning headaches.  Trouble concentrating.  Forgetfulness.  Decreased interest in sex.  Unexplained sleepiness.  Mood swings.  Personality changes.  Feelings of depression.  Waking up often during the night to urinate.  Dry mouth.  Sore  throat. How is this diagnosed? This condition may be diagnosed with:  A medical history.  A physical exam.  A series of tests that are done while you are sleeping (sleep study). These tests are usually done in a sleep lab, but they may also be done at home. How is this treated? Treatment for this condition aims to restore normal breathing and to ease symptoms during sleep. It may involve managing health issues that can affect breathing, such as high blood pressure or obesity. Treatment may include:  Sleeping on your side.  Using a decongestant if you have nasal congestion.  Avoiding the use of depressants, including alcohol, sedatives, and narcotics.  Losing weight if you are overweight.  Making changes to your diet.  Quitting smoking.  Using a device to open your airway while you sleep, such as: ? An oral appliance. This is a custom-made mouthpiece that shifts your lower jaw forward. ? A continuous positive airway pressure (CPAP) device. This device delivers oxygen to your airway through a mask. ? A nasal expiratory positive airway pressure (EPAP) device. This device has valves that you put into each nostril. ? A bi-level positive airway pressure (BPAP) device. This device delivers oxygen to your airway through a mask.  Surgery if other treatments do not work. During surgery, excess tissue is removed to create a wider airway. It is important to get treatment for sleep apnea. Without treatment, this condition can lead to:  High blood pressure.  Coronary artery disease.  (Men) An inability to achieve or maintain an erection (impotence).  Reduced thinking abilities. Follow these instructions at  home:  Make any lifestyle changes that your health care provider recommends.  Eat a healthy, well-balanced diet.  Take over-the-counter and prescription medicines only as told by your health care provider.  Avoid using depressants, including alcohol, sedatives, and  narcotics.  Take steps to lose weight if you are overweight.  If you were given a device to open your airway while you sleep, use it only as told by your health care provider.  Do not use any tobacco products, such as cigarettes, chewing tobacco, and e-cigarettes. If you need help quitting, ask your health care provider.  Keep all follow-up visits as told by your health care provider. This is important. Contact a health care provider if:  The device that you received to open your airway during sleep is uncomfortable or does not seem to be working.  Your symptoms do not improve.  Your symptoms get worse. Get help right away if:  You develop chest pain.  You develop shortness of breath.  You develop discomfort in your back, arms, or stomach.  You have trouble speaking.  You have weakness on one side of your body.  You have drooping in your face. These symptoms may represent a serious problem that is an emergency. Do not wait to see if the symptoms will go away. Get medical help right away. Call your local emergency services (911 in the [place] Do not drive yourself to the hospital. This information is not intended to replace advice given to you by your health care provider. Make sure you discuss any questions you have with your health care provider. Document Released: 03/07/2002 Document Revised: 10/13/2016 Document Reviewed: 12/25/2014 Elsevier Interactive Patient Education  2019 [employer].

## 2018-04-12 NOTE — Progress Notes (Signed)
Kaitlyn Wood    [id number]    Oct 15, 1950  Primary Care Physician:Eubanks, Carlos American, NP  Referring Physician: Lauree Chandler, NP [place], [place] [address]   Chief complaint:   Patient with a history of snoring  HPI:  History of bipolar disorder Past history of significant snoring Used to have difficulty with sleep but currently on clonazepam and trazodone-sleeping better Usually goes to bed between 12 and 2 AM Takes about 20 minutes to fall asleep Final wake up time about 10 AM  No witnessed apneas but aware of significant snoring Morning headaches Dryness of her mouth in the mornings Used to have a lot of night sweats-much better  Not limited with activities Son has OSA  History of bipolar disorder-better controlled with medications at present History of PTSD History of chronic insomnia  Outpatient Encounter Medications as of 04/12/2018  Medication Sig  . ARIPiprazole ER (ABILIFY MAINTENA) 300 MG PRSY prefilled syringe Inject 300 mg into the muscle every 30 (thirty) days. Last injection 04/06/2018  . Ascorbic Acid (VITAMIN C) 1000 MG tablet Take 1,000 mg by mouth daily.  . ASTAXANTHIN PO Take 120 mg by mouth daily.   . clonazePAM (KLONOPIN) 1 MG tablet 1 mg at night, 0.5-1 mg during the day if needed  . gabapentin (NEURONTIN) 300 MG capsule Take 1 capsule (300 mg total) by mouth 3 (three) times daily.  Marland Kitchen lamoTRIgine (LAMICTAL) 200 MG tablet Take 200 mg by mouth 2 (two) times daily.   Marland Kitchen lisinopril (PRINIVIL,ZESTRIL) 10 MG tablet Take 1 tablet (10 mg total) by mouth daily.  Marland Kitchen loratadine (CLARITIN) 10 MG tablet Take 10 mg by mouth daily.  . metFORMIN (GLUCOPHAGE) 500 MG tablet Take 1 tablet (500 mg total) by mouth 2 (two) times daily with a meal.  . ONE TOUCH LANCETS MISC Use to test blood sugars twice daily DX: E 11.9  . PROLINE PO Take by mouth daily.  . traZODone (DESYREL) 50 MG tablet Take 25 mg by mouth at bedtime as needed.   .  [DISCONTINUED] loratadine (CLARITIN) 10 MG tablet Take 1 tablet (10 mg total) by mouth daily for 14 days.   No facility-administered encounter medications on file as of 04/12/2018.     Allergies as of 04/12/2018 - Review Complete 04/12/2018  Allergen Reaction Noted  . Oxycodone  10/09/2015  . Statins  10/09/2015    Past Medical History:  Diagnosis Date  . Anxiety   . Bipolar 1 disorder ([place])   . Bulging of cervical intervertebral disc    C2  . Bulging of thoracic intervertebral disc    t4  . Depression   . Diabetes mellitus without complication ([place])   . Hyperlipidemia   . PTSD (post-traumatic stress disorder)     Past Surgical History:  Procedure Laterality Date  . CATARACT EXTRACTION Right 08/11/2017  . CATARACT EXTRACTION Left 09/01/2017  . EYE SURGERY    . HEMICOLECTOMY  1997  . JOINT REPLACEMENT  2013   hip    Family History  Problem Relation Age of Onset  . Ovarian cancer Mother   . Lymphoma Father   . Colon cancer Sister     Social History   Socioeconomic History  . Marital status: Divorced    Spouse name: Not on file  . Number of children: Not on file  . Years of education: Not on file  . Highest education level: Not on file  Occupational History  .  Not on file  Social Needs  . Financial resource strain: Not hard at all  . Food insecurity:    Worry: Never true    Inability: Never true  . Transportation needs:    Medical: No    Non-medical: No  Tobacco Use  . Smoking status: Never Smoker  . Smokeless tobacco: Never Used  Substance and Sexual Activity  . Alcohol use: No  . Drug use: No  . Sexual activity: Not Currently  Lifestyle  . Physical activity:    Days per week: 0 days    Minutes per session: 0 min  . Stress: Rather much  Relationships  . Social connections:    Talks on phone: Once a week    Gets together: Once a week    Attends religious service: Never    Active member of club or organization: No    Attends meetings of clubs  or organizations: Never    Relationship status: Divorced  . Intimate partner violence:    Fear of current or ex partner: No    Emotionally abused: No    Physically abused: No    Forced sexual activity: No  Other Topics Concern  . Not on file  Social History Narrative   Diet? ? Fresh food- very little prepared food- no fast food.       Do you drink/eat things with caffeine?  Yes      Marital status?         Divorced                           What year were you married? 1978      Do you live in a house, apartment, assisted living, condo, trailer, etc.? apartment      Is it one or more stories? Yes      How many persons live in your home? 1      Do you have any pets in your home? (please list)  1 cat, Polly      Current or past profession: Air traffic controller, sales       Do you exercise?       yes                               Type & how often? 2-3 times a week      Do you have a living will? No      Do you have a DNR form?    no                              If not, do you want to discuss one? yes      Do you have signed POA/HPOA for forms? no    Review of Systems  Constitutional: Negative.   HENT: Negative.   Respiratory: Negative.   Cardiovascular: Negative.   Gastrointestinal: Negative.   Psychiatric/Behavioral: Positive for sleep disturbance.    Vitals:   04/12/18 1457  BP: 102/78  Pulse: (!) 111  SpO2: 98%     Physical Exam  Constitutional: She appears well-developed and well-nourished.  HENT:  Head: Normocephalic and atraumatic.  Mallampati 2  Eyes: Pupils are equal, round, and reactive to light. Conjunctivae are normal. Right eye exhibits no discharge.  Neck: Normal range of motion. No tracheal deviation present. No thyromegaly present.  Cardiovascular: Normal  rate and regular rhythm.  Pulmonary/Chest: Effort normal and breath sounds normal. No respiratory distress. She has no wheezes.  Abdominal: Soft. Bowel sounds are normal. She exhibits no  distension. There is no abdominal tenderness.   Results of the [facility] flowsheet 04/12/2018  Sitting and reading 3  Watching TV 0  Sitting, inactive in a public place (e.g. a theatre or a meeting) 0  As a passenger in a car for an hour without a break 0  Lying down to rest in the afternoon when circumstances permit 3  Sitting and talking to someone 0  Sitting quietly after a lunch without alcohol 0  In a car, while stopped for a few minutes in traffic 0  Total score 6   Assessment:  History of significant snoring  Moderate probability of significant sleep disordered breathing  Obesity  Plan/Recommendations: We will schedule patient for home sleep study  Physical activity encouraged  Pathophysiology of sleep disordered breathing discussed Treatment options for sleep disordered breathing discussed  I will see her back in the office in about 3 months   Sherrilyn Rist MD [facility] 04/12/2018, 3:14 PM  CC: Lauree Chandler, NP

## 2018-04-19 ENCOUNTER — Ambulatory Visit (INDEPENDENT_AMBULATORY_CARE_PROVIDER_SITE_OTHER): Payer: Medicare HMO | Admitting: Nurse Practitioner

## 2018-04-19 ENCOUNTER — Encounter: Payer: Self-pay | Admitting: Nurse Practitioner

## 2018-04-19 VITALS — BP 142/76 | HR 90 | Temp 98.0°F | Ht 63.0 in | Wt 201.2 lb

## 2018-04-19 DIAGNOSIS — G43809 Other migraine, not intractable, without status migrainosus: Secondary | ICD-10-CM | POA: Diagnosis not present

## 2018-04-19 DIAGNOSIS — M545 Low back pain, unspecified: Secondary | ICD-10-CM

## 2018-04-19 MED ORDER — KETOROLAC TROMETHAMINE 30 MG/ML IJ SOLN: 30.0000 mg | Freq: Once | INTRAMUSCULAR | Status: DC

## 2018-04-19 MED ORDER — KETOROLAC TROMETHAMINE 30 MG/ML IJ SOLN
30.0000 mg | Freq: Once | INTRAMUSCULAR | Status: AC
  Administered 2018-04-19: 30 mg via INTRAVENOUS

## 2018-04-19 NOTE — Progress Notes (Signed)
Careteam: Patient Care Team: Lauree Chandler, NP as PCP - General (Geriatric Medicine) Brien Few, MD as Consulting Physician (Obstetrics and Gynecology) Katy Apo, MD as Consulting Physician (Ophthalmology)  Advanced Directive information Does Patient Have a Medical Advance Directive?: Yes, Does patient want to make changes to medical advance directive?: Yes (Inpatient - patient requests chaplain consult to change a medical advance directive)  Allergies  Allergen Reactions  . Oxycodone     Migraines  . Statins     "makes my urine turn brown"    Chief Complaint  Patient presents with  . Acute Visit    side pain on left side symptoms for 5 days      HPI: Patient is a 68 y.o. female seen in the office today due to low back pain that goes up and down spine for ~1 week Also notes that there is pain deep in her buttocks. No injury.  Reports low back pain years ago.   Reports she was rearranging furniture last week.  Pain 7/10. Dull throbbing pain.  Unsure what was going on, thought possible kidney pain.  No pain with urination.   Hx of migraines- 7/10. In the past she gets Toradol which has been a miracle.  Been abt 5 years since she has had one this bad. Had has a migraine for 5 days. Light sensitity. Feels "rotten"  Eye pain and top of head.   Review of Systems:  Review of Systems  Constitutional: Negative for chills and fever.       Weight gain  HENT: Negative for congestion.   Eyes: Negative for blurred vision.  Respiratory: Negative for shortness of breath.   Cardiovascular: Negative for chest pain, palpitations and leg swelling.  Gastrointestinal: Negative for abdominal pain and heartburn.  Genitourinary: Negative for dysuria, frequency and urgency.  Musculoskeletal: Positive for back pain and myalgias. Negative for falls and joint pain.  Neurological: Positive for headaches. Negative for dizziness and loss of consciousness.  Psychiatric/Behavioral:  Negative for memory loss. The patient is nervous/anxious and has insomnia.        Bipolar with hypomania- following with psych-- mood much better with medication changes.     Past Medical History:  Diagnosis Date  . Anxiety   . Bipolar 1 disorder ([place])   . Bulging of cervical intervertebral disc    C2  . Bulging of thoracic intervertebral disc    t4  . Depression   . Diabetes mellitus without complication ([place])   . Hyperlipidemia   . PTSD (post-traumatic stress disorder)    Past Surgical History:  Procedure Laterality Date  . CATARACT EXTRACTION Right 08/11/2017  . CATARACT EXTRACTION Left 09/01/2017  . EYE SURGERY    . HEMICOLECTOMY  1997  . JOINT REPLACEMENT  2013   hip   Social History:   reports that she has never smoked. She has never used smokeless tobacco. She reports that she does not drink alcohol or use drugs.  Family History  Problem Relation Age of Onset  . Ovarian cancer Mother   . Lymphoma Father   . Colon cancer Sister     Medications: Patient's Medications  New Prescriptions   No medications on file  Previous Medications   ABILIFY MAINTENA 300 MG SRER INJECTION    Inject 300 mg as directed every 30 (thirty) days.   ARIPIPRAZOLE ER (ABILIFY MAINTENA) 300 MG PRSY PREFILLED SYRINGE    Inject 300 mg into the muscle every 30 (thirty) days. Last injection 04/06/2018  ASCORBIC ACID (VITAMIN C) 1000 MG TABLET    Take 1,000 mg by mouth daily.   ASTAXANTHIN PO    Take 12 mg by mouth daily.    CLONAZEPAM (KLONOPIN) 1 MG TABLET    1 mg. 1 mg at night,   GABAPENTIN (NEURONTIN) 300 MG CAPSULE    Take 1 capsule (300 mg total) by mouth 3 (three) times daily.   LAMOTRIGINE (LAMICTAL) 200 MG TABLET    Take 200 mg by mouth 2 (two) times daily.    LISINOPRIL (PRINIVIL,ZESTRIL) 10 MG TABLET    Take 1 tablet (10 mg total) by mouth daily.   LORATADINE (CLARITIN) 10 MG TABLET    Take 10 mg by mouth daily.   METFORMIN (GLUCOPHAGE) 500 MG TABLET    Take 1 tablet (500 mg total)  by mouth 2 (two) times daily with a meal.   ONE TOUCH LANCETS MISC    Use to test blood sugars twice daily DX: E 11.9   PROLINE PO    Take by mouth daily.   TRAZODONE (DESYREL) 50 MG TABLET    Take 25 mg by mouth at bedtime as needed.   Modified Medications   No medications on file  Discontinued Medications   No medications on file     Physical Exam:  Vitals:   04/19/18 1556  BP: (!) 142/76  Pulse: 90  Temp: 98 F (36.7 C)  TempSrc: Oral  SpO2: 98%  Weight: 201 lb 3.2 oz (91.3 kg)  Height: 5\' 3"  (1.6 m)   Body mass index is 35.64 kg/m.  Physical Exam Constitutional:      General: She is not in acute distress.    Appearance: She is well-developed. She is not diaphoretic.  HENT:     Head: Normocephalic and atraumatic.     Right Ear: Tympanic membrane normal.     Left Ear: Tympanic membrane normal.     Nose: Nose normal.     Mouth/Throat:     Mouth: Mucous membranes are moist.     Pharynx: No oropharyngeal exudate.  Eyes:     Extraocular Movements: Extraocular movements intact.     Conjunctiva/sclera: Conjunctivae normal.     Pupils: Pupils are equal, round, and reactive to light.  Cardiovascular:     Rate and Rhythm: Normal rate and regular rhythm.     Heart sounds: Normal heart sounds.  Pulmonary:     Effort: Pulmonary effort is normal.     Breath sounds: Normal breath sounds.  Musculoskeletal:     Lumbar back: She exhibits tenderness.       Back:  Skin:    General: Skin is warm and dry.  Neurological:     General: No focal deficit present.     Mental Status: She is alert and oriented to person, place, and time.     Cranial Nerves: No cranial nerve deficit.     Sensory: No sensory deficit.     Motor: No weakness.     Coordination: Coordination normal.     Gait: Gait normal.     Deep Tendon Reflexes: Reflexes normal.     Labs reviewed: Basic Metabolic Panel: Recent Labs    09/15/17 0945 01/04/18 1025 03/10/18 1407 04/07/18 1334  NA 140 139 139  136  K 4.6 4.2 4.7 5.4*  CL 103 102 105 104  CO2 26 27 25 22   GLUCOSE 170* 131* 225* 151*  BUN 24 20 20 20   CREATININE 1.04* 1.01* 0.96 0.89  CALCIUM 9.9  9.5 9.2 9.6  TSH 3.34  --   --   --    Liver Function Tests: Recent Labs    09/15/17 0945 01/04/18 1025 04/07/18 1334  AST 15 16 18   ALT 17 17 21   BILITOT 0.4 0.4 0.4  PROT 6.7 6.8 6.8   No results for input(s): LIPASE, AMYLASE in the last 8760 hours. No results for input(s): AMMONIA in the last 8760 hours. CBC: Recent Labs    01/04/18 1025 04/07/18 1334  WBC 5.4 5.1  NEUTROABS 3,397 3,759  HGB 14.1 13.8  HCT 42.1 40.4  MCV 89.6 88.2  PLT 237 221   Lipid Panel: Recent Labs    09/15/17 0945 01/04/18 1025 04/07/18 1334  CHOL 253* 261* 290*  HDL 48* 57 55  LDLCALC 168* 168* 189*  TRIG 209* 202* 246*  CHOLHDL 5.3* 4.6 5.3*   TSH: Recent Labs    09/15/17 0945  TSH 3.34   A1C: Lab Results  Component Value Date   HGBA1C 7.3 (H) 04/07/2018     Assessment/Plan  1. Other migraine without status migrainosus, not intractable - ketorolac (TORADOL) 30 MG/ML injection 30 mg  2. Pain of lumbar spine - ketorolac (TORADOL) 30 MG/ML injection 30 mg given in office Tomorrow to start aleve 220 mg by mouth twice daily (with food) to use heating pad TID with muscle rub after heat -no heavy lifting. If having trouble walking, loss of bowel or bladder, worsening pain to seek medical attention immediately.  -follow up precautions given   Carlos American. [place], [facility] [phone]

## 2018-04-19 NOTE — Patient Instructions (Addendum)
Tomorrow To start aleve 220 mg by mouth twice daily for 5 days for back pain. Take with food To use heating pad 3 times daily and then apply muscle rub AFTER heat No heavy lifting.   To notify if pain persist or worsens.    Low Back Sprain  A sprain is a stretch or tear in the bands of tissue that hold bones and joints together (ligaments). Sprains of the lower back (lumbar spine) are a common cause of low back pain. A sprain occurs when ligaments are overextended or stretched beyond their limits. The ligaments can become inflamed, resulting in pain and sudden muscle tightening (spasms). A sprain can be caused by an injury (trauma), or it can develop gradually due to overuse. There are three types of sprains:  Grade 1 is a mild sprain involving an overstretched ligament or a very slight tear of the ligament.  Grade 2 is a moderate sprain involving a partial tear of the ligament.  Grade 3 is a severe sprain involving a complete tear of the ligament. What are the causes? This condition may be caused by:  Trauma, such as a fall or a hit to the body.  Twisting or overstretching the back. This may result from doing activities that require a lot of energy, such as lifting heavy objects. What increases the risk? The following factors may increase your risk of getting this condition:  Playing contact sports.  Participating in sports or activities that put excessive stress on the back and require a lot of bending and twisting, including: ? Lifting weights or heavy objects. ? Gymnastics. ? Soccer. ? Figure skating. ? Snowboarding.  Being overweight or obese.  Having poor strength and flexibility. What are the signs or symptoms? Symptoms of this condition may include:  Sharp or dull pain in the lower back that does not go away. Pain may extend to the buttocks.  Stiffness.  Limited range of motion.  Inability to stand up straight due to stiffness or pain.  Muscle spasms. How is  this diagnosed? This condition may be diagnosed based on:  Your symptoms.  Your medical history.  A physical exam. ? Your health care provider may push on certain areas of your back to determine the source of your pain. ? You may be asked to bend forward, backward, and side to side to assess the severity of your pain and your range of motion.  Imaging tests, such as: ? X-rays. ? MRI. How is this treated? Treatment for this condition may include:  Applying heat and cold to the affected area.  Medicines to help relieve pain and to relax your muscles (muscle relaxants).  NSAIDs to help reduce swelling and discomfort.  Physical therapy. When your symptoms improve, it is important to gradually return to your normal routine as soon as possible to reduce pain, avoid stiffness, and avoid loss of muscle strength. Generally, symptoms should improve within 6 weeks of treatment. However, recovery time varies. Follow these instructions at home: Managing pain, stiffness, and swelling   If directed, apply ice to the injured area during the first 24 hours after your injury. ? Put ice in a plastic bag. ? Place a towel between your skin and the bag. ? Leave the ice on for 20 minutes, 2-3 times a day.  If directed, apply heat to the affected area as often as told by your health care provider. Use the heat source that your health care provider recommends, such as a moist heat pack or  a heating pad. ? Place a towel between your skin and the heat source. ? Leave the heat on for 20-30 minutes. ? Remove the heat if your skin turns bright red. This is especially important if you are unable to feel pain, heat, or cold. You may have a greater risk of getting burned. Activity  Rest and return to your normal activities as told by your health care provider. Ask your health care provider what activities are safe for you.  Avoid activities that take a lot of effort (are strenuous) for as long as told by  your health care provider.  Do exercises as told by your health care provider. General instructions   Take over-the-counter and prescription medicines only as told by your health care provider.  If you have questions or concerns about safety while taking pain medicine, talk with your health care provider.  Do not drive or operate heavy machinery until you know how your pain medicine affects you.  Do not use any tobacco products, such as cigarettes, chewing tobacco, and e-cigarettes. Tobacco can delay bone healing. If you need help quitting, ask your health care provider.  Keep all follow-up visits as told by your health care provider. This is important. How is this prevented?  Warm up and stretch before being active.  Cool down and stretch after being active.  Give your body time to rest between periods of activity.  Avoid: ? Being physically inactive for long periods at a time. ? Exercising or playing sports when you are tired or in pain.  Use correct form when playing sports and lifting heavy objects.  Use good posture when sitting and standing.  Maintain a healthy weight.  Sleep on a mattress with medium firmness to support your back.  Make sure to use equipment that fits you, including shoes that fit well.  Be safe and responsible while being active to avoid falls.  Do at least 150 minutes of moderate-intensity exercise each week, such as brisk walking or water aerobics. Try a form of exercise that takes stress off your back, such as swimming or stationary cycling.  Maintain physical fitness, including: ? Strength. In particular, develop and maintain strong abdominal muscles. ? Flexibility. ? Cardiovascular fitness. ? Endurance. Contact a health care provider if:  Your back pain does not improve after 6 weeks of treatment.  Your symptoms get worse. Get help right away if:  Your back pain is severe.  You are unable to stand or walk.  You develop pain in  your legs.  You develop weakness in your buttocks or legs.  You have difficulty controlling when you urinate or when you have a bowel movement. This information is not intended to replace advice given to you by your health care provider. Make sure you discuss any questions you have with your health care provider. Document Released: 03/17/2005 Document Revised: 11/22/2015 Document Reviewed: 12/27/2014 Elsevier Interactive Patient Education  2019 [employer].

## 2018-04-19 NOTE — Progress Notes (Signed)
Patient was in office and received Toradol 30mg  in her right ventrogluteal. Patient tolerated well

## 2018-05-06 ENCOUNTER — Ambulatory Visit: Payer: Medicare HMO | Admitting: Nurse Practitioner

## 2018-05-07 ENCOUNTER — Encounter: Payer: Self-pay | Admitting: Family

## 2018-05-07 ENCOUNTER — Ambulatory Visit (INDEPENDENT_AMBULATORY_CARE_PROVIDER_SITE_OTHER): Payer: Medicare HMO | Admitting: Family

## 2018-05-07 VITALS — BP 138/80 | HR 96 | Temp 97.9°F | Resp 18 | Ht 63.0 in | Wt 205.4 lb

## 2018-05-07 DIAGNOSIS — I10 Essential (primary) hypertension: Secondary | ICD-10-CM | POA: Diagnosis not present

## 2018-05-07 DIAGNOSIS — F411 Generalized anxiety disorder: Secondary | ICD-10-CM | POA: Diagnosis not present

## 2018-05-07 MED ORDER — BLOOD PRESSURE CUFF MISC: 0 refills | Status: DC

## 2018-05-07 NOTE — Progress Notes (Signed)
Provider: Claritza July FNP-C  Lauree Chandler, NP  Patient Care Team: Lauree Chandler, NP as PCP - General (Geriatric Medicine) Brien Few, MD as Consulting Physician (Obstetrics and Gynecology) Katy Apo, MD as Consulting Physician (Ophthalmology)  Extended Emergency Contact Information Primary Emergency Contact: [place], [place] Phone: [phone] Relation: Son  Goals of care: Advanced Directive information Advanced Directives 05/07/2018  Does Patient Have a Medical Advance Directive? No  Does patient want to make changes to medical advance directive? -  Would patient like information on creating a medical advance directive? -     Chief Complaint  Patient presents with  . Acute Visit    Elevated BP, patient states she has taken extra lisinopril last night bc bp was elevated states it was 140/? then 190/ ? , duration less than a week    HPI:  Pt is a 68 y.o. female seen today for an acute visit for evaluation of high blood pressure.she states has been checking her blood pressure several times a day at home readings ranging in the 160's-180's last night readings was 188/105.she took two doses of lisinopril last night.She denies any headache,dizziness,blurry vision,shortness of breath or chest pain.she does states stays anxious all the time.she is planning to request her Psychiatrist to adjust her Klonopin dose.she has an appointment with Psychiatry later today.  She also request a kit for her Cologuard.Order already placed by [name] NP on [employer].Patient previously send back kit without any specimen per record.   Past Medical History:  Diagnosis Date  . Anxiety   . Bipolar 1 disorder ([place])   . Bulging of cervical intervertebral disc    C2  . Bulging of thoracic intervertebral disc    t4  . Depression   . Diabetes mellitus without complication ([place])   . Hyperlipidemia   . PTSD  (post-traumatic stress disorder)    Past Surgical History:  Procedure Laterality Date  . CATARACT EXTRACTION Right 08/11/2017  . CATARACT EXTRACTION Left 09/01/2017  . EYE SURGERY    . HEMICOLECTOMY  1997  . JOINT REPLACEMENT  2013   hip    Allergies  Allergen Reactions  . Oxycodone     Migraines  . Statins     "makes my urine turn brown"    Outpatient Encounter Medications as of 05/07/2018  Medication Sig  . ABILIFY MAINTENA 300 MG SRER injection Inject 300 mg as directed every 30 (thirty) days.  . ARIPiprazole ER (ABILIFY MAINTENA) 300 MG PRSY prefilled syringe Inject 300 mg into the muscle every 30 (thirty) days. Last injection 04/06/2018  . Ascorbic Acid (VITAMIN C) 1000 MG tablet Take 1,000 mg by mouth daily.  . ASTAXANTHIN PO Take 12 mg by mouth daily.   Jolyne Loa Grape-Goldenseal (BERBERINE COMPLEX PO) Take 1 tablet by mouth 3 (three) times daily with meals.  . clonazePAM (KLONOPIN) 1 MG tablet 1 mg. 1 mg at night,  . gabapentin (NEURONTIN) 300 MG capsule Take 1 capsule (300 mg total) by mouth 3 (three) times daily.  Marland Kitchen lamoTRIgine (LAMICTAL) 200 MG tablet Take 200 mg by mouth 2 (two) times daily.   Marland Kitchen lisinopril (PRINIVIL,ZESTRIL) 10 MG tablet Take 1 tablet (10 mg total) by mouth daily.  Marland Kitchen loratadine (CLARITIN) 10 MG tablet Take 10 mg by mouth daily.  . metFORMIN (GLUCOPHAGE) 500 MG tablet Take 1 tablet (500 mg total) by mouth 2 (two) times daily with a  meal.  . ONE TOUCH LANCETS MISC Use to test blood sugars twice daily DX: E 11.9  . PROLINE PO Take by mouth daily.  . traZODone (DESYREL) 50 MG tablet Take 25 mg by mouth at bedtime as needed.   . Blood Pressure Monitoring (BLOOD PRESSURE CUFF) MISC Check blood pressure twice daily   No facility-administered encounter medications on file as of 05/07/2018.     Review of Systems  Constitutional: Negative for appetite change, chills, fatigue and fever.  HENT: Negative for congestion, postnasal drip, rhinorrhea, sinus  pressure, sinus pain, sneezing and sore throat.   Eyes: Negative for pain, redness and itching.  Respiratory: Negative for cough, chest tightness, shortness of breath and wheezing.   Cardiovascular: Negative for chest pain, palpitations and leg swelling.  Gastrointestinal: Negative for abdominal distention, abdominal pain, constipation, diarrhea, nausea and vomiting.  Genitourinary: Negative for dysuria, flank pain, frequency and urgency.  Musculoskeletal: Negative for arthralgias and myalgias.  Skin: Negative for color change, pallor and rash.  Neurological: Negative for dizziness, light-headedness and headaches.  Psychiatric/Behavioral: Negative for agitation and confusion. The patient is nervous/anxious.     Immunization History  Administered Date(s) Administered  . Influenza,inj,Quad PF,6+ Mos 04/04/2016  . Influenza-Unspecified 12/29/2013, 12/14/2017  . Pneumococcal Conjugate-13 01/04/2016  . Pneumococcal Polysaccharide-23 09/30/2017  . Tdap 09/29/2015  . Zoster Recombinat (Shingrix) 12/05/2017   Pertinent  Health Maintenance Due  Topic Date Due  . COLONOSCOPY  05/01/1999  . OPHTHALMOLOGY EXAM  05/25/2018  . HEMOGLOBIN A1C  10/06/2018  . FOOT EXAM  01/06/2019  . MAMMOGRAM  05/02/2019  . INFLUENZA VACCINE  Completed  . DEXA SCAN  Completed  . PNA vac Low Risk Adult  Completed   Fall Risk  05/07/2018 04/19/2018 03/10/2018 03/04/2018 02/18/2018  Falls in the past year? 0 0 _0 Number falls in past yr: 0 0 _1 Injury with Fall? 0 0 0 1 0  Comment - - - - -  Risk for fall due to : - - Impaired balance/gait - -    Vitals:   05/07/18 1307  BP: 138/80  Pulse: 96  Resp: 18  Temp: 97.9 F (36.6 C)  TempSrc: Oral  SpO2: 96%  Weight: 205 lb 6.4 oz (93.2 kg)  Height: 5' 3" (1.6 m)   Body mass index is 36.38 kg/m. Physical Exam Constitutional:      General: She is not in acute distress.    Appearance: She is obese.  HENT:     Head: Normocephalic.     Right Ear:  Tympanic membrane, ear canal and external ear normal. There is no impacted cerumen.     Left Ear: Tympanic membrane, ear canal and external ear normal. There is no impacted cerumen.     Nose: Nose normal. No congestion or rhinorrhea.     Mouth/Throat:     Mouth: Mucous membranes are moist.     Pharynx: Oropharynx is clear. No oropharyngeal exudate or posterior oropharyngeal erythema.  Eyes:     General: No scleral icterus.       Right eye: No discharge.        Left eye: No discharge.     Conjunctiva/sclera: Conjunctivae normal.     Pupils: Pupils are equal, round, and reactive to light.  Cardiovascular:     Rate and Rhythm: Normal rate and regular rhythm.     Pulses: Normal pulses.     Heart sounds: No murmur. Friction rub present. No gallop.  Pulmonary:     Effort: Pulmonary effort is normal. No respiratory distress.     Breath sounds: Normal breath sounds. No wheezing, rhonchi or rales.  Chest:     Chest wall: No tenderness.  Abdominal:     General: Bowel sounds are normal. There is no distension.     Palpations: Abdomen is soft. There is no mass.     Tenderness: There is no abdominal tenderness. There is no right CVA tenderness, left CVA tenderness, guarding or rebound.  Musculoskeletal: Normal range of motion.        General: No tenderness.     Right lower leg: No edema.     Left lower leg: No edema.  Skin:    General: Skin is warm and dry.     Coloration: Skin is not pale.     Findings: No erythema or rash.  Neurological:     Mental Status: She is alert and oriented to person, place, and time.     Cranial Nerves: No cranial nerve deficit.     Motor: No weakness.     Coordination: Coordination normal.     Gait: Gait normal.  Psychiatric:        Mood and Affect: Mood is anxious. Affect is labile. Affect is not inappropriate.        Speech: Speech normal.        Behavior: Behavior normal.        Thought Content: Thought content normal.        Judgment: Judgment normal.       Labs reviewed: Recent Labs    01/04/18 1025 03/10/18 1407 04/07/18 1334  NA 139 139 136  K 4.2 4.7 5.4*  CL 102 105 104  CO2 _0 GLUCOSE 131* 225* 151*  BUN _1 CREATININE 1.01* 0.96 0.89  CALCIUM 9.5 9.2 9.6   Recent Labs    09/15/17 0945 01/04/18 1025 04/07/18 1334  AST _2 ALT _3 BILITOT 0.4 0.4 0.4  PROT 6.7 6.8 6.8   Recent Labs    01/04/18 1025 04/07/18 1334  WBC 5.4 5.1  NEUTROABS 3,397 3,759  HGB 14.1 13.8  HCT 42.1 40.4  MCV 89.6 88.2  PLT 237 221   Lab Results  Component Value Date   TSH 3.34 09/15/2017   Lab Results  Component Value Date   HGBA1C 7.3 (H) 04/07/2018   Lab Results  Component Value Date   CHOL 290 (H) 04/07/2018   HDL 55 04/07/2018   LDLCALC 189 (H) 04/07/2018   TRIG 246 (H) 04/07/2018   CHOLHDL 5.3 (H) 04/07/2018    Significant Diagnostic Results in last 30 days:  No results found.  Assessment/Plan  1. Essential hypertension B/p within normal range today and previous visit too.Reports elevated blood pressure at home on her home machine.Asymptomatic.though has chronic anxiety possible contributing to her blood pressure readings variation.continue on Lisinopril 10 mg tablet daily. - High blood pressure education information provided today. - check B/p twice daily in the morning and afternoon.B/p log given encouraged to Notify provider's office if B/p > 160/90. - Script for B/p machine send electronically per patient's request.    2. Anxiety  Chronic. On clonazepam 1 mg tablet at night.Has a follow up appointment with Pyschiatry this afternoon.   Family/ staff Communication: Reviewed plan of care with patient   Labs/tests ordered: None   Dinah C Ngetich, NP

## 2018-05-07 NOTE — Patient Instructions (Addendum)
1.Check your blood pressure twice daily in the morning and afternoon.Notify Provider if B/p > 160/90  2. Change your blood pressure machine.script send to pharmacy   Hypertension Hypertension, commonly called high blood pressure, is when the force of blood pumping through the arteries is too strong. The arteries are the blood vessels that carry blood from the heart throughout the body. Hypertension forces the heart to work harder to pump blood and may cause arteries to become narrow or stiff. Having untreated or uncontrolled hypertension can cause heart attacks, strokes, kidney disease, and other problems. A blood pressure reading consists of a higher number over a lower number. Ideally, your blood pressure should be below 120/80. The first ("top") number is called the systolic pressure. It is a measure of the pressure in your arteries as your heart beats. The second ("bottom") number is called the diastolic pressure. It is a measure of the pressure in your arteries as the heart relaxes. What are the causes? The cause of this condition is not known. What increases the risk? Some risk factors for high blood pressure are under your control. Others are not. Factors you can change  Smoking.  Having type 2 diabetes mellitus, high cholesterol, or both.  Not getting enough exercise or physical activity.  Being overweight.  Having too much fat, sugar, calories, or salt (sodium) in your diet.  Drinking too much alcohol. Factors that are difficult or impossible to change  Having chronic kidney disease.  Having a family history of high blood pressure.  Age. Risk increases with age.  Race. You may be at higher risk if you are African-American.  Gender. Men are at higher risk than women before age 61. After age 4, women are at higher risk than men.  Having obstructive sleep apnea.  Stress. What are the signs or symptoms? Extremely high blood pressure (hypertensive crisis) may  cause:  Headache.  Anxiety.  Shortness of breath.  Nosebleed.  Nausea and vomiting.  Severe chest pain.  Jerky movements you cannot control (seizures). How is this diagnosed? This condition is diagnosed by measuring your blood pressure while you are seated, with your arm resting on a surface. The cuff of the blood pressure monitor will be placed directly against the skin of your upper arm at the level of your heart. It should be measured at least twice using the same arm. Certain conditions can cause a difference in blood pressure between your right and left arms. Certain factors can cause blood pressure readings to be lower or higher than normal (elevated) for a short period of time:  When your blood pressure is higher when you are in a health care provider's office than when you are at home, this is called white coat hypertension. Most people with this condition do not need medicines.  When your blood pressure is higher at home than when you are in a health care provider's office, this is called masked hypertension. Most people with this condition may need medicines to control blood pressure. If you have a high blood pressure reading during one visit or you have normal blood pressure with other risk factors:  You may be asked to return on a different day to have your blood pressure checked again.  You may be asked to monitor your blood pressure at home for 1 week or longer. If you are diagnosed with hypertension, you may have other blood or imaging tests to help your health care provider understand your overall risk for other conditions. How  is this treated? This condition is treated by making healthy lifestyle changes, such as eating healthy foods, exercising more, and reducing your alcohol intake. Your health care provider may prescribe medicine if lifestyle changes are not enough to get your blood pressure under control, and if:  Your systolic blood pressure is above 130.  Your  diastolic blood pressure is above 80. Your personal target blood pressure may vary depending on your medical conditions, your age, and other factors. Follow these instructions at home: Eating and drinking   Eat a diet that is high in fiber and potassium, and low in sodium, added sugar, and fat. An example eating plan is called the DASH (Dietary Approaches to Stop Hypertension) diet. To eat this way: ? Eat plenty of fresh fruits and vegetables. Try to fill half of your plate at each meal with fruits and vegetables. ? Eat whole grains, such as whole wheat pasta, brown rice, or whole grain bread. Fill about one quarter of your plate with whole grains. ? Eat or drink low-fat dairy products, such as skim milk or low-fat yogurt. ? Avoid fatty cuts of meat, processed or cured meats, and poultry with skin. Fill about one quarter of your plate with lean proteins, such as fish, chicken without skin, beans, eggs, and tofu. ? Avoid premade and processed foods. These tend to be higher in sodium, added sugar, and fat.  Reduce your daily sodium intake. Most people with hypertension should eat less than 1,500 mg of sodium a day.  Limit alcohol intake to no more than 1 drink a day for nonpregnant women and 2 drinks a day for men. One drink equals 12 oz of beer, 5 oz of wine, or 1 oz of hard liquor. Lifestyle   Work with your health care provider to maintain a healthy body weight or to lose weight. Ask what an ideal weight is for you.  Get at least 30 minutes of exercise that causes your heart to beat faster (aerobic exercise) most days of the week. Activities may include walking, swimming, or biking.  Include exercise to strengthen your muscles (resistance exercise), such as pilates or lifting weights, as part of your weekly exercise routine. Try to do these types of exercises for 30 minutes at least 3 days a week.  Do not use any products that contain nicotine or tobacco, such as cigarettes and  e-cigarettes. If you need help quitting, ask your health care provider.  Monitor your blood pressure at home as told by your health care provider.  Keep all follow-up visits as told by your health care provider. This is important. Medicines  Take over-the-counter and prescription medicines only as told by your health care provider. Follow directions carefully. Blood pressure medicines must be taken as prescribed.  Do not skip doses of blood pressure medicine. Doing this puts you at risk for problems and can make the medicine less effective.  Ask your health care provider about side effects or reactions to medicines that you should watch for. Contact a health care provider if:  You think you are having a reaction to a medicine you are taking.  You have headaches that keep coming back (recurring).  You feel dizzy.  You have swelling in your ankles.  You have trouble with your vision. Get help right away if:  You develop a severe headache or confusion.  You have unusual weakness or numbness.  You feel faint.  You have severe pain in your chest or abdomen.  You vomit repeatedly.  You have trouble breathing. Summary  Hypertension is when the force of blood pumping through your arteries is too strong. If this condition is not controlled, it may put you at risk for serious complications.  Your personal target blood pressure may vary depending on your medical conditions, your age, and other factors. For most people, a normal blood pressure is less than 120/80.  Hypertension is treated with lifestyle changes, medicines, or a combination of both. Lifestyle changes include weight loss, eating a healthy, low-sodium diet, exercising more, and limiting alcohol. This information is not intended to replace advice given to you by your health care provider. Make sure you discuss any questions you have with your health care provider. Document Released: 03/17/2005 Document Revised: 02/13/2016  Document Reviewed: 02/13/2016 Elsevier Interactive Patient Education  2019 [employer].

## 2018-06-04 ENCOUNTER — Other Ambulatory Visit: Payer: Medicare HMO

## 2018-06-07 DIAGNOSIS — F3181 Bipolar II disorder: Secondary | ICD-10-CM | POA: Diagnosis not present

## 2018-06-09 ENCOUNTER — Ambulatory Visit: Payer: Medicare HMO | Admitting: Nurse Practitioner

## 2018-06-28 ENCOUNTER — Ambulatory Visit (INDEPENDENT_AMBULATORY_CARE_PROVIDER_SITE_OTHER): Payer: Medicare HMO | Admitting: Nurse Practitioner

## 2018-06-28 ENCOUNTER — Telehealth: Payer: Self-pay | Admitting: *Deleted

## 2018-06-28 ENCOUNTER — Other Ambulatory Visit: Payer: Self-pay

## 2018-06-28 ENCOUNTER — Encounter: Payer: Self-pay | Admitting: Nurse Practitioner

## 2018-06-28 DIAGNOSIS — J069 Acute upper respiratory infection, unspecified: Secondary | ICD-10-CM

## 2018-06-28 NOTE — Telephone Encounter (Signed)
Patient called and left message on Clinical Intake at 4:45 Friday afternoon stating that she was having flu like symptoms. Stated that she had congestion, Headache and Fever on and off and sleeping a lot.   Tried calling patient back to schedule a Televisit. LMOM to return call.

## 2018-06-28 NOTE — Telephone Encounter (Signed)
Appointment scheduled for this afternoon for Televisit.

## 2018-06-28 NOTE — Progress Notes (Signed)
This service is provided via telemedicine  No vital signs collected/recorded due to the encounter was a telemedicine visit.   Location of patient (ex: home, work):  Home   Patient consents to a telephone visit:  Yes  Location of the provider (ex: office, home):  [employer], Office   Name of any referring provider:  [name], NP  Names of all persons participating in the telemedicine service and their role in the encounter:  S.Chrae B/CMA, Lauree Chandler, NP and patient   Time spent on call:  10 min with CMA  Virtual Visit via Telephone Note  I connected with Kaitlyn Wood on 06/28/18 at  2:15 PM EDT by telephone and verified that I am speaking with the correct person using two identifiers.   I discussed the limitations, risks, security and privacy concerns of performing an evaluation and management service by telephone and the availability of in person appointments. I also discussed with the patient that there may be a patient responsible charge related to this service. The patient expressed understanding and agreed to proceed.     Careteam: Patient Care Team: Lauree Chandler, NP as PCP - General (Geriatric Medicine) Brien Few, MD as Consulting Physician (Obstetrics and Gynecology) Katy Apo, MD as Consulting Physician (Ophthalmology)  Advanced Directive information    Allergies  Allergen Reactions  . Oxycodone     Migraines  . Statins     "makes my urine turn brown"    Chief Complaint  Patient presents with  . Acute Visit    Patient c/o congestion, sore throat, fever(off/on), headache and sleep issues     HPI: Patient is a 68 y.o. female  Reports she is having fever, sore throat, headaches, coughing and some sneezing reports symptoms started around 06/15/2018.  Hx of headaches but these headaches are different.  Reports temperature has been varying between 99-100. States she will have a temperature for several days then it will go  away.  Felt better this weekend so she did a lot and now feeling poorly again. Reports ear pain and sore throat that restarted today.  Reports temperature today 98.9. Has not been in contact with any known COVID-19 cases but had been out in the community a lot in the beginning of the month.  Reports one morning she woke up and had shortness of breath but resolved quickly and none since.  No chest congestion but coughing sneezing. Does not feel like she needs immediate treatment but wanted to make [place] aware incase things go in another direction.  Reports hx of sinus problems and allergies and she is taking Claritin 10 mg by mouth daily. Has not been outside and is in a "sealed environment" Good appetite. Eating and drinking well.  Had diarrhea when symptoms first started.  Staying inside and self isolating.      Review of Systems:  Review of Systems  Constitutional: Positive for chills, fever and malaise/fatigue.  HENT: Positive for congestion, ear pain and sore throat.   Respiratory: Positive for cough. Negative for sputum production, shortness of breath and wheezing.   Cardiovascular: Negative for chest pain and leg swelling.  Skin: Negative for itching and rash.    Past Medical History:  Diagnosis Date  . Anxiety   . Bipolar 1 disorder ([place])   . Bulging of cervical intervertebral disc    C2  . Bulging of thoracic intervertebral disc    t4  . Depression   . Diabetes mellitus without complication ([place])   .  Hyperlipidemia   . PTSD (post-traumatic stress disorder)    Past Surgical History:  Procedure Laterality Date  . CATARACT EXTRACTION Right 08/11/2017  . CATARACT EXTRACTION Left 09/01/2017  . EYE SURGERY    . HEMICOLECTOMY  1997  . JOINT REPLACEMENT  2013   hip   Social History:   reports that she has never smoked. She has never used smokeless tobacco. She reports that she does not drink alcohol or use drugs.  Family History  Problem Relation Age of Onset  .  Ovarian cancer Mother   . Lymphoma Father   . Colon cancer Sister     Medications: Patient's Medications  New Prescriptions   No medications on file  Previous Medications   ARIPIPRAZOLE ER (ABILIFY MAINTENA) 300 MG PRSY PREFILLED SYRINGE    Inject 300 mg into the muscle every 30 (thirty) days. Last injection 04/06/2018   ASCORBIC ACID (VITAMIN C) 1000 MG TABLET    Take 1,000 mg by mouth daily.   ASTAXANTHIN PO    Take 12 mg by mouth daily.    BARBERRY-OREG GRAPE-GOLDENSEAL (BERBERINE COMPLEX PO)    Take 1 tablet by mouth 3 (three) times daily with meals.   BLOOD PRESSURE MONITORING (BLOOD PRESSURE CUFF) MISC    Check blood pressure twice daily   CLONAZEPAM (KLONOPIN) 1 MG TABLET    1 mg. 1 mg at night, 1/2 tablet during the day if needed   GABAPENTIN (NEURONTIN) 300 MG CAPSULE    Take 1 capsule (300 mg total) by mouth 3 (three) times daily.   GLUCOSE BLOOD (ACCU-CHEK GUIDE) TEST STRIP    1 each by Other route 3 (three) times daily. E11.9   LAMOTRIGINE (LAMICTAL) 200 MG TABLET    Take 200 mg by mouth 2 (two) times daily.    LISINOPRIL (PRINIVIL,ZESTRIL) 10 MG TABLET    Take 1 tablet (10 mg total) by mouth daily.   LORATADINE (CLARITIN) 10 MG TABLET    Take 10 mg by mouth daily.   METFORMIN (GLUCOPHAGE) 500 MG TABLET    Take 1 tablet (500 mg total) by mouth 2 (two) times daily with a meal.   TRAZODONE (DESYREL) 50 MG TABLET    Take 50 mg by mouth at bedtime as needed.   Modified Medications   No medications on file  Discontinued Medications   ABILIFY MAINTENA 300 MG SRER INJECTION    Inject 300 mg as directed every 30 (thirty) days.   ONE TOUCH LANCETS MISC    Use to test blood sugars twice daily DX: E 11.9   PROLINE PO    Take by mouth daily.     Physical Exam: Unable due to tele-visit.   Labs reviewed: Basic Metabolic Panel: Recent Labs    09/15/17 0945 01/04/18 1025 03/10/18 1407 04/07/18 1334  NA 140 139 139 136  K 4.6 4.2 4.7 5.4*  CL 103 102 105 104  CO2 26 27 25 22    GLUCOSE 170* 131* 225* 151*  BUN 24 20 20 20   CREATININE 1.04* 1.01* 0.96 0.89  CALCIUM 9.9 9.5 9.2 9.6  TSH 3.34  --   --   --    Liver Function Tests: Recent Labs    09/15/17 0945 01/04/18 1025 04/07/18 1334  AST 15 16 18   ALT 17 17 21   BILITOT 0.4 0.4 0.4  PROT 6.7 6.8 6.8   No results for input(s): LIPASE, AMYLASE in the last 8760 hours. No results for input(s): AMMONIA in the last 8760 hours. CBC:  Recent Labs    01/04/18 1025 04/07/18 1334  WBC 5.4 5.1  NEUTROABS 3,397 3,759  HGB 14.1 13.8  HCT 42.1 40.4  MCV 89.6 88.2  PLT 237 221   Lipid Panel: Recent Labs    09/15/17 0945 01/04/18 1025 04/07/18 1334  CHOL 253* 261* 290*  HDL 48* 57 55  LDLCALC 168* 168* 189*  TRIG 209* 202* 246*  CHOLHDL 5.3* 4.6 5.3*   TSH: Recent Labs    09/15/17 0945  TSH 3.34   A1C: Lab Results  Component Value Date   HGBA1C 7.3 (H) 04/07/2018     Assessment/Plan 1. Viral upper respiratory tract infection Overall symptoms seem to be improving however today slightly worse. Afebrile. Encouraged to stay hydrated, use tylenol as needed for sore throat, myalgias or fever.  Instructed to follow up as needed but to call 911 for life threatening symptoms. In light of COVID-19 to continue self quarantine until symptoms resolved and fever free for 72 hours.    Carlos American. Harle Battiest  [facility] [phone]   AWV printed and mailed  Follow Up Instructions:    I discussed the assessment and treatment plan with the patient. The patient was provided an opportunity to ask questions and all were answered. The patient agreed with the plan and demonstrated an understanding of the instructions.   The patient was advised to call back or seek an in-person evaluation if the symptoms worsen or if the condition fails to improve as anticipated.  I provided 12 minutes of non-face-to-face time during this encounter.   Leigh [name] [place], [place]

## 2018-06-28 NOTE — Patient Instructions (Signed)
To use tylenol 500 mg 1-2 tablets every 8 hours as needed for fever, sore throat  To notify if symptoms worsen or fail to improve.   For life threatening symptoms (can not breath, confusion, altered mental status) call 911

## 2018-07-02 ENCOUNTER — Other Ambulatory Visit: Payer: Medicare HMO

## 2018-07-08 ENCOUNTER — Ambulatory Visit: Payer: Medicare HMO | Admitting: Nurse Practitioner

## 2018-07-08 DIAGNOSIS — F3181 Bipolar II disorder: Secondary | ICD-10-CM | POA: Diagnosis not present

## 2018-07-12 ENCOUNTER — Encounter: Payer: Self-pay | Admitting: Nurse Practitioner

## 2018-07-12 ENCOUNTER — Telehealth: Payer: Self-pay | Admitting: *Deleted

## 2018-07-12 ENCOUNTER — Other Ambulatory Visit: Payer: Self-pay

## 2018-07-12 ENCOUNTER — Ambulatory Visit (INDEPENDENT_AMBULATORY_CARE_PROVIDER_SITE_OTHER): Payer: Medicare HMO | Admitting: Nurse Practitioner

## 2018-07-12 DIAGNOSIS — J069 Acute upper respiratory infection, unspecified: Secondary | ICD-10-CM | POA: Diagnosis not present

## 2018-07-12 DIAGNOSIS — F310 Bipolar disorder, current episode hypomanic: Secondary | ICD-10-CM | POA: Diagnosis not present

## 2018-07-12 NOTE — Patient Instructions (Signed)
Home health referral has been placed

## 2018-07-12 NOTE — Telephone Encounter (Signed)
Patient notified and agreed. TeleVisit scheduled for 07/13/18

## 2018-07-12 NOTE — Telephone Encounter (Signed)
Lets have her do a tele-visit, lots of variables with this and would rather talk with her.

## 2018-07-12 NOTE — Telephone Encounter (Signed)
Patient called and stated that she needs an injection for her Abilify. Stated that she CANNOT get it at her Psych. Office due to having a viral issue, they won't see her. Stated that she called her insurance company and they stated that she can get a referral from her PCP and have [facility] come out and give her the injection. Had a TeleVisit on 06/28/2018 for URI. Please Advise.

## 2018-07-12 NOTE — Progress Notes (Signed)
This service is provided via telemedicine  No vital signs collected/recorded due to the encounter was a telemedicine visit.   Location of patient (ex: home, work):  Home   Patient consents to a telephone visit:  Yes  Location of the provider (ex: office, home): [employer], Office   Names of all persons participating in the telemedicine service and their role in the encounter:  S.Chrae B/CMA, [name], NP, and Patient   Time spent on call:  5 Min with medical assistant  Virtual Visit via Telephone Note  I connected with Kaitlyn Wood on 07/12/18 at  3:30 PM EDT by telephone and verified that I am speaking with the correct person using two identifiers.   I discussed the limitations, risks, security and privacy concerns of performing an evaluation and management service by telephone and the availability of in person appointments. I also discussed with the patient that there may be a patient responsible charge related to this service. The patient expressed understanding and agreed to proceed.     Careteam: Patient Care Team: Lauree Chandler, NP as PCP - General (Geriatric Medicine) Brien Few, MD as Consulting Physician (Obstetrics and Gynecology) Katy Apo, MD as Consulting Physician (Ophthalmology)  Advanced Directive information    Allergies  Allergen Reactions  . Oxycodone     Migraines  . Statins     "makes my urine turn brown"    Chief Complaint  Patient presents with  . Acute Visit    Patient requesting home health referral for Abilify injection. Tele-visit      HPI: Patient is a 68 y.o. female with hx of bipolor disorder and gets abilify injection at her psychiatrist office.  Her last injection was 06/08/2018 and the psychiatrist office will not allow her to come in due to recent respiratory symptoms. She reports she is still sick. Cough and congested. Worse in the morning and throughout the day improves.  Her last fever 07/10/2018 99.4.   This morning her temperature was 98. 2 She called her insurance company and they suggested that her PCP place an order for home health to have a nurse come out and give her injection in the home.  Her Rx is already at the pharmacy and the pharmacy will deliver her medication.   Review of Systems:  Review of Systems  Constitutional: Positive for malaise/fatigue. Negative for chills and fever.  HENT: Positive for congestion. Negative for sore throat.   Respiratory: Positive for cough (improved). Negative for sputum production, shortness of breath and wheezing.   Cardiovascular: Negative for chest pain and leg swelling.  Psychiatric/Behavioral:       Bipolar disorder, controlled on current regimen    Past Medical History:  Diagnosis Date  . Anxiety   . Bipolar 1 disorder ([place])   . Bulging of cervical intervertebral disc    C2  . Bulging of thoracic intervertebral disc    t4  . Depression   . Diabetes mellitus without complication ([place])   . Hyperlipidemia   . PTSD (post-traumatic stress disorder)    Past Surgical History:  Procedure Laterality Date  . CATARACT EXTRACTION Right 08/11/2017  . CATARACT EXTRACTION Left 09/01/2017  . EYE SURGERY    . HEMICOLECTOMY  1997  . JOINT REPLACEMENT  2013   hip   Social History:   reports that she has never smoked. She has never used smokeless tobacco. She reports that she does not drink alcohol or use drugs.  Family History  Problem Relation Age  of Onset  . Ovarian cancer Mother   . Lymphoma Father   . Colon cancer Sister     Medications: Patient's Medications  New Prescriptions   No medications on file  Previous Medications   ARIPIPRAZOLE ER (ABILIFY MAINTENA) 300 MG PRSY PREFILLED SYRINGE    Inject 300 mg into the muscle every 30 (thirty) days. Last injection 04/06/2018   ASCORBIC ACID (VITAMIN C) 1000 MG TABLET    Take 1,000 mg by mouth daily.   ASTAXANTHIN PO    Take 12 mg by mouth daily.    BARBERRY-OREG GRAPE-GOLDENSEAL  (BERBERINE COMPLEX PO)    Take by mouth. 500 mg 3-4 times daily   BLOOD PRESSURE MONITORING (BLOOD PRESSURE CUFF) MISC    Check blood pressure twice daily   CLONAZEPAM (KLONOPIN) 1 MG TABLET    1 mg at night, 1/2 tablet during the day if needed   GABAPENTIN (NEURONTIN) 300 MG CAPSULE    Take 1 capsule (300 mg total) by mouth 3 (three) times daily.   GLUCOSE BLOOD (ACCU-CHEK GUIDE) TEST STRIP    1 each by Other route 3 (three) times daily. E11.9   LAMOTRIGINE (LAMICTAL) 200 MG TABLET    Take 200 mg by mouth 2 (two) times daily.    LISINOPRIL (PRINIVIL,ZESTRIL) 10 MG TABLET    Take 1 tablet (10 mg total) by mouth daily.   LORATADINE (CLARITIN) 10 MG TABLET    Take 10 mg by mouth daily.   METFORMIN (GLUCOPHAGE) 500 MG TABLET    Take 1 tablet (500 mg total) by mouth 2 (two) times daily with a meal.   TRAZODONE (DESYREL) 50 MG TABLET    Take 50 mg by mouth at bedtime as needed.   Modified Medications   No medications on file  Discontinued Medications   No medications on file     Physical Exam:    Labs reviewed: Basic Metabolic Panel: Recent Labs    09/15/17 0945 01/04/18 1025 03/10/18 1407 04/07/18 1334  NA 140 139 139 136  K 4.6 4.2 4.7 5.4*  CL 103 102 105 104  CO2 26 27 25 22   GLUCOSE 170* 131* 225* 151*  BUN 24 20 20 20   CREATININE 1.04* 1.01* 0.96 0.89  CALCIUM 9.9 9.5 9.2 9.6  TSH 3.34  --   --   --    Liver Function Tests: Recent Labs    09/15/17 0945 01/04/18 1025 04/07/18 1334  AST 15 16 18   ALT 17 17 21   BILITOT 0.4 0.4 0.4  PROT 6.7 6.8 6.8   No results for input(s): LIPASE, AMYLASE in the last 8760 hours. No results for input(s): AMMONIA in the last 8760 hours. CBC: Recent Labs    01/04/18 1025 04/07/18 1334  WBC 5.4 5.1  NEUTROABS 3,397 3,759  HGB 14.1 13.8  HCT 42.1 40.4  MCV 89.6 88.2  PLT 237 221   Lipid Panel: Recent Labs    09/15/17 0945 01/04/18 1025 04/07/18 1334  CHOL 253* 261* 290*  HDL 48* 57 55  LDLCALC 168* 168* 189*  TRIG  209* 202* 246*  CHOLHDL 5.3* 4.6 5.3*   TSH: Recent Labs    09/15/17 0945  TSH 3.34   A1C: Lab Results  Component Value Date   HGBA1C 7.3 (H) 04/07/2018     Assessment/Plan 1. Bipolar affective disorder, current episode hypomanic ([place]) - Ambulatory referral to [facility] for nursing to go out to her home and give Abilify Injection since she has URI and unable  to go to psychiatrist office to have this done  2. Viral upper respiratory tract infection Ongoing, last temperature 07/10/2018, still having mild symptoms and continues to stay at home until she is fever and symptoms free for 72 hours.    Carlos American. Harle Battiest  [facility] [phone]  Follow Up Instructions:    I discussed the assessment and treatment plan with the patient. The patient was provided an opportunity to ask questions and all were answered. The patient agreed with the plan and demonstrated an understanding of the instructions.   The patient was advised to call back or seek an in-person evaluation if the symptoms worsen or if the condition fails to improve as anticipated.  I provided 8 minutes of non-face-to-face time during this encounter.  avs printed and mailed.  Lauree Chandler, NP

## 2018-07-13 ENCOUNTER — Ambulatory Visit: Payer: Self-pay | Admitting: Nurse Practitioner

## 2018-07-19 DIAGNOSIS — F310 Bipolar disorder, current episode hypomanic: Secondary | ICD-10-CM | POA: Diagnosis not present

## 2018-07-19 DIAGNOSIS — R05 Cough: Secondary | ICD-10-CM | POA: Diagnosis not present

## 2018-07-19 DIAGNOSIS — E119 Type 2 diabetes mellitus without complications: Secondary | ICD-10-CM | POA: Diagnosis not present

## 2018-07-19 DIAGNOSIS — R509 Fever, unspecified: Secondary | ICD-10-CM

## 2018-07-19 DIAGNOSIS — F419 Anxiety disorder, unspecified: Secondary | ICD-10-CM | POA: Diagnosis not present

## 2018-07-19 DIAGNOSIS — I10 Essential (primary) hypertension: Secondary | ICD-10-CM | POA: Diagnosis not present

## 2018-07-19 DIAGNOSIS — F4310 Post-traumatic stress disorder, unspecified: Secondary | ICD-10-CM | POA: Diagnosis not present

## 2018-07-19 DIAGNOSIS — M5124 Other intervertebral disc displacement, thoracic region: Secondary | ICD-10-CM | POA: Diagnosis not present

## 2018-07-19 DIAGNOSIS — G8929 Other chronic pain: Secondary | ICD-10-CM | POA: Diagnosis not present

## 2018-07-19 DIAGNOSIS — M5021 Other cervical disc displacement,  high cervical region: Secondary | ICD-10-CM | POA: Diagnosis not present

## 2018-07-25 ENCOUNTER — Encounter: Payer: Self-pay | Admitting: Nurse Practitioner

## 2018-08-03 ENCOUNTER — Telehealth: Payer: Self-pay | Admitting: *Deleted

## 2018-08-03 MED ORDER — ACCU-CHEK AVIVA PLUS W/DEVICE KIT: PACK | 0 refills | Status: DC

## 2018-08-03 MED ORDER — GLUCOSE BLOOD VI STRP: 1.0000 | ORAL_STRIP | Freq: Three times a day (TID) | 1 refills | Status: DC

## 2018-08-03 NOTE — Telephone Encounter (Signed)
Patient called and left message on clinical intake and stated that she would like a new glucose meter rx sent to pharmacy.  I called patient back and LMOM stating for her to call her insurance company and see which glucose meter they will cover and let us know so we can send Rx.   Awaiting call back.

## 2018-08-03 NOTE — Telephone Encounter (Signed)
Patient called back and left message on Clinical intake stating that she is not going to call [employer]. Stated that we just needed to send in a Rx for a meter and the pharmacy will then let us know if it was covered or not.  Rx sent.

## 2018-08-05 ENCOUNTER — Other Ambulatory Visit: Payer: Self-pay | Admitting: *Deleted

## 2018-08-05 MED ORDER — ACCU-CHEK SOFTCLIX LANCETS MISC: 1 refills | Status: DC

## 2018-08-05 MED ORDER — ACCU-CHEK AVIVA VI SOLN: 1 refills | Status: DC

## 2018-08-05 NOTE — Telephone Encounter (Signed)
Patient requested. Faxed to pharmacy.  

## 2018-08-16 ENCOUNTER — Other Ambulatory Visit: Payer: Medicare HMO

## 2018-08-16 ENCOUNTER — Other Ambulatory Visit: Payer: Self-pay

## 2018-08-16 DIAGNOSIS — E785 Hyperlipidemia, unspecified: Secondary | ICD-10-CM | POA: Diagnosis not present

## 2018-08-16 DIAGNOSIS — E1169 Type 2 diabetes mellitus with other specified complication: Secondary | ICD-10-CM

## 2018-08-17 LAB — LIPID PANEL
Cholesterol: 232 mg/dL — ABNORMAL HIGH (ref <200)
HDL: 51 mg/dL (ref >=50)
LDL Cholesterol (Calc): 148 mg/dL (calc) — ABNORMAL HIGH
Non-HDL Cholesterol (Calc): 181 mg/dL (calc) — ABNORMAL HIGH (ref <130)
Total CHOL/HDL Ratio: 4.5 (calc) (ref <5.0)
Triglycerides: 190 mg/dL — ABNORMAL HIGH (ref <150)

## 2018-08-17 LAB — COMPLETE METABOLIC PANEL WITH GFR
AG Ratio: 2.1 (calc) (ref 1.0–2.5)
ALT: 18 U/L (ref 6–29)
AST: 17 U/L (ref 10–35)
Albumin: 4.6 g/dL (ref 3.6–5.1)
Alkaline phosphatase (APISO): 98 U/L (ref 37–153)
BUN: 19 mg/dL (ref 7–25)
CO2: 27 mmol/L (ref 20–32)
Calcium: 9.7 mg/dL (ref 8.6–10.4)
Chloride: 102 mmol/L (ref 98–110)
Creat: 0.94 mg/dL (ref 0.50–0.99)
GFR, Est African American: 72 mL/min/{1.73_m2} (ref >=60)
GFR, Est Non African American: 62 mL/min/{1.73_m2} (ref >=60)
Globulin: 2.2 g/dL (calc) (ref 1.9–3.7)
Glucose, Bld: 160 mg/dL — ABNORMAL HIGH (ref 65–99)
Potassium: 4.7 mmol/L (ref 3.5–5.3)
Sodium: 138 mmol/L (ref 135–146)
Total Bilirubin: 0.3 mg/dL (ref 0.2–1.2)
Total Protein: 6.8 g/dL (ref 6.1–8.1)

## 2018-08-17 LAB — HEMOGLOBIN A1C
Hgb A1c MFr Bld: 6.3 % of total Hgb — ABNORMAL HIGH (ref <5.7)
Mean Plasma Glucose: 134 (calc)
eAG (mmol/L): 7.4 (calc)

## 2018-08-18 ENCOUNTER — Encounter: Payer: Self-pay | Admitting: Nurse Practitioner

## 2018-08-18 ENCOUNTER — Ambulatory Visit (INDEPENDENT_AMBULATORY_CARE_PROVIDER_SITE_OTHER): Payer: Medicare HMO | Admitting: Nurse Practitioner

## 2018-08-18 ENCOUNTER — Ambulatory Visit: Payer: Self-pay | Admitting: Nurse Practitioner

## 2018-08-18 ENCOUNTER — Other Ambulatory Visit: Payer: Self-pay

## 2018-08-18 VITALS — BP 128/84 | HR 89 | Temp 98.9°F | Ht 63.0 in | Wt 198.8 lb

## 2018-08-18 DIAGNOSIS — E875 Hyperkalemia: Secondary | ICD-10-CM

## 2018-08-18 DIAGNOSIS — I10 Essential (primary) hypertension: Secondary | ICD-10-CM

## 2018-08-18 DIAGNOSIS — E785 Hyperlipidemia, unspecified: Secondary | ICD-10-CM | POA: Diagnosis not present

## 2018-08-18 DIAGNOSIS — R197 Diarrhea, unspecified: Secondary | ICD-10-CM

## 2018-08-18 DIAGNOSIS — E1169 Type 2 diabetes mellitus with other specified complication: Secondary | ICD-10-CM | POA: Diagnosis not present

## 2018-08-18 DIAGNOSIS — M545 Low back pain, unspecified: Secondary | ICD-10-CM

## 2018-08-18 DIAGNOSIS — F310 Bipolar disorder, current episode hypomanic: Secondary | ICD-10-CM

## 2018-08-18 DIAGNOSIS — M5124 Other intervertebral disc displacement, thoracic region: Secondary | ICD-10-CM | POA: Diagnosis not present

## 2018-08-18 DIAGNOSIS — F4310 Post-traumatic stress disorder, unspecified: Secondary | ICD-10-CM | POA: Diagnosis not present

## 2018-08-18 DIAGNOSIS — E119 Type 2 diabetes mellitus without complications: Secondary | ICD-10-CM | POA: Diagnosis not present

## 2018-08-18 DIAGNOSIS — F411 Generalized anxiety disorder: Secondary | ICD-10-CM | POA: Diagnosis not present

## 2018-08-18 DIAGNOSIS — F419 Anxiety disorder, unspecified: Secondary | ICD-10-CM | POA: Diagnosis not present

## 2018-08-18 DIAGNOSIS — R509 Fever, unspecified: Secondary | ICD-10-CM | POA: Diagnosis not present

## 2018-08-18 DIAGNOSIS — R05 Cough: Secondary | ICD-10-CM | POA: Diagnosis not present

## 2018-08-18 DIAGNOSIS — M5021 Other cervical disc displacement,  high cervical region: Secondary | ICD-10-CM | POA: Diagnosis not present

## 2018-08-18 DIAGNOSIS — G43809 Other migraine, not intractable, without status migrainosus: Secondary | ICD-10-CM

## 2018-08-18 MED ORDER — LORATADINE 10 MG PO TABS: 10.0000 mg | ORAL_TABLET | Freq: Every day | ORAL | 3 refills | Status: DC

## 2018-08-18 NOTE — Progress Notes (Signed)
Careteam: Patient Care Team: Lauree Chandler, NP as PCP - General (Geriatric Medicine) Brien Few, MD as Consulting Physician (Obstetrics and Gynecology) Katy Apo, MD as Consulting Physician (Ophthalmology)  Advanced Directive information    Allergies  Allergen Reactions  . Oxycodone     Migraines  . Statins     "makes my urine turn brown"    Chief Complaint  Patient presents with  . Medical Management of Chronic Issues    4 month follow-up. Moderate fall risk. Patient c/o urinary concerns. Patient would like right leg examined for abnormal spot.   . Quality Metric Gaps    Discuss need for colonoscopy   . Best Practice Recommendations    Discuss need for eye exam      HPI: Patient is a 68 y.o. female seen in the office today for routine follow up.   Bipolar d/o/anxiety/insomnia/PTSD - MDD much better controlled. she is followed by psych "Dr [facility] [place]" and had changes in medication which as dramatically changed her life.  She is taking wellbutrin sr, klonopin, neurontin, lamictal, and trazodone. She sees a therapist.  Specialist has been given her injection but co-pay has gone up and this is expensive. She has prescription but needs it administered. Having to pay speciality co-pay but does not see psychiatrist except for every 3 months. Had had home health to come out and give injection during [place].    DM - she takes metformin 500 mg twice daily. A1c 6.3% from 7.3% has had a poor diet but has made changes recently. Checking blood sugars at home and will notify if they do not improve. She has not had any numbness/tingling. Seeing eye doctor routinely was told no diabetic changes. Added a supplement to help with control of blood sugar. Started 3 months ago and feels like it has been beneficial.  She reports statin intolerance - urine turns brown  Hyperlipidemia- statin intolerant- urine turns brown. LDL was worse but does not want to start medication, would like  dietary modifications for next 3 months and if not improved will start zetia which she has been told about in the past. LDL now 148 from 189. Reports she will not get to Less than 70. Would like to continue to work on this without medication.   Cervical/lumbar herniated/bulging discs - C2-T4; she reports no significant pain at this time. She has never had sx. Initial injury was falling off porch with whiplash injury many years ago. She had an episode when her leg gave away when she went to get out of her car. Gabapentin helps pain. Needs physical therapy but can not afford to go.   Hyperkalemia- previously using a salt substitute and using a lot of it have stopped now, K 4.7  htn- controlled on lisinopril with low sodium diet.   Needs to have sleep test but has not scheduled.   Had a virus, sore throat, muscle aches, diarrhea symptoms on and off since march. Feels like it was highly possible that she had COVID- "very sick" but did not have blue lips or trouble breathing.  Last fever ~1 weeks ago.   Reports she is having urinary and fecal incontinence. When she goes she GOES. This has been ongoing for about 1 month. States she has hx of moderate prolapse uterus. Following with GYN on June 5th.    Review of Systems:  Review of Systems  Constitutional: Positive for malaise/fatigue. Negative for chills and fever.  HENT: Negative for congestion.   Eyes:  Negative for blurred vision.  Respiratory: Negative for shortness of breath.   Cardiovascular: Negative for chest pain, palpitations and leg swelling.  Gastrointestinal: Negative for abdominal pain and heartburn.  Genitourinary: Negative for dysuria, frequency and urgency.  Musculoskeletal: Positive for back pain and myalgias. Negative for falls and joint pain.  Neurological: Positive for headaches (better). Negative for dizziness and loss of consciousness.  Psychiatric/Behavioral: Negative for memory loss. The patient is nervous/anxious and  has insomnia.        Bipolar with hypomania- following with psych-- mood much better with medication changes.     Past Medical History:  Diagnosis Date  . Anxiety   . Bipolar 1 disorder ([place])   . Bulging of cervical intervertebral disc    C2  . Bulging of thoracic intervertebral disc    t4  . Depression   . Diabetes mellitus without complication ([place])   . Hyperlipidemia   . PTSD (post-traumatic stress disorder)    Past Surgical History:  Procedure Laterality Date  . CATARACT EXTRACTION Right 08/11/2017  . CATARACT EXTRACTION Left 09/01/2017  . EYE SURGERY    . HEMICOLECTOMY  1997  . JOINT REPLACEMENT  2013   hip   Social History:   reports that she has never smoked. She has never used smokeless tobacco. She reports that she does not drink alcohol or use drugs.  Family History  Problem Relation Age of Onset  . Ovarian cancer Mother   . Lymphoma Father   . Colon cancer Sister     Medications: Patient's Medications  New Prescriptions   No medications on file  Previous Medications   ACCU-CHEK SOFTCLIX LANCETS LANCETS    Use to test blood sugar Dx: E11.9   ARIPIPRAZOLE ER (ABILIFY MAINTENA) 300 MG PRSY PREFILLED SYRINGE    Inject 300 mg into the muscle every 30 (thirty) days. Last injection 04/06/2018   ASCORBIC ACID (VITAMIN C) 1000 MG TABLET    Take 1,000 mg by mouth daily.   ASTAXANTHIN PO    Take 12 mg by mouth daily.    BARBERRY-OREG GRAPE-GOLDENSEAL (BERBERINE COMPLEX PO)    Take by mouth. 500 mg 3-4 times daily   BLOOD GLUCOSE CALIBRATION (ACCU-CHEK AVIVA) SOLN    Use with testing blood sugars. DX: E11.9   BLOOD GLUCOSE MONITORING SUPPL (ACCU-CHEK AVIVA PLUS) W/DEVICE KIT    Use to test blood sugar three times daily. Dx: E11.9   BLOOD PRESSURE MONITORING (BLOOD PRESSURE CUFF) MISC    Check blood pressure twice daily   CLONAZEPAM (KLONOPIN) 1 MG TABLET    1 mg at night, 1/2 tablet during the day if needed   GABAPENTIN (NEURONTIN) 300 MG CAPSULE    Take 1 capsule (300  mg total) by mouth 3 (three) times daily.   GLUCOSE BLOOD (ACCU-CHEK AVIVA PLUS) TEST STRIP    Use to test blood sugar three times daily. Dx: E11.9   LAMOTRIGINE (LAMICTAL) 200 MG TABLET    Take 200 mg by mouth 2 (two) times daily.    LISINOPRIL (PRINIVIL,ZESTRIL) 10 MG TABLET    Take 1 tablet (10 mg total) by mouth daily.   LORATADINE (CLARITIN) 10 MG TABLET    Take 10 mg by mouth daily.   METFORMIN (GLUCOPHAGE) 500 MG TABLET    Take 1 tablet (500 mg total) by mouth 2 (two) times daily with a meal.   TRAZODONE (DESYREL) 50 MG TABLET    Take 50 mg by mouth at bedtime as needed.   Modified Medications  No medications on file  Discontinued Medications   No medications on file     Physical Exam:  Vitals:   08/18/18 1040  BP: 128/84  Pulse: 89  Temp: 98.9 F (37.2 C)  TempSrc: Oral  SpO2: 97%  Weight: 198 lb 12.8 oz (90.2 kg)  Height: _0  (1.6 m)   Body mass index is 36.38 kg/m.   Wt Readings from Last 3 Encounters:  08/18/18 198 lb 12.8 oz (90.2 kg)  05/07/18 205 lb 6.4 oz (93.2 kg)  04/19/18 201 lb 3.2 oz (91.3 kg)      Physical Exam Constitutional:      General: She is not in acute distress.    Appearance: She is well-developed. She is not diaphoretic.  HENT:     Head: Normocephalic and atraumatic.     Mouth/Throat:     Pharynx: No oropharyngeal exudate.  Eyes:     Conjunctiva/sclera: Conjunctivae normal.     Pupils: Pupils are equal, round, and reactive to light.  Cardiovascular:     Rate and Rhythm: Normal rate and regular rhythm.     Heart sounds: Normal heart sounds.  Pulmonary:     Effort: Pulmonary effort is normal.     Breath sounds: Normal breath sounds.  Musculoskeletal:        General: No tenderness.  Skin:    General: Skin is warm and dry.     Comments: seborrheic keratosis noted to left inner thigh, pt flakes off during visit.   Neurological:     Mental Status: She is alert.    Labs reviewed: Basic Metabolic Panel: Recent Labs     09/15/17 0945  03/10/18 1407 04/07/18 1334 08/16/18 0946  NA 140   < > 139 136 138  K 4.6   < > 4.7 5.4* 4.7  CL 103   < > 105 104 102  CO2 26   < > _1 GLUCOSE 170*   < > 225* 151* 160*  BUN 24   < > _2 CREATININE 1.04*   < > 0.96 0.89 0.94  CALCIUM 9.9   < > 9.2 9.6 9.7  TSH 3.34  --   --   --   --    < > = values in this interval not displayed.   Liver Function Tests: Recent Labs    01/04/18 1025 04/07/18 1334 08/16/18 0946  AST _3 ALT _4 BILITOT 0.4 0.4 0.3  PROT 6.8 6.8 6.8   No results for input(s): LIPASE, AMYLASE in the last 8760 hours. No results for input(s): AMMONIA in the last 8760 hours. CBC: Recent Labs    01/04/18 1025 04/07/18 1334  WBC 5.4 5.1  NEUTROABS 3,397 3,759  HGB 14.1 13.8  HCT 42.1 40.4  MCV 89.6 88.2  PLT 237 221   Lipid Panel: Recent Labs    01/04/18 1025 04/07/18 1334 08/16/18 0946  CHOL 261* 290* 232*  HDL 57 55 51  LDLCALC 168* 189* 148*  TRIG 202* 246* 190*  CHOLHDL 4.6 5.3* 4.5   TSH: Recent Labs    09/15/17 0945  TSH 3.34   A1C: Lab Results  Component Value Date   HGBA1C 6.3 (H) 08/16/2018     Assessment/Plan 1. Bipolar affective disorder, current episode hypomanic ([place]) Currently well controlled. Getting abilify injection monthly, going to specialist has been costly and home health now doing injections due to [place]. Request we start doing injections in office,  she will provide injections from the Rx given by her psychiatrist. We can do these in office, Will call once home health signs off and once she needs an appt.  2. Essential hypertension Blood pressure stable, continue lisinopril 10 mg daily with diary modifications.  3. Generalized anxiety disorder -stable, current regimen has been effective by psychiatrist, continues to see every 3 months.   4. Other migraine without status migrainosus, not intractable Stable without increase in headaches  5. Pain of lumbar spine  Stable, wanting to do PT however will have to come up with co-payment. Continue current regimen  6. Type 2 diabetes mellitus with other specified complication, without long-term current use of insulin (HCC) Improved with diet modifications, reports she is also taking   7. Hyperlipidemia, unspecified hyperlipidemia type Improved with diet modifications, she does not wish to start medication, aware LDL goal is <70 to decrease risk of MI and/or stroke.  8. Hyperkalemia Stopped using salt sub and now has returned to normal.   9. Diarrhea, unspecified type On and off, encouraged to stop supplement that she has recently started. Encouraged to increase fiber.  Also can use benefiber to help bulk stools.   Next appt: 10/06/2018 Carlos [facility]. [place], [facility] [phone]

## 2018-08-18 NOTE — Patient Instructions (Addendum)
Try benefiber 1-2 times daily to help bulk stools.   Scheduled Toileting for urination.

## 2018-09-09 DIAGNOSIS — Z1231 Encounter for screening mammogram for malignant neoplasm of breast: Secondary | ICD-10-CM | POA: Diagnosis not present

## 2018-09-09 DIAGNOSIS — Z01419 Encounter for gynecological examination (general) (routine) without abnormal findings: Secondary | ICD-10-CM | POA: Diagnosis not present

## 2018-09-16 ENCOUNTER — Telehealth: Payer: Self-pay | Admitting: *Deleted

## 2018-09-16 DIAGNOSIS — R509 Fever, unspecified: Secondary | ICD-10-CM | POA: Diagnosis not present

## 2018-09-16 DIAGNOSIS — E119 Type 2 diabetes mellitus without complications: Secondary | ICD-10-CM | POA: Diagnosis not present

## 2018-09-16 DIAGNOSIS — R05 Cough: Secondary | ICD-10-CM | POA: Diagnosis not present

## 2018-09-16 DIAGNOSIS — I10 Essential (primary) hypertension: Secondary | ICD-10-CM | POA: Diagnosis not present

## 2018-09-16 DIAGNOSIS — F4310 Post-traumatic stress disorder, unspecified: Secondary | ICD-10-CM | POA: Diagnosis not present

## 2018-09-16 DIAGNOSIS — M5021 Other cervical disc displacement,  high cervical region: Secondary | ICD-10-CM | POA: Diagnosis not present

## 2018-09-16 DIAGNOSIS — M5124 Other intervertebral disc displacement, thoracic region: Secondary | ICD-10-CM | POA: Diagnosis not present

## 2018-09-16 DIAGNOSIS — F419 Anxiety disorder, unspecified: Secondary | ICD-10-CM | POA: Diagnosis not present

## 2018-09-16 DIAGNOSIS — F310 Bipolar disorder, current episode hypomanic: Secondary | ICD-10-CM | POA: Diagnosis not present

## 2018-09-16 NOTE — Telephone Encounter (Signed)
[facility] calling stating they will continue care for patient for the next 62mth and will do disease care and monthly injections

## 2018-09-17 DIAGNOSIS — I10 Essential (primary) hypertension: Secondary | ICD-10-CM

## 2018-09-17 DIAGNOSIS — M5124 Other intervertebral disc displacement, thoracic region: Secondary | ICD-10-CM

## 2018-09-17 DIAGNOSIS — F419 Anxiety disorder, unspecified: Secondary | ICD-10-CM | POA: Diagnosis not present

## 2018-09-17 DIAGNOSIS — R05 Cough: Secondary | ICD-10-CM | POA: Diagnosis not present

## 2018-09-17 DIAGNOSIS — F310 Bipolar disorder, current episode hypomanic: Secondary | ICD-10-CM | POA: Diagnosis not present

## 2018-09-17 DIAGNOSIS — M5021 Other cervical disc displacement,  high cervical region: Secondary | ICD-10-CM

## 2018-09-17 DIAGNOSIS — F4310 Post-traumatic stress disorder, unspecified: Secondary | ICD-10-CM | POA: Diagnosis not present

## 2018-09-17 DIAGNOSIS — E119 Type 2 diabetes mellitus without complications: Secondary | ICD-10-CM | POA: Diagnosis not present

## 2018-09-17 NOTE — Telephone Encounter (Signed)
Great - thanks

## 2018-10-05 ENCOUNTER — Telehealth: Payer: Self-pay | Admitting: Pulmonary Disease

## 2018-10-05 DIAGNOSIS — G4733 Obstructive sleep apnea (adult) (pediatric): Secondary | ICD-10-CM

## 2018-10-05 NOTE — Telephone Encounter (Signed)
Call returned to patient, she reports she would like to go ahead to schedule her home sleep study. I made this patient aware I would get this message to our [facility] pool so they could help her with this. Voiced understanding.   Pcc's please schedule this patient a home sleep study. She did request that we give her a call with a estimate of how long it will be for her to get this done. Thanks.

## 2018-10-05 NOTE — Telephone Encounter (Signed)
Order was just placed today.  Looks like it should have been put in at time of OV in January. I have given it to [facility] for precert.

## 2018-10-05 NOTE — Telephone Encounter (Signed)
I have pt scheduled for tomorrow afternoon at 2:30.  Nothing further needed.

## 2018-10-06 ENCOUNTER — Ambulatory Visit (INDEPENDENT_AMBULATORY_CARE_PROVIDER_SITE_OTHER): Payer: Medicare HMO | Admitting: Nurse Practitioner

## 2018-10-06 ENCOUNTER — Encounter: Payer: Self-pay | Admitting: Nurse Practitioner

## 2018-10-06 ENCOUNTER — Ambulatory Visit: Payer: Self-pay

## 2018-10-06 ENCOUNTER — Ambulatory Visit: Payer: Medicare HMO

## 2018-10-06 ENCOUNTER — Other Ambulatory Visit: Payer: Self-pay

## 2018-10-06 VITALS — BP 126/72 | HR 97 | Temp 98.4°F | Ht 63.0 in | Wt 197.0 lb

## 2018-10-06 DIAGNOSIS — Z0000 Encounter for general adult medical examination without abnormal findings: Secondary | ICD-10-CM | POA: Diagnosis not present

## 2018-10-06 DIAGNOSIS — G4733 Obstructive sleep apnea (adult) (pediatric): Secondary | ICD-10-CM

## 2018-10-06 NOTE — Patient Instructions (Signed)
Kaitlyn Wood , Thank you for taking time to come for your Medicare Wellness Visit. I appreciate your ongoing commitment to your health goals. Please review the following plan we discussed and let me know if I can assist you in the future.   Screening recommendations/referrals: Colonoscopy: need to complete for colorectal screening- please complete cologuard Mammogram: up to date  Bone Density: up to date Recommended yearly ophthalmology/optometry visit for glaucoma screening and checkup Recommended yearly dental visit for hygiene and checkup  Vaccinations: Influenza vaccine due 10/2018 Pneumococcal vaccine: up to date  Tdap vaccine: up to date  Shingles vaccine up to date  Advanced directives: to review and complete if able.   Conditions/risks identified: cardiovascular disease.  Next appointment: 1 year.    Preventive Care 49 Years and Older, Female Preventive care refers to lifestyle choices and visits with your health care provider that can promote health and wellness. What does preventive care include?  A yearly physical exam. This is also called an annual well check.  Dental exams once or twice a year.  Routine eye exams. Ask your health care provider how often you should have your eyes checked.  Personal lifestyle choices, including:  Daily care of your teeth and gums.  Regular physical activity.  Eating a healthy diet.  Avoiding tobacco and drug use.  Limiting alcohol use.  Practicing safe sex.  Taking low-dose aspirin every day.  Taking vitamin and mineral supplements as recommended by your health care provider. What happens during an annual well check? The services and screenings done by your health care provider during your annual well check will depend on your age, overall health, lifestyle risk factors, and family history of disease. Counseling  Your health care provider may ask you questions about your:  Alcohol use.  Tobacco use.  Drug use.   Emotional well-being.  Home and relationship well-being.  Sexual activity.  Eating habits.  History of falls.  Memory and ability to understand (cognition).  Work and work Statistician.  Reproductive health. Screening  You may have the following tests or measurements:  Height, weight, and BMI.  Blood pressure.  Lipid and cholesterol levels. These may be checked every 5 years, or more frequently if you are over 7 years old.  Skin check.  Lung cancer screening. You may have this screening every year starting at age 63 if you have a 30-pack-year history of smoking and currently smoke or have quit within the past 15 years.  Fecal occult blood test (FOBT) of the stool. You may have this test every year starting at age 48.  Flexible sigmoidoscopy or colonoscopy. You may have a sigmoidoscopy every 5 years or a colonoscopy every 10 years starting at age 63.  Hepatitis C blood test.  Hepatitis B blood test.  Sexually transmitted disease (STD) testing.  Diabetes screening. This is done by checking your blood sugar (glucose) after you have not eaten for a while (fasting). You may have this done every 1-3 years.  Bone density scan. This is done to screen for osteoporosis. You may have this done starting at age 59.  Mammogram. This may be done every 1-2 years. Talk to your health care provider about how often you should have regular mammograms. Talk with your health care provider about your test results, treatment options, and if necessary, the need for more tests. Vaccines  Your health care provider may recommend certain vaccines, such as:  Influenza vaccine. This is recommended every year.  Tetanus, diphtheria, and acellular pertussis (  Tdap, Td) vaccine. You may need a Td booster every 10 years.  Zoster vaccine. You may need this after age 12.  Pneumococcal 13-valent conjugate (PCV13) vaccine. One dose is recommended after age 22.  Pneumococcal polysaccharide (PPSV23)  vaccine. One dose is recommended after age 83. Talk to your health care provider about which screenings and vaccines you need and how often you need them. This information is not intended to replace advice given to you by your health care provider. Make sure you discuss any questions you have with your health care provider. Document Released: 04/13/2015 Document Revised: 12/05/2015 Document Reviewed: 01/16/2015 Elsevier Interactive Patient Education  2017 [employer] Prevention in the Home Falls can cause injuries. They can happen to people of all ages. There are many things you can do to make your home safe and to help prevent falls. What can I do on the outside of my home?  Regularly fix the edges of walkways and driveways and fix any cracks.  Remove anything that might make you trip as you walk through a door, such as a raised step or threshold.  Trim any bushes or trees on the path to your home.  Use bright outdoor lighting.  Clear any walking paths of anything that might make someone trip, such as rocks or tools.  Regularly check to see if handrails are loose or broken. Make sure that both sides of any steps have handrails.  Any raised decks and porches should have guardrails on the edges.  Have any leaves, snow, or ice cleared regularly.  Use sand or salt on walking paths during winter.  Clean up any spills in your garage right away. This includes oil or grease spills. What can I do in the bathroom?  Use night lights.  Install grab bars by the toilet and in the tub and shower. Do not use towel bars as grab bars.  Use non-skid mats or decals in the tub or shower.  If you need to sit down in the shower, use a plastic, non-slip stool.  Keep the floor dry. Clean up any water that spills on the floor as soon as it happens.  Remove soap buildup in the tub or shower regularly.  Attach bath mats securely with double-sided non-slip rug tape.  Do not have throw rugs  and other things on the floor that can make you trip. What can I do in the bedroom?  Use night lights.  Make sure that you have a light by your bed that is easy to reach.  Do not use any sheets or blankets that are too big for your bed. They should not hang down onto the floor.  Have a firm chair that has side arms. You can use this for support while you get dressed.  Do not have throw rugs and other things on the floor that can make you trip. What can I do in the kitchen?  Clean up any spills right away.  Avoid walking on wet floors.  Keep items that you use a lot in easy-to-reach places.  If you need to reach something above you, use a strong step stool that has a grab bar.  Keep electrical cords out of the way.  Do not use floor polish or wax that makes floors slippery. If you must use wax, use non-skid floor wax.  Do not have throw rugs and other things on the floor that can make you trip. What can I do with my stairs?  Do  not leave any items on the stairs.  Make sure that there are handrails on both sides of the stairs and use them. Fix handrails that are broken or loose. Make sure that handrails are as long as the stairways.  Check any carpeting to make sure that it is firmly attached to the stairs. Fix any carpet that is loose or worn.  Avoid having throw rugs at the top or bottom of the stairs. If you do have throw rugs, attach them to the floor with carpet tape.  Make sure that you have a light switch at the top of the stairs and the bottom of the stairs. If you do not have them, ask someone to add them for you. What else can I do to help prevent falls?  Wear shoes that:  Do not have high heels.  Have rubber bottoms.  Are comfortable and fit you well.  Are closed at the toe. Do not wear sandals.  If you use a stepladder:  Make sure that it is fully opened. Do not climb a closed stepladder.  Make sure that both sides of the stepladder are locked into  place.  Ask someone to hold it for you, if possible.  Clearly mark and make sure that you can see:  Any grab bars or handrails.  First and last steps.  Where the edge of each step is.  Use tools that help you move around (mobility aids) if they are needed. These include:  Canes.  Walkers.  Scooters.  Crutches.  Turn on the lights when you go into a dark area. Replace any light bulbs as soon as they burn out.  Set up your furniture so you have a clear path. Avoid moving your furniture around.  If any of your floors are uneven, fix them.  If there are any pets around you, be aware of where they are.  Review your medicines with your doctor. Some medicines can make you feel dizzy. This can increase your chance of falling. Ask your doctor what other things that you can do to help prevent falls. This information is not intended to replace advice given to you by your health care provider. Make sure you discuss any questions you have with your health care provider. Document Released: 01/11/2009 Document Revised: 08/23/2015 Document Reviewed: 04/21/2014 Elsevier Interactive Patient Education  2017 [employer].

## 2018-10-06 NOTE — Progress Notes (Signed)
Subjective:   Kaitlyn Wood is a 68 y.o. female who presents for Medicare Annual (Subsequent) preventive examination.  Review of Systems:   Cardiac Risk Factors include: advanced age (>21mn, >>39women);diabetes mellitus;obesity (BMI >30kg/m2);family history of premature cardiovascular disease;sedentary lifestyle;dyslipidemia;hypertension     Objective:     Vitals: BP 126/72   Pulse 97   Temp 98.4 F (36.9 C) (Oral)   Ht '5\' 3"'$  (1.6 m)   Wt 197 lb (89.4 kg)   SpO2 97%   BMI 34.90 kg/m   Body mass index is 34.9 kg/m.  Advanced Directives 10/06/2018 08/18/2018 05/07/2018 04/19/2018 04/08/2018 03/10/2018 09/30/2017  Does Patient Have a Medical Advance Directive? No No No Yes No No No  Does patient want to make changes to medical advance directive? - - - Yes (Inpatient - patient requests chaplain consult to change a medical advance directive) - - -  Would patient like information on creating a medical advance directive? No - Patient declined Yes (MAU/Ambulatory/Procedural Areas - Information given) - - - - Yes (MAU/Ambulatory/Procedural Areas - Information given)    Tobacco Social History   Tobacco Use  Smoking Status Never Smoker  Smokeless Tobacco Never Used     Counseling given: Not Answered   Clinical Intake:  Pre-visit preparation completed: Yes  Pain : No/denies pain     BMI - recorded: 34.9 Nutritional Status: BMI > 30  Obese Nutritional Risks: None Diabetes: Yes CBG done?: No Did pt. bring in CBG monitor from home?: No  How often do you need to have someone help you when you read instructions, pamphlets, or other written materials from your doctor or pharmacy?: 1 - Never What is the last grade level you completed in school?: masters degree in human services and counselling        Past Medical History:  Diagnosis Date  . Anxiety   . [place]   . Bulging of cervical intervertebral disc    C2  . Bulging of thoracic intervertebral disc    t4   . Depression   . Diabetes mellitus without complication ([place]   . Hyperlipidemia   . PTSD (post-traumatic stress disorder)    Past Surgical History:  Procedure Laterality Date  . CATARACT EXTRACTION Right 08/11/2017  . CATARACT EXTRACTION Left 09/01/2017  . EYE SURGERY    . HEMICOLECTOMY  1997  . JOINT REPLACEMENT  2013   hip   Family History  Problem Relation Age of Onset  . Ovarian cancer Mother   . Lymphoma Father   . Colon cancer Sister    Social History   Socioeconomic History  . Marital status: Divorced    Spouse name: Not on file  . Number of children: Not on file  . Years of education: Not on file  . Highest education level: Not on file  Occupational History  . Not on file  Social Needs  . Financial resource strain: Not hard at all  . Food insecurity    Worry: Never true    Inability: Never true  . Transportation needs    Medical: No    Non-medical: No  Tobacco Use  . Smoking status: Never Smoker  . Smokeless tobacco: Never Used  Substance and Sexual Activity  . Alcohol use: No  . Drug use: No  . Sexual activity: Not Currently  Lifestyle  . Physical activity    Days per week: 0 days    Minutes per session: 0 min  . Stress: Rather much  Relationships  . Social Herbalist on phone: Once a week    Gets together: Once a week    Attends religious service: Never    Active member of club or organization: No    Attends meetings of clubs or organizations: Never    Relationship status: Divorced  Other Topics Concern  . Not on file  Social History Narrative   Diet? ? Fresh food- very little prepared food- no fast food.       Do you drink/eat things with caffeine?  Yes      Marital status?         Divorced                           What year were you married? 1978      Do you live in a house, apartment, assisted living, condo, trailer, etc.? apartment      Is it one or more stories? Yes      How many persons live in your home? 1      Do  you have any pets in your home? (please list)  1 cat, Polly      Current or past profession: Air traffic controller, sales       Do you exercise?       yes                               Type & how often? 2-3 times a week      Do you have a living will? No      Do you have a DNR form?    no                              If not, do you want to discuss one? yes      Do you have signed POA/HPOA for forms? no    Outpatient Encounter Medications as of 10/06/2018  Medication Sig  . Accu-Chek Softclix Lancets lancets Use to test blood sugar Dx: E11.9  . ARIPiprazole ER (ABILIFY MAINTENA) 300 MG PRSY prefilled syringe Inject 300 mg into the muscle every 30 (thirty) days. Last injection 04/06/2018  . Ascorbic Acid (VITAMIN C) 1000 MG tablet Take 1,000 mg by mouth daily.  . ASTAXANTHIN PO Take 12 mg by mouth daily.   Jolyne Loa Grape-Goldenseal (BERBERINE COMPLEX PO) Take by mouth. 500 mg 3-4 times daily  . Blood Glucose Calibration (ACCU-CHEK AVIVA) SOLN Use with testing blood sugars. DX: E11.9  . Blood Glucose Monitoring Suppl (ACCU-CHEK AVIVA PLUS) w/Device KIT Use to test blood sugar three times daily. Dx: E11.9  . Blood Pressure Monitoring (BLOOD PRESSURE CUFF) MISC Check blood pressure twice daily  . clonazePAM (KLONOPIN) 1 MG tablet 1 mg at night, 1/2 tablet during the day if needed  . gabapentin (NEURONTIN) 300 MG capsule Take 1 capsule (300 mg total) by mouth 3 (three) times daily.  Marland Kitchen glucose blood (ACCU-CHEK AVIVA PLUS) test strip Use to test blood sugar three times daily. Dx: E11.9  . lamoTRIgine (LAMICTAL) 200 MG tablet Take 200 mg by mouth 2 (two) times daily.   Marland Kitchen lisinopril (PRINIVIL,ZESTRIL) 10 MG tablet Take 1 tablet (10 mg total) by mouth daily.  Marland Kitchen loratadine (CLARITIN) 10 MG tablet Take 1 tablet (10 mg total) by mouth daily.  . metFORMIN (GLUCOPHAGE) 500  MG tablet Take 1 tablet (500 mg total) by mouth 2 (two) times daily with a meal.  . traZODone (DESYREL) 50 MG tablet Take 50 mg  by mouth at bedtime as needed.    No facility-administered encounter medications on file as of 10/06/2018.     Activities of Daily Living In your present state of health, do you have any difficulty performing the following activities: 10/06/2018  Hearing? N  Vision? N  Difficulty concentrating or making decisions? N  Walking or climbing stairs? N  Dressing or bathing? N  Doing errands, shopping? N  Preparing Food and eating ? N  Using the Toilet? N  In the past six months, have you accidently leaked urine? Y  Do you have problems with loss of bowel control? N  Managing your Medications? N  Managing your Finances? N  Housekeeping or managing your Housekeeping? Y  Comment lack of motivation  Some recent data might be hidden    Patient Care Team: Lauree Chandler, NP as PCP - General (Geriatric Medicine) Brien Few, MD as Consulting Physician (Obstetrics and Gynecology) Katy Apo, MD as Consulting Physician (Ophthalmology)    Assessment:   This is a routine wellness examination for Kaitlyn Wood.  Exercise Activities and Dietary recommendations Current Exercise Habits: The patient does not participate in regular exercise at present  Goals    . Weight (lb) < 150 lb (68 kg) (pt-stated)     Weight loss due to eating healthy and exercise.        Fall Risk Fall Risk  10/06/2018 08/18/2018 07/12/2018 06/28/2018 05/07/2018  Falls in the past year? 0 _0 0  Number falls in past yr: 0 _1 0  Comment - - - 4-5 -  Injury with Fall? 0 0 0 0 0  Comment - - - - -  Risk for fall due to : - History of fall(s) - - -   Is the patient's home free of loose throw rugs in walkways, pet beds, electrical cords, etc?   yes      Grab bars in the bathroom? yes      Handrails on the stairs?   yes      Adequate lighting?   yes  Timed Get Up and Go performed: na  Depression Screen PHQ 2/9 Scores 10/06/2018 02/18/2018 09/30/2017 01/06/2017  PHQ - 2 Score - 0 6 6  PHQ- 9 Score - - 15 18  Exception  Documentation Other- indicate reason in comment box - - -  Not completed Under the care of specialist - - -     Cognitive Function MMSE - Mini Mental State Exam 10/06/2018 09/30/2017 04/04/2016  Orientation to time _2 Orientation to Place _3 Registration _4 Attention/ Calculation _5 Recall _6 Language- name 2 objects _7 Language- repeat _8 Language- follow 3 step command _9 Language- read & follow direction _10 Write a sentence _11 Copy design 1 0 1  Total score _12 Immunization History  Administered Date(s) Administered  . Influenza,inj,Quad PF,6+ Mos 04/04/2016  . Influenza-Unspecified 12/29/2013, 12/14/2017  . Pneumococcal Conjugate-13 01/04/2016  . Pneumococcal Polysaccharide-23 09/30/2017  . Tdap 09/29/2015  . Zoster Recombinat (Shingrix) 12/05/2017, 06/03/2018    Qualifies for Shingles Vaccine?yes  Screening Tests Health Maintenance  Topic Date Due  .  COLONOSCOPY  05/01/1999  . OPHTHALMOLOGY EXAM  05/25/2018  . INFLUENZA VACCINE  10/30/2018  . FOOT EXAM  01/06/2019  . HEMOGLOBIN A1C  02/16/2019  . MAMMOGRAM  05/02/2019  . TETANUS/TDAP  09/28/2025  . DEXA SCAN  Completed  . Hepatitis C Screening  Completed  . PNA vac Low Risk Adult  Completed    Cancer Screenings: Lung: Low Dose CT Chest recommended if Age 72-80 years, 30 pack-year currently smoking OR have quit w/in 15years. Patient does not qualify. Breast:  Up to date on Mammogram? Yes   Up to date of Bone Density/Dexa? Yes Colorectal: declines colonoscopy, has cologuard   Additional Screenings: Hepatitis C Screening: done     Plan:      I have personally reviewed and noted the following in the patient's chart:   . Medical and social history . Use of alcohol, tobacco or illicit drugs  . Current medications and supplements . Functional ability and status . Nutritional status . Physical activity . Advanced directives . List of other physicians .  Hospitalizations, surgeries, and ER visits in previous 12 months . Vitals . Screenings to include cognitive, depression, and falls . Referrals and appointments  In addition, I have reviewed and discussed with patient certain preventive protocols, quality metrics, and best practice recommendations. A written personalized care plan for preventive services as well as general preventive health recommendations were provided to patient.     Lauree Chandler, NP  10/06/2018

## 2018-10-07 DIAGNOSIS — G4733 Obstructive sleep apnea (adult) (pediatric): Secondary | ICD-10-CM | POA: Diagnosis not present

## 2018-10-08 ENCOUNTER — Telehealth: Payer: Self-pay

## 2018-10-08 ENCOUNTER — Encounter: Payer: Self-pay | Admitting: Pulmonary Disease

## 2018-10-08 DIAGNOSIS — G4733 Obstructive sleep apnea (adult) (pediatric): Secondary | ICD-10-CM | POA: Diagnosis not present

## 2018-10-08 DIAGNOSIS — Z87898 Personal history of other specified conditions: Secondary | ICD-10-CM

## 2018-10-08 NOTE — Telephone Encounter (Signed)
Home sleep test impression and recommendations by Dr. Ander Slade: Mild obstructive sleep apnea and mild oxygen desaturations.   Recommendations:  Recommend CPAP therapy for mild obstructive sleep apnea with significant daytime symptoms Auto titrating CPAP with compliance monitoring to optimize therapeutic efficiency Weight loss measures encouraged She should be cautioned against driving when sleepy and against medications with sedative side effects.    Contacted patient and reviewed the above information.  Patient agrees to proceed with Cpap order and has no specific DME request.  Recall appointment reminder placed for early October and asked patient to call for appt if she does not receive a reminder by mid September.  Patient acknowledged understanding.  Order placed and nothing further needed today.

## 2018-10-15 ENCOUNTER — Telehealth: Payer: Self-pay | Admitting: *Deleted

## 2018-10-15 MED ORDER — ACCU-CHEK GUIDE W/DEVICE KIT: 1.0000 | PACK | Freq: Three times a day (TID) | 0 refills | Status: DC

## 2018-10-15 MED ORDER — ACCU-CHEK FASTCLIX LANCETS MISC: 3 refills | Status: DC

## 2018-10-15 MED ORDER — ACCU-CHEK GUIDE VI STRP: ORAL_STRIP | 3 refills | Status: DC

## 2018-10-15 NOTE — Telephone Encounter (Signed)
Patient called and stated that her blood sugar machine does not work properly and requesting a new one, AccuChek Guide.   Medication list updated and Rx sent to pharmacy.

## 2018-10-18 DIAGNOSIS — F310 Bipolar disorder, current episode hypomanic: Secondary | ICD-10-CM | POA: Diagnosis not present

## 2018-10-18 DIAGNOSIS — F419 Anxiety disorder, unspecified: Secondary | ICD-10-CM | POA: Diagnosis not present

## 2018-10-18 DIAGNOSIS — M5124 Other intervertebral disc displacement, thoracic region: Secondary | ICD-10-CM | POA: Diagnosis not present

## 2018-10-18 DIAGNOSIS — R05 Cough: Secondary | ICD-10-CM | POA: Diagnosis not present

## 2018-10-18 DIAGNOSIS — I10 Essential (primary) hypertension: Secondary | ICD-10-CM | POA: Diagnosis not present

## 2018-10-18 DIAGNOSIS — R509 Fever, unspecified: Secondary | ICD-10-CM | POA: Diagnosis not present

## 2018-10-18 DIAGNOSIS — M5021 Other cervical disc displacement,  high cervical region: Secondary | ICD-10-CM | POA: Diagnosis not present

## 2018-10-18 DIAGNOSIS — F4310 Post-traumatic stress disorder, unspecified: Secondary | ICD-10-CM | POA: Diagnosis not present

## 2018-10-18 DIAGNOSIS — E119 Type 2 diabetes mellitus without complications: Secondary | ICD-10-CM | POA: Diagnosis not present

## 2018-10-19 DIAGNOSIS — Z1212 Encounter for screening for malignant neoplasm of rectum: Secondary | ICD-10-CM | POA: Diagnosis not present

## 2018-10-19 DIAGNOSIS — Z1211 Encounter for screening for malignant neoplasm of colon: Secondary | ICD-10-CM | POA: Diagnosis not present

## 2018-10-22 DIAGNOSIS — Z961 Presence of intraocular lens: Secondary | ICD-10-CM | POA: Diagnosis not present

## 2018-10-22 DIAGNOSIS — H52203 Unspecified astigmatism, bilateral: Secondary | ICD-10-CM | POA: Diagnosis not present

## 2018-10-22 DIAGNOSIS — E119 Type 2 diabetes mellitus without complications: Secondary | ICD-10-CM | POA: Diagnosis not present

## 2018-10-22 LAB — HM DIABETES EYE EXAM

## 2018-10-26 ENCOUNTER — Encounter: Payer: Self-pay | Admitting: *Deleted

## 2018-10-26 LAB — COLOGUARD: Cologuard: NEGATIVE

## 2018-10-27 ENCOUNTER — Telehealth: Payer: Self-pay

## 2018-10-27 NOTE — Telephone Encounter (Signed)
Incoming results received from [employer] [facility]. Negative Cologuard results  Patient aware of results and verbalized understanding

## 2018-11-03 DIAGNOSIS — G4733 Obstructive sleep apnea (adult) (pediatric): Secondary | ICD-10-CM | POA: Diagnosis not present

## 2018-11-12 NOTE — Procedures (Signed)
POLYSOMNOGRAPHY  Last, First: Kaitlyn Wood MRN: [id number] Gender: Female Age (years): 68 Weight (lbs): 273 DOB: 09/01/1989 BMI: 47 Primary Care: No PCP Epworth Score: 6 Referring: Laurin Coder MD Technician: Peak, Robert Interpreting: Laurin Coder MD Study Type: NPSG Ordered Study Type: NPSG Study date: 11/03/2018 Location: [facility] CLINICAL INFORMATION Kaitlyn Wood is a 68 year old Female and was referred to the sleep center for evaluation of G47.30 Sleep Apnea, Unspecified (780.57). Indications include N/A.  MEDICATIONS Patient self administered medications include: N/A. Medications administered during study include Sleep medicine administered - Tylenol PM at 08:42:36 PM  SLEEP STUDY TECHNIQUE A multi-channel overnight Polysomnography study was performed. The channels recorded and monitored were central and occipital EEG, electrooculogram (EOG), submentalis EMG (chin), nasal and oral airflow, thoracic and abdominal wall motion, anterior tibialis EMG, snore microphone, electrocardiogram, and a pulse oximetry. TECHNICIAN COMMENTS Comments added by Technician: Patient had more than two awakenings to use the bathroom Comments added by Scorer: N/A SLEEP ARCHITECTURE The study was initiated at 9:41:26 PM and terminated at 4:22:12 AM. The total recorded time was 400.8 minutes. EEG confirmed total sleep time was 213.5 minutes yielding a sleep efficiency of 53.3%%. Sleep onset after lights out was 27.7 minutes with a REM latency of 292.5 minutes. The patient spent 11.0%% of the night in stage N1 sleep, 66.5%% in stage N2 sleep, 17.1%% in stage N3 and 5.4% in REM. Wake after sleep onset (WASO) was 159.6 minutes. The Arousal Index was 23.9/hour. RESPIRATORY PARAMETERS There were a total of 10 respiratory disturbances out of which 0 were apneas ( 0 obstructive, 0 mixed, 0 central) and 10 hypopneas. The apnea/hypopnea index (AHI) was 2.8 events/hour. The central sleep apnea  index was 0.0 events/hour. The REM AHI was 31.3 events/hour and NREM AHI was 1.2 events/hour. The supine AHI was 5.6 events/hour and the non supine AHI was 2.5 supine during 10.07% of sleep. Respiratory disturbances were associated with oxygen desaturation down to a nadir of 88.0% during sleep. The mean oxygen saturation during the study was 94.2%. The cumulative time under 88% oxygen saturation was 5.5 minutes.  LEG MOVEMENT DATA The total leg movements were 0 with a resulting leg movement index of 0.0/hr .Associated arousal with leg movement index was 0.0/hr.  CARDIAC DATA The underlying cardiac rhythm was most consistent with sinus rhythm. Mean heart rate during sleep was 85.1 bpm. Additional rhythm abnormalities include None.   IMPRESSIONS - No Significant Obstructive Sleep apnea(OSA) - EKG showed no cardiac abnormalities. - Mild Oxygen Desaturation - The patient snored with moderate snoring volume. - No significant periodic leg movements(PLMs) during sleep. However, no significant associated arousals.   DIAGNOSIS - Mild nocturnal Hypoxemia (327.26 [G47.36 ICD-10]) - Negative for significant sleep disordered breathing - Multiple awakenings  - Snoring   RECOMMENDATIONS - Avoid alcohol, sedatives and other CNS depressants that may worsen sleep apnea and disrupt normal sleep architecture. - Sleep position modification may help snoring. - Sleep hygiene should be reviewed to assess factors that may improve sleep quality. - Weight management and regular exercise should be initiated or continued.  [Electronically signed] 11/12/2018 05:49 AM  Sherrilyn Rist MD NPI: [id number]

## 2018-11-16 DIAGNOSIS — M5124 Other intervertebral disc displacement, thoracic region: Secondary | ICD-10-CM

## 2018-11-16 DIAGNOSIS — F419 Anxiety disorder, unspecified: Secondary | ICD-10-CM | POA: Diagnosis not present

## 2018-11-16 DIAGNOSIS — F310 Bipolar disorder, current episode hypomanic: Secondary | ICD-10-CM | POA: Diagnosis not present

## 2018-11-16 DIAGNOSIS — F4310 Post-traumatic stress disorder, unspecified: Secondary | ICD-10-CM | POA: Diagnosis not present

## 2018-11-16 DIAGNOSIS — G8929 Other chronic pain: Secondary | ICD-10-CM | POA: Diagnosis not present

## 2018-11-16 DIAGNOSIS — I10 Essential (primary) hypertension: Secondary | ICD-10-CM

## 2018-11-16 DIAGNOSIS — M5021 Other cervical disc displacement,  high cervical region: Secondary | ICD-10-CM

## 2018-11-16 DIAGNOSIS — E119 Type 2 diabetes mellitus without complications: Secondary | ICD-10-CM | POA: Diagnosis not present

## 2018-11-16 DIAGNOSIS — M25561 Pain in right knee: Secondary | ICD-10-CM

## 2018-11-24 ENCOUNTER — Telehealth: Payer: Self-pay | Admitting: Pulmonary Disease

## 2018-11-24 NOTE — Telephone Encounter (Signed)
Patient set up with CPAP on 11/03/18 appt set up with TP on 12/09/18. Patient will bring in machine with her to appt. Nothing further needed at this time.

## 2018-11-30 ENCOUNTER — Other Ambulatory Visit: Payer: Self-pay | Admitting: Nurse Practitioner

## 2018-12-04 DIAGNOSIS — G4733 Obstructive sleep apnea (adult) (pediatric): Secondary | ICD-10-CM | POA: Diagnosis not present

## 2018-12-09 ENCOUNTER — Ambulatory Visit: Payer: Medicare HMO | Admitting: Adult Health

## 2018-12-14 ENCOUNTER — Encounter: Payer: Self-pay | Admitting: Adult Health

## 2018-12-14 ENCOUNTER — Ambulatory Visit: Payer: Medicare HMO | Admitting: Adult Health

## 2018-12-14 ENCOUNTER — Other Ambulatory Visit: Payer: Self-pay

## 2018-12-14 DIAGNOSIS — E669 Obesity, unspecified: Secondary | ICD-10-CM

## 2018-12-14 DIAGNOSIS — G4733 Obstructive sleep apnea (adult) (pediatric): Secondary | ICD-10-CM | POA: Diagnosis not present

## 2018-12-14 DIAGNOSIS — E668 Other obesity: Secondary | ICD-10-CM | POA: Insufficient documentation

## 2018-12-14 NOTE — Patient Instructions (Signed)
Keep up good work  Continue on CPAP At bedtime   Work on healthy weight  Do not drive if sleepy  Follow up with Dr. Hermina Staggers or Radames Mejorado NP in 4-6 months and As needed

## 2018-12-14 NOTE — Assessment & Plan Note (Signed)
Healthy weight loss encouraged 

## 2018-12-14 NOTE — Progress Notes (Signed)
_0  ID: Kaitlyn Wood, female    DOB: 1951/01/03, 68 y.o.   MRN: [id number]  Chief Complaint  Patient presents with  . Follow-up    OSA     Referring provider: Lauree Chandler, NP  HPI: 68 year old female seen for sleep consult April 12, 2018 found to have mild sleep apnea  TEST/EVENTS :  Home sleep study 09/2018 mild sleep apnea AHI 8.5/hour  12/14/2018 Follow up : OSA  Patient returns for a follow-up visit for sleep apnea.  Patient was seen for a sleep consult in January 2020.  She underwent a home sleep study July 2020 that failed she had mild sleep apnea with a AHI of 8.5/hour.  Patient was started on nocturnal CPAP.  Patient says she is getting used to her CPAP.  She is starting to like it.  Says that she does feel that she has improved sleep.  Does not feel as tired or have as much daytime sleepiness.  She wears her CPAP every night.  Gets in about 7 hours.  CPAP download shows excellent compliance with 100% usage.  Daily average usage at 7.5 hours.  AHI of 1.2.    Allergies  Allergen Reactions  . Oxycodone     Migraines  . Statins     "makes my urine turn brown"    Immunization History  Administered Date(s) Administered  . Influenza,inj,Quad PF,6+ Mos 04/04/2016  . Influenza-Unspecified 12/29/2013, 12/14/2017  . Pneumococcal Conjugate-13 01/04/2016  . Pneumococcal Polysaccharide-23 09/30/2017  . Tdap 09/29/2015  . Zoster Recombinat (Shingrix) 12/05/2017, 06/03/2018    Past Medical History:  Diagnosis Date  . Anxiety   . Bipolar 1 disorder ([place])   . Bulging of cervical intervertebral disc    C2  . Bulging of thoracic intervertebral disc    t4  . Depression   . Diabetes mellitus without complication ([place])   . Hyperlipidemia   . PTSD (post-traumatic stress disorder)     Tobacco History: Social History   Tobacco Use  Smoking Status Never Smoker  Smokeless Tobacco Never Used   Counseling given: Not Answered   Outpatient Medications Prior to  Visit  Medication Sig Dispense Refill  . Accu-Chek FastClix Lancets MISC USE TO TEST BLOOD SUGAR THREE TIMES DAILY 306 each 11  . ARIPiprazole ER (ABILIFY MAINTENA) 300 MG PRSY prefilled syringe Inject 300 mg into the muscle every 30 (thirty) days. Last injection 04/06/2018    . Ascorbic Acid (VITAMIN C) 1000 MG tablet Take 1,000 mg by mouth daily.    . ASTAXANTHIN PO Take 12 mg by mouth daily.     Jolyne Loa Grape-Goldenseal (BERBERINE COMPLEX PO) Take by mouth. 500 mg 3-4 times daily    . Blood Glucose Calibration (ACCU-CHEK GUIDE CONTROL VI) Use with testing blood sugars. Dx: E11.9    . Blood Glucose Monitoring Suppl (ACCU-CHEK GUIDE) w/Device KIT 1 each by Does not apply route 3 (three) times daily. Use to test blood sugar three times daily. Dx: E11.9 1 kit 0  . Blood Pressure Monitoring (BLOOD PRESSURE CUFF) MISC Check blood pressure twice daily 1 each 0  . clonazePAM (KLONOPIN) 1 MG tablet 1 mg at night, 1/2 tablet during the day if needed    . gabapentin (NEURONTIN) 300 MG capsule Take 1 capsule (300 mg total) by mouth 3 (three) times daily. 270 capsule 3  . glucose blood (ACCU-CHEK GUIDE) test strip Use to test blood sugar three times daily. Dx: E11.9 300 each 3  . lamoTRIgine (LAMICTAL) 200 MG  tablet Take 200 mg by mouth 2 (two) times daily.     Marland Kitchen lisinopril (PRINIVIL,ZESTRIL) 10 MG tablet Take 1 tablet (10 mg total) by mouth daily. 90 tablet 3  . loratadine (CLARITIN) 10 MG tablet Take 1 tablet (10 mg total) by mouth daily. 90 tablet 3  . metFORMIN (GLUCOPHAGE) 500 MG tablet Take 1 tablet (500 mg total) by mouth 2 (two) times daily with a meal. 180 tablet 3  . traZODone (DESYREL) 50 MG tablet Take 50 mg by mouth at bedtime as needed.      No facility-administered medications prior to visit.      Review of Systems:   Constitutional:   No  weight loss, night sweats,  Fevers, chills, fatigue, or  lassitude.  HEENT:   No headaches,  Difficulty swallowing,  Tooth/dental problems,  or  Sore throat,                No sneezing, itching, ear ache, nasal congestion, post nasal drip,   CV:  No chest pain,  Orthopnea, PND, swelling in lower extremities, anasarca, dizziness, palpitations, syncope.   GI  No heartburn, indigestion, abdominal pain, nausea, vomiting, diarrhea, change in bowel habits, loss of appetite, bloody stools.   Resp: No shortness of breath with exertion or at rest.  No excess mucus, no productive cough,  No non-productive cough,  No coughing up of blood.  No change in color of mucus.  No wheezing.  No chest wall deformity  Skin: no rash or lesions.  GU: no dysuria, change in color of urine, no urgency or frequency.  No flank pain, no hematuria   MS:  No joint pain or swelling.  No decreased range of motion.  No back pain.    Physical Exam  BP 134/88 (BP Location: Left Arm, Cuff Size: Normal)   Pulse 84   Temp 98 F (36.7 C) (Oral)   Ht _0  (1.6 m)   Wt 190 lb 3.2 oz (86.3 kg)   SpO2 100%   BMI 33.69 kg/m   GEN: A/Ox3; pleasant , NAD, obese    HEENT:  [place]/AT,   , NOSE-clear, THROAT-clear, no lesions, no postnasal drip or exudate noted. Class 2-3 MP airway   NECK:  Supple w/ fair ROM; no JVD; normal carotid impulses w/o bruits; no thyromegaly or nodules palpated; no lymphadenopathy.    RESP  Clear  P & A; w/o, wheezes/ rales/ or rhonchi. no accessory muscle use, no dullness to percussion  CARD:  RRR, no m/r/g, no peripheral edema, pulses intact, no cyanosis or clubbing.  GI:   Soft & nt; nml bowel sounds; no organomegaly or masses detected.   Musco: Warm bil, no deformities or joint swelling noted.   Neuro: alert, no focal deficits noted.    Skin: Warm, no lesions or rashes    Lab Results:  CBC   BNP No results found for: BNP  ProBNP No results found for: PROBNP  Imaging: No results found.    No flowsheet data found.  No results found for: NITRICOXIDE      Assessment & Plan:   OSA (obstructive sleep apnea)  Mild obstructive sleep apnea with excellent control and compliance on nocturnal CPAP  Plan  Patient Instructions  Keep up good work  Continue on CPAP At bedtime   Work on healthy weight  Do not drive if sleepy  Follow up with Dr. Hermina Staggers or Mariesha Venturella NP in 4-6 months and As needed  Moderate obesity Healthy weight loss encouraged     Rexene Edison, NP 12/14/2018

## 2018-12-14 NOTE — Assessment & Plan Note (Signed)
Mild obstructive sleep apnea with excellent control and compliance on nocturnal CPAP  Plan  Patient Instructions  Keep up good work  Continue on CPAP At bedtime   Work on healthy weight  Do not drive if sleepy  Follow up with Dr. Hermina Staggers or Ady Heimann NP in 4-6 months and As needed

## 2018-12-15 DIAGNOSIS — G8929 Other chronic pain: Secondary | ICD-10-CM | POA: Diagnosis not present

## 2018-12-15 DIAGNOSIS — F310 Bipolar disorder, current episode hypomanic: Secondary | ICD-10-CM | POA: Diagnosis not present

## 2018-12-15 DIAGNOSIS — I10 Essential (primary) hypertension: Secondary | ICD-10-CM | POA: Diagnosis not present

## 2018-12-15 DIAGNOSIS — M5124 Other intervertebral disc displacement, thoracic region: Secondary | ICD-10-CM | POA: Diagnosis not present

## 2018-12-15 DIAGNOSIS — F4310 Post-traumatic stress disorder, unspecified: Secondary | ICD-10-CM | POA: Diagnosis not present

## 2018-12-15 DIAGNOSIS — M5021 Other cervical disc displacement,  high cervical region: Secondary | ICD-10-CM | POA: Diagnosis not present

## 2018-12-15 DIAGNOSIS — E119 Type 2 diabetes mellitus without complications: Secondary | ICD-10-CM | POA: Diagnosis not present

## 2018-12-15 DIAGNOSIS — F419 Anxiety disorder, unspecified: Secondary | ICD-10-CM | POA: Diagnosis not present

## 2018-12-15 DIAGNOSIS — M25561 Pain in right knee: Secondary | ICD-10-CM | POA: Diagnosis not present

## 2018-12-21 ENCOUNTER — Ambulatory Visit: Payer: Medicare HMO | Admitting: Nurse Practitioner

## 2018-12-21 ENCOUNTER — Other Ambulatory Visit: Payer: Self-pay

## 2018-12-21 ENCOUNTER — Ambulatory Visit (INDEPENDENT_AMBULATORY_CARE_PROVIDER_SITE_OTHER): Payer: Medicare HMO | Admitting: Family

## 2018-12-21 ENCOUNTER — Encounter: Payer: Self-pay | Admitting: Family

## 2018-12-21 VITALS — BP 142/80 | HR 85 | Temp 97.3°F | Ht 63.0 in | Wt 187.8 lb

## 2018-12-21 DIAGNOSIS — E1169 Type 2 diabetes mellitus with other specified complication: Secondary | ICD-10-CM | POA: Diagnosis not present

## 2018-12-21 DIAGNOSIS — J302 Other seasonal allergic rhinitis: Secondary | ICD-10-CM | POA: Diagnosis not present

## 2018-12-21 DIAGNOSIS — Z23 Encounter for immunization: Secondary | ICD-10-CM | POA: Diagnosis not present

## 2018-12-21 DIAGNOSIS — E785 Hyperlipidemia, unspecified: Secondary | ICD-10-CM | POA: Diagnosis not present

## 2018-12-21 DIAGNOSIS — F319 Bipolar disorder, unspecified: Secondary | ICD-10-CM | POA: Diagnosis not present

## 2018-12-21 DIAGNOSIS — I10 Essential (primary) hypertension: Secondary | ICD-10-CM | POA: Diagnosis not present

## 2018-12-21 MED ORDER — METFORMIN HCL ER (OSM) 1000 MG PO TB24: 1000.0000 mg | ORAL_TABLET | Freq: Every day | ORAL | 3 refills | Status: DC

## 2018-12-21 NOTE — Progress Notes (Signed)
Provider: Dinah Ngetich FNP-C   Lauree Chandler, NP  Patient Care Team: Lauree Chandler, NP as PCP - General (Geriatric Medicine) Brien Few, MD as Consulting Physician (Obstetrics and Gynecology) Katy Apo, MD as Consulting Physician (Ophthalmology)  Extended Emergency Contact Information Primary Emergency Contact: [place], [place] Phone: [phone] Relation: Son  Code Status: Full Code  Goals of care: Advanced Directive information Advanced Directives 10/06/2018  Does Patient Have a Medical Advance Directive? No  Does patient want to make changes to medical advance directive? -  Would patient like information on creating a medical advance directive? No - Patient declined     Chief Complaint  Patient presents with  . Medical Management of Chronic Issues    4 month follow up patient states she would new prescription for metformin er   . Quality Metric Gaps    flu shot     HPI:  Pt is a 68 y.o. female seen today for medical management of chronic diseases.she denies any acute issues during visit.  Type 2 DM - reports CBG 110's-180's she has modified her diet and  Exercise by doing body weight resistance exercises,stretching and walks around the apartments.she has seen an Ophthalmologist was told no changes in the the eyes. On Metformin 500 mg tablet twice daily.she would like to switch to an extended release because she forgets to take medication then her CBG goes up.   Hypertension - states readings are high at home due to anxiety.some readings in the 150's/90's.currently on lisinopril 10 mg tablet daily.  Bipolar - follows up with Psychiatry.stable on Abilify 300 mg tablet every 30 days injection and Lamictal 200 mg tablet twice daily.  GAD - continues to have anxiety takes clonopin 1 mg tablet daily at bedtime and has 0.5 mg tablet as needed during the day.she states does not like to take it during the  day because it makes drowsy.No fall episodes.      Allergies- loratadine 10 mg tablet daily effective. States using C-Pap machine at night  has also cleared her sinuses.  Sleep Apnea - C-Pap machine at night.states resting more.7-8 hrs at night.she states has sleeping has improved except when the C-pap machine wakes her up.sometimes the tubing fills up with bubbling water but has been adjusting the humidify.  She had 10/19/2018 cologuard which was negative.  Past Medical History:  Diagnosis Date  . Anxiety   . Bipolar 1 disorder ([place])   . Bulging of cervical intervertebral disc    C2  . Bulging of thoracic intervertebral disc    t4  . Depression   . Diabetes mellitus without complication ([place])   . Hyperlipidemia   . PTSD (post-traumatic stress disorder)    Past Surgical History:  Procedure Laterality Date  . CATARACT EXTRACTION Right 08/11/2017  . CATARACT EXTRACTION Left 09/01/2017  . EYE SURGERY    . HEMICOLECTOMY  1997  . JOINT REPLACEMENT  2013   hip    Allergies  Allergen Reactions  . Oxycodone     Migraines  . Statins     "makes my urine turn brown"    Allergies as of 12/21/2018      Reactions   Oxycodone    Migraines   Statins    "makes my urine turn brown"      Medication List       Accurate as of December 21, 2018  1:26 PM. If  you have any questions, ask your nurse or doctor.        Accu-Chek FastClix Lancets Misc USE TO TEST BLOOD SUGAR THREE TIMES DAILY   ACCU-CHEK GUIDE CONTROL VI Use with testing blood sugars. Dx: E11.9   Accu-Chek Guide test strip Generic drug: glucose blood Use to test blood sugar three times daily. Dx: E11.9   Accu-Chek Guide w/Device Kit 1 each by Does not apply route 3 (three) times daily. Use to test blood sugar three times daily. Dx: E11.9   ARIPiprazole ER 300 MG Prsy prefilled syringe Commonly known as: ABILIFY MAINTENA Inject 300 mg into the muscle every 30 (thirty) days. Last injection 04/06/2018    ASTAXANTHIN PO Take 12 mg by mouth daily.   BERBERINE COMPLEX PO Take by mouth. 500 mg 3-4 times daily   Blood Pressure Cuff Misc Check blood pressure twice daily   clonazePAM 1 MG tablet Commonly known as: KLONOPIN 1 mg at night, 1/2 tablet during the day if needed   gabapentin 300 MG capsule Commonly known as: NEURONTIN Take 1 capsule (300 mg total) by mouth 3 (three) times daily.   lamoTRIgine 200 MG tablet Commonly known as: LAMICTAL Take 200 mg by mouth 2 (two) times daily.   lisinopril 10 MG tablet Commonly known as: ZESTRIL Take 1 tablet (10 mg total) by mouth daily.   loratadine 10 MG tablet Commonly known as: CLARITIN Take 1 tablet (10 mg total) by mouth daily.   metFORMIN 500 MG tablet Commonly known as: GLUCOPHAGE Take 1 tablet (500 mg total) by mouth 2 (two) times daily with a meal.   traZODone 50 MG tablet Commonly known as: DESYREL Take 50 mg by mouth at bedtime as needed.   vitamin C 1000 MG tablet Take 1,000 mg by mouth daily.       Review of Systems  Constitutional: Negative for appetite change, chills, fatigue and fever.  HENT: Negative for congestion, rhinorrhea, sinus pressure, sinus pain, sneezing, sore throat and trouble swallowing.   Eyes: Positive for visual disturbance. Negative for discharge, redness and itching.       Wears eye glasses   Respiratory: Negative for cough, chest tightness, shortness of breath and wheezing.        Uses C-PaP follows up with [facility].  Cardiovascular: Negative for chest pain, palpitations and leg swelling.  Gastrointestinal: Negative for abdominal distention, abdominal pain, constipation, diarrhea, nausea and vomiting.  Endocrine: Negative for cold intolerance, heat intolerance, polydipsia, polyphagia and polyuria.  Genitourinary: Negative for decreased urine volume, difficulty urinating, dysuria, flank pain, frequency and urgency.  Musculoskeletal: Negative for back pain and gait problem.  Skin:  Negative for color change, pallor, rash and wound.  Neurological: Negative for dizziness, weakness, light-headedness, numbness and headaches.  Hematological: Does not bruise/bleed easily.  Psychiatric/Behavioral: Negative for agitation and sleep disturbance. The patient is nervous/anxious.        Sleeps 8-10 hrs     Immunization History  Administered Date(s) Administered  . Influenza,inj,Quad PF,6+ Mos 04/04/2016  . Influenza-Unspecified 12/29/2013, 12/14/2017  . Pneumococcal Conjugate-13 01/04/2016  . Pneumococcal Polysaccharide-23 09/30/2017  . Tdap 09/29/2015  . Zoster Recombinat (Shingrix) 12/05/2017, 06/03/2018   Pertinent  Health Maintenance Due  Topic Date Due  . COLONOSCOPY  05/01/1999  . INFLUENZA VACCINE  10/30/2018  . FOOT EXAM  01/06/2019  . HEMOGLOBIN A1C  02/16/2019  . MAMMOGRAM  05/02/2019  . OPHTHALMOLOGY EXAM  10/22/2019  . DEXA SCAN  Completed  . PNA vac Low Risk Adult  Completed   Fall Risk  10/06/2018 08/18/2018 07/12/2018 06/28/2018 05/07/2018  Falls in the past year? 0 _0 0  Number falls in past yr: 0 _1 0  Comment - - - 4-5 -  Injury with Fall? 0 0 0 0 0  Comment - - - - -  Risk for fall due to : - History of fall(s) - - -    Vitals:   12/21/18 1310  BP: (!) 142/80  Pulse: 85  Temp: (!) 97.3 F (36.3 C)  TempSrc: Temporal  SpO2: 98%  Weight: 187 lb 12.8 oz (85.2 kg)  Height: 5' 3" (1.6 m)   Body mass index is 33.27 kg/m. Physical Exam Vitals signs reviewed.  Constitutional:      General: She is not in acute distress.    Appearance: She is normal weight. She is not ill-appearing.  HENT:     Head: Normocephalic.     Right Ear: Tympanic membrane, ear canal and external ear normal. There is no impacted cerumen.     Left Ear: Tympanic membrane, ear canal and external ear normal. There is no impacted cerumen.     Nose: Nose normal. No congestion or rhinorrhea.     Mouth/Throat:     Mouth: Mucous membranes are moist.     Pharynx: Oropharynx  is clear. No oropharyngeal exudate or posterior oropharyngeal erythema.  Eyes:     General: No scleral icterus.       Right eye: No discharge.        Left eye: No discharge.     Extraocular Movements: Extraocular movements intact.     Conjunctiva/sclera: Conjunctivae normal.     Pupils: Pupils are equal, round, and reactive to light.  Neck:     Musculoskeletal: Normal range of motion. No neck rigidity or muscular tenderness.     Vascular: No carotid bruit.  Cardiovascular:     Rate and Rhythm: Normal rate and regular rhythm.     Pulses: Normal pulses.     Heart sounds: Normal heart sounds. No murmur. No friction rub. No gallop.   Pulmonary:     Effort: Pulmonary effort is normal. No respiratory distress.     Breath sounds: Normal breath sounds. No wheezing, rhonchi or rales.  Chest:     Chest wall: No tenderness.  Abdominal:     General: Bowel sounds are normal. There is no distension.     Palpations: Abdomen is soft. There is no mass.     Tenderness: There is no abdominal tenderness. There is no right CVA tenderness, left CVA tenderness, guarding or rebound.  Musculoskeletal: Normal range of motion.        General: No swelling or tenderness.     Right lower leg: No edema.     Left lower leg: No edema.  Lymphadenopathy:     Cervical: No cervical adenopathy.  Skin:    General: Skin is warm and dry.     Coloration: Skin is not pale.     Findings: No bruising, erythema or rash.  Neurological:     Mental Status: She is alert and oriented to person, place, and time.     Cranial Nerves: No cranial nerve deficit.     Sensory: No sensory deficit.     Motor: No weakness.     Coordination: Coordination normal.     Gait: Gait normal.     Deep Tendon Reflexes: Reflexes normal.  Psychiatric:        Mood  and Affect: Mood normal.        Behavior: Behavior normal.        Thought Content: Thought content normal.        Judgment: Judgment normal.    Labs reviewed: Recent Labs     03/10/18 1407 04/07/18 1334 08/16/18 0946  NA 139 136 138  K 4.7 5.4* 4.7  CL 105 104 102  CO2 _0 GLUCOSE 225* 151* 160*  BUN _1 CREATININE 0.96 0.89 0.94  CALCIUM 9.2 9.6 9.7   Recent Labs    01/04/18 1025 04/07/18 1334 08/16/18 0946  AST _2 ALT _3 BILITOT 0.4 0.4 0.3  PROT 6.8 6.8 6.8   Recent Labs    01/04/18 1025 04/07/18 1334  WBC 5.4 5.1  NEUTROABS 3,397 3,759  HGB 14.1 13.8  HCT 42.1 40.4  MCV 89.6 88.2  PLT 237 221   Lab Results  Component Value Date   TSH 3.34 09/15/2017   Lab Results  Component Value Date   HGBA1C 6.3 (H) 08/16/2018   Lab Results  Component Value Date   CHOL 232 (H) 08/16/2018   HDL 51 08/16/2018   LDLCALC 148 (H) 08/16/2018   TRIG 190 (H) 08/16/2018   CHOLHDL 4.5 08/16/2018    Significant Diagnostic Results in last 30 days:  No results found.  Assessment/Plan 1. Type 2 diabetes mellitus with other specified complication, without long-term current use of insulin (HCC) Recall CBG stable.request metformin to be changed from 500 mg tablet twice daily to extended release.On ACE inhibitor.Not on ASA or statin.she does not like statin.  - Hemoglobin A1c; Future - metformin (FORTAMET) 1000 MG (OSM) 24 hr tablet; Take 1 tablet (1,000 mg total) by mouth daily with breakfast.  Dispense: 30 tablet; Refill: 3  2. Essential hypertension B/p not at goal but states due to anxiety.continue on lisinopril 10 mg tablet daily. - CBC with Differential/Platelet; Future - CMP with eGFR([facility]); Future - TSH; Future  3. Hyperlipidemia associated with type 2 diabetes mellitus ([place]) Has had a weight loss of 3 lbs since previous visit.she continues to modify her diet and trying to increase her walking exercises. - Lipid panel; Future  4. Seasonal allergic rhinitis, unspecified trigger Symptoms under control on loratadine 10 mg tablet daily.  5. Need for influenza vaccination Afebrile.No s/sxs of URI. - Flu Vaccine  QUAD High Dose(Fluad) administered by CMA.  Family/ staff Communication: Reviewed plan of care with patient   Labs/tests ordered:  - CBC with Differential/Platelet; Future - CMP with eGFR([facility]); Future - TSH; Future - Lipid panel; Future - Hemoglobin A1c; Future  Next appointment: 3 months for medical management of chronic issues.   Sandrea Hughs, NP

## 2018-12-22 ENCOUNTER — Encounter: Payer: Self-pay | Admitting: *Deleted

## 2018-12-22 DIAGNOSIS — E1169 Type 2 diabetes mellitus with other specified complication: Secondary | ICD-10-CM

## 2018-12-22 NOTE — Telephone Encounter (Signed)
There is a Metformin 1000mg  ER (Fortamet) OSM Or Metformin 1000mg  ER (Glumetza) Mod  Im not sure which to choose. There is no just regular Metformin ER 1000mg .   Please Advise.

## 2018-12-22 NOTE — Telephone Encounter (Signed)
Metformin 1000 mg Tablet ER

## 2018-12-22 NOTE — Telephone Encounter (Signed)
Forta met

## 2018-12-22 NOTE — Telephone Encounter (Signed)
Insurance sent prior authorization for Metformin Er 1000mg  Osmotic Tablets. Should this be the Osmotic or just Metformin ER 1000mg . In current medication list as Osmotic.  Please Advise.

## 2018-12-23 ENCOUNTER — Encounter: Payer: Self-pay | Admitting: *Deleted

## 2018-12-23 ENCOUNTER — Telehealth: Payer: Self-pay | Admitting: *Deleted

## 2018-12-23 MED ORDER — METFORMIN HCL ER (OSM) 1000 MG PO TB24: 1000.0000 mg | ORAL_TABLET | Freq: Every day | ORAL | 3 refills | Status: DC

## 2018-12-23 NOTE — Progress Notes (Signed)
Disregard "error"  Prior Authorization completed.  PA Case ID: [id number] Sent through CoverMyMeds. Awaiting Determination.

## 2018-12-23 NOTE — Telephone Encounter (Signed)
Disregard "error"  Prior Authorization completed.  PA Case ID: [id number]  Sent through CoverMyMeds. Awaiting Determination.      Documentation        You 3 minutes ago (11:22 AM)   error     Documentation        Ngetich, Dinah C, NP routed conversation to You 19 hours ago (3:46 PM)      Ngetich, Dinah C, NP 19 hours ago (3:46 PM)   Forta met      Documentation        You routed conversation to Ngetich, Dinah C, NP 20 hours ago (2:37 PM)      You 20 hours ago (2:37 PM)   There is a Metformin 1038m ER (Fortamet) OSM  Or  Metformin 10037mER (Glumetza) Mod  Im not sure which to choose. There is no just regular Metformin ER 100033m Please Advise.      Documentation        Ngetich, Dinah C, NP routed conversation to You 21 hours ago (1:51 PM)      Ngetich, Dinah C, NP 21 hours ago (1:51 PM)   Metformin 1000 mg Tablet ER      Documentation        You routed conversation to [employer] Yesterday (11:22 AM)      You Yesterday (11:22 AM)   Insurance sent prior authorization for Metformin Er 1000m68mmotic Tablets. Should this be the Osmotic or just Metformin ER 1000mg37m current medication list as Osmotic. Please Advise.      Documentation

## 2018-12-23 NOTE — Telephone Encounter (Signed)
error 

## 2018-12-28 NOTE — Telephone Encounter (Signed)
Received fax from [facility] [phone] and Metformin ER 1000mg  OSM was DENIED. Placed paperwork in Dinah's folder to review.

## 2018-12-29 ENCOUNTER — Telehealth: Payer: Self-pay | Admitting: Family

## 2018-12-29 MED ORDER — METFORMIN HCL ER (MOD) 1000 MG PO TB24: 1000.0000 mg | ORAL_TABLET | Freq: Every day | ORAL | 3 refills | Status: DC

## 2018-12-29 NOTE — Telephone Encounter (Signed)
Medication list updated and Rx sent to Pharmacy.

## 2018-12-29 NOTE — Telephone Encounter (Signed)
When I add Metformin ER 24hr 1000mg  tablet what comes up is:  Metformin (Glumetza) 1000mg  (MOD) 24hr tab  Metformin (Fortamet) 1000mg  (OSM) 24hr tab  Please Advise.

## 2018-12-29 NOTE — Telephone Encounter (Signed)
Insurance declined Metformin ( OSM) 24 hrs Tablet.send Glumetza.

## 2018-12-29 NOTE — Telephone Encounter (Signed)
Prior Authorization for Metformin ER 1000 mg OSM -Tablet denied by patient's insurance because medication is not on the preferred insurance list.Metformin ER 24 Hr tablet 1000 mg daily recommended.Discontinue  Metformin ER 1000 mg OSM -Tablet then start on Metformin ER 24 Hr tablet 1000 mg daily per insurance recommendation.

## 2018-12-30 NOTE — Addendum Note (Signed)
Addended by: Rafael Bihari A on: 12/30/2018 12:45 PM   Modules accepted: Orders

## 2018-12-30 NOTE — Telephone Encounter (Signed)
Received fax from [facility] [phone] and Metformin Glumetza was not covered.   States the Perferred alternative is Metformin HCL ER, but this medication does not come up in Medications.   Please Advise.

## 2019-01-03 DIAGNOSIS — G4733 Obstructive sleep apnea (adult) (pediatric): Secondary | ICD-10-CM | POA: Diagnosis not present

## 2019-01-14 DIAGNOSIS — F4310 Post-traumatic stress disorder, unspecified: Secondary | ICD-10-CM | POA: Diagnosis not present

## 2019-01-14 DIAGNOSIS — M5021 Other cervical disc displacement,  high cervical region: Secondary | ICD-10-CM | POA: Diagnosis not present

## 2019-01-14 DIAGNOSIS — F310 Bipolar disorder, current episode hypomanic: Secondary | ICD-10-CM | POA: Diagnosis not present

## 2019-01-14 DIAGNOSIS — F419 Anxiety disorder, unspecified: Secondary | ICD-10-CM | POA: Diagnosis not present

## 2019-01-14 DIAGNOSIS — I10 Essential (primary) hypertension: Secondary | ICD-10-CM | POA: Diagnosis not present

## 2019-01-14 DIAGNOSIS — M5124 Other intervertebral disc displacement, thoracic region: Secondary | ICD-10-CM | POA: Diagnosis not present

## 2019-01-14 DIAGNOSIS — E119 Type 2 diabetes mellitus without complications: Secondary | ICD-10-CM | POA: Diagnosis not present

## 2019-01-14 DIAGNOSIS — G8929 Other chronic pain: Secondary | ICD-10-CM | POA: Diagnosis not present

## 2019-01-14 DIAGNOSIS — M25561 Pain in right knee: Secondary | ICD-10-CM | POA: Diagnosis not present

## 2019-01-15 DIAGNOSIS — F430 Acute stress reaction: Secondary | ICD-10-CM | POA: Diagnosis not present

## 2019-01-15 DIAGNOSIS — M5021 Other cervical disc displacement,  high cervical region: Secondary | ICD-10-CM

## 2019-01-15 DIAGNOSIS — F310 Bipolar disorder, current episode hypomanic: Secondary | ICD-10-CM | POA: Diagnosis not present

## 2019-01-15 DIAGNOSIS — E119 Type 2 diabetes mellitus without complications: Secondary | ICD-10-CM | POA: Diagnosis not present

## 2019-01-15 DIAGNOSIS — M5124 Other intervertebral disc displacement, thoracic region: Secondary | ICD-10-CM

## 2019-01-15 DIAGNOSIS — F419 Anxiety disorder, unspecified: Secondary | ICD-10-CM | POA: Diagnosis not present

## 2019-01-15 DIAGNOSIS — M25561 Pain in right knee: Secondary | ICD-10-CM

## 2019-01-15 DIAGNOSIS — I10 Essential (primary) hypertension: Secondary | ICD-10-CM

## 2019-01-17 NOTE — Telephone Encounter (Signed)
Patient is calling for a Rx for the ONE A DAY Metformin. Both the Metformin 1000mg  we have listed in Epic has been DENIED. Please Advise.

## 2019-01-18 ENCOUNTER — Encounter: Payer: Self-pay | Admitting: Family

## 2019-01-18 ENCOUNTER — Other Ambulatory Visit: Payer: Self-pay

## 2019-01-18 ENCOUNTER — Ambulatory Visit (INDEPENDENT_AMBULATORY_CARE_PROVIDER_SITE_OTHER): Payer: Medicare HMO | Admitting: Family

## 2019-01-18 DIAGNOSIS — M25561 Pain in right knee: Secondary | ICD-10-CM | POA: Diagnosis not present

## 2019-01-18 DIAGNOSIS — M542 Cervicalgia: Secondary | ICD-10-CM | POA: Diagnosis not present

## 2019-01-18 DIAGNOSIS — E1169 Type 2 diabetes mellitus with other specified complication: Secondary | ICD-10-CM

## 2019-01-18 DIAGNOSIS — M545 Low back pain, unspecified: Secondary | ICD-10-CM

## 2019-01-18 DIAGNOSIS — G8929 Other chronic pain: Secondary | ICD-10-CM

## 2019-01-18 MED ORDER — METFORMIN HCL 500 MG PO TABS: 500.0000 mg | ORAL_TABLET | Freq: Two times a day (BID) | ORAL | 3 refills | Status: DC

## 2019-01-18 NOTE — Progress Notes (Signed)
This service is provided via telemedicine  No vital signs collected/recorded due to the encounter was a telemedicine visit.   Location of patient (ex: home, work):  Home   Patient consents to a telephone visit:  Yes   Location of the provider (ex: office, home):  Office   Name of any referring provider:  [name], NP  Names of all persons participating in the telemedicine service and their role in the encounter:  Marlowe Sax, NP Ruthell Rummage CMA, Jacqualine Code   Time spent on call:  Ruthell Rummage CMA spent 13 minutes    Provider: Collen Vincent FNP-C  Lauree Chandler, NP  Patient Care Team: Lauree Chandler, NP as PCP - General (Geriatric Medicine) Brien Few, MD as Consulting Physician (Obstetrics and Gynecology) Katy Apo, MD as Consulting Physician (Ophthalmology)  Extended Emergency Contact Information Primary Emergency Contact: Ross, [place] Phone: [phone] Relation: Son  Code Status: Full Code  Goals of care: Advanced Directive information Advanced Directives 10/06/2018  Does Patient Have a Medical Advance Directive? No  Does patient want to make changes to medical advance directive? -  Would patient like information on creating a medical advance directive? No - Patient declined     Chief Complaint  Patient presents with  . Medication Management    Patient would like to discuss Metformin options states Metformin 1000 ER has been denied 2 times and would like to disucss PT in home     HPI:  Pt is a 68 y.o. female seen today for an acute visit to discuss Metformin options states Metformin 1000 ER has been denied 2 times.she states her CBG are in the 140's in the morning.she tries to watch what she eats though likes white rice than brown rice.Also likes pasta.she has started doing senior exercises on video by super sneakers.she does not like to walk outside due to her previous  fall episodes.No recent fall last episode was one year ago.I've discussed with patient recent metformin ER recalls due to contamination with carcinogen agent.patient verbalized understanding.  She follows up with spine specialist at [facility] for her neck pain.she states was given a referral for outpatient Physical Therapy.she was given a list of different place to call.she wonders whether home health Physical therapy will be better than outpatient.Patient aware that Home health PT will be covered if Home bound.she will call the out patient therapy then notify provider if referral is required from PCP.   Past Medical History:  Diagnosis Date  . Anxiety   . Bipolar 1 disorder ([place])   . Bulging of cervical intervertebral disc    C2  . Bulging of thoracic intervertebral disc    t4  . Depression   . Diabetes mellitus without complication ([place])   . Hyperlipidemia   . PTSD (post-traumatic stress disorder)    Past Surgical History:  Procedure Laterality Date  . CATARACT EXTRACTION Right 08/11/2017  . CATARACT EXTRACTION Left 09/01/2017  . EYE SURGERY    . HEMICOLECTOMY  1997  . JOINT REPLACEMENT  2013   hip    Allergies  Allergen Reactions  . Oxycodone     Migraines  . Statins     "makes my urine turn brown"    Outpatient Encounter Medications as of 01/18/2019  Medication Sig  . Accu-Chek FastClix Lancets MISC USE TO TEST BLOOD SUGAR THREE TIMES DAILY  . ARIPiprazole ER (ABILIFY MAINTENA) 300 MG  PRSY prefilled syringe Inject 300 mg into the muscle every 30 (thirty) days. Last injection 04/06/2018  . Ascorbic Acid (VITAMIN C) 1000 MG tablet Take 1,000 mg by mouth daily.  . ASTAXANTHIN PO Take 12 mg by mouth daily.   Jolyne Loa Grape-Goldenseal (BERBERINE COMPLEX PO) Take by mouth. 500 mg 3-4 times daily  . Blood Glucose Calibration (ACCU-CHEK GUIDE CONTROL VI) Use with testing blood sugars. Dx: E11.9  . Blood Glucose Monitoring Suppl (ACCU-CHEK GUIDE) w/Device KIT 1 each by Does  not apply route 3 (three) times daily. Use to test blood sugar three times daily. Dx: E11.9  . Blood Pressure Monitoring (BLOOD PRESSURE CUFF) MISC Check blood pressure twice daily  . clonazePAM (KLONOPIN) 1 MG tablet 1 mg at night, 1/2 tablet during the day if needed  . gabapentin (NEURONTIN) 300 MG capsule Take 1 capsule (300 mg total) by mouth 3 (three) times daily.  Marland Kitchen glucose blood (ACCU-CHEK GUIDE) test strip Use to test blood sugar three times daily. Dx: E11.9  . lamoTRIgine (LAMICTAL) 200 MG tablet Take 200 mg by mouth 2 (two) times daily.   Marland Kitchen lisinopril (PRINIVIL,ZESTRIL) 10 MG tablet Take 1 tablet (10 mg total) by mouth daily.  Marland Kitchen loratadine (CLARITIN) 10 MG tablet Take 1 tablet (10 mg total) by mouth daily.  . metFORMIN (GLUCOPHAGE) 500 MG tablet Take 500 mg by mouth 2 (two) times daily with a meal.  . traZODone (DESYREL) 50 MG tablet Take 50 mg by mouth at bedtime as needed.   . metFORMIN (GLUMETZA) 1000 MG (MOD) 24 hr tablet Take 1 tablet (1,000 mg total) by mouth daily with breakfast. (Patient not taking: Reported on 01/18/2019)   No facility-administered encounter medications on file as of 01/18/2019.     Review of Systems  Constitutional: Negative for appetite change, chills, fatigue and fever.  HENT: Negative for congestion, rhinorrhea, sinus pressure, sinus pain, sneezing and sore throat.   Eyes: Negative for pain, discharge, redness and itching.  Respiratory: Negative for cough, chest tightness, shortness of breath and wheezing.   Cardiovascular: Negative for chest pain, palpitations and leg swelling.  Gastrointestinal: Negative for abdominal distention, abdominal pain, constipation, diarrhea, nausea and vomiting.  Endocrine: Negative for cold intolerance, heat intolerance, polydipsia, polyphagia and polyuria.  Musculoskeletal: Positive for arthralgias, back pain and neck pain.       Follows up with [facility] spine orthopedic    Skin: Negative for color change, pallor and rash.   Neurological: Negative for dizziness, weakness, light-headedness, numbness and headaches.  Psychiatric/Behavioral: Negative for agitation, confusion and sleep disturbance. The patient is not nervous/anxious.     Immunization History  Administered Date(s) Administered  . Fluad Quad(high Dose 65+) 12/21/2018  . Influenza,inj,Quad PF,6+ Mos 04/04/2016  . Influenza-Unspecified 12/29/2013, 12/14/2017  . Pneumococcal Conjugate-13 01/04/2016  . Pneumococcal Polysaccharide-23 09/30/2017  . Tdap 09/29/2015  . Zoster Recombinat (Shingrix) 12/05/2017, 06/03/2018   Pertinent  Health Maintenance Due  Topic Date Due  . FOOT EXAM  01/06/2019  . HEMOGLOBIN A1C  02/16/2019  . MAMMOGRAM  05/02/2019  . OPHTHALMOLOGY EXAM  10/22/2019  . COLONOSCOPY  10/18/2028  . INFLUENZA VACCINE  Completed  . DEXA SCAN  Completed  . PNA vac Low Risk Adult  Completed   Fall Risk  01/18/2019 10/06/2018 08/18/2018 07/12/2018 06/28/2018  Falls in the past year? 0 0 '1 1 1  ' Number falls in past yr: 0 0 '1 1 1  ' Comment - - - - 4-5  Injury with Fall? 0 0 0 0  0  Comment - - - - -  Risk for fall due to : - - History of fall(s) - -   There were no vitals filed for this visit. There is no height or weight on file to calculate BMI. Physical Exam Unable to complete on Telephone visit.   Labs reviewed: Recent Labs    03/10/18 1407 04/07/18 1334 08/16/18 0946  NA 139 136 138  K 4.7 5.4* 4.7  CL 105 104 102  CO2 '25 22 27  ' GLUCOSE 225* 151* 160*  BUN '20 20 19  ' CREATININE 0.96 0.89 0.94  CALCIUM 9.2 9.6 9.7   Recent Labs    04/07/18 1334 08/16/18 0946  AST 18 17  ALT 21 18  BILITOT 0.4 0.3  PROT 6.8 6.8   Recent Labs    04/07/18 1334  WBC 5.1  NEUTROABS 3,759  HGB 13.8  HCT 40.4  MCV 88.2  PLT 221   Lab Results  Component Value Date   TSH 3.34 09/15/2017   Lab Results  Component Value Date   HGBA1C 6.3 (H) 08/16/2018   Lab Results  Component Value Date   CHOL 232 (H) 08/16/2018   HDL 51  08/16/2018   LDLCALC 148 (H) 08/16/2018   TRIG 190 (H) 08/16/2018   CHOLHDL 4.5 08/16/2018    Significant Diagnostic Results in last 30 days:  No results found.  Assessment/Plan 1. Type 2 diabetes mellitus with other specified complication, without long-term current use of insulin (HCC) Lab Results  Component Value Date   HGBA1C 6.3 (H) 08/16/2018   CBG in 140's encouraged to continue with dietary and lifestyle modification. Annual foot and eye exam up to date.on ACE inhibitor for renal protection.   - metFORMIN (GLUCOPHAGE) 500 MG tablet; Take 1 tablet (500 mg total) by mouth 2 (two) times daily with a meal.  Dispense: 60 tablet; Refill: 3  2. Pain of lumbar spine Continue to follow up with [facility] spine Neurosurgery.continue on Gabapentin 300 mg capsule three times daily.   3. Neck pain Continue to follow up with [facility] spine Neurosurgery for spondylosis of cervical  Without myelopathy.outpatient therapy was recommended patient given several option outpatient therapy places to call to schedule appointment.will notify [facility] office if additional referral is required from PCP.     4. Osteoarthritis of the Knees  Continue current pain regimen.continue to follow up with Orthopedic for cortisol injections. Therapy as directed.continue exercises daily.   Family/ staff Communication: Reviewed plan of care with patient   Labs/tests ordered: None  Spent 35 minutes of non-face to face with patient    Sandrea Hughs, NP

## 2019-01-24 ENCOUNTER — Other Ambulatory Visit: Payer: Self-pay | Admitting: *Deleted

## 2019-01-24 DIAGNOSIS — E1169 Type 2 diabetes mellitus with other specified complication: Secondary | ICD-10-CM

## 2019-01-24 DIAGNOSIS — I10 Essential (primary) hypertension: Secondary | ICD-10-CM

## 2019-01-24 MED ORDER — LISINOPRIL 10 MG PO TABS: 10.0000 mg | ORAL_TABLET | Freq: Every day | ORAL | 1 refills | Status: DC

## 2019-01-24 MED ORDER — METFORMIN HCL 500 MG PO TABS: 500.0000 mg | ORAL_TABLET | Freq: Two times a day (BID) | ORAL | 1 refills | Status: DC

## 2019-01-24 NOTE — Telephone Encounter (Signed)
Patient requested refill

## 2019-02-03 DIAGNOSIS — G4733 Obstructive sleep apnea (adult) (pediatric): Secondary | ICD-10-CM | POA: Diagnosis not present

## 2019-02-16 DIAGNOSIS — F310 Bipolar disorder, current episode hypomanic: Secondary | ICD-10-CM | POA: Diagnosis not present

## 2019-02-16 DIAGNOSIS — F419 Anxiety disorder, unspecified: Secondary | ICD-10-CM | POA: Diagnosis not present

## 2019-02-16 DIAGNOSIS — I10 Essential (primary) hypertension: Secondary | ICD-10-CM | POA: Diagnosis not present

## 2019-02-16 DIAGNOSIS — F4310 Post-traumatic stress disorder, unspecified: Secondary | ICD-10-CM | POA: Diagnosis not present

## 2019-02-16 DIAGNOSIS — G8929 Other chronic pain: Secondary | ICD-10-CM | POA: Diagnosis not present

## 2019-02-16 DIAGNOSIS — M25561 Pain in right knee: Secondary | ICD-10-CM | POA: Diagnosis not present

## 2019-02-16 DIAGNOSIS — M5124 Other intervertebral disc displacement, thoracic region: Secondary | ICD-10-CM | POA: Diagnosis not present

## 2019-02-16 DIAGNOSIS — E119 Type 2 diabetes mellitus without complications: Secondary | ICD-10-CM | POA: Diagnosis not present

## 2019-02-16 DIAGNOSIS — M5021 Other cervical disc displacement,  high cervical region: Secondary | ICD-10-CM | POA: Diagnosis not present

## 2019-02-21 DIAGNOSIS — F3181 Bipolar II disorder: Secondary | ICD-10-CM | POA: Diagnosis not present

## 2019-03-05 DIAGNOSIS — G4733 Obstructive sleep apnea (adult) (pediatric): Secondary | ICD-10-CM | POA: Diagnosis not present

## 2019-03-07 DIAGNOSIS — F3181 Bipolar II disorder: Secondary | ICD-10-CM | POA: Diagnosis not present

## 2019-03-13 ENCOUNTER — Other Ambulatory Visit: Payer: Self-pay | Admitting: Family

## 2019-03-15 DIAGNOSIS — F310 Bipolar disorder, current episode hypomanic: Secondary | ICD-10-CM | POA: Diagnosis not present

## 2019-03-15 DIAGNOSIS — M25561 Pain in right knee: Secondary | ICD-10-CM | POA: Diagnosis not present

## 2019-03-15 DIAGNOSIS — I10 Essential (primary) hypertension: Secondary | ICD-10-CM | POA: Diagnosis not present

## 2019-03-15 DIAGNOSIS — E119 Type 2 diabetes mellitus without complications: Secondary | ICD-10-CM | POA: Diagnosis not present

## 2019-03-15 DIAGNOSIS — M5021 Other cervical disc displacement,  high cervical region: Secondary | ICD-10-CM | POA: Diagnosis not present

## 2019-03-15 DIAGNOSIS — G8929 Other chronic pain: Secondary | ICD-10-CM | POA: Diagnosis not present

## 2019-03-15 DIAGNOSIS — F419 Anxiety disorder, unspecified: Secondary | ICD-10-CM | POA: Diagnosis not present

## 2019-03-15 DIAGNOSIS — M5124 Other intervertebral disc displacement, thoracic region: Secondary | ICD-10-CM | POA: Diagnosis not present

## 2019-03-15 DIAGNOSIS — F4310 Post-traumatic stress disorder, unspecified: Secondary | ICD-10-CM | POA: Diagnosis not present

## 2019-03-16 ENCOUNTER — Other Ambulatory Visit: Payer: Medicare HMO

## 2019-03-16 DIAGNOSIS — M5021 Other cervical disc displacement,  high cervical region: Secondary | ICD-10-CM

## 2019-03-16 DIAGNOSIS — M25561 Pain in right knee: Secondary | ICD-10-CM

## 2019-03-16 DIAGNOSIS — F419 Anxiety disorder, unspecified: Secondary | ICD-10-CM | POA: Diagnosis not present

## 2019-03-16 DIAGNOSIS — M5124 Other intervertebral disc displacement, thoracic region: Secondary | ICD-10-CM

## 2019-03-16 DIAGNOSIS — I10 Essential (primary) hypertension: Secondary | ICD-10-CM

## 2019-03-16 DIAGNOSIS — F310 Bipolar disorder, current episode hypomanic: Secondary | ICD-10-CM | POA: Diagnosis not present

## 2019-03-16 DIAGNOSIS — F4310 Post-traumatic stress disorder, unspecified: Secondary | ICD-10-CM | POA: Diagnosis not present

## 2019-03-16 DIAGNOSIS — E119 Type 2 diabetes mellitus without complications: Secondary | ICD-10-CM | POA: Diagnosis not present

## 2019-03-18 ENCOUNTER — Encounter: Payer: Self-pay | Admitting: Family

## 2019-03-21 DIAGNOSIS — F3181 Bipolar II disorder: Secondary | ICD-10-CM | POA: Diagnosis not present

## 2019-03-22 ENCOUNTER — Ambulatory Visit: Payer: Medicare HMO | Admitting: Family

## 2019-03-28 ENCOUNTER — Other Ambulatory Visit: Payer: Self-pay | Admitting: *Deleted

## 2019-03-28 MED ORDER — ACCU-CHEK GUIDE VI STRP: ORAL_STRIP | 3 refills | Status: DC

## 2019-03-28 MED ORDER — ACCU-CHEK GUIDE W/DEVICE KIT: 1.0000 | PACK | Freq: Three times a day (TID) | 0 refills | Status: DC

## 2019-03-28 NOTE — Telephone Encounter (Signed)
Patient blood sugar machine is broken and patient is requesting a new one. Faxed to pharmacy.   Home Health to give patient a call.

## 2019-03-29 DIAGNOSIS — G8929 Other chronic pain: Secondary | ICD-10-CM | POA: Diagnosis not present

## 2019-03-29 DIAGNOSIS — E119 Type 2 diabetes mellitus without complications: Secondary | ICD-10-CM | POA: Diagnosis not present

## 2019-03-29 DIAGNOSIS — I10 Essential (primary) hypertension: Secondary | ICD-10-CM | POA: Diagnosis not present

## 2019-03-29 DIAGNOSIS — F310 Bipolar disorder, current episode hypomanic: Secondary | ICD-10-CM | POA: Diagnosis not present

## 2019-03-29 DIAGNOSIS — F4310 Post-traumatic stress disorder, unspecified: Secondary | ICD-10-CM | POA: Diagnosis not present

## 2019-03-29 DIAGNOSIS — F419 Anxiety disorder, unspecified: Secondary | ICD-10-CM | POA: Diagnosis not present

## 2019-03-29 DIAGNOSIS — M25561 Pain in right knee: Secondary | ICD-10-CM | POA: Diagnosis not present

## 2019-03-29 DIAGNOSIS — M5124 Other intervertebral disc displacement, thoracic region: Secondary | ICD-10-CM | POA: Diagnosis not present

## 2019-03-29 DIAGNOSIS — M5021 Other cervical disc displacement,  high cervical region: Secondary | ICD-10-CM | POA: Diagnosis not present

## 2019-03-30 ENCOUNTER — Other Ambulatory Visit: Payer: Self-pay

## 2019-04-04 ENCOUNTER — Ambulatory Visit: Payer: Medicare HMO | Admitting: Family

## 2019-04-05 DIAGNOSIS — G4733 Obstructive sleep apnea (adult) (pediatric): Secondary | ICD-10-CM | POA: Diagnosis not present

## 2019-04-05 DIAGNOSIS — F3181 Bipolar II disorder: Secondary | ICD-10-CM | POA: Diagnosis not present

## 2019-04-12 ENCOUNTER — Other Ambulatory Visit: Payer: Self-pay

## 2019-04-12 ENCOUNTER — Encounter: Payer: Self-pay | Admitting: Adult Health

## 2019-04-12 ENCOUNTER — Telehealth (INDEPENDENT_AMBULATORY_CARE_PROVIDER_SITE_OTHER): Payer: Medicare HMO | Admitting: Adult Health

## 2019-04-12 DIAGNOSIS — R509 Fever, unspecified: Secondary | ICD-10-CM

## 2019-04-12 NOTE — Progress Notes (Signed)
This service is provided via telemedicine   Vital signs collected by patient and recorded by Medical Assistant  Location of patient (ex: home, work):  Home  Patient consents to a telephone visit:  Yes  Location of the provider (ex: office, home):  [place]  Name of any referring provider:  N/A  Names of all persons participating in the telemedicine service and their role in the encounter:  Seth Bake NP, Marisa Cyphers RMA, Patient Kaitlyn Wood  Time spent on call:  8 minutes with medical assistant       DATE:  04/12/2019   MRN:  [id number]  BIRTHDAY: 10-May-1950   Contact Information    Name Relation Home Work Mobile   Clunn,Christopher Son [phone]            Chief Complaint  Patient presents with  . Acute Visit    Patient c/o Post nasal drip and fever    HISTORY OF PRESENT ILLNESS:  This is a 69 year old female who had a teleconference visit.  She was supposed to have labs done today but had low grade fever 99.6 this morning. She said that she went to sleep and did not take any antipyretics. When she woke up, her temp was 98. Also, she complains of sore throat, headache and swollen lymph node under her left jaw. She  She was freezing and couldn't get warm this morning despite having the heat on and covering with blanket. She denies having any body pains, loss of appetite or smile. Last March 29, 2019, she had negative COVID-19. Testing was required since she has to get into a plane to [place]. She has PMH of hypertension, bipolar disorder, diabetes mellitus and spinal stenosis.   PAST MEDICAL HISTORY:  Past Medical History:  Diagnosis Date  . Anxiety   . Bipolar 1 disorder ([place])   . Bulging of cervical intervertebral disc    C2  . Bulging of thoracic intervertebral disc    t4  . Depression   . Diabetes mellitus without complication ([place])   . Hyperlipidemia   . PTSD (post-traumatic stress disorder)   . Sad      CURRENT MEDICATIONS:  Reviewed  Patient's Medications  New Prescriptions   No medications on file  Previous Medications   ACCU-CHEK FASTCLIX LANCETS MISC    USE TO TEST BLOOD SUGAR THREE TIMES DAILY   ARIPIPRAZOLE ER (ABILIFY MAINTENA) 300 MG PRSY PREFILLED SYRINGE    Inject 300 mg into the muscle every 30 (thirty) days. Last injection 04/06/2018   ASCORBIC ACID (VITAMIN C) 1000 MG TABLET    Take 1,000 mg by mouth daily.   ASTAXANTHIN PO    Take 12 mg by mouth daily.    BARBERRY-OREG GRAPE-GOLDENSEAL (BERBERINE COMPLEX PO)    Take by mouth. 500 mg 3-4 times daily   BLOOD GLUCOSE CALIBRATION (ACCU-CHEK GUIDE CONTROL VI)    Use with testing blood sugars. Dx: E11.9   BLOOD GLUCOSE MONITORING SUPPL (ACCU-CHEK GUIDE) W/DEVICE KIT    1 each by Does not apply route 3 (three) times daily. Use to test blood sugar three times daily. Dx: E11.9   BLOOD PRESSURE MONITORING (BLOOD PRESSURE CUFF) MISC    Check blood pressure twice daily   CLONAZEPAM (KLONOPIN) 1 MG TABLET    1 mg at night, 1/2 tablet during the day if needed   GABAPENTIN (NEURONTIN) 300 MG CAPSULE    Take 1 capsule (300 mg total) by mouth 3 (three) times daily.   GLUCOSE BLOOD (ACCU-CHEK  GUIDE) TEST STRIP    Use to test blood sugar three times daily. Dx: E11.9   LAMOTRIGINE (LAMICTAL) 200 MG TABLET    Take 200 mg by mouth 2 (two) times daily.    LISINOPRIL (ZESTRIL) 10 MG TABLET    Take 1 tablet (10 mg total) by mouth daily.   LORATADINE (CLARITIN) 10 MG TABLET    Take 1 tablet (10 mg total) by mouth daily.   METFORMIN (GLUCOPHAGE) 500 MG TABLET    Take 1 tablet (500 mg total) by mouth 2 (two) times daily with a meal.   SERTRALINE (ZOLOFT) 25 MG TABLET    Take 25 mg by mouth daily.   TRAZODONE (DESYREL) 50 MG TABLET    Take 50 mg by mouth at bedtime as needed.   Modified Medications   No medications on file  Discontinued Medications   No medications on file     Allergies  Allergen Reactions  . Oxycodone     Migraines  . Statins     "makes my urine turn  brown"     REVIEW OF SYSTEMS:  GENERAL: no change in appetite, no fatigue, no weight changes, +fever and chills  MOUTH and THROAT: +sore throat and enlarged lymph node RESPIRATORY: no cough, SOB, DOE, wheezing, hemoptysis CARDIAC: no chest pain, edema or palpitations GI: no abdominal pain, diarrhea, constipation, heart burn, nausea or vomiting GU: Denies dysuria, frequency, hematuria, incontinence, or discharge NEUROLOGICAL: Denies dizziness, syncope, numbness, or headache PSYCHIATRIC: Denies feeling of depression or anxiety. No report of hallucinations, insomnia, paranoia, or agitation    LABS/RADIOLOGY: Labs reviewed: Basic Metabolic Panel: Recent Labs    08/16/18 0946  NA 138  K 4.7  CL 102  CO2 27  GLUCOSE 160*  BUN 19  CREATININE 0.94  CALCIUM 9.7   Liver Function Tests: Recent Labs    08/16/18 0946  AST 17  ALT 18  BILITOT 0.3  PROT 6.8   Lipid Panel: Recent Labs    08/16/18 0946  HDL 51      ASSESSMENT/PLAN:  1. Fever, unspecified fever cause - with headache, sore throat and enlarged lymph node, advised her to get COVID-19 testing and once it is negative, then she can come to [facility] and have her labs drawn for her routine check up - encouraged to drink plenty of fluids and rest - she needs to call [facility] or urgent care if she starts having shortness of breath    Time spent on non face to face visit:  12 minutes  The patient gave consent to this video visit. Explained to the patient the risk and privacy issue that was involved with this video call.   The patient was advised to call back and ask for an in-person evaluation if the symptoms worsen or if the condition fails to improve.   Durenda Age, NP [employer] [phone]

## 2019-04-14 ENCOUNTER — Ambulatory Visit: Payer: Medicare HMO | Attending: Internal Medicine

## 2019-04-14 ENCOUNTER — Other Ambulatory Visit: Payer: Medicare HMO

## 2019-04-14 DIAGNOSIS — Z20822 Contact with and (suspected) exposure to covid-19: Secondary | ICD-10-CM | POA: Diagnosis not present

## 2019-04-15 LAB — NOVEL CORONAVIRUS, NAA: SARS-CoV-2, NAA: NOT DETECTED

## 2019-04-18 ENCOUNTER — Ambulatory Visit: Payer: Medicare HMO | Admitting: Family

## 2019-04-20 ENCOUNTER — Telehealth: Payer: Self-pay | Admitting: *Deleted

## 2019-04-20 ENCOUNTER — Other Ambulatory Visit: Payer: Self-pay

## 2019-04-20 NOTE — Telephone Encounter (Signed)
Received fax from [facility]. Places form in Kaitlyn Wood's folder to review and sign. To be faxed back to [employer] once completed at Fax:[phone] PDN: [id number]

## 2019-04-21 ENCOUNTER — Other Ambulatory Visit: Payer: Medicare HMO

## 2019-04-21 ENCOUNTER — Other Ambulatory Visit: Payer: Self-pay

## 2019-04-21 DIAGNOSIS — E1169 Type 2 diabetes mellitus with other specified complication: Secondary | ICD-10-CM | POA: Diagnosis not present

## 2019-04-21 DIAGNOSIS — E785 Hyperlipidemia, unspecified: Secondary | ICD-10-CM | POA: Diagnosis not present

## 2019-04-21 DIAGNOSIS — I10 Essential (primary) hypertension: Secondary | ICD-10-CM | POA: Diagnosis not present

## 2019-04-22 ENCOUNTER — Other Ambulatory Visit: Payer: Self-pay | Admitting: Nurse Practitioner

## 2019-04-22 DIAGNOSIS — G894 Chronic pain syndrome: Secondary | ICD-10-CM

## 2019-04-22 LAB — CBC WITH DIFFERENTIAL/PLATELET
Absolute Monocytes: 210 cells/uL (ref 200–950)
Basophils Absolute: 8 cells/uL (ref 0–200)
Basophils Relative: 0.2 %
Eosinophils Absolute: 0 cells/uL — ABNORMAL LOW (ref 15–500)
Eosinophils Relative: 0 %
HCT: 40.4 % (ref 35.0–45.0)
Hemoglobin: 13.2 g/dL (ref 11.7–15.5)
Lymphs Abs: 890 cells/uL (ref 850–3900)
MCH: 28.7 pg (ref 27.0–33.0)
MCHC: 32.7 g/dL (ref 32.0–36.0)
MCV: 87.8 fL (ref 80.0–100.0)
MPV: 9.9 fL (ref 7.5–12.5)
Monocytes Relative: 5 %
Neutro Abs: 3091 cells/uL (ref 1500–7800)
Neutrophils Relative %: 73.6 %
Platelets: 212 10*3/uL (ref 140–400)
RBC: 4.6 10*6/uL (ref 3.80–5.10)
RDW: 12.5 % (ref 11.0–15.0)
Total Lymphocyte: 21.2 %
WBC: 4.2 10*3/uL (ref 3.8–10.8)

## 2019-04-22 LAB — COMPLETE METABOLIC PANEL WITH GFR
AG Ratio: 2.3 (calc) (ref 1.0–2.5)
ALT: 14 U/L (ref 6–29)
AST: 14 U/L (ref 10–35)
Albumin: 4.4 g/dL (ref 3.6–5.1)
Alkaline phosphatase (APISO): 74 U/L (ref 37–153)
BUN: 18 mg/dL (ref 7–25)
CO2: 26 mmol/L (ref 20–32)
Calcium: 9.3 mg/dL (ref 8.6–10.4)
Chloride: 102 mmol/L (ref 98–110)
Creat: 0.9 mg/dL (ref 0.50–0.99)
GFR, Est African American: 76 mL/min/{1.73_m2} (ref >=60)
GFR, Est Non African American: 65 mL/min/{1.73_m2} (ref >=60)
Globulin: 1.9 g/dL (calc) (ref 1.9–3.7)
Glucose, Bld: 118 mg/dL — ABNORMAL HIGH (ref 65–99)
Potassium: 4.5 mmol/L (ref 3.5–5.3)
Sodium: 138 mmol/L (ref 135–146)
Total Bilirubin: 0.3 mg/dL (ref 0.2–1.2)
Total Protein: 6.3 g/dL (ref 6.1–8.1)

## 2019-04-22 LAB — LIPID PANEL
Cholesterol: 219 mg/dL — ABNORMAL HIGH (ref <200)
HDL: 48 mg/dL — ABNORMAL LOW (ref >=50)
LDL Cholesterol (Calc): 130 mg/dL (calc) — ABNORMAL HIGH
Non-HDL Cholesterol (Calc): 171 mg/dL (calc) — ABNORMAL HIGH (ref <130)
Total CHOL/HDL Ratio: 4.6 (calc) (ref <5.0)
Triglycerides: 265 mg/dL — ABNORMAL HIGH (ref <150)

## 2019-04-22 LAB — TSH: TSH: 1.52 mIU/L (ref 0.40–4.50)

## 2019-04-22 LAB — HEMOGLOBIN A1C
Hgb A1c MFr Bld: 6.2 % of total Hgb — ABNORMAL HIGH (ref <5.7)
Mean Plasma Glucose: 131 (calc)
eAG (mmol/L): 7.3 (calc)

## 2019-04-25 ENCOUNTER — Encounter: Payer: Self-pay | Admitting: Family

## 2019-04-25 ENCOUNTER — Ambulatory Visit (INDEPENDENT_AMBULATORY_CARE_PROVIDER_SITE_OTHER): Payer: Medicare HMO | Admitting: Family

## 2019-04-25 ENCOUNTER — Other Ambulatory Visit: Payer: Self-pay

## 2019-04-25 VITALS — BP 132/80 | HR 86 | Temp 98.3°F | Ht 63.0 in | Wt 187.0 lb

## 2019-04-25 DIAGNOSIS — E782 Mixed hyperlipidemia: Secondary | ICD-10-CM

## 2019-04-25 DIAGNOSIS — M25561 Pain in right knee: Secondary | ICD-10-CM | POA: Diagnosis not present

## 2019-04-25 DIAGNOSIS — G8929 Other chronic pain: Secondary | ICD-10-CM

## 2019-04-25 DIAGNOSIS — L989 Disorder of the skin and subcutaneous tissue, unspecified: Secondary | ICD-10-CM

## 2019-04-25 DIAGNOSIS — I10 Essential (primary) hypertension: Secondary | ICD-10-CM | POA: Diagnosis not present

## 2019-04-25 DIAGNOSIS — E119 Type 2 diabetes mellitus without complications: Secondary | ICD-10-CM

## 2019-04-25 NOTE — Progress Notes (Signed)
Provider: Marlowe Sax FNP-C   Sargon Scouten, [name], NP  Patient Care Team: Maiko Salais, [name], NP as PCP - General (Family Medicine) Brien Few, MD as Consulting Physician (Obstetrics and Gynecology) Katy Apo, MD as Consulting Physician (Ophthalmology)  Extended Emergency Contact Information Primary Emergency Contact: [place], [place] Phone: [phone] Relation: Son  Code Status: Full Code  Goals of care: Advanced Directive information Advanced Directives 10/06/2018  Does Patient Have a Medical Advance Directive? No  Does patient want to make changes to medical advance directive? -  Would patient like information on creating a medical advance directive? No - Patient declined     Chief Complaint  Patient presents with  . Medical Management of Chronic Issues    [facility] follow-up    HPI:  Pt is a 69y.o. female seen today for medical management of chronic diseases.she denies any acute issues today.No recent fall episodes or weight loss. Hypertension- Home B/P log in the  120's/80's- 130/80's with some readings above 140's/90's worst when anxious.she does exercise program on video x 45 minutes daily.she used to participate in silver sneakers but affected by the COVID-19 restrictions.  Type 2 DM- CBG 110's - 120's.latest Hgb A1C reviewed 6.2 progressive improvement compared to one year ago.Reports no signs of hypoglycemia.on Metformin 500 mg tablet twice daily   Hyperlipidemia - Chol 219,TRG 265,LDL 130 Previous 232,190 and 148  Exercise 45 minutes via video. Has changed diet though states had a several weeks when she was craving bread and butter.she has stopped eating bread and butter now. She exercise as above.  Bipolar disorder/Generalized anxiety - continues to follow up with Psychiatrist.mood stable on Sertraline 25 mg daily,  Past Medical History:  Diagnosis Date  . Anxiety   . B[place] 1 [place]   .  Bulging of cervical intervertebral disc    C2  . Bulging of thoracic intervertebral disc    t4  . Depression   . Diabetes mellitus without complication ([place]   . Hyperlipidemia   . PTSD (post-traumatic stress disorder)   . Sad    Past Surgical History:  Procedure Laterality Date  . CATARACT EXTRACTION Right 08/11/2017  . CATARACT EXTRACTION Left 09/01/2017  . EYE SURGERY    . HEMICOLECTOMY  1997  . JOINT REPLACEMENT  2013   hip    Allergies  Allergen Reactions  . Oxycodone     Migraines  . Statins     "makes my urine turn brown"    Allergies as of 04/25/2019      Reactions   Oxycodone    Migraines   Statins    "makes my urine turn brown"      Medication List       Accurate as of April 25, 2019  1:34 PM. If you have any questions, ask your nurse or doctor.        STOP taking these medications   ACCU-CHEK GUIDE CONTROL VI Stopped by: DSandrea Hughs NP   Accu-Chek Guide w/Device Kit Stopped by: DNelda BucksNgetich, NP   Blood Pressure Cuff Misc Stopped by: DSandrea Hughs NP     TAKE these medications   Accu-Chek FastClix Lancets Misc USE TO TEST BLOOD SUGAR THREE TIMES DAILY   Accu-Chek Guide test strip Generic drug: glucose blood Use to test blood sugar three times daily. Dx: E11.9   ARIPiprazole ER 300 MG Prsy prefilled syringe Commonly known  as: ABILIFY MAINTENA Inject 300 mg into the muscle every 30 (thirty) days. Last injection 04/06/2018   ASTAXANTHIN PO Take 12 mg by mouth daily.   BERBERINE COMPLEX PO Take by mouth. 500 mg 3-4 times daily   clonazePAM 1 MG tablet Commonly known as: KLONOPIN 1 mg at night, 1/2 tablet during the day if needed   gabapentin 300 MG capsule Commonly known as: NEURONTIN TAKE 1 CAPSULE(300 MG) BY MOUTH THREE TIMES DAILY   lamoTRIgine 200 MG tablet Commonly known as: LAMICTAL Take 200 mg by mouth 2 (two) times daily.   lisinopril 10 MG tablet Commonly known as: ZESTRIL Take 1 tablet (10 mg total) by  mouth daily.   loratadine 10 MG tablet Commonly known as: CLARITIN Take 1 tablet (10 mg total) by mouth daily.   metFORMIN 500 MG tablet Commonly known as: GLUCOPHAGE Take 1 tablet (500 mg total) by mouth 2 (two) times daily with a meal.   sertraline 25 MG tablet Commonly known as: ZOLOFT Take 25 mg by mouth daily.   traZODone 50 MG tablet Commonly known as: DESYREL Take 50 mg by mouth at bedtime as needed.   vitamin C 1000 MG tablet Take 1,000 mg by mouth daily.       Review of Systems  Constitutional: Negative for appetite change, chills, fatigue and fever.  HENT: Negative for congestion, rhinorrhea, sinus pressure, sinus pain, sneezing and sore throat.   Eyes: Negative for discharge, redness and itching.  Respiratory: Negative for cough, chest tightness, shortness of breath and wheezing.   Cardiovascular: Negative for chest pain, palpitations and leg swelling.  Gastrointestinal: Negative for abdominal distention, abdominal pain, constipation, diarrhea, nausea and vomiting.  Endocrine: Negative for cold intolerance, heat intolerance, polydipsia, polyphagia and polyuria.  Genitourinary: Negative for difficulty urinating, dysuria, flank pain, frequency and urgency.  Musculoskeletal: Positive for arthralgias. Negative for gait problem, joint swelling and neck stiffness.       Chronic knee pain worst on right knee.Swimming exercises was effective prior to COVD-19 restrictions.   Skin: Negative for color change, pallor, rash and wound.  Neurological: Negative for dizziness, speech difficulty, weakness, light-headedness, numbness and headaches.  Hematological: Does not bruise/bleed easily.  Psychiatric/Behavioral: Negative for agitation, confusion, self-injury, sleep disturbance and suicidal ideas. The patient is nervous/anxious.     Immunization History  Administered Date(s) Administered  . Fluad Quad(high Dose 65+) 12/21/2018  . Influenza,inj,Quad PF,6+ Mos 04/04/2016  .  Influenza-Unspecified 12/29/2013, 12/14/2017  . Pneumococcal Conjugate-13 01/04/2016  . Pneumococcal Polysaccharide-23 09/30/2017  . Tdap 09/29/2015  . Zoster Recombinat (Shingrix) 12/05/2017, 06/03/2018   Pertinent  Health Maintenance Due  Topic Date Due  . FOOT EXAM  01/06/2019  . MAMMOGRAM  05/02/2019  . HEMOGLOBIN A1C  10/19/2019  . OPHTHALMOLOGY EXAM  10/22/2019  . COLONOSCOPY  10/18/2028  . INFLUENZA VACCINE  Completed  . DEXA SCAN  Completed  . PNA vac Low Risk Adult  Completed   Fall Risk  04/25/2019 01/18/2019 10/06/2018 08/18/2018 07/12/2018  Falls in the past year? 0 0 0 1 1  Number falls in past yr: 0 0 0 1 1  Comment - - - - -  Injury with Fall? 0 0 0 0 0  Comment - - - - -  Risk for fall due to : - - - History of fall(s) -    Vitals:   04/25/19 1309  BP: 132/80  Pulse: 86  Temp: 98.3 F (36.8 C)  TempSrc: Oral  SpO2: 97%  Weight: 187 lb (  84.8 kg)  Height: '5\' 3"'$  (1.6 m)   Body mass index is 33.13 kg/m. Physical Exam Vitals reviewed.  Constitutional:      General: She is not in acute distress.    Appearance: She is obese. She is not ill-appearing.  HENT:     Head: Normocephalic.     Right Ear: Tympanic membrane, ear canal and external ear normal. There is no impacted cerumen.     Left Ear: Tympanic membrane, ear canal and external ear normal. There is no impacted cerumen.     Nose: Nose normal. No congestion or rhinorrhea.     Mouth/Throat:     Mouth: Mucous membranes are moist.     Pharynx: Oropharynx is clear. No oropharyngeal exudate or posterior oropharyngeal erythema.  Eyes:     General: No scleral icterus.       Right eye: No discharge.        Left eye: No discharge.     Extraocular Movements: Extraocular movements intact.     Conjunctiva/sclera: Conjunctivae normal.     Pupils: Pupils are equal, round, and reactive to light.  Neck:     Vascular: No carotid bruit.  Cardiovascular:     Rate and Rhythm: Normal rate and regular rhythm.      Pulses: Normal pulses.     Heart sounds: Normal heart sounds. No murmur. No friction rub. No gallop.   Pulmonary:     Effort: Pulmonary effort is normal. No respiratory distress.     Breath sounds: Normal breath sounds. No wheezing, rhonchi or rales.  Chest:     Chest wall: No tenderness.  Abdominal:     General: Bowel sounds are normal. There is no distension.     Palpations: Abdomen is soft. There is no mass.     Tenderness: There is no abdominal tenderness. There is no right CVA tenderness, left CVA tenderness, guarding or rebound.  Musculoskeletal:        General: No swelling or tenderness.     Cervical back: Normal range of motion. No rigidity or tenderness.     Right knee: Crepitus present. No swelling, effusion, erythema or ecchymosis. Normal range of motion. No tenderness.     Left knee: No swelling, effusion, erythema, ecchymosis or crepitus. Normal range of motion. No tenderness.     Right lower leg: Edema present.     Left lower leg: Edema present.     Comments: Chronic non-pitting edema.  Lymphadenopathy:     Cervical: No cervical adenopathy.  Skin:    General: Skin is warm.     Coloration: Skin is not pale.     Findings: Lesion present. No bruising, erythema or rash.     Comments: Left subclavian and left lateral leg flesh skin lesion.   Neurological:     Mental Status: She is alert and oriented to person, place, and time.     Cranial Nerves: No cranial nerve deficit.     Sensory: No sensory deficit.     Motor: No weakness.     Coordination: Coordination normal.     Gait: Gait normal.  Psychiatric:        Mood and Affect: Mood normal.        Behavior: Behavior normal.        Thought Content: Thought content normal.        Judgment: Judgment normal.     Labs reviewed: Recent Labs    08/16/18 0946 04/21/19 0942  NA 138 138  K 4.7  4.5  CL 102 102  CO2 27 26  GLUCOSE 160* 118*  BUN 19 18  CREATININE 0.94 0.90  CALCIUM 9.7 9.3   Recent Labs     08/16/18 0946 04/21/19 0942  AST 17 14  ALT 18 14  BILITOT 0.3 0.3  PROT 6.8 6.3   Recent Labs    04/21/19 0942  WBC 4.2  NEUTROABS 3,091  HGB 13.2  HCT 40.4  MCV 87.8  PLT 212   Lab Results  Component Value Date   TSH 1.52 04/21/2019   Lab Results  Component Value Date   HGBA1C 6.2 (H) 04/21/2019   Lab Results  Component Value Date   CHOL 219 (H) 04/21/2019   HDL 48 (L) 04/21/2019   LDLCALC 130 (H) 04/21/2019   TRIG 265 (H) 04/21/2019   CHOLHDL 4.6 04/21/2019    Significant Diagnostic Results in last 30 days:  No results found.  Assessment/Plan 1. Essential hypertension B/p at goal this visit.Home B/p slightly elevated though checks B/p when anxious.encouraged to check resting blood pressure and record. - Notify provider's office if B/p > 140/90 - DASH diet discussed and education information provided on AVS. - continue on lisinopril 10 mg tablet daily.Will add Hydrochlorothiazide if B/P > 140/90  - CBC with Differential/Platelet; Future - CMP with eGFR([facility]); Future - TSH; Future  2. Type 2 diabetes mellitus without complication, without long-term current use of insulin (HCC) Lab Results  Component Value Date   HGBA1C 6.2 (H) 04/21/2019  CBG under controlled.continue on Metformin 500 mg tablet one by mouth twice daily.on Gabapentin 300 mg capsule three times daily. -Continue on ACE inhibitor for renal protection.  - Annual eye exam up to date   - Ambulatory referral to Podiatry for annual foot exam.   - TSH; Future - Hgb A1C,future   3. Mixed hyperlipidemia Chol,TRG and LDL not at goal.Declines statin.Dietary and lifestyle modification advised. - Lipid panel; Future - Hemoglobin A1c; Future  4. Chronic pain of right knee Chronic arthritic pain.Swimming exercise has been effective.awaiting COVID-19 restrictions to resume exercises.continue with home exercises. - Has had cortisol injection in the past with Orthopedic.will refer to orthopedic if  worsening.   5. Skin lesion Flesh like lesion on left subclavian and left upper medial leg.No signs of infection.  - Ambulatory referral to Dermatology  Family/ staff Communication: Reviewed plan of care with patient  Labs/tests ordered:  - CBC with Differential/Platelet; Future - CMP with eGFR([facility]); Future - TSH; Future - Hgb A1C,future   Next Appointment : 6 months for medical management of chronic issues.Labs 2-4 days prior to visit   Sandrea Hughs, NP

## 2019-04-25 NOTE — Patient Instructions (Addendum)
Check Blood pressure twice daily.Notify provider's office if B/p > 140/90   DASH Eating Plan DASH stands for "Dietary Approaches to Stop Hypertension." The DASH eating plan is a healthy eating plan that has been shown to reduce high blood pressure (hypertension). It may also reduce your risk for type 2 diabetes, heart disease, and stroke. The DASH eating plan may also help with weight loss. What are tips for following this plan?  General guidelines  Avoid eating more than 2,300 mg (milligrams) of salt (sodium) a day. If you have hypertension, you may need to reduce your sodium intake to 1,500 mg a day.  Limit alcohol intake to no more than 1 drink a day for nonpregnant women and 2 drinks a day for men. One drink equals 12 oz of beer, 5 oz of wine, or 1 oz of hard liquor.  Work with your health care provider to maintain a healthy body weight or to lose weight. Ask what an ideal weight is for you.  Get at least 30 minutes of exercise that causes your heart to beat faster (aerobic exercise) most days of the week. Activities may include walking, swimming, or biking.  Work with your health care provider or diet and nutrition specialist (dietitian) to adjust your eating plan to your individual calorie needs. Reading food labels   Check food labels for the amount of sodium per serving. Choose foods with less than 5 percent of the Daily Value of sodium. Generally, foods with less than 300 mg of sodium per serving fit into this eating plan.  To find whole grains, look for the word "whole" as the first word in the ingredient list. Shopping  Buy products labeled as "low-sodium" or "no salt added."  Buy fresh foods. Avoid canned foods and premade or frozen meals. Cooking  Avoid adding salt when cooking. Use salt-free seasonings or herbs instead of table salt or sea salt. Check with your health care provider or pharmacist before using salt substitutes.  Do not fry foods. Cook foods using healthy  methods such as baking, boiling, grilling, and broiling instead.  Cook with heart-healthy oils, such as olive, canola, soybean, or sunflower oil. Meal planning  Eat a balanced diet that includes: ? 5 or more servings of fruits and vegetables each day. At each meal, try to fill half of your plate with fruits and vegetables. ? Up to 6-8 servings of whole grains each day. ? Less than 6 oz of lean meat, poultry, or fish each day. A 3-oz serving of meat is about the same size as a deck of cards. One egg equals 1 oz. ? 2 servings of low-fat dairy each day. ? A serving of nuts, seeds, or beans 5 times each week. ? Heart-healthy fats. Healthy fats called Omega-3 fatty acids are found in foods such as flaxseeds and coldwater fish, like sardines, salmon, and mackerel.  Limit how much you eat of the following: ? Canned or prepackaged foods. ? Food that is high in trans fat, such as fried foods. ? Food that is high in saturated fat, such as fatty meat. ? Sweets, desserts, sugary drinks, and other foods with added sugar. ? Full-fat dairy products.  Do not salt foods before eating.  Try to eat at least 2 vegetarian meals each week.  Eat more home-cooked food and less restaurant, buffet, and fast food.  When eating at a restaurant, ask that your food be prepared with less salt or no salt, if possible. What foods are recommended? The  items listed may not be a complete list. Talk with your dietitian about what dietary choices are best for you. Grains Whole-grain or whole-wheat bread. Whole-grain or whole-wheat pasta. Brown rice. [place]. Bulgur. Whole-grain and low-sodium cereals. Pita bread. Low-fat, low-sodium crackers. Whole-wheat flour tortillas. Vegetables Fresh or frozen vegetables (raw, steamed, roasted, or grilled). Low-sodium or reduced-sodium tomato and vegetable juice. Low-sodium or reduced-sodium tomato sauce and tomato paste. Low-sodium or reduced-sodium canned  vegetables. Fruits All fresh, dried, or frozen fruit. Canned fruit in natural juice (without added sugar). Meat and other protein foods Skinless chicken or [place]. Ground chicken or [place]. Pork with fat trimmed off. Fish and seafood. Egg whites. Dried beans, peas, or lentils. Unsalted nuts, nut butters, and seeds. Unsalted canned beans. Lean cuts of beef with fat trimmed off. Low-sodium, lean deli meat. Dairy Low-fat (1%) or fat-free (skim) milk. Fat-free, low-fat, or reduced-fat cheeses. Nonfat, low-sodium ricotta or cottage cheese. Low-fat or nonfat yogurt. Low-fat, low-sodium cheese. Fats and oils Soft margarine without trans fats. Vegetable oil. Low-fat, reduced-fat, or light mayonnaise and salad dressings (reduced-sodium). Canola, safflower, olive, soybean, and sunflower oils. Avocado. Seasoning and other foods Herbs. Spices. Seasoning mixes without salt. Unsalted popcorn and pretzels. Fat-free sweets. What foods are not recommended? The items listed may not be a complete list. Talk with your dietitian about what dietary choices are best for you. Grains Baked goods made with fat, such as croissants, muffins, or some breads. Dry pasta or rice meal packs. Vegetables Creamed or fried vegetables. Vegetables in a cheese sauce. Regular canned vegetables (not low-sodium or reduced-sodium). Regular canned tomato sauce and paste (not low-sodium or reduced-sodium). Regular tomato and vegetable juice (not low-sodium or reduced-sodium). Angie Fava. Olives. Fruits Canned fruit in a light or heavy syrup. Fried fruit. Fruit in cream or butter sauce. Meat and other protein foods Fatty cuts of meat. Ribs. Fried meat. Berniece Salines. Sausage. Bologna and other processed lunch meats. Salami. Fatback. Hotdogs. Bratwurst. Salted nuts and seeds. Canned beans with added salt. Canned or smoked fish. Whole eggs or egg yolks. Chicken or [place] with skin. Dairy Whole or 2% milk, cream, and half-and-half. Whole or full-fat  cream cheese. Whole-fat or sweetened yogurt. Full-fat cheese. Nondairy creamers. Whipped toppings. Processed cheese and cheese spreads. Fats and oils Butter. Stick margarine. Lard. Shortening. Ghee. Bacon fat. Tropical oils, such as coconut, palm kernel, or palm oil. Seasoning and other foods Salted popcorn and pretzels. Onion salt, garlic salt, seasoned salt, table salt, and sea salt. Worcestershire sauce. Tartar sauce. Barbecue sauce. Teriyaki sauce. Soy sauce, including reduced-sodium. Steak sauce. Canned and packaged gravies. Fish sauce. Oyster sauce. Cocktail sauce. Horseradish that you find on the shelf. Ketchup. Mustard. Meat flavorings and tenderizers. Bouillon cubes. Hot sauce and Tabasco sauce. Premade or packaged marinades. Premade or packaged taco seasonings. Relishes. Regular salad dressings. Where to find more information:  National Heart, Lung, and [place]: https://wilson-eaton.com/  American Heart Association: www.heart.org Summary  The DASH eating plan is a healthy eating plan that has been shown to reduce high blood pressure (hypertension). It may also reduce your risk for type 2 diabetes, heart disease, and stroke.  With the DASH eating plan, you should limit salt (sodium) intake to 2,300 mg a day. If you have hypertension, you may need to reduce your sodium intake to 1,500 mg a day.  When on the DASH eating plan, aim to eat more fresh fruits and vegetables, whole grains, lean proteins, low-fat dairy, and heart-healthy fats.  Work with your health care provider  or diet and nutrition specialist (dietitian) to adjust your eating plan to your individual calorie needs. This information is not intended to replace advice given to you by your health care provider. Make sure you discuss any questions you have with your health care provider. Document Revised: 02/27/2017 Document Reviewed: 03/10/2016 Elsevier Patient Education  2020 [employer].

## 2019-04-26 ENCOUNTER — Other Ambulatory Visit: Payer: Self-pay | Admitting: Family

## 2019-04-26 DIAGNOSIS — E1169 Type 2 diabetes mellitus with other specified complication: Secondary | ICD-10-CM

## 2019-04-28 ENCOUNTER — Telehealth: Payer: Self-pay | Admitting: *Deleted

## 2019-04-28 NOTE — Telephone Encounter (Signed)
Has patient conducted [facility] to check when they will be coming? Please call Agency and check when they will be seeing patient.

## 2019-04-28 NOTE — Telephone Encounter (Signed)
Patient called and stated that [facility] [phone]) Has Not came to give her injection of Ambilify. Stated that last time they missed it by 10 days and her psychiatrist was not happy with that.

## 2019-04-28 NOTE — Telephone Encounter (Signed)
Called [facility] [phone] and went into voicemail- LMOM to return call.

## 2019-04-29 ENCOUNTER — Encounter: Payer: Self-pay | Admitting: Podiatry

## 2019-04-29 ENCOUNTER — Other Ambulatory Visit: Payer: Self-pay

## 2019-04-29 ENCOUNTER — Ambulatory Visit: Payer: Medicare HMO | Admitting: Podiatry

## 2019-04-29 VITALS — BP 149/87 | HR 83

## 2019-04-29 DIAGNOSIS — M79675 Pain in left toe(s): Secondary | ICD-10-CM

## 2019-04-29 DIAGNOSIS — M2012 Hallux valgus (acquired), left foot: Secondary | ICD-10-CM | POA: Diagnosis not present

## 2019-04-29 DIAGNOSIS — M2011 Hallux valgus (acquired), right foot: Secondary | ICD-10-CM | POA: Diagnosis not present

## 2019-04-29 DIAGNOSIS — E119 Type 2 diabetes mellitus without complications: Secondary | ICD-10-CM | POA: Diagnosis not present

## 2019-04-29 DIAGNOSIS — I10 Essential (primary) hypertension: Secondary | ICD-10-CM | POA: Diagnosis not present

## 2019-04-29 DIAGNOSIS — M79674 Pain in right toe(s): Secondary | ICD-10-CM | POA: Diagnosis not present

## 2019-04-29 DIAGNOSIS — M5021 Other cervical disc displacement,  high cervical region: Secondary | ICD-10-CM | POA: Diagnosis not present

## 2019-04-29 DIAGNOSIS — B351 Tinea unguium: Secondary | ICD-10-CM | POA: Diagnosis not present

## 2019-04-29 DIAGNOSIS — M25561 Pain in right knee: Secondary | ICD-10-CM | POA: Diagnosis not present

## 2019-04-29 DIAGNOSIS — G8929 Other chronic pain: Secondary | ICD-10-CM | POA: Diagnosis not present

## 2019-04-29 DIAGNOSIS — F419 Anxiety disorder, unspecified: Secondary | ICD-10-CM | POA: Diagnosis not present

## 2019-04-29 DIAGNOSIS — M5124 Other intervertebral disc displacement, thoracic region: Secondary | ICD-10-CM | POA: Diagnosis not present

## 2019-04-29 DIAGNOSIS — F4310 Post-traumatic stress disorder, unspecified: Secondary | ICD-10-CM | POA: Diagnosis not present

## 2019-04-29 DIAGNOSIS — F310 Bipolar disorder, current episode hypomanic: Secondary | ICD-10-CM | POA: Diagnosis not present

## 2019-04-29 NOTE — Telephone Encounter (Signed)
Spoke with [facility] and Kaitlyn Wood is on the schedule to go give pt injection on 04/29/2019.  Spoke with patient and she got her injection today.

## 2019-04-29 NOTE — Patient Instructions (Signed)
Diabetes Mellitus and Foot Care Foot care is an important part of your health, especially when you have diabetes. Diabetes may cause you to have problems because of poor blood flow (circulation) to your feet and legs, which can cause your skin to:  Become thinner and drier.  Break more easily.  Heal more slowly.  Peel and crack. You may also have nerve damage (neuropathy) in your legs and feet, causing decreased feeling in them. This means that you may not notice minor injuries to your feet that could lead to more serious problems. Noticing and addressing any potential problems early is the best way to prevent future foot problems. How to care for your feet Foot hygiene  Wash your feet daily with warm water and mild soap. Do not use hot water. Then, pat your feet and the areas between your toes until they are completely dry. Do not soak your feet as this can dry your skin.  Trim your toenails straight across. Do not dig under them or around the cuticle. File the edges of your nails with an emery board or nail file.  Apply a moisturizing lotion or petroleum jelly to the skin on your feet and to dry, brittle toenails. Use lotion that does not contain alcohol and is unscented. Do not apply lotion between your toes. Shoes and socks  Wear clean socks or stockings every day. Make sure they are not too tight. Do not wear knee-high stockings since they may decrease blood flow to your legs.  Wear shoes that fit properly and have enough cushioning. Always look in your shoes before you put them on to be sure there are no objects inside.  To break in new shoes, wear them for just a few hours a day. This prevents injuries on your feet. Wounds, scrapes, corns, and calluses  Check your feet daily for blisters, cuts, bruises, sores, and redness. If you cannot see the bottom of your feet, use a mirror or ask someone for help.  Do not cut corns or calluses or try to remove them with medicine.  If you  find a minor scrape, cut, or break in the skin on your feet, keep it and the skin around it clean and dry. You may clean these areas with mild soap and water. Do not clean the area with peroxide, alcohol, or iodine.  If you have a wound, scrape, corn, or callus on your foot, look at it several times a day to make sure it is healing and not infected. Check for: ? Redness, swelling, or pain. ? Fluid or blood. ? Warmth. ? Pus or a bad smell. General instructions  Do not cross your legs. This may decrease blood flow to your feet.  Do not use heating pads or hot water bottles on your feet. They may burn your skin. If you have lost feeling in your feet or legs, you may not know this is happening until it is too late.  Protect your feet from hot and cold by wearing shoes, such as at the beach or on hot pavement.  Schedule a complete foot exam at least once a year (annually) or more often if you have foot problems. If you have foot problems, report any cuts, sores, or bruises to your health care provider immediately. Contact a health care provider if:  You have a medical condition that increases your risk of infection and you have any cuts, sores, or bruises on your feet.  You have an injury that is not   healing.  You have redness on your legs or feet.  You feel burning or tingling in your legs or feet.  You have pain or cramps in your legs and feet.  Your legs or feet are numb.  Your feet always feel cold.  You have pain around a toenail. Get help right away if:  You have a wound, scrape, corn, or callus on your foot and: ? You have pain, swelling, or redness that gets worse. ? You have fluid or blood coming from the wound, scrape, corn, or callus. ? Your wound, scrape, corn, or callus feels warm to the touch. ? You have pus or a bad smell coming from the wound, scrape, corn, or callus. ? You have a fever. ? You have a red line going up your leg. Summary  Check your feet every day  for cuts, sores, red spots, swelling, and blisters.  Moisturize feet and legs daily.  Wear shoes that fit properly and have enough cushioning.  If you have foot problems, report any cuts, sores, or bruises to your health care provider immediately.  Schedule a complete foot exam at least once a year (annually) or more often if you have foot problems. This information is not intended to replace advice given to you by your health care provider. Make sure you discuss any questions you have with your health care provider. Document Revised: 12/08/2018 Document Reviewed: 04/18/2016 Elsevier Patient Education  2020 [employer]  

## 2019-04-29 NOTE — Telephone Encounter (Signed)
Called [facility] and spoke with Venida Jarvis and she stated that she will have patient's nurse call back.

## 2019-04-30 NOTE — Progress Notes (Signed)
Subjective: Kaitlyn Wood presents today referred by Ngetich, [name], NP for complaint of painful mycotic nails b/l that are difficult to trim. Pain interferes with ambulation. Aggravating factors include wearing enclosed shoe gear. Pain is relieved with periodic professional debridement. Denies any numbness, tingling or burning to feet.   Past Medical History:  Diagnosis Date  . Anxiety   . Bipolar 1 disorder ([place])   . Bulging of cervical intervertebral disc    C2  . Bulging of thoracic intervertebral disc    t4  . Depression   . Diabetes mellitus without complication ([place])   . Hyperlipidemia   . PTSD (post-traumatic stress disorder)   . Sad      Patient Active Problem List   Diagnosis Date Noted  . Fever 04/12/2019  . OSA (obstructive sleep apnea) 12/14/2018  . Moderate obesity 12/14/2018  . Frequent falls 09/23/2016  . Insomnia 04/06/2016  . Chronic pain of right knee 04/06/2016  . Type 2 diabetes mellitus without complication, without long-term current use of insulin ([place]) 04/06/2016  . Mixed hyperlipidemia 04/06/2016  . High risk medication use 04/06/2016  . Urge incontinence of urine 04/06/2016  . Elevated BP without diagnosis of hypertension 04/06/2016  . Bipolar affective disorder ([place]) 08/02/2012  . Essential hypertension 08/02/2012  . Posttraumatic stress disorder 08/02/2012  . Osteoarthrosis, pelvic region and thigh 08/02/2012  . History of total hip replacement 08/02/2012  . Anxiety state 08/02/2012  . Encounter for rehabilitation 08/02/2012  . Primary localized osteoarthrosis, pelvic region and thigh 04/07/2011     Past Surgical History:  Procedure Laterality Date  . CATARACT EXTRACTION Right 08/11/2017  . CATARACT EXTRACTION Left 09/01/2017  . EYE SURGERY    . HEMICOLECTOMY  1997  . JOINT REPLACEMENT  2013   hip     Current Outpatient Medications on File Prior to Visit  Medication Sig Dispense Refill  . Accu-Chek FastClix Lancets MISC USE TO TEST  BLOOD SUGAR THREE TIMES DAILY 306 each 11  . ARIPiprazole ER (ABILIFY MAINTENA) 300 MG PRSY prefilled syringe Inject 300 mg into the muscle every 30 (thirty) days. Last injection 04/06/2018    . Ascorbic Acid (VITAMIN C) 1000 MG tablet Take 1,000 mg by mouth daily.    . ASTAXANTHIN PO Take 12 mg by mouth daily.     Jolyne Loa Grape-Goldenseal (BERBERINE COMPLEX PO) Take by mouth. 500 mg 3-4 times daily    . clonazePAM (KLONOPIN) 1 MG tablet 1 mg at night, 1/2 tablet during the day if needed    . gabapentin (NEURONTIN) 300 MG capsule TAKE 1 CAPSULE(300 MG) BY MOUTH THREE TIMES DAILY 270 capsule 1  . glucose blood (ACCU-CHEK GUIDE) test strip Use to test blood sugar three times daily. Dx: E11.9 300 each 3  . lamoTRIgine (LAMICTAL) 200 MG tablet Take 200 mg by mouth 2 (two) times daily.     Marland Kitchen lisinopril (ZESTRIL) 10 MG tablet Take 1 tablet (10 mg total) by mouth daily. 90 tablet 1  . loratadine (CLARITIN) 10 MG tablet Take 1 tablet (10 mg total) by mouth daily. 90 tablet 3  . metFORMIN (GLUCOPHAGE) 500 MG tablet TAKE 1 TABLET(500 MG) BY MOUTH TWICE DAILY WITH A MEAL 180 tablet 1  . sertraline (ZOLOFT) 25 MG tablet Take 25 mg by mouth daily.    . traZODone (DESYREL) 50 MG tablet Take 50 mg by mouth at bedtime as needed.      No current facility-administered medications on file prior to visit.  Allergies  Allergen Reactions  . Oxycodone     Migraines  . Statins     "makes my urine turn brown"     Social History   Occupational History  . Not on file  Tobacco Use  . Smoking status: Never Smoker  . Smokeless tobacco: Never Used  Substance and Sexual Activity  . Alcohol use: No  . Drug use: No  . Sexual activity: Not Currently     Family History  Problem Relation Age of Onset  . Ovarian cancer Mother   . Lymphoma Father   . Colon cancer Sister      Immunization History  Administered Date(s) Administered  . Fluad Quad(high Dose 65+) 12/21/2018  . Influenza,inj,Quad PF,6+  Mos 04/04/2016  . Influenza-Unspecified 12/29/2013, 12/14/2017  . Pneumococcal Conjugate-13 01/04/2016  . Pneumococcal Polysaccharide-23 09/30/2017  . Tdap 09/29/2015  . Zoster Recombinat (Shingrix) 12/05/2017, 06/03/2018     Objective: Vitals:   04/29/19 1109  BP: (!) 149/87  Pulse: 83    Vascular Examination:  Capillary refill time to digits immediate b/l, palpable DP pulses b/l, palpable PT pulses b/l, pedal hair sparse b/l and skin temperature gradient within normal limits b/l  Dermatological Examination: Pedal skin with normal turgor, texture and tone bilaterally, no open wounds bilaterally, no interdigital macerations bilaterally and toenails 1-5 b/l elongated, dystrophic, thickened, crumbly with subungual debris  Musculoskeletal: Normal muscle strength 5/5 to all lower extremity muscle groups bilaterally, no pain crepitus or joint limitation noted with ROM b/l and bunion deformity noted b/l  Neurological: Protective sensation intact 5/5 intact bilaterally with 10g monofilament b/l and vibratory sensation intact b/l  Assessment: 1. Encounter for diabetic foot exam ([place])   2. Pain due to onychomycosis of toenails of both feet   3. Controlled type 2 diabetes mellitus without complication, without long-term current use of insulin ([facility])   4. Hallux valgus, acquired, bilateral      Plan: -Discussed diabetic foot care principles. Literature dispensed on today. -Toenails 1-5 b/l were debrided in length and girth without iatrogenic bleeding. -Patient to continue soft, supportive shoe gear daily. -Patient to report any pedal injuries to medical professional immediately. -Patient/POA to call should there be question/concern in the interim.  Return in about 3 months (around 07/28/2019) for diabetic nail trim.

## 2019-05-04 DIAGNOSIS — L218 Other seborrheic dermatitis: Secondary | ICD-10-CM | POA: Diagnosis not present

## 2019-05-04 DIAGNOSIS — D235 Other benign neoplasm of skin of trunk: Secondary | ICD-10-CM | POA: Diagnosis not present

## 2019-05-04 DIAGNOSIS — L821 Other seborrheic keratosis: Secondary | ICD-10-CM | POA: Diagnosis not present

## 2019-05-04 DIAGNOSIS — L814 Other melanin hyperpigmentation: Secondary | ICD-10-CM | POA: Diagnosis not present

## 2019-05-04 DIAGNOSIS — D1801 Hemangioma of skin and subcutaneous tissue: Secondary | ICD-10-CM | POA: Diagnosis not present

## 2019-05-04 DIAGNOSIS — L989 Disorder of the skin and subcutaneous tissue, unspecified: Secondary | ICD-10-CM | POA: Diagnosis not present

## 2019-05-04 DIAGNOSIS — D485 Neoplasm of uncertain behavior of skin: Secondary | ICD-10-CM | POA: Diagnosis not present

## 2019-05-04 DIAGNOSIS — D225 Melanocytic nevi of trunk: Secondary | ICD-10-CM | POA: Diagnosis not present

## 2019-05-05 ENCOUNTER — Other Ambulatory Visit: Payer: Self-pay | Admitting: *Deleted

## 2019-05-05 DIAGNOSIS — E1169 Type 2 diabetes mellitus with other specified complication: Secondary | ICD-10-CM

## 2019-05-05 DIAGNOSIS — G894 Chronic pain syndrome: Secondary | ICD-10-CM

## 2019-05-05 DIAGNOSIS — I10 Essential (primary) hypertension: Secondary | ICD-10-CM

## 2019-05-05 MED ORDER — LISINOPRIL 10 MG PO TABS: 10.0000 mg | ORAL_TABLET | Freq: Every day | ORAL | 1 refills | Status: DC

## 2019-05-05 MED ORDER — METFORMIN HCL 500 MG PO TABS: ORAL_TABLET | ORAL | 1 refills | Status: DC

## 2019-05-05 MED ORDER — GABAPENTIN 300 MG PO CAPS: ORAL_CAPSULE | ORAL | 1 refills | Status: DC

## 2019-05-05 NOTE — Telephone Encounter (Signed)
Received request from [place].

## 2019-05-06 DIAGNOSIS — G4733 Obstructive sleep apnea (adult) (pediatric): Secondary | ICD-10-CM | POA: Diagnosis not present

## 2019-05-10 DIAGNOSIS — F4310 Post-traumatic stress disorder, unspecified: Secondary | ICD-10-CM | POA: Diagnosis not present

## 2019-05-10 DIAGNOSIS — F310 Bipolar disorder, current episode hypomanic: Secondary | ICD-10-CM | POA: Diagnosis not present

## 2019-05-10 DIAGNOSIS — M25561 Pain in right knee: Secondary | ICD-10-CM | POA: Diagnosis not present

## 2019-05-10 DIAGNOSIS — E119 Type 2 diabetes mellitus without complications: Secondary | ICD-10-CM | POA: Diagnosis not present

## 2019-05-10 DIAGNOSIS — M5124 Other intervertebral disc displacement, thoracic region: Secondary | ICD-10-CM | POA: Diagnosis not present

## 2019-05-10 DIAGNOSIS — F419 Anxiety disorder, unspecified: Secondary | ICD-10-CM | POA: Diagnosis not present

## 2019-05-10 DIAGNOSIS — G8929 Other chronic pain: Secondary | ICD-10-CM | POA: Diagnosis not present

## 2019-05-10 DIAGNOSIS — M5021 Other cervical disc displacement,  high cervical region: Secondary | ICD-10-CM | POA: Diagnosis not present

## 2019-05-10 DIAGNOSIS — I10 Essential (primary) hypertension: Secondary | ICD-10-CM | POA: Diagnosis not present

## 2019-05-15 DIAGNOSIS — E119 Type 2 diabetes mellitus without complications: Secondary | ICD-10-CM | POA: Diagnosis not present

## 2019-05-15 DIAGNOSIS — F4310 Post-traumatic stress disorder, unspecified: Secondary | ICD-10-CM | POA: Diagnosis not present

## 2019-05-15 DIAGNOSIS — F310 Bipolar disorder, current episode hypomanic: Secondary | ICD-10-CM | POA: Diagnosis not present

## 2019-05-15 DIAGNOSIS — F419 Anxiety disorder, unspecified: Secondary | ICD-10-CM | POA: Diagnosis not present

## 2019-05-15 DIAGNOSIS — M5124 Other intervertebral disc displacement, thoracic region: Secondary | ICD-10-CM

## 2019-05-15 DIAGNOSIS — I10 Essential (primary) hypertension: Secondary | ICD-10-CM

## 2019-05-15 DIAGNOSIS — M25561 Pain in right knee: Secondary | ICD-10-CM

## 2019-05-15 DIAGNOSIS — M5021 Other cervical disc displacement,  high cervical region: Secondary | ICD-10-CM

## 2019-05-16 ENCOUNTER — Ambulatory Visit (INDEPENDENT_AMBULATORY_CARE_PROVIDER_SITE_OTHER): Payer: Medicare HMO | Admitting: Family

## 2019-05-16 ENCOUNTER — Other Ambulatory Visit: Payer: Self-pay

## 2019-05-16 ENCOUNTER — Encounter: Payer: Self-pay | Admitting: Family

## 2019-05-16 VITALS — BP 140/80 | HR 96 | Temp 97.8°F | Resp 18 | Ht 63.0 in | Wt 188.6 lb

## 2019-05-16 DIAGNOSIS — L989 Disorder of the skin and subcutaneous tissue, unspecified: Secondary | ICD-10-CM

## 2019-05-16 NOTE — Patient Instructions (Signed)
-   Notify provider for any signs of infections on lesion site.

## 2019-05-16 NOTE — Progress Notes (Signed)
Provider: Marlowe Sax FNP-C  Aileena Iglesia, [name], NP  Patient Care Team: Amahd Morino, [name], NP as PCP - General (Family Medicine) Brien Few, MD as Consulting Physician (Obstetrics and Gynecology) Katy Apo, MD as Consulting Physician (Ophthalmology)  Extended Emergency Contact Information Primary Emergency Contact: [place], [place] Phone: [phone] Relation: Son  Code Status: Full Code  Goals of care: Advanced Directive information Advanced Directives 05/16/2019  Does Patient Have a Medical Advance Directive? No  Does patient want to make changes to medical advance directive? -  Would patient like information on creating a medical advance directive? -     Chief Complaint  Patient presents with  . Acute Visit    Unhealing Lesions     HPI:  Pt is a 69 y.o. female seen today for an acute visit for evaluation of unhealing lesion incision site.she states had left upper chest and upper back lesion removed by [facility] dermatologist on 05/04/2019.she was told incision site should heal in 7 days.she is concerned site have not closed up completely.she was advised to apply Vaseline daily but has been using Neosporin.she denies any fever,chills,redness or drainage from incision site.    Past Medical History:  Diagnosis Date  . Anxiety   . Bipolar 1 disorder ([place])   . Bulging of cervical intervertebral disc    C2  . Bulging of thoracic intervertebral disc    t4  . Depression   . Diabetes mellitus without complication ([place])   . Hyperlipidemia   . PTSD (post-traumatic stress disorder)   . Sad    Past Surgical History:  Procedure Laterality Date  . CATARACT EXTRACTION Right 08/11/2017  . CATARACT EXTRACTION Left 09/01/2017  . EYE SURGERY    . HEMICOLECTOMY  1997  . JOINT REPLACEMENT  2013   hip    Allergies  Allergen Reactions  . Oxycodone     Migraines  . Statins     "makes my urine turn brown"     Outpatient Encounter Medications as of 05/16/2019  Medication Sig  . Accu-Chek FastClix Lancets MISC USE TO TEST BLOOD SUGAR THREE TIMES DAILY  . ARIPiprazole ER (ABILIFY MAINTENA) 300 MG PRSY prefilled syringe Inject 300 mg into the muscle every 30 (thirty) days. Last injection 04/06/2018  . Ascorbic Acid (VITAMIN C) 1000 MG tablet Take 1,000 mg by mouth daily.  . ASTAXANTHIN PO Take 12 mg by mouth daily.   Jolyne Loa Grape-Goldenseal (BERBERINE COMPLEX PO) Take by mouth. 500 mg 3-4 times daily  . clonazePAM (KLONOPIN) 1 MG tablet 1 mg at night, 1/2 tablet during the day if needed  . gabapentin (NEURONTIN) 300 MG capsule Take one capsule by mouth three times daily  . glucose blood (ACCU-CHEK GUIDE) test strip Use to test blood sugar three times daily. Dx: E11.9  . lamoTRIgine (LAMICTAL) 200 MG tablet Take 200 mg by mouth 2 (two) times daily.   Marland Kitchen lisinopril (ZESTRIL) 10 MG tablet Take 1 tablet (10 mg total) by mouth daily.  Marland Kitchen loratadine (CLARITIN) 10 MG tablet Take 1 tablet (10 mg total) by mouth daily.  . metFORMIN (GLUCOPHAGE) 500 MG tablet Take one tablet by mouth twice daily with a meal  . sertraline (ZOLOFT) 25 MG tablet Take 25 mg by mouth daily.  . traZODone (DESYREL) 50 MG tablet Take 50 mg by mouth at bedtime as needed.    No facility-administered encounter medications on file as of 05/16/2019.  Review of Systems  Constitutional: Negative for appetite change, chills, fatigue and fever.  Respiratory: Negative for cough, chest tightness, shortness of breath and wheezing.   Cardiovascular: Negative for chest pain, palpitations and leg swelling.  Skin: Negative for color change, pallor and rash.       Lesion incision site per HPI   Neurological: Negative for dizziness, light-headedness and headaches.  Psychiatric/Behavioral: Negative for agitation and sleep disturbance. The patient is not nervous/anxious.     Immunization History  Administered Date(s) Administered  .  Fluad Quad(high Dose 65+) 12/21/2018  . Influenza,inj,Quad PF,6+ Mos 04/04/2016  . Influenza-Unspecified 12/29/2013, 12/14/2017  . Pneumococcal Conjugate-13 01/04/2016  . Pneumococcal Polysaccharide-23 09/30/2017  . Tdap 09/29/2015  . Zoster Recombinat (Shingrix) 12/05/2017, 06/03/2018   Pertinent  Health Maintenance Due  Topic Date Due  . FOOT EXAM  01/06/2019  . MAMMOGRAM  05/02/2019  . HEMOGLOBIN A1C  10/19/2019  . OPHTHALMOLOGY EXAM  10/22/2019  . COLONOSCOPY  10/18/2028  . INFLUENZA VACCINE  Completed  . DEXA SCAN  Completed  . PNA vac Low Risk Adult  Completed   Fall Risk  04/25/2019 01/18/2019 10/06/2018 08/18/2018 07/12/2018  Falls in the past year? 0 0 0 1 1  Number falls in past yr: 0 0 0 1 1  Comment - - - - -  Injury with Fall? 0 0 0 0 0  Comment - - - - -  Risk for fall due to : - - - History of fall(s) -   Functional Status Survey:    Vitals:   05/16/19 1507  BP: 140/80  Pulse: 96  Resp: 18  Temp: 97.8 F (36.6 C)  SpO2: 98%  Weight: 188 lb 9.6 oz (85.5 kg)  Height: 5\' 3"  (1.6 m)   Body mass index is 33.41 kg/m. Physical Exam Vitals reviewed.  Constitutional:      General: She is not in acute distress.    Appearance: She is obese. She is not ill-appearing.  Eyes:     General: No scleral icterus.       Right eye: No discharge.        Left eye: No discharge.     Conjunctiva/sclera: Conjunctivae normal.     Pupils: Pupils are equal, round, and reactive to light.  Cardiovascular:     Rate and Rhythm: Normal rate and regular rhythm.     Pulses: Normal pulses.     Heart sounds: Normal heart sounds. No murmur. No friction rub. No gallop.   Pulmonary:     Effort: Pulmonary effort is normal. No respiratory distress.     Breath sounds: Normal breath sounds. No wheezing, rhonchi or rales.  Chest:     Chest wall: No tenderness.  Abdominal:     General: Bowel sounds are normal. There is no distension.     Palpations: Abdomen is soft. There is no mass.      Tenderness: There is no abdominal tenderness. There is no right CVA tenderness, left CVA tenderness, guarding or rebound.  Skin:    General: Skin is warm.     Coloration: Skin is not pale.     Findings: No bruising, erythema or rash.     Comments: Left upper chest and left upper back lesion excision site oval shaped shallow punched out without any redness or drainage.surrounding skin without any signs of infection.  incision site cleansed with saline,pat dry and TAB ointment applied and covered with Band aid.  Neurological:     Mental Status: She  is alert and oriented to person, place, and time.     Cranial Nerves: No cranial nerve deficit.     Sensory: No sensory deficit.     Motor: No weakness.  Psychiatric:        Mood and Affect: Mood normal.        Behavior: Behavior normal.        Thought Content: Thought content normal.        Judgment: Judgment normal.    Labs reviewed: Recent Labs    08/16/18 0946 04/21/19 0942  NA 138 138  K 4.7 4.5  CL 102 102  CO2 27 26  GLUCOSE 160* 118*  BUN 19 18  CREATININE 0.94 0.90  CALCIUM 9.7 9.3   Recent Labs    08/16/18 0946 04/21/19 0942  AST 17 14  ALT 18 14  BILITOT 0.3 0.3  PROT 6.8 6.3   Recent Labs    04/21/19 0942  WBC 4.2  NEUTROABS 3,091  HGB 13.2  HCT 40.4  MCV 87.8  PLT 212   Lab Results  Component Value Date   TSH 1.52 04/21/2019   Lab Results  Component Value Date   HGBA1C 6.2 (H) 04/21/2019   Lab Results  Component Value Date   CHOL 219 (H) 04/21/2019   HDL 48 (L) 04/21/2019   LDLCALC 130 (H) 04/21/2019   TRIG 265 (H) 04/21/2019   CHOLHDL 4.6 04/21/2019    Significant Diagnostic Results in last 30 days:  No results found.  Assessment/Plan  Skin lesion Afebrile.Left upper chest and left upper back lesion excision site oval shaped shallow punched out without any redness or drainage.surrounding skin without any signs of infection. - Advised to continue to cleanse incision site as directed by  dermatologist   - Notify provider for any signs of infections on lesion site.   Family/ staff Communication: Reviewed plan of care with patient  Labs/tests ordered: None   Next Appointment: As needed if symptoms worsen or failed to resolve.  Sandrea Hughs, NP

## 2019-05-17 DIAGNOSIS — F3181 Bipolar II disorder: Secondary | ICD-10-CM | POA: Diagnosis not present

## 2019-05-27 DIAGNOSIS — F310 Bipolar disorder, current episode hypomanic: Secondary | ICD-10-CM | POA: Diagnosis not present

## 2019-05-27 DIAGNOSIS — F419 Anxiety disorder, unspecified: Secondary | ICD-10-CM | POA: Diagnosis not present

## 2019-05-27 DIAGNOSIS — M5124 Other intervertebral disc displacement, thoracic region: Secondary | ICD-10-CM | POA: Diagnosis not present

## 2019-05-27 DIAGNOSIS — G8929 Other chronic pain: Secondary | ICD-10-CM | POA: Diagnosis not present

## 2019-05-27 DIAGNOSIS — I10 Essential (primary) hypertension: Secondary | ICD-10-CM | POA: Diagnosis not present

## 2019-05-27 DIAGNOSIS — M25561 Pain in right knee: Secondary | ICD-10-CM | POA: Diagnosis not present

## 2019-05-27 DIAGNOSIS — E119 Type 2 diabetes mellitus without complications: Secondary | ICD-10-CM | POA: Diagnosis not present

## 2019-05-27 DIAGNOSIS — M5021 Other cervical disc displacement,  high cervical region: Secondary | ICD-10-CM | POA: Diagnosis not present

## 2019-05-27 DIAGNOSIS — F4310 Post-traumatic stress disorder, unspecified: Secondary | ICD-10-CM | POA: Diagnosis not present

## 2019-06-03 DIAGNOSIS — G4733 Obstructive sleep apnea (adult) (pediatric): Secondary | ICD-10-CM | POA: Diagnosis not present

## 2019-06-15 ENCOUNTER — Encounter: Payer: Self-pay | Admitting: Adult Health

## 2019-06-15 ENCOUNTER — Other Ambulatory Visit: Payer: Self-pay

## 2019-06-15 ENCOUNTER — Ambulatory Visit: Payer: Medicare HMO | Admitting: Adult Health

## 2019-06-15 DIAGNOSIS — E668 Other obesity: Secondary | ICD-10-CM | POA: Diagnosis not present

## 2019-06-15 DIAGNOSIS — G4733 Obstructive sleep apnea (adult) (pediatric): Secondary | ICD-10-CM

## 2019-06-15 NOTE — Patient Instructions (Signed)
Keep up good work  Continue on CPAP At bedtime   Work on healthy weight  Do not drive if sleepy  Follow up with Dr. Hermina Staggers or Brogan Martis NP in 1 year and as needed

## 2019-06-15 NOTE — Progress Notes (Signed)
f °

## 2019-06-15 NOTE — Assessment & Plan Note (Signed)
Excellent control and compliance on CPAP  Plan  Patient Instructions  Keep up good work  Continue on CPAP At bedtime   Work on healthy weight  Do not drive if sleepy  Follow up with Dr. Hermina Staggers or [place] NP in 1 year and as needed

## 2019-06-15 NOTE — Assessment & Plan Note (Signed)
Healthy weight loss 

## 2019-06-15 NOTE — Progress Notes (Signed)
@Patient  ID: Kaitlyn Wood, female    DOB: [birth year], 69 y.o.   MRN: [id number]  Chief Complaint  Patient presents with  . Follow-up    OSA     Referring provider: Lauree Chandler, Kaitlyn Wood  HPI: 69 year old female seen for sleep consult April 12, 2018 found to have mild sleep apnea  TEST/EVENTS :  Home sleep study 09/2018 mild sleep apnea AHI 8.5/hour  06/15/2019 Follow up : OSA  Patient presents for a 46-month follow-up for sleep apnea.  Patient has recently started CPAP last year after home sleep study showed mild sleep apnea.  Patient says overall she is doing well on CPAP.  She tries to wear it each night.  She feels rested with no significant daytime sleepiness and feels that she benefits from her CPAP.  CPAP download shows good compliance at 83%.  Patient wears her CPAP each night for 6.5 hours.  Patient is on auto CPAP 5 to 15 cm H2O.  Average daily pressure at 12.6 cm H2O.  AHI 2.2.  Positive leaks.  Allergies  Allergen Reactions  . Oxycodone     Migraines  . Statins     "makes my urine turn brown"    Immunization History  Administered Date(s) Administered  . Fluad Quad(high Dose 65+) 12/21/2018  . Influenza,inj,Quad PF,6+ Mos 04/04/2016  . Influenza-Unspecified 12/29/2013, 12/14/2017  . Pneumococcal Conjugate-13 01/04/2016  . Pneumococcal Polysaccharide-23 09/30/2017  . Tdap 09/29/2015  . Zoster Recombinat (Shingrix) 12/05/2017, 06/03/2018    Past Medical History:  Diagnosis Date  . Anxiety   . Bipolar 1 disorder ([place])   . Bulging of cervical intervertebral disc    C2  . Bulging of thoracic intervertebral disc    t4  . Depression   . Diabetes mellitus without complication ([place])   . Hyperlipidemia   . PTSD (post-traumatic stress disorder)   . Sad     Tobacco History: Social History   Tobacco Use  Smoking Status Never Smoker  Smokeless Tobacco Never Used   Counseling given: Not Answered   Outpatient Medications Prior to Visit  Medication Sig  Dispense Refill  . Accu-Chek FastClix Lancets MISC USE TO TEST BLOOD SUGAR THREE TIMES DAILY 306 each 11  . ARIPiprazole ER (ABILIFY MAINTENA) 300 MG PRSY prefilled syringe Inject 300 mg into the muscle every 30 (thirty) days. Last injection 04/06/2018    . Ascorbic Acid (VITAMIN C) 1000 MG tablet Take 1,000 mg by mouth daily.    . ASTAXANTHIN PO Take 12 mg by mouth daily.     Kaitlyn Loa Grape-Goldenseal (BERBERINE COMPLEX PO) Take by mouth. 500 mg 3-4 times daily    . clonazePAM (KLONOPIN) 1 MG tablet 1 mg at night, 1/2 tablet during the day if needed    . gabapentin (NEURONTIN) 300 MG capsule Take one capsule by mouth three times daily 270 capsule 1  . glucose blood (ACCU-CHEK GUIDE) test strip Use to test blood sugar three times daily. Dx: E11.9 300 each 3  . lamoTRIgine (LAMICTAL) 200 MG tablet Take 200 mg by mouth 2 (two) times daily.     Marland Kitchen lisinopril (ZESTRIL) 10 MG tablet Take 1 tablet (10 mg total) by mouth daily. 90 tablet 1  . loratadine (CLARITIN) 10 MG tablet Take 1 tablet (10 mg total) by mouth daily. 90 tablet 3  . metFORMIN (GLUCOPHAGE) 500 MG tablet Take one tablet by mouth twice daily with a meal 180 tablet 1  . sertraline (ZOLOFT) 25 MG tablet Take 25 mg by  mouth daily.    . traZODone (DESYREL) 50 MG tablet Take 50 mg by mouth at bedtime as needed.      No facility-administered medications prior to visit.     Review of Systems:   Constitutional:   No  weight loss, night sweats,  Fevers, chills, fatigue, or  lassitude.  HEENT:   No headaches,  Difficulty swallowing,  Tooth/dental problems, or  Sore throat,                No sneezing, itching, ear ache, nasal congestion, post nasal drip,   CV:  No chest pain,  Orthopnea, PND, swelling in lower extremities, anasarca, dizziness, palpitations, syncope.   GI  No heartburn, indigestion, abdominal pain, nausea, vomiting, diarrhea, change in bowel habits, loss of appetite, bloody stools.   Resp: No shortness of breath with  exertion or at rest.  No excess mucus, no productive cough,  No non-productive cough,  No coughing up of blood.  No change in color of mucus.  No wheezing.  No chest wall deformity  Skin: no rash or lesions.  GU: no dysuria, change in color of urine, no urgency or frequency.  No flank pain, no hematuria   MS:  No joint pain or swelling.  No decreased range of motion.  No back pain.    Physical Exam  BP 104/64 (BP Location: Left Arm, Cuff Size: Normal)   Pulse 99   Temp (!) 97.3 F (36.3 C) (Temporal)   Ht 5\' 3"  (1.6 m)   Wt 183 lb 6.4 oz (83.2 kg)   SpO2 97%   BMI 32.49 kg/m   GEN: A/Ox3; pleasant , NAD, well nourished    HEENT:  Kaitlyn Wood,   NOSE-clear, THROAT-clear, no lesions, no postnasal drip or exudate noted.   NECK:  Supple w/ fair ROM; no JVD; normal carotid impulses w/o bruits; no thyromegaly or nodules palpated; no lymphadenopathy.    RESP  Clear  P & A; w/o, wheezes/ rales/ or rhonchi. no accessory muscle use, no dullness to percussion  CARD:  RRR, no m/r/g, tr peripheral edema, pulses intact, no cyanosis or clubbing.  GI:   Soft & nt; nml bowel sounds; no organomegaly or masses detected.   Musco: Warm bil, no deformities or joint swelling noted.   Neuro: alert, no focal deficits noted.    Skin: Warm, no lesions or rashes    Lab Results:  CBC BNP No results found for: BNP  ProBNP No results found for: PROBNP  Imaging: No results found.    No flowsheet data found.  No results found for: NITRICOXIDE      Assessment & Plan:   OSA (obstructive sleep apnea) Excellent control and compliance on CPAP  Plan  Patient Instructions  Keep up good work  Continue on CPAP At bedtime   Work on healthy weight  Do not drive if sleepy  Follow up with Dr. Hermina Staggers or Kaitlyn Wood in 1 year and as needed      Total patient care time 21 minutes  Kaitlyn Bowlds, Kaitlyn Wood 06/15/2019

## 2019-06-22 DIAGNOSIS — G4733 Obstructive sleep apnea (adult) (pediatric): Secondary | ICD-10-CM | POA: Diagnosis not present

## 2019-06-24 DIAGNOSIS — F310 Bipolar disorder, current episode hypomanic: Secondary | ICD-10-CM | POA: Diagnosis not present

## 2019-06-24 DIAGNOSIS — I10 Essential (primary) hypertension: Secondary | ICD-10-CM | POA: Diagnosis not present

## 2019-06-24 DIAGNOSIS — G8929 Other chronic pain: Secondary | ICD-10-CM | POA: Diagnosis not present

## 2019-06-24 DIAGNOSIS — M25561 Pain in right knee: Secondary | ICD-10-CM | POA: Diagnosis not present

## 2019-06-24 DIAGNOSIS — F419 Anxiety disorder, unspecified: Secondary | ICD-10-CM | POA: Diagnosis not present

## 2019-06-24 DIAGNOSIS — M5124 Other intervertebral disc displacement, thoracic region: Secondary | ICD-10-CM | POA: Diagnosis not present

## 2019-06-24 DIAGNOSIS — F4310 Post-traumatic stress disorder, unspecified: Secondary | ICD-10-CM | POA: Diagnosis not present

## 2019-06-24 DIAGNOSIS — E119 Type 2 diabetes mellitus without complications: Secondary | ICD-10-CM | POA: Diagnosis not present

## 2019-06-24 DIAGNOSIS — M5021 Other cervical disc displacement,  high cervical region: Secondary | ICD-10-CM | POA: Diagnosis not present

## 2019-07-04 DIAGNOSIS — G4733 Obstructive sleep apnea (adult) (pediatric): Secondary | ICD-10-CM | POA: Diagnosis not present

## 2019-07-11 ENCOUNTER — Other Ambulatory Visit: Payer: Self-pay | Admitting: Family

## 2019-07-11 ENCOUNTER — Other Ambulatory Visit: Payer: Self-pay

## 2019-07-11 DIAGNOSIS — I10 Essential (primary) hypertension: Secondary | ICD-10-CM

## 2019-07-11 DIAGNOSIS — F4310 Post-traumatic stress disorder, unspecified: Secondary | ICD-10-CM | POA: Diagnosis not present

## 2019-07-11 DIAGNOSIS — M5021 Other cervical disc displacement,  high cervical region: Secondary | ICD-10-CM | POA: Diagnosis not present

## 2019-07-11 DIAGNOSIS — M5124 Other intervertebral disc displacement, thoracic region: Secondary | ICD-10-CM | POA: Diagnosis not present

## 2019-07-11 DIAGNOSIS — F310 Bipolar disorder, current episode hypomanic: Secondary | ICD-10-CM | POA: Diagnosis not present

## 2019-07-11 DIAGNOSIS — G8929 Other chronic pain: Secondary | ICD-10-CM | POA: Diagnosis not present

## 2019-07-11 DIAGNOSIS — E119 Type 2 diabetes mellitus without complications: Secondary | ICD-10-CM | POA: Diagnosis not present

## 2019-07-11 DIAGNOSIS — M25561 Pain in right knee: Secondary | ICD-10-CM | POA: Diagnosis not present

## 2019-07-11 DIAGNOSIS — F419 Anxiety disorder, unspecified: Secondary | ICD-10-CM | POA: Diagnosis not present

## 2019-07-11 DIAGNOSIS — G4733 Obstructive sleep apnea (adult) (pediatric): Secondary | ICD-10-CM | POA: Diagnosis not present

## 2019-07-11 DIAGNOSIS — E1169 Type 2 diabetes mellitus with other specified complication: Secondary | ICD-10-CM

## 2019-07-11 MED ORDER — ACCU-CHEK GUIDE VI STRP: ORAL_STRIP | 3 refills | Status: DC

## 2019-07-11 NOTE — Telephone Encounter (Signed)
Patient states she normally gets Loratidine but is requesting a prescription for Claritin D for her stuffy nose. She believes she may be allergic to her cat, with no plans to get rid of it.   Patient mentioned she also needs her strips.

## 2019-07-14 DIAGNOSIS — M25561 Pain in right knee: Secondary | ICD-10-CM | POA: Diagnosis not present

## 2019-07-14 DIAGNOSIS — M5021 Other cervical disc displacement,  high cervical region: Secondary | ICD-10-CM

## 2019-07-14 DIAGNOSIS — F310 Bipolar disorder, current episode hypomanic: Secondary | ICD-10-CM | POA: Diagnosis not present

## 2019-07-14 DIAGNOSIS — F3181 Bipolar II disorder: Secondary | ICD-10-CM | POA: Diagnosis not present

## 2019-07-14 DIAGNOSIS — M5124 Other intervertebral disc displacement, thoracic region: Secondary | ICD-10-CM

## 2019-07-14 DIAGNOSIS — I10 Essential (primary) hypertension: Secondary | ICD-10-CM

## 2019-07-14 DIAGNOSIS — E119 Type 2 diabetes mellitus without complications: Secondary | ICD-10-CM | POA: Diagnosis not present

## 2019-07-14 DIAGNOSIS — F4310 Post-traumatic stress disorder, unspecified: Secondary | ICD-10-CM | POA: Diagnosis not present

## 2019-07-14 DIAGNOSIS — F419 Anxiety disorder, unspecified: Secondary | ICD-10-CM | POA: Diagnosis not present

## 2019-07-21 ENCOUNTER — Other Ambulatory Visit: Payer: Self-pay

## 2019-07-21 ENCOUNTER — Encounter: Payer: Self-pay | Admitting: Family

## 2019-07-21 ENCOUNTER — Telehealth (INDEPENDENT_AMBULATORY_CARE_PROVIDER_SITE_OTHER): Payer: Medicare HMO | Admitting: Family

## 2019-07-21 DIAGNOSIS — Z719 Counseling, unspecified: Secondary | ICD-10-CM | POA: Diagnosis not present

## 2019-07-21 DIAGNOSIS — I10 Essential (primary) hypertension: Secondary | ICD-10-CM | POA: Diagnosis not present

## 2019-07-21 DIAGNOSIS — E669 Obesity, unspecified: Secondary | ICD-10-CM

## 2019-07-21 DIAGNOSIS — E119 Type 2 diabetes mellitus without complications: Secondary | ICD-10-CM | POA: Diagnosis not present

## 2019-07-21 NOTE — Patient Instructions (Signed)
- I recommend getting  COVID-19 vaccine due to your high risk medical co-morbidities: Your advance age,Type 2 Diabetes Mellitus,Hypertension and Obesity.  DASH Eating Plan DASH stands for "Dietary Approaches to Stop Hypertension." The DASH eating plan is a healthy eating plan that has been shown to reduce high blood pressure (hypertension). It may also reduce your risk for type 2 diabetes, heart disease, and stroke. The DASH eating plan may also help with weight loss. What are tips for following this plan?  General guidelines  Avoid eating more than 2,300 mg (milligrams) of salt (sodium) a day. If you have hypertension, you may need to reduce your sodium intake to 1,500 mg a day.  Limit alcohol intake to no more than 1 drink a day for nonpregnant women and 2 drinks a day for men. One drink equals 12 oz of beer, 5 oz of wine, or 1 oz of hard liquor.  Work with your health care provider to maintain a healthy body weight or to lose weight. Ask what an ideal weight is for you.  Get at least 30 minutes of exercise that causes your heart to beat faster (aerobic exercise) most days of the week. Activities may include walking, swimming, or biking.  Work with your health care provider or diet and nutrition specialist (dietitian) to adjust your eating plan to your individual calorie needs. Reading food labels   Check food labels for the amount of sodium per serving. Choose foods with less than 5 percent of the Daily Value of sodium. Generally, foods with less than 300 mg of sodium per serving fit into this eating plan.  To find whole grains, look for the word "whole" as the first word in the ingredient list. Shopping  Buy products labeled as "low-sodium" or "no salt added."  Buy fresh foods. Avoid canned foods and premade or frozen meals. Cooking  Avoid adding salt when cooking. Use salt-free seasonings or herbs instead of table salt or sea salt. Check with your health care provider or  pharmacist before using salt substitutes.  Do not fry foods. Cook foods using healthy methods such as baking, boiling, grilling, and broiling instead.  Cook with heart-healthy oils, such as olive, canola, soybean, or sunflower oil. Meal planning  Eat a balanced diet that includes: ? 5 or more servings of fruits and vegetables each day. At each meal, try to fill half of your plate with fruits and vegetables. ? Up to 6-8 servings of whole grains each day. ? Less than 6 oz of lean meat, poultry, or fish each day. A 3-oz serving of meat is about the same size as a deck of cards. One egg equals 1 oz. ? 2 servings of low-fat dairy each day. ? A serving of nuts, seeds, or beans 5 times each week. ? Heart-healthy fats. Healthy fats called Omega-3 fatty acids are found in foods such as flaxseeds and coldwater fish, like sardines, salmon, and mackerel.  Limit how much you eat of the following: ? Canned or prepackaged foods. ? Food that is high in trans fat, such as fried foods. ? Food that is high in saturated fat, such as fatty meat. ? Sweets, desserts, sugary drinks, and other foods with added sugar. ? Full-fat dairy products.  Do not salt foods before eating.  Try to eat at least 2 vegetarian meals each week.  Eat more home-cooked food and less restaurant, buffet, and fast food.  When eating at a restaurant, ask that your food be prepared with less salt  or no salt, if possible. What foods are recommended? The items listed may not be a complete list. Talk with your dietitian about what dietary choices are best for you. Grains Whole-grain or whole-wheat bread. Whole-grain or whole-wheat pasta. Brown rice. [place]. Bulgur. Whole-grain and low-sodium cereals. Pita bread. Low-fat, low-sodium crackers. Whole-wheat flour tortillas. Vegetables Fresh or frozen vegetables (raw, steamed, roasted, or grilled). Low-sodium or reduced-sodium tomato and vegetable juice. Low-sodium or  reduced-sodium tomato sauce and tomato paste. Low-sodium or reduced-sodium canned vegetables. Fruits All fresh, dried, or frozen fruit. Canned fruit in natural juice (without added sugar). Meat and other protein foods Skinless chicken or [place]. Ground chicken or [place]. Pork with fat trimmed off. Fish and seafood. Egg whites. Dried beans, peas, or lentils. Unsalted nuts, nut butters, and seeds. Unsalted canned beans. Lean cuts of beef with fat trimmed off. Low-sodium, lean deli meat. Dairy Low-fat (1%) or fat-free (skim) milk. Fat-free, low-fat, or reduced-fat cheeses. Nonfat, low-sodium ricotta or cottage cheese. Low-fat or nonfat yogurt. Low-fat, low-sodium cheese. Fats and oils Soft margarine without trans fats. Vegetable oil. Low-fat, reduced-fat, or light mayonnaise and salad dressings (reduced-sodium). Canola, safflower, olive, soybean, and sunflower oils. Avocado. Seasoning and other foods Herbs. Spices. Seasoning mixes without salt. Unsalted popcorn and pretzels. Fat-free sweets. What foods are not recommended? The items listed may not be a complete list. Talk with your dietitian about what dietary choices are best for you. Grains Baked goods made with fat, such as croissants, muffins, or some breads. Dry pasta or rice meal packs. Vegetables Creamed or fried vegetables. Vegetables in a cheese sauce. Regular canned vegetables (not low-sodium or reduced-sodium). Regular canned tomato sauce and paste (not low-sodium or reduced-sodium). Regular tomato and vegetable juice (not low-sodium or reduced-sodium). Angie Fava. Olives. Fruits Canned fruit in a light or heavy syrup. Fried fruit. Fruit in cream or butter sauce. Meat and other protein foods Fatty cuts of meat. Ribs. Fried meat. Berniece Salines. Sausage. Bologna and other processed lunch meats. Salami. Fatback. Hotdogs. Bratwurst. Salted nuts and seeds. Canned beans with added salt. Canned or smoked fish. Whole eggs or egg yolks. Chicken or [place]  with skin. Dairy Whole or 2% milk, cream, and half-and-half. Whole or full-fat cream cheese. Whole-fat or sweetened yogurt. Full-fat cheese. Nondairy creamers. Whipped toppings. Processed cheese and cheese spreads. Fats and oils Butter. Stick margarine. Lard. Shortening. Ghee. Bacon fat. Tropical oils, such as coconut, palm kernel, or palm oil. Seasoning and other foods Salted popcorn and pretzels. Onion salt, garlic salt, seasoned salt, table salt, and sea salt. Worcestershire sauce. Tartar sauce. Barbecue sauce. Teriyaki sauce. Soy sauce, including reduced-sodium. Steak sauce. Canned and packaged gravies. Fish sauce. Oyster sauce. Cocktail sauce. Horseradish that you find on the shelf. Ketchup. Mustard. Meat flavorings and tenderizers. Bouillon cubes. Hot sauce and Tabasco sauce. Premade or packaged marinades. Premade or packaged taco seasonings. Relishes. Regular salad dressings. Where to find more information:  National Heart, Lung, and [place]: https://wilson-eaton.com/  American Heart Association: www.heart.org Summary  The DASH eating plan is a healthy eating plan that has been shown to reduce high blood pressure (hypertension). It may also reduce your risk for type 2 diabetes, heart disease, and stroke.  With the DASH eating plan, you should limit salt (sodium) intake to 2,300 mg a day. If you have hypertension, you may need to reduce your sodium intake to 1,500 mg a day.  When on the DASH eating plan, aim to eat more fresh fruits and vegetables, whole grains, lean proteins, low-fat dairy,  and heart-healthy fats.  Work with your health care provider or diet and nutrition specialist (dietitian) to adjust your eating plan to your individual calorie needs. This information is not intended to replace advice given to you by your health care provider. Make sure you discuss any questions you have with your health care provider. Document Revised: 02/27/2017 Document Reviewed:  03/10/2016 Elsevier Patient Education  2020 [employer].

## 2019-07-21 NOTE — Progress Notes (Signed)
Patient ID: Kaitlyn Wood, female   DOB: 1950-05-20, 69 y.o.   MRN: [id number] This service is provided via telemedicine  No vital signs collected/recorded due to the encounter was a telemedicine visit.   Location of patient (ex: home, work):  HOME  Patient consents to a telephone visit:  YES  Location of the provider (ex: office, home):  OFFICE  Name of any referring provider:  [facility] Skye Plamondon, NP  Names of all persons participating in the telemedicine service and their role in the encounter:  PATIENT, Edwin Dada, [place], [facility] Dorrian Doggett, NP  Time spent on call:  5:00   Provider: Dalayza Zambrana FNP-C  Zephaniah Enyeart, [name], NP  Patient Care Team: Jasmaine Rochel, [name], NP as PCP - General (Family Medicine) Brien Few, MD as Consulting Physician (Obstetrics and Gynecology) Katy Apo, MD as Consulting Physician (Ophthalmology)  Extended Emergency Contact Information Primary Emergency Contact: Dwight Mission, [place] Phone: [phone] Relation: Son  Code Status:Full code  Goals of care: Advanced Directive information Advanced Directives 05/16/2019  Does Patient Have a Medical Advance Directive? No  Does patient want to make changes to medical advance directive? -  Would patient like information on creating a medical advance directive? -     Chief Complaint  Patient presents with  . Acute Visit    DISCUSS COVID VACCINE    HPI:  Pt is a 69 y.o. female seen today for an acute visit to discuss COVID-19 vaccine.states would like to get the [employer] vaccine but wants to check whether there are any drug interactions with her medication. Discussed with patient that there no reported drug interaction with her current medication.Also discussed her risk verse benefits of COVID-19 vaccine.she denies any fever,chills,cough.states has chronic allergies taking loratadine daily.   She has a significant COVID-19 risk due to  medical  history of Type 2 Diabetes,Hypeternsion,Obesity and advance age.she understands her high risk condition.  Her medication reviewed. Type 2 Diabetes - latest Hgb A1C 6.2 ( 04/2019).States does use sugary food.Has sugar just for her friend to put in tea but does not use it.States her weakness is salt which she is trying to cut down.On Metformin 500 mg tablet daily.  Hypertension - On Lisinopril daily.she denies any signs of hypotension.trying to cut salt intake as above.  Obesity - no weight check today.latest BMI 33.41.Does not think she has gained weight. Continues with dietary modification.    Past Medical History:  Diagnosis Date  . Anxiety   . Bipolar 1 disorder ([place])   . Bulging of cervical intervertebral disc    C2  . Bulging of thoracic intervertebral disc    t4  . Depression   . Diabetes mellitus without complication ([place])   . Hyperlipidemia   . PTSD (post-traumatic stress disorder)   . Sad    Past Surgical History:  Procedure Laterality Date  . CATARACT EXTRACTION Right 08/11/2017  . CATARACT EXTRACTION Left 09/01/2017  . EYE SURGERY    . HEMICOLECTOMY  1997  . JOINT REPLACEMENT  2013   hip    Allergies  Allergen Reactions  . Oxycodone     Migraines  . Statins     "makes my urine turn brown"    Outpatient Encounter Medications as of 07/21/2019  Medication Sig  . Accu-Chek FastClix Lancets MISC USE TO TEST BLOOD SUGAR THREE TIMES DAILY  . ARIPiprazole ER (ABILIFY MAINTENA) 300 MG PRSY prefilled syringe Inject 300  mg into the muscle every 30 (thirty) days. Last injection 04/06/2018  . Ascorbic Acid (VITAMIN C) 1000 MG tablet Take 1,000 mg by mouth daily.  . ASTAXANTHIN PO Take 12 mg by mouth daily.   Jolyne Loa Grape-Goldenseal (BERBERINE COMPLEX PO) Take by mouth. 500 mg 3-4 times daily  . clonazePAM (KLONOPIN) 1 MG tablet 1 mg at night, 1/2 tablet during the day if needed  . gabapentin (NEURONTIN) 300 MG capsule Take one capsule by mouth three times daily  .  glucose blood (ACCU-CHEK GUIDE) test strip Use to test blood sugar three times daily. Dx: E11.9  . lamoTRIgine (LAMICTAL) 200 MG tablet Take 200 mg by mouth 2 (two) times daily.   Marland Kitchen lisinopril (ZESTRIL) 10 MG tablet TAKE 1 TABLET(10 MG) BY MOUTH DAILY  . loratadine (CLARITIN) 10 MG tablet Take 1 tablet (10 mg total) by mouth daily.  . metFORMIN (GLUCOPHAGE) 500 MG tablet Take one tablet by mouth twice daily with a meal  . sertraline (ZOLOFT) 25 MG tablet Take 25 mg by mouth daily.  . traZODone (DESYREL) 50 MG tablet Take 50 mg by mouth at bedtime as needed.    No facility-administered encounter medications on file as of 07/21/2019.    Review of Systems  Constitutional: Negative for appetite change, chills, fatigue and fever.  HENT:       Chronic seasonal allergies on Loratadine daily   Respiratory: Negative for cough, chest tightness, shortness of breath and wheezing.   Cardiovascular: Negative for chest pain, palpitations and leg swelling.  Gastrointestinal: Negative for abdominal distention, abdominal pain, constipation, diarrhea, nausea and vomiting.  Genitourinary: Negative for difficulty urinating, dysuria, flank pain, frequency and urgency.  Musculoskeletal: Negative for arthralgias, gait problem and myalgias.  Skin: Negative for color change, pallor and rash.  Neurological: Negative for dizziness, speech difficulty, weakness, light-headedness, numbness and headaches.  Psychiatric/Behavioral:       Has [place] Nurse that gives her Abilify injection monthly    Immunization History  Administered Date(s) Administered  . Fluad Quad(high Dose 65+) 12/21/2018  . Influenza,inj,Quad PF,6+ Mos 04/04/2016  . Influenza-Unspecified 12/29/2013, 12/14/2017  . Pneumococcal Conjugate-13 01/04/2016  . Pneumococcal Polysaccharide-23 09/30/2017  . Tdap 09/29/2015  . Zoster Recombinat (Shingrix) 12/05/2017, 06/03/2018   Pertinent  Health Maintenance Due  Topic Date Due  . FOOT EXAM  01/06/2019  .  MAMMOGRAM  05/02/2019  . HEMOGLOBIN A1C  10/19/2019  . OPHTHALMOLOGY EXAM  10/22/2019  . INFLUENZA VACCINE  10/30/2019  . COLONOSCOPY  10/18/2028  . DEXA SCAN  Completed  . PNA vac Low Risk Adult  Completed   Fall Risk  04/25/2019 01/18/2019 10/06/2018 08/18/2018 07/12/2018  Falls in the past year? 0 0 0 1 1  Number falls in past yr: 0 0 0 1 1  Comment - - - - -  Injury with Fall? 0 0 0 0 0  Comment - - - - -  Risk for fall due to : - - - History of fall(s) -   There were no vitals filed for this visit. There is no height or weight on file to calculate BMI. Physical Exam Constitutional:      General: She is not in acute distress.    Appearance: She is obese. She is not ill-appearing.  Eyes:     General: No scleral icterus.       Right eye: No discharge.        Left eye: No discharge.  Pulmonary:     Effort: Pulmonary effort  is normal. No respiratory distress.  Neurological:     Mental Status: She is alert and oriented to person, place, and time.  Psychiatric:        Mood and Affect: Mood normal.        Behavior: Behavior normal.        Thought Content: Thought content normal.    Labs reviewed: Recent Labs    08/16/18 0946 04/21/19 0942  NA 138 138  K 4.7 4.5  CL 102 102  CO2 27 26  GLUCOSE 160* 118*  BUN 19 18  CREATININE 0.94 0.90  CALCIUM 9.7 9.3   Recent Labs    08/16/18 0946 04/21/19 0942  AST 17 14  ALT 18 14  BILITOT 0.3 0.3  PROT 6.8 6.3   Recent Labs    04/21/19 0942  WBC 4.2  NEUTROABS 3,091  HGB 13.2  HCT 40.4  MCV 87.8  PLT 212   Lab Results  Component Value Date   TSH 1.52 04/21/2019   Lab Results  Component Value Date   HGBA1C 6.2 (H) 04/21/2019   Lab Results  Component Value Date   CHOL 219 (H) 04/21/2019   HDL 48 (L) 04/21/2019   LDLCALC 130 (H) 04/21/2019   TRIG 265 (H) 04/21/2019   CHOLHDL 4.6 04/21/2019    Significant Diagnostic Results in last 30 days:  No results found.  Assessment/Plan 1. Encounter for  education Discussed risk verse benefits of COVID-19 due to her medical condition of Type DM,Hypertension,Obesity and her advance age puts her high risk foe [id number] infection complication.I've recommended her to get COVID-19 vaccine.Advised her that she will have medical personnel who will monitor her after she gets her COVID-19 vaccine in vaccine administration sites.she will also be further screening prior to being given her vaccine.she verbalized understanding and plans to get Pfizer vaccine at the [facility]   2. Type 2 diabetes mellitus without complication, without long-term current use of insulin (HCC) Continue on metformin 500 mg tablet daily.   3. Essential hypertension B/p stable on previous visit.No record for review this visit.continue on lisinopril 10 mg tablet daily.   4. Obesity (BMI 30.0-34.9) Encouraged to continue on dietary modification and exercise as tolerated. - DASH diet information provided on after visit summary.   Family/ staff Communication: Reviewed plan of care with patient verbalized understanding.   Labs/tests ordered: None   Next Appointment:  has upcoming appointment in 10/28/2019  Spent 15 minutes of face to face with patient    Sandrea Hughs, NP

## 2019-07-22 DIAGNOSIS — M47812 Spondylosis without myelopathy or radiculopathy, cervical region: Secondary | ICD-10-CM | POA: Diagnosis not present

## 2019-07-22 DIAGNOSIS — E119 Type 2 diabetes mellitus without complications: Secondary | ICD-10-CM | POA: Diagnosis not present

## 2019-07-28 DIAGNOSIS — M25561 Pain in right knee: Secondary | ICD-10-CM | POA: Diagnosis not present

## 2019-07-28 DIAGNOSIS — F4310 Post-traumatic stress disorder, unspecified: Secondary | ICD-10-CM | POA: Diagnosis not present

## 2019-07-28 DIAGNOSIS — M5124 Other intervertebral disc displacement, thoracic region: Secondary | ICD-10-CM | POA: Diagnosis not present

## 2019-07-28 DIAGNOSIS — E119 Type 2 diabetes mellitus without complications: Secondary | ICD-10-CM | POA: Diagnosis not present

## 2019-07-28 DIAGNOSIS — M5021 Other cervical disc displacement,  high cervical region: Secondary | ICD-10-CM | POA: Diagnosis not present

## 2019-07-28 DIAGNOSIS — G8929 Other chronic pain: Secondary | ICD-10-CM | POA: Diagnosis not present

## 2019-07-28 DIAGNOSIS — F310 Bipolar disorder, current episode hypomanic: Secondary | ICD-10-CM | POA: Diagnosis not present

## 2019-07-28 DIAGNOSIS — I10 Essential (primary) hypertension: Secondary | ICD-10-CM | POA: Diagnosis not present

## 2019-07-28 DIAGNOSIS — F419 Anxiety disorder, unspecified: Secondary | ICD-10-CM | POA: Diagnosis not present

## 2019-08-01 ENCOUNTER — Ambulatory Visit: Payer: Medicare HMO | Admitting: Podiatry

## 2019-08-02 DIAGNOSIS — R32 Unspecified urinary incontinence: Secondary | ICD-10-CM | POA: Diagnosis not present

## 2019-08-02 DIAGNOSIS — N816 Rectocele: Secondary | ICD-10-CM | POA: Diagnosis not present

## 2019-08-02 DIAGNOSIS — N8111 Cystocele, midline: Secondary | ICD-10-CM | POA: Diagnosis not present

## 2019-08-03 DIAGNOSIS — G4733 Obstructive sleep apnea (adult) (pediatric): Secondary | ICD-10-CM | POA: Diagnosis not present

## 2019-08-08 DIAGNOSIS — N8111 Cystocele, midline: Secondary | ICD-10-CM | POA: Diagnosis not present

## 2019-08-08 DIAGNOSIS — N816 Rectocele: Secondary | ICD-10-CM | POA: Diagnosis not present

## 2019-08-08 DIAGNOSIS — R32 Unspecified urinary incontinence: Secondary | ICD-10-CM | POA: Diagnosis not present

## 2019-08-12 DIAGNOSIS — M21612 Bunion of left foot: Secondary | ICD-10-CM | POA: Diagnosis not present

## 2019-08-12 DIAGNOSIS — E1351 Other specified diabetes mellitus with diabetic peripheral angiopathy without gangrene: Secondary | ICD-10-CM | POA: Diagnosis not present

## 2019-08-12 DIAGNOSIS — M21611 Bunion of right foot: Secondary | ICD-10-CM | POA: Diagnosis not present

## 2019-08-12 DIAGNOSIS — L602 Onychogryphosis: Secondary | ICD-10-CM | POA: Diagnosis not present

## 2019-08-18 ENCOUNTER — Ambulatory Visit: Payer: Medicare HMO | Attending: Internal Medicine

## 2019-08-18 DIAGNOSIS — Z23 Encounter for immunization: Secondary | ICD-10-CM

## 2019-08-18 NOTE — Progress Notes (Signed)
   Covid-19 Vaccination Clinic  Name:  Amyla Hainsworth    MRN: [id number] DOB: 08-Mar-1951  08/18/2019  Ms. Norville was observed post Covid-19 immunization for 15 minutes without incident. She was provided with Vaccine Information Sheet and instruction to access the V-Safe system.   Ms. Devon was instructed to call 911 with any severe reactions post vaccine: Marland Kitchen Difficulty breathing  . Swelling of face and throat  . A fast heartbeat  . A bad rash all over body  . Dizziness and weakness   Immunizations Administered    Name Date Dose VIS Date Route   Pfizer COVID-19 Vaccine 08/18/2019  3:12 PM 0.3 mL 05/25/2018 Intramuscular   Manufacturer: [employer], [employer]   Lot: [id number]   [place]: [id number]

## 2019-08-25 DIAGNOSIS — M5021 Other cervical disc displacement,  high cervical region: Secondary | ICD-10-CM | POA: Diagnosis not present

## 2019-08-25 DIAGNOSIS — M5124 Other intervertebral disc displacement, thoracic region: Secondary | ICD-10-CM | POA: Diagnosis not present

## 2019-08-25 DIAGNOSIS — F419 Anxiety disorder, unspecified: Secondary | ICD-10-CM | POA: Diagnosis not present

## 2019-08-25 DIAGNOSIS — F4310 Post-traumatic stress disorder, unspecified: Secondary | ICD-10-CM | POA: Diagnosis not present

## 2019-08-25 DIAGNOSIS — E119 Type 2 diabetes mellitus without complications: Secondary | ICD-10-CM | POA: Diagnosis not present

## 2019-08-25 DIAGNOSIS — F310 Bipolar disorder, current episode hypomanic: Secondary | ICD-10-CM | POA: Diagnosis not present

## 2019-08-25 DIAGNOSIS — M25561 Pain in right knee: Secondary | ICD-10-CM | POA: Diagnosis not present

## 2019-08-25 DIAGNOSIS — G8929 Other chronic pain: Secondary | ICD-10-CM | POA: Diagnosis not present

## 2019-08-25 DIAGNOSIS — I10 Essential (primary) hypertension: Secondary | ICD-10-CM | POA: Diagnosis not present

## 2019-09-03 DIAGNOSIS — G4733 Obstructive sleep apnea (adult) (pediatric): Secondary | ICD-10-CM | POA: Diagnosis not present

## 2019-09-07 DIAGNOSIS — M25561 Pain in right knee: Secondary | ICD-10-CM | POA: Diagnosis not present

## 2019-09-07 DIAGNOSIS — F4310 Post-traumatic stress disorder, unspecified: Secondary | ICD-10-CM | POA: Diagnosis not present

## 2019-09-07 DIAGNOSIS — I10 Essential (primary) hypertension: Secondary | ICD-10-CM | POA: Diagnosis not present

## 2019-09-07 DIAGNOSIS — G8929 Other chronic pain: Secondary | ICD-10-CM | POA: Diagnosis not present

## 2019-09-07 DIAGNOSIS — F419 Anxiety disorder, unspecified: Secondary | ICD-10-CM | POA: Diagnosis not present

## 2019-09-07 DIAGNOSIS — F310 Bipolar disorder, current episode hypomanic: Secondary | ICD-10-CM | POA: Diagnosis not present

## 2019-09-07 DIAGNOSIS — E119 Type 2 diabetes mellitus without complications: Secondary | ICD-10-CM | POA: Diagnosis not present

## 2019-09-07 DIAGNOSIS — M5021 Other cervical disc displacement,  high cervical region: Secondary | ICD-10-CM | POA: Diagnosis not present

## 2019-09-07 DIAGNOSIS — M5124 Other intervertebral disc displacement, thoracic region: Secondary | ICD-10-CM | POA: Diagnosis not present

## 2019-09-12 ENCOUNTER — Ambulatory Visit: Payer: Medicare HMO | Attending: Internal Medicine

## 2019-09-12 DIAGNOSIS — Z23 Encounter for immunization: Secondary | ICD-10-CM

## 2019-09-12 DIAGNOSIS — M25561 Pain in right knee: Secondary | ICD-10-CM

## 2019-09-12 DIAGNOSIS — M5124 Other intervertebral disc displacement, thoracic region: Secondary | ICD-10-CM | POA: Diagnosis not present

## 2019-09-12 DIAGNOSIS — M5021 Other cervical disc displacement,  high cervical region: Secondary | ICD-10-CM

## 2019-09-12 DIAGNOSIS — I10 Essential (primary) hypertension: Secondary | ICD-10-CM | POA: Diagnosis not present

## 2019-09-12 DIAGNOSIS — F310 Bipolar disorder, current episode hypomanic: Secondary | ICD-10-CM | POA: Diagnosis not present

## 2019-09-12 DIAGNOSIS — F4310 Post-traumatic stress disorder, unspecified: Secondary | ICD-10-CM | POA: Diagnosis not present

## 2019-09-12 DIAGNOSIS — F419 Anxiety disorder, unspecified: Secondary | ICD-10-CM | POA: Diagnosis not present

## 2019-09-12 DIAGNOSIS — E119 Type 2 diabetes mellitus without complications: Secondary | ICD-10-CM | POA: Diagnosis not present

## 2019-09-12 NOTE — Progress Notes (Signed)
   Covid-19 Vaccination Clinic  Name:  Kaitlyn Wood    MRN: [id number] DOB: December 18, 1950  09/12/2019  Kaitlyn Wood was observed post Covid-19 immunization for 15 minutes without incident. She was provided with Vaccine Information Sheet and instruction to access the V-Safe system.   Kaitlyn Wood was instructed to call 911 with any severe reactions post vaccine: Marland Kitchen Difficulty breathing  . Swelling of face and throat  . A fast heartbeat  . A bad rash all over body  . Dizziness and weakness   Immunizations Administered    Name Date Dose VIS Date Route   Pfizer COVID-19 Vaccine 09/12/2019  2:08 PM 0.3 mL 05/25/2018 Intramuscular   Manufacturer: [employer], [employer]   Lot: [id number]   [place]: [id number]

## 2019-09-13 DIAGNOSIS — F3181 Bipolar II disorder: Secondary | ICD-10-CM | POA: Diagnosis not present

## 2019-09-22 DIAGNOSIS — F4310 Post-traumatic stress disorder, unspecified: Secondary | ICD-10-CM | POA: Diagnosis not present

## 2019-09-22 DIAGNOSIS — F310 Bipolar disorder, current episode hypomanic: Secondary | ICD-10-CM | POA: Diagnosis not present

## 2019-09-22 DIAGNOSIS — M5124 Other intervertebral disc displacement, thoracic region: Secondary | ICD-10-CM | POA: Diagnosis not present

## 2019-09-22 DIAGNOSIS — M5021 Other cervical disc displacement,  high cervical region: Secondary | ICD-10-CM | POA: Diagnosis not present

## 2019-09-22 DIAGNOSIS — I10 Essential (primary) hypertension: Secondary | ICD-10-CM | POA: Diagnosis not present

## 2019-09-22 DIAGNOSIS — E119 Type 2 diabetes mellitus without complications: Secondary | ICD-10-CM | POA: Diagnosis not present

## 2019-09-22 DIAGNOSIS — F419 Anxiety disorder, unspecified: Secondary | ICD-10-CM | POA: Diagnosis not present

## 2019-09-22 DIAGNOSIS — G8929 Other chronic pain: Secondary | ICD-10-CM | POA: Diagnosis not present

## 2019-09-22 DIAGNOSIS — M25561 Pain in right knee: Secondary | ICD-10-CM | POA: Diagnosis not present

## 2019-10-03 DIAGNOSIS — G4733 Obstructive sleep apnea (adult) (pediatric): Secondary | ICD-10-CM | POA: Diagnosis not present

## 2019-10-05 ENCOUNTER — Telehealth: Payer: Self-pay | Admitting: Adult Health

## 2019-10-05 DIAGNOSIS — G4733 Obstructive sleep apnea (adult) (pediatric): Secondary | ICD-10-CM
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
## 2019-10-06 DIAGNOSIS — F3181 Bipolar II disorder: Secondary | ICD-10-CM | POA: Diagnosis not present
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
## 2019-10-06 NOTE — Telephone Encounter (Signed)
Yes CPAP prescription can be sent in

## 2019-10-10 ENCOUNTER — Encounter: Payer: Medicare HMO | Admitting: Nurse Practitioner

## 2019-10-17 ENCOUNTER — Encounter: Payer: Medicare HMO | Admitting: Family

## 2019-10-17 DIAGNOSIS — E1351 Other specified diabetes mellitus with diabetic peripheral angiopathy without gangrene: Secondary | ICD-10-CM | POA: Diagnosis not present

## 2019-10-17 DIAGNOSIS — L84 Corns and callosities: Secondary | ICD-10-CM | POA: Diagnosis not present

## 2019-10-17 DIAGNOSIS — L602 Onychogryphosis: Secondary | ICD-10-CM | POA: Diagnosis not present

## 2019-10-18 ENCOUNTER — Encounter: Payer: Self-pay | Admitting: Family

## 2019-10-18 ENCOUNTER — Ambulatory Visit (INDEPENDENT_AMBULATORY_CARE_PROVIDER_SITE_OTHER): Payer: Medicare HMO | Admitting: Family

## 2019-10-18 ENCOUNTER — Telehealth: Payer: Self-pay

## 2019-10-18 ENCOUNTER — Other Ambulatory Visit: Payer: Self-pay

## 2019-10-18 DIAGNOSIS — Z0000 Encounter for general adult medical examination without abnormal findings: Secondary | ICD-10-CM

## 2019-10-18 NOTE — Telephone Encounter (Signed)
[name], Kaitlyn Wood are scheduled for a virtual visit with your provider today.    Just as we do with appointments in the office, we must obtain your consent to participate.  Your consent will be active for this visit and any virtual visit you may have with one of our providers in the next 365 days.    If you have a MyChart account, I can also send a copy of this consent to you electronically.  All virtual visits are billed to your insurance company just like a traditional visit in the office.  As this is a virtual visit, video technology does not allow for your provider to perform a traditional examination.  This may limit your provider's ability to fully assess your condition.  If your provider identifies any concerns that need to be evaluated in person or the need to arrange testing such as labs, EKG, etc, we will make arrangements to do so.    Although advances in technology are sophisticated, we cannot ensure that it will always work on either your end or our end.  If the connection with a video visit is poor, we may have to switch to a telephone visit.  With either a video or telephone visit, we are not always able to ensure that we have a secure connection.   I need to obtain your verbal consent now.   Are you willing to proceed with your visit today?   Kaitlyn Wood has provided verbal consent on 10/18/2019 for a virtual visit (video or telephone).   [place], [place] 10/18/2019  2:48 PM

## 2019-10-18 NOTE — Progress Notes (Addendum)
Subjective:   Kaitlyn Wood is a 69 y.o. female who presents for Medicare Annual (Subsequent) preventive examination.  Review of Systems    Cardiac Risk Factors include: advanced age (>26mn, >>30women);diabetes mellitus;hypertension;obesity (BMI >30kg/m2)     Objective:    There were no vitals filed for this visit. There is no height or weight on file to calculate BMI.  Advanced Directives 10/18/2019 05/16/2019 10/06/2018 08/18/2018 05/07/2018 04/19/2018 04/08/2018  Does Patient Have a Medical Advance Directive? Yes No No No No Yes No  Type of Advance Directive Living will;Healthcare Power of ATraerOut of facility DNR (pink MOST or yellow form) - - - - - -  Does patient want to make changes to medical advance directive? No - Guardian declined - - - - Yes (Inpatient - patient requests chaplain consult to change a medical advance directive) -  Copy of HEscanabain Chart? No - copy requested - - - - - -  Would patient like information on creating a medical advance directive? No - Patient declined - No - Patient declined Yes (MAU/Ambulatory/Procedural Areas - Information given) - - -    Current Medications (verified) Outpatient Encounter Medications as of 10/18/2019  Medication Sig  . Accu-Chek FastClix Lancets MISC USE TO TEST BLOOD SUGAR THREE TIMES DAILY  . ARIPiprazole ER (ABILIFY MAINTENA) 300 MG PRSY prefilled syringe Inject 300 mg into the muscle every 30 (thirty) days. Last injection 04/06/2018  . Ascorbic Acid (VITAMIN C) 1000 MG tablet Take 1,000 mg by mouth daily.  . ASTAXANTHIN PO Take 12 mg by mouth daily.   .Jolyne LoaGrape-Goldenseal (BERBERINE COMPLEX PO) Take by mouth. 500 mg 3-4 times daily  . benztropine (COGENTIN) 0.5 MG tablet Take 1 tablet by mouth 2 (two) times daily.  . clonazePAM (KLONOPIN) 1 MG tablet 1 mg at night, 1/2 tablet during the day if needed  . gabapentin (NEURONTIN) 300 MG capsule Take one capsule by mouth three times daily  . glucose  blood (ACCU-CHEK GUIDE) test strip Use to test blood sugar three times daily. Dx: E11.9  . lamoTRIgine (LAMICTAL) 200 MG tablet Take 200 mg by mouth 2 (two) times daily.   .Marland Kitchenlisinopril (ZESTRIL) 10 MG tablet TAKE 1 TABLET(10 MG) BY MOUTH DAILY  . loratadine (CLARITIN) 10 MG tablet Take 1 tablet (10 mg total) by mouth daily.  . metFORMIN (GLUCOPHAGE) 500 MG tablet Take one tablet by mouth twice daily with a meal  . sertraline (ZOLOFT) 25 MG tablet Take 25 mg by mouth daily.  . traZODone (DESYREL) 50 MG tablet Take 50 mg by mouth at bedtime as needed.    No facility-administered encounter medications on file as of 10/18/2019.    Allergies (verified) Oxycodone and Statins   History: Past Medical History:  Diagnosis Date  . Anxiety   . Bipolar 1 disorder (HBlanket   . Bulging of cervical intervertebral disc    C2  . Bulging of thoracic intervertebral disc    t4  . Depression   . Diabetes mellitus without complication (HWindthorst   . Hyperlipidemia   . PTSD (post-traumatic stress disorder)   . Sad    Past Surgical History:  Procedure Laterality Date  . CATARACT EXTRACTION Right 08/11/2017  . CATARACT EXTRACTION Left 09/01/2017  . EYE SURGERY    . HEMICOLECTOMY  1997  . JOINT REPLACEMENT  2013   hip   Family History  Problem Relation Age of Onset  . Ovarian cancer Mother   . Lymphoma Father   .  Colon cancer Sister    Social History   Socioeconomic History  . Marital status: Divorced    Spouse name: Not on file  . Number of children: Not on file  . Years of education: Not on file  . Highest education level: Not on file  Occupational History  . Not on file  Tobacco Use  . Smoking status: Never Smoker  . Smokeless tobacco: Never Used  Vaping Use  . Vaping Use: Never used  Substance and Sexual Activity  . Alcohol use: No  . Drug use: No  . Sexual activity: Not Currently  Other Topics Concern  . Not on file  Social History Narrative   Diet? ? Fresh food- very little  prepared food- no fast food.       Do you drink/eat things with caffeine?  Yes      Marital status?         Divorced                           What year were you married? 1978      Do you live in a house, apartment, assisted living, condo, trailer, etc.? apartment      Is it one or more stories? Yes      How many persons live in your home? 1      Do you have any pets in your home? (please list)  1 cat, Polly      Current or past profession: Air traffic controller, sales       Do you exercise?       yes                               Type & how often? 2-3 times a week      Do you have a living will? No      Do you have a DNR form?    no                              If not, do you want to discuss one? yes      Do you have signed POA/HPOA for forms? no   Social Determinants of Health   Financial Resource Strain:   . Difficulty of Paying Living Expenses:   Food Insecurity:   . Worried About Charity fundraiser in the Last Year:   . Arboriculturist in the Last Year:   Transportation Needs:   . Film/video editor (Medical):   Marland Kitchen Lack of Transportation (Non-Medical):   Physical Activity:   . Days of Exercise per Week:   . Minutes of Exercise per Session:   Stress:   . Feeling of Stress :   Social Connections:   . Frequency of Communication with Friends and Family:   . Frequency of Social Gatherings with Friends and Family:   . Attends Religious Services:   . Active Member of Clubs or Organizations:   . Attends Archivist Meetings:   Marland Kitchen Marital Status:     Tobacco Counseling Counseling given: Not Answered   Clinical Intake:  Pre-visit preparation completed: No  Pain : No/denies pain     BMI - recorded: 33.41 Nutritional Status: BMI > 30  Obese Nutritional Risks: None Diabetes: Yes CBG done?: Yes (120 at home) CBG resulted in Enter/ Edit  results?: No Did pt. bring in CBG monitor from home?:  (log 120's)  How often do you need to have someone help  you when you read instructions, pamphlets, or other written materials from your doctor or pharmacy?: 1 - Never What is the last grade level you completed in school?: Maters Degree  Diabetic?yes  Interpreter Needed?: No  Information entered by :: [place] FNP-C   Activities of Daily Living In your present state of health, do you have any difficulty performing the following activities: 10/18/2019  Hearing? N  Vision? N  Difficulty concentrating or making decisions? N  Walking or climbing stairs? N  Dressing or bathing? N  Doing errands, shopping? N  Preparing Food and eating ? N  Using the Toilet? N  In the past six months, have you accidently leaked urine? Y  Do you have problems with loss of bowel control? N  Managing your Medications? N  Managing your Finances? N  Housekeeping or managing your Housekeeping? N  Some recent data might be hidden    Patient Care Team: Jalik Gellatly, [name], NP as PCP - General (Family Medicine) Brien Few, MD as Consulting Physician (Obstetrics and Gynecology) Katy Apo, MD as Consulting Physician (Ophthalmology)  Indicate any recent Medical Services you may have received from other than [facility] providers in the past year (date may be approximate).     Assessment:   This is a routine wellness examination for Kaitlyn Wood.  Hearing/Vision screen  Hearing Screening   '125Hz'  '250Hz'  '500Hz'  '1000Hz'  '2000Hz'  '3000Hz'  '4000Hz'  '6000Hz'  '8000Hz'   Right ear:           Left ear:           Comments: No Hearing Concerns.   Vision Screening Comments: No Vision Concerns.   Dietary issues and exercise activities discussed: Current Exercise Habits: Structured exercise class, Type of exercise: yoga;Other - see comments (Tai chi), Time (Minutes): 45, Frequency (Times/Week): 3, Weekly Exercise (Minutes/Week): 135, Intensity: Moderate, Exercise limited by: None identified  Goals    .  Weight (lb) < 150 lb (68 kg) (pt-stated)      Weight loss due to eating healthy and  exercise.       Depression Screen PHQ 2/9 Scores 10/18/2019 04/25/2019 10/06/2018 02/18/2018 09/30/2017 01/06/2017 01/04/2016  PHQ - 2 Score 0 0 - 0 '6 6 2  ' PHQ- 9 Score - - - - 15 18 -  Exception Documentation - - Other- indicate reason in comment box - - - -  Not completed - - Under the care of specialist - - - -    Fall Risk Fall Risk  10/18/2019 04/25/2019 01/18/2019 10/06/2018 08/18/2018  Falls in the past year? 1 0 0 0 1  Number falls in past yr: 1 0 0 0 1  Comment - - - - -  Injury with Fall? 0 0 0 0 0  Comment - - - - -  Risk for fall due to : - - - - History of fall(s)    Any stairs in or around the home? No  If so, are there any without handrails? No  Home free of loose throw rugs in walkways, pet beds, electrical cords, etc? No  Adequate lighting in your home to reduce risk of falls? Yes   ASSISTIVE DEVICES UTILIZED TO PREVENT FALLS:  Life alert? No  Use of a cane, walker or w/c? No  Grab bars in the bathroom? Yes  Shower chair or bench in shower? Yes  Elevated toilet seat  or a handicapped toilet? No   TIMED UP AND GO:  Was the test performed? No .  Length of time to ambulate 10 feet: N/A  sec.   Gait Stable  Cognitive Function: MMSE - Mini Mental State Exam 10/06/2018 09/30/2017 04/04/2016  Orientation to time '5 5 5  ' Orientation to Place '5 5 5  ' Registration '3 3 3  ' Attention/ Calculation '5 4 5  ' Recall '1 2 2  ' Language- name 2 objects '2 2 2  ' Language- repeat '1 1 1  ' Language- follow 3 step command '3 3 3  ' Language- read & follow direction '1 1 1  ' Write a sentence '1 1 1  ' Copy design 1 0 1  Total score '28 27 29     ' 6CIT Screen 10/18/2019  What Year? 0 points  What month? 0 points  What time? 0 points  Count back from 20 0 points  Months in reverse 2 points  Repeat phrase 2 points  Total Score 4    Immunizations Immunization History  Administered Date(s) Administered  . Fluad Quad(high Dose 65+) 12/21/2018  . Influenza,inj,Quad PF,6+ Mos 04/04/2016  .  Influenza-Unspecified 12/29/2013, 12/14/2017  . PFIZER SARS-COV-2 Vaccination 08/18/2019, 09/12/2019  . Pneumococcal Conjugate-13 01/04/2016  . Pneumococcal Polysaccharide-23 09/30/2017  . Tdap 09/29/2015  . Zoster Recombinat (Shingrix) 12/05/2017, 06/03/2018    TDAP status: Up to date Flu Vaccine status: Up to date Pneumococcal vaccine status: Up to date Covid-19 vaccine status: Completed vaccines  Qualifies for Shingles Vaccine? No   Zostavax completed Yes   Shingrix Completed?: Yes  Screening Tests Health Maintenance  Topic Date Due  . FOOT EXAM  01/06/2019  . MAMMOGRAM  05/02/2019  . HEMOGLOBIN A1C  10/19/2019  . OPHTHALMOLOGY EXAM  10/22/2019  . INFLUENZA VACCINE  10/30/2019  . TETANUS/TDAP  09/28/2025  . COLONOSCOPY  10/18/2028  . DEXA SCAN  Completed  . COVID-19 Vaccine  Completed  . Hepatitis C Screening  Completed  . PNA vac Low Risk Adult  Completed    Health Maintenance  Health Maintenance Due  Topic Date Due  . FOOT EXAM  01/06/2019  . MAMMOGRAM  05/02/2019    Colorectal cancer screening: Completed 10/19/2018 . Repeat every 10 years Mammogram status: Completed 2020 . Repeat every year Bone Density status: Completed 06/25/2017 . Results reflect: Bone density results: OSTEOPENIA. Repeat every 2 years.  Lung Cancer Screening: (Low Dose CT Chest recommended if Age 16-80 years, 30 pack-year currently smoking OR have quit w/in 15years.) does not qualify.   Lung Cancer Screening Referral: N/A   Additional Screening:  Hepatitis C Screening: does not qualify; Completed yes   Vision Screening: Recommended annual ophthalmology exams for early detection of glaucoma and other disorders of the eye. Is the patient up to date with their annual eye exam?  No  Who is the provider or what is the name of the office in which the patient attends annual eye exams? [facility]  If pt is not established with a provider, would they like to be referred to a provider to establish  care? No .   Dental Screening: Recommended annual dental exams for proper oral hygiene  Community Resource Referral / Chronic Care Management: CRR required this visit?  No   CCM required this visit?  No     Plan:  - Had mammogram 2020 and has upcoming appointment done by [facility] mammography.will obtain records.  I have personally reviewed and noted the following in the patient's chart:   . Medical and  social history . Use of alcohol, tobacco or illicit drugs  . Current medications and supplements . Functional ability and status . Nutritional status . Physical activity . Advanced directives . List of other physicians . Hospitalizations, surgeries, and ER visits in previous 12 months . Vitals . Screenings to include cognitive, depression, and falls . Referrals and appointments  In addition, I have reviewed and discussed with patient certain preventive protocols, quality metrics, and best practice recommendations. A written personalized care plan for preventive services as well as general preventive health recommendations were provided to patient.    Sandrea Hughs, NP   10/18/2019   Nurse Notes:- Had mammogram 2020 and has upcoming appointment done by [facility] mammography.will obtain records.  I connected with  Kaitlyn Wood on 10/18/2019 by Telephone  enabled telemedicine application and verified that I am speaking with the correct person using two identifiers.   I discussed the limitations of evaluation and management by telemedicine. The patient expressed understanding and agreed to proceed.

## 2019-10-18 NOTE — Progress Notes (Signed)
    This service is provided via telemedicine  No vital signs collected/recorded due to the encounter was a telemedicine visit.   Location of patient (ex: home, work): Home.  Patient consents to a telephone visit: Yes.  Location of the provider (ex: office, home):  [facility].  Name of any referring provider: N/A  Names of all persons participating in the telemedicine service and their role in the encounter:  Patient, Kaitlyn Wood, RMA, Ngetich, Dinah, NP.    Time spent on call: 8 minutes spent on the phone with Medical Assistant.   

## 2019-10-18 NOTE — Patient Instructions (Signed)
Kaitlyn Wood , Thank you for taking time to come for your Medicare Wellness Visit. I appreciate your ongoing commitment to your health goals. Please review the following plan we discussed and let me know if I can assist you in the future.   Screening recommendations/referrals: Colonoscopy: Up to date -Cologuard  Mammogram: Up to date Bone Density: Up to date Recommended yearly ophthalmology/optometry visit for glaucoma screening and checkup Recommended yearly dental visit for hygiene and checkup  Vaccinations: Influenza vaccine : Up to date Pneumococcal vaccine : Up to date Tdap vaccine: Up to date Shingles vaccine : Up to date  Advanced directives: yes   Conditions/risks identified: Advance age female > 34 yrs,type 2 DM,Hypertension,BMI > 30   Next appointment: 1 year    Preventive Care 53 Years and Older, Female Preventive care refers to lifestyle choices and visits with your health care provider that can promote health and wellness. What does preventive care include?  A yearly physical exam. This is also called an annual well check.  Dental exams once or twice a year.  Routine eye exams. Ask your health care provider how often you should have your eyes checked.  Personal lifestyle choices, including:  Daily care of your teeth and gums.  Regular physical activity.  Eating a healthy diet.  Avoiding tobacco and drug use.  Limiting alcohol use.  Practicing safe sex.  Taking low-dose aspirin every day.  Taking vitamin and mineral supplements as recommended by your health care provider. What happens during an annual well check? The services and screenings done by your health care provider during your annual well check will depend on your age, overall health, lifestyle risk factors, and family history of disease. Counseling  Your health care provider may ask you questions about your:  Alcohol use.  Tobacco use.  Drug use.  Emotional well-being.  Home and  relationship well-being.  Sexual activity.  Eating habits.  History of falls.  Memory and ability to understand (cognition).  Work and work Statistician.  Reproductive health. Screening  You may have the following tests or measurements:  Height, weight, and BMI.  Blood pressure.  Lipid and cholesterol levels. These may be checked every 5 years, or more frequently if you are over 29 years old.  Skin check.  Lung cancer screening. You may have this screening every year starting at age 69 if you have a 30-pack-year history of smoking and currently smoke or have quit within the past 15 years.  Fecal occult blood test (FOBT) of the stool. You may have this test every year starting at age 32.  Flexible sigmoidoscopy or colonoscopy. You may have a sigmoidoscopy every 5 years or a colonoscopy every 10 years starting at age 27.  Hepatitis C blood test.  Hepatitis B blood test.  Sexually transmitted disease (STD) testing.  Diabetes screening. This is done by checking your blood sugar (glucose) after you have not eaten for a while (fasting). You may have this done every 1-3 years.  Bone density scan. This is done to screen for osteoporosis. You may have this done starting at age 9.  Mammogram. This may be done every 1-2 years. Talk to your health care provider about how often you should have regular mammograms. Talk with your health care provider about your test results, treatment options, and if necessary, the need for more tests. Vaccines  Your health care provider may recommend certain vaccines, such as:  Influenza vaccine. This is recommended every year.  Tetanus, diphtheria, and acellular  pertussis (Tdap, Td) vaccine. You may need a Td booster every 10 years.  Zoster vaccine. You may need this after age 7.  Pneumococcal 13-valent conjugate (PCV13) vaccine. One dose is recommended after age 62.  Pneumococcal polysaccharide (PPSV23) vaccine. One dose is recommended after  age 69. Talk to your health care provider about which screenings and vaccines you need and how often you need them. This information is not intended to replace advice given to you by your health care provider. Make sure you discuss any questions you have with your health care provider. Document Released: 04/13/2015 Document Revised: 12/05/2015 Document Reviewed: 01/16/2015 Elsevier Interactive Patient Education  2017 [place] Prevention in the Home Falls can cause injuries. They can happen to people of all ages. There are many things you can do to make your home safe and to help prevent falls. What can I do on the outside of my home?  Regularly fix the edges of walkways and driveways and fix any cracks.  Remove anything that might make you trip as you walk through a door, such as a raised step or threshold.  Trim any bushes or trees on the path to your home.  Use bright outdoor lighting.  Clear any walking paths of anything that might make someone trip, such as rocks or tools.  Regularly check to see if handrails are loose or broken. Make sure that both sides of any steps have handrails.  Any raised decks and porches should have guardrails on the edges.  Have any leaves, snow, or ice cleared regularly.  Use sand or salt on walking paths during winter.  Clean up any spills in your garage right away. This includes oil or grease spills. What can I do in the bathroom?  Use night lights.  Install grab bars by the toilet and in the tub and shower. Do not use towel bars as grab bars.  Use non-skid mats or decals in the tub or shower.  If you need to sit down in the shower, use a plastic, non-slip stool.  Keep the floor dry. Clean up any water that spills on the floor as soon as it happens.  Remove soap buildup in the tub or shower regularly.  Attach bath mats securely with double-sided non-slip rug tape.  Do not have throw rugs and other things on the floor that can  make you trip. What can I do in the bedroom?  Use night lights.  Make sure that you have a light by your bed that is easy to reach.  Do not use any sheets or blankets that are too big for your bed. They should not hang down onto the floor.  Have a firm chair that has side arms. You can use this for support while you get dressed.  Do not have throw rugs and other things on the floor that can make you trip. What can I do in the kitchen?  Clean up any spills right away.  Avoid walking on wet floors.  Keep items that you use a lot in easy-to-reach places.  If you need to reach something above you, use a strong step stool that has a grab bar.  Keep electrical cords out of the way.  Do not use floor polish or wax that makes floors slippery. If you must use wax, use non-skid floor wax.  Do not have throw rugs and other things on the floor that can make you trip. What can I do with my stairs?  Do not leave any items on the stairs.  Make sure that there are handrails on both sides of the stairs and use them. Fix handrails that are broken or loose. Make sure that handrails are as long as the stairways.  Check any carpeting to make sure that it is firmly attached to the stairs. Fix any carpet that is loose or worn.  Avoid having throw rugs at the top or bottom of the stairs. If you do have throw rugs, attach them to the floor with carpet tape.  Make sure that you have a light switch at the top of the stairs and the bottom of the stairs. If you do not have them, ask someone to add them for you. What else can I do to help prevent falls?  Wear shoes that:  Do not have high heels.  Have rubber bottoms.  Are comfortable and fit you well.  Are closed at the toe. Do not wear sandals.  If you use a stepladder:  Make sure that it is fully opened. Do not climb a closed stepladder.  Make sure that both sides of the stepladder are locked into place.  Ask someone to hold it for you,  if possible.  Clearly mark and make sure that you can see:  Any grab bars or handrails.  First and last steps.  Where the edge of each step is.  Use tools that help you move around (mobility aids) if they are needed. These include:  Canes.  Walkers.  Scooters.  Crutches.  Turn on the lights when you go into a dark area. Replace any light bulbs as soon as they burn out.  Set up your furniture so you have a clear path. Avoid moving your furniture around.  If any of your floors are uneven, fix them.  If there are any pets around you, be aware of where they are.  Review your medicines with your doctor. Some medicines can make you feel dizzy. This can increase your chance of falling. Ask your doctor what other things that you can do to help prevent falls. This information is not intended to replace advice given to you by your health care provider. Make sure you discuss any questions you have with your health care provider. Document Released: 01/11/2009 Document Revised: 08/23/2015 Document Reviewed: 04/21/2014 Elsevier Interactive Patient Education  2017 [employer].

## 2019-10-19 ENCOUNTER — Telehealth: Payer: Self-pay

## 2019-10-19 NOTE — Telephone Encounter (Signed)
Noted.thanks You.

## 2019-10-19 NOTE — Telephone Encounter (Signed)
I called the patient and spoke with her about the mammogram she said she has an appointment on  November 07 2019

## 2019-10-19 NOTE — Telephone Encounter (Signed)
-----   Message from Sandrea Hughs, NP sent at 10/18/2019  5:06 PM EDT ----- Please Notify patient that mammogram was not done in 2020.we can order mammogram if okay with patient . Thanks  Dinah  ----- Message ----- From: Otis Peak, CMA Sent: 10/18/2019   1:32 PM EDT To: Sandrea Hughs, NP  Call office and they're faxing over patient results. They also stated that patient didn't have any Mammogram done in 2020 but they have documents from 2019. I will placed these faxes in your folder once they come over.  ----- Message ----- From: Sandrea Hughs, NP Sent: 10/18/2019  11:02 AM EDT To: Otis Peak, [place], Please call [facility] to obtain patient's recent mammogram.she states was done last year 2020.Dexa scan if they have one too.  Thanks  Dinah Ngetich FNP-C

## 2019-10-24 ENCOUNTER — Other Ambulatory Visit: Payer: Medicare HMO

## 2019-10-24 ENCOUNTER — Other Ambulatory Visit: Payer: Self-pay

## 2019-10-24 DIAGNOSIS — E782 Mixed hyperlipidemia: Secondary | ICD-10-CM | POA: Diagnosis not present

## 2019-10-24 DIAGNOSIS — I10 Essential (primary) hypertension: Secondary | ICD-10-CM | POA: Diagnosis not present

## 2019-10-24 DIAGNOSIS — E119 Type 2 diabetes mellitus without complications: Secondary | ICD-10-CM

## 2019-10-25 LAB — COMPLETE METABOLIC PANEL WITH GFR
AG Ratio: 2.1 (calc) (ref 1.0–2.5)
ALT: 15 U/L (ref 6–29)
AST: 15 U/L (ref 10–35)
Albumin: 4.6 g/dL (ref 3.6–5.1)
Alkaline phosphatase (APISO): 82 U/L (ref 37–153)
BUN/Creatinine Ratio: 29 (calc) — ABNORMAL HIGH (ref 6–22)
BUN: 29 mg/dL — ABNORMAL HIGH (ref 7–25)
CO2: 19 mmol/L — ABNORMAL LOW (ref 20–32)
Calcium: 9.3 mg/dL (ref 8.6–10.4)
Chloride: 102 mmol/L (ref 98–110)
Creat: 1.01 mg/dL — ABNORMAL HIGH (ref 0.60–0.93)
GFR, Est African American: 65 mL/min/{1.73_m2} (ref >=60)
GFR, Est Non African American: 56 mL/min/{1.73_m2} — ABNORMAL LOW (ref >=60)
Globulin: 2.2 g/dL (calc) (ref 1.9–3.7)
Glucose, Bld: 133 mg/dL — ABNORMAL HIGH (ref 65–99)
Potassium: 4.4 mmol/L (ref 3.5–5.3)
Sodium: 136 mmol/L (ref 135–146)
Total Bilirubin: 0.5 mg/dL (ref 0.2–1.2)
Total Protein: 6.8 g/dL (ref 6.1–8.1)

## 2019-10-25 LAB — CBC WITH DIFFERENTIAL/PLATELET
Absolute Monocytes: 281 cells/uL (ref 200–950)
Basophils Absolute: 10 cells/uL (ref 0–200)
Basophils Relative: 0.2 %
Eosinophils Absolute: 0 cells/uL — ABNORMAL LOW (ref 15–500)
Eosinophils Relative: 0 %
HCT: 40.5 % (ref 35.0–45.0)
Hemoglobin: 13.3 g/dL (ref 11.7–15.5)
Lymphs Abs: 1253 cells/uL (ref 850–3900)
MCH: 29.8 pg (ref 27.0–33.0)
MCHC: 32.8 g/dL (ref 32.0–36.0)
MCV: 90.6 fL (ref 80.0–100.0)
MPV: 10.2 fL (ref 7.5–12.5)
Monocytes Relative: 5.4 %
Neutro Abs: 3656 cells/uL (ref 1500–7800)
Neutrophils Relative %: 70.3 %
Platelets: 224 10*3/uL (ref 140–400)
RBC: 4.47 10*6/uL (ref 3.80–5.10)
RDW: 12.5 % (ref 11.0–15.0)
Total Lymphocyte: 24.1 %
WBC: 5.2 10*3/uL (ref 3.8–10.8)

## 2019-10-25 LAB — HEMOGLOBIN A1C
Hgb A1c MFr Bld: 6.1 % of total Hgb — ABNORMAL HIGH (ref <5.7)
Mean Plasma Glucose: 128 (calc)
eAG (mmol/L): 7.1 (calc)

## 2019-10-25 LAB — LIPID PANEL
Cholesterol: 273 mg/dL — ABNORMAL HIGH (ref <200)
HDL: 56 mg/dL (ref >=50)
LDL Cholesterol (Calc): 173 mg/dL (calc) — ABNORMAL HIGH
Non-HDL Cholesterol (Calc): 217 mg/dL (calc) — ABNORMAL HIGH (ref <130)
Total CHOL/HDL Ratio: 4.9 (calc) (ref <5.0)
Triglycerides: 270 mg/dL — ABNORMAL HIGH (ref <150)

## 2019-10-25 LAB — TSH: TSH: 2.1 mIU/L (ref 0.40–4.50)

## 2019-10-27 DIAGNOSIS — M5021 Other cervical disc displacement,  high cervical region: Secondary | ICD-10-CM | POA: Diagnosis not present

## 2019-10-27 DIAGNOSIS — M25561 Pain in right knee: Secondary | ICD-10-CM | POA: Diagnosis not present

## 2019-10-27 DIAGNOSIS — E119 Type 2 diabetes mellitus without complications: Secondary | ICD-10-CM | POA: Diagnosis not present

## 2019-10-27 DIAGNOSIS — F4310 Post-traumatic stress disorder, unspecified: Secondary | ICD-10-CM | POA: Diagnosis not present

## 2019-10-27 DIAGNOSIS — G8929 Other chronic pain: Secondary | ICD-10-CM | POA: Diagnosis not present

## 2019-10-27 DIAGNOSIS — I10 Essential (primary) hypertension: Secondary | ICD-10-CM | POA: Diagnosis not present

## 2019-10-27 DIAGNOSIS — M5124 Other intervertebral disc displacement, thoracic region: Secondary | ICD-10-CM | POA: Diagnosis not present

## 2019-10-27 DIAGNOSIS — F310 Bipolar disorder, current episode hypomanic: Secondary | ICD-10-CM | POA: Diagnosis not present

## 2019-10-27 DIAGNOSIS — F419 Anxiety disorder, unspecified: Secondary | ICD-10-CM | POA: Diagnosis not present

## 2019-10-28 ENCOUNTER — Ambulatory Visit: Payer: Medicare HMO | Admitting: Family

## 2019-11-01 ENCOUNTER — Ambulatory Visit (INDEPENDENT_AMBULATORY_CARE_PROVIDER_SITE_OTHER): Payer: Medicare HMO | Admitting: Family

## 2019-11-01 ENCOUNTER — Ambulatory Visit: Payer: Medicare HMO | Admitting: Family

## 2019-11-01 ENCOUNTER — Other Ambulatory Visit: Payer: Self-pay

## 2019-11-01 ENCOUNTER — Encounter: Payer: Self-pay | Admitting: Family

## 2019-11-01 VITALS — BP 118/70 | HR 96 | Temp 97.1°F | Resp 16 | Ht 63.0 in | Wt 190.8 lb

## 2019-11-01 DIAGNOSIS — I10 Essential (primary) hypertension: Secondary | ICD-10-CM | POA: Diagnosis not present

## 2019-11-01 DIAGNOSIS — E119 Type 2 diabetes mellitus without complications: Secondary | ICD-10-CM

## 2019-11-01 DIAGNOSIS — J302 Other seasonal allergic rhinitis: Secondary | ICD-10-CM | POA: Diagnosis not present

## 2019-11-01 DIAGNOSIS — F411 Generalized anxiety disorder: Secondary | ICD-10-CM

## 2019-11-01 DIAGNOSIS — E782 Mixed hyperlipidemia: Secondary | ICD-10-CM | POA: Diagnosis not present

## 2019-11-01 DIAGNOSIS — N3944 Nocturnal enuresis: Secondary | ICD-10-CM | POA: Diagnosis not present

## 2019-11-01 DIAGNOSIS — F310 Bipolar disorder, current episode hypomanic: Secondary | ICD-10-CM | POA: Diagnosis not present

## 2019-11-01 DIAGNOSIS — R350 Frequency of micturition: Secondary | ICD-10-CM | POA: Diagnosis not present

## 2019-11-01 DIAGNOSIS — N3946 Mixed incontinence: Secondary | ICD-10-CM | POA: Diagnosis not present

## 2019-11-01 DIAGNOSIS — Z789 Other specified health status: Secondary | ICD-10-CM

## 2019-11-01 NOTE — Progress Notes (Signed)
Provider: Marlowe Sax FNP-C   Kaitlyn Wood, Kaitlyn Bucks, NP  Patient Care Team: Aniela Caniglia, Kaitlyn Bucks, NP as PCP - General (Family Medicine) Brien Few, MD as Consulting Physician (Obstetrics and Gynecology) Katy Apo, MD as Consulting Physician (Ophthalmology)  Extended Emergency Contact Information Primary Emergency Contact: [name], [place] [employer] [place] [employer] Phone: [phone] Relation: Son  Code Status: Full Code  Goals of care: Advanced Directive information Advanced Directives 11/01/2019  Does Patient Have a Medical Advance Directive? Yes  Type of Advance Directive Living will;Healthcare Power of [place];Out of facility DNR (pink MOST or yellow form)  Does patient want to make changes to medical advance directive? No - Patient declined  Copy of [place] in Chart? Yes - validated most recent copy scanned in chart (See row information)  Would patient like information on creating a medical advance directive? -     Chief Complaint  Patient presents with   Medical Management of Chronic Issues    6 Month Follow Up   Health Maintenance    Discuss the need for Mammogram, Eye Exam, and Foot Exam.   Immunizations    Discuss the need for Influenze Vaccine.     HPI:  Pt is a 69 y.o. female seen today for 6 Month follow up for medical management of chronic diseases.States had tremors was treated with cogentin by Pyschiatry states was not refilled.Tremors resolved.unc;ear how many mg she was taking No med list for evaluation.she will discuss need for cogentin with her Psychiatry.  Type 2 DM - CBG in the 110's at home.A1C 6.1 has improved compared to previous.Had foot exam with podiatrist every months.she will schedule for her eye exam.she is on metformin 500 mg tablet twice daily.   HTN - B/p running 130's/80's on lisinopril 10 mg tablet daily.No symptoms of hypotension reported.   Hyperlipidemia -cholesterol 273,TRG  270,LDL 173 has not been watching her diet due to depression.Has not been eating much vegetables.currently not on any statin.States took Statin but made her urine turn brown which was one of the side effects that she read about Statin so she stopped.she didn't not have any muscle aches or weakness.    Allergies - has itchy eyes and sneezing.Loratadine. 10 mg tablet effective. No fever or chills.Has sinus congestion  In the morning but clears up as the day goes.  Anxiety - Klonopin 1 mg tablet effective. Follow up with Psychiatry service.  Bipolar- overall feels good but some days feels depressed.sometimes angry.Takes Clonopin which helps.Also on lamoTRIgine 200 mg tablet twice daily ,Abilify 300 mg I.M every 30 days and Setraline 25 mg tablet daily.  Insomnia - Trazodone 50 mg tablet at bedtime as needed has been    effective. Sleeps 8-9 hrs per night.       Mammogram schedule for 11/07/2019.she will schedule appointment for Influenza vaccine when available.  States had a foot exam two weeks ago with Podiatrist.  Past Medical History:  Diagnosis Date   Anxiety    Bipolar 1 disorder (Kaitlyn Wood)    Bulging of cervical intervertebral disc    C2   Bulging of thoracic intervertebral disc    t4   Depression    Diabetes mellitus without complication (HCC)    Hyperlipidemia    PTSD (post-traumatic stress disorder)    Sad    Past Surgical History:  Procedure Laterality Date   CATARACT EXTRACTION Right 08/11/2017   CATARACT EXTRACTION Left 09/01/2017  [facility]   JOINT REPLACEMENT  2013   hip    Allergies  Allergen Reactions   Oxycodone     Migraines   Statins     "makes my urine turn brown"    Allergies as of 11/01/2019      Reactions   Oxycodone    Migraines   Statins    "makes my urine turn brown"      Medication List       Accurate as of November 01, 2019 10:22 AM. If you have any questions, ask your nurse or doctor.        STOP  taking these medications   benztropine 0.5 MG tablet Commonly known as: COGENTIN Stopped by: Sandrea Hughs, NP     TAKE these medications   Accu-Chek FastClix Lancets Misc USE TO TEST BLOOD SUGAR THREE TIMES DAILY   Accu-Chek Guide test strip Generic drug: glucose blood Use to test blood sugar three times daily. Dx: E11.9   ARIPiprazole ER 300 MG Prsy prefilled syringe Commonly known as: ABILIFY MAINTENA Inject 300 mg into the muscle every 30 (thirty) days. Last injection 10/28/2019   ASTAXANTHIN PO Take 12 mg by mouth daily.   BERBERINE COMPLEX PO Take by mouth. 500 mg 3-4 times daily   clonazePAM 1 MG tablet Commonly known as: KLONOPIN 1 mg at night, 1/2 tablet during the day if needed   gabapentin 300 MG capsule Commonly known as: NEURONTIN Take one capsule by mouth three times daily   lamoTRIgine 200 MG tablet Commonly known as: LAMICTAL Take 200 mg by mouth 2 (two) times daily.   lisinopril 10 MG tablet Commonly known as: ZESTRIL TAKE 1 TABLET(10 MG) BY MOUTH DAILY   loratadine 10 MG tablet Commonly known as: CLARITIN Take 1 tablet (10 mg total) by mouth daily.   metFORMIN 500 MG tablet Commonly known as: GLUCOPHAGE Take one tablet by mouth twice daily with a meal   sertraline 25 MG tablet Commonly known as: ZOLOFT Take 25 mg by mouth daily.   traZODone 50 MG tablet Commonly known as: DESYREL Take 50 mg by mouth at bedtime as needed.   vitamin C 1000 MG tablet Take 1,000 mg by mouth daily.       Review of Systems  Constitutional: Negative for appetite change, chills and fatigue.  HENT: Negative for postnasal drip, rhinorrhea, sinus pressure, sinus pain, sneezing, sore throat and trouble swallowing.        Chronic nasal allergies   Eyes: Negative for discharge, redness and itching.  Respiratory: Negative for cough, chest tightness, shortness of breath and wheezing.   Cardiovascular: Negative for chest pain, palpitations and leg swelling.   Gastrointestinal: Negative for abdominal distention, abdominal pain, constipation, diarrhea, nausea and vomiting.       When she does not eat then eats she gets diarrhea described as loose stool.none today.   Endocrine: Negative for cold intolerance, heat intolerance, polydipsia, polyphagia and polyuria.  Genitourinary: Negative for decreased urine volume, difficulty urinating, dysuria, flank pain, frequency, urgency, vaginal bleeding, vaginal discharge and vaginal pain.       Has to bear down when voiding.follows up with gyn for possible uterus mild prolapse   Musculoskeletal: Positive for neck pain. Negative for arthralgias, back pain, gait problem, joint swelling and myalgias.       Gabapentin and CBD oil effective   Skin: Negative for color change, pallor and rash.  Neurological: Negative for dizziness, speech difficulty,  weakness, light-headedness, numbness and headaches.  Hematological: Does not bruise/bleed easily.  Psychiatric/Behavioral: Positive for sleep disturbance. Negative for agitation. The patient is nervous/anxious.     Immunization History  Administered Date(s) Administered   Fluad Quad(high Dose 65+) 12/21/2018   Influenza,inj,Quad PF,6+ Mos 04/04/2016   Influenza-Unspecified 12/29/2013, 12/14/2017   PFIZER SARS-COV-2 Vaccination 08/18/2019, 09/12/2019   Pneumococcal Conjugate-13 01/04/2016   Pneumococcal Polysaccharide-23 09/30/2017   Tdap 09/29/2015   Zoster Recombinat (Shingrix) 12/05/2017, 06/03/2018   Pertinent  Health Maintenance Due  Topic Date Due   FOOT EXAM  01/06/2019   MAMMOGRAM  05/02/2019   OPHTHALMOLOGY EXAM  10/22/2019   INFLUENZA VACCINE  10/30/2019   HEMOGLOBIN A1C  04/25/2020   COLONOSCOPY  10/18/2028   DEXA SCAN  Completed   PNA vac Low Risk Adult  Completed   Fall Risk  11/01/2019 10/18/2019 04/25/2019 01/18/2019 10/06/2018  Falls in the past year? 1 1 0 0 0  Number falls in past yr: 1 1 0 0 0  Comment - - - - -  Injury with  Fall? 1 0 0 0 0  Comment - - - - -  Risk for fall due to : - - - - -    Vitals:   11/01/19 1003  BP: 118/70  Pulse: 96  Resp: 16  Temp: (!) 97.1 F (36.2 C)  SpO2: 97%  Weight: 190 lb 12.8 oz (86.5 kg)  Height: '5\' 3"'  (1.6 m)   Body mass index is 33.8 kg/m. Physical Exam Vitals reviewed.  Constitutional:      General: She is not in acute distress.    Appearance: She is obese. She is not ill-appearing.  HENT:     Head: Normocephalic.     Right Ear: Tympanic membrane, ear canal and external ear normal. There is no impacted cerumen.     Left Ear: Tympanic membrane, ear canal and external ear normal. There is no impacted cerumen.     Nose: Nose normal. No congestion or rhinorrhea.     Mouth/Throat:     Mouth: Mucous membranes are moist.     Pharynx: Oropharynx is clear. No oropharyngeal exudate or posterior oropharyngeal erythema.  Eyes:     General: No scleral icterus.       Right eye: No discharge.        Left eye: No discharge.     Extraocular Movements: Extraocular movements intact.     Conjunctiva/sclera: Conjunctivae normal.     Pupils: Pupils are equal, round, and reactive to light.  Neck:     Vascular: No carotid bruit.  Cardiovascular:     Rate and Rhythm: Normal rate and regular rhythm.     Pulses: Normal pulses.     Heart sounds: Normal heart sounds. No murmur heard.  No friction rub. No gallop.   Pulmonary:     Effort: Pulmonary effort is normal. No respiratory distress.     Breath sounds: Normal breath sounds. No wheezing, rhonchi or rales.  Chest:     Chest wall: No tenderness.  Abdominal:     General: Bowel sounds are normal. There is no distension.     Palpations: Abdomen is soft. There is no mass.     Tenderness: There is no abdominal tenderness. There is no right CVA tenderness, left CVA tenderness, guarding or rebound.  Musculoskeletal:        General: No swelling or tenderness. Normal range of motion.     Cervical back: Normal range of motion. No  rigidity  or tenderness.     Right lower leg: No edema.     Left lower leg: No edema.  Lymphadenopathy:     Cervical: No cervical adenopathy.  Skin:    General: Skin is warm.     Coloration: Skin is not pale.     Findings: No bruising, erythema or rash.  Neurological:     Mental Status: She is alert and oriented to person, place, and time.     Cranial Nerves: No cranial nerve deficit.     Sensory: No sensory deficit.     Motor: No weakness.     Coordination: Coordination normal.     Gait: Gait normal.  Psychiatric:        Mood and Affect: Mood normal.        Speech: Speech normal.        Behavior: Behavior normal.        Thought Content: Thought content normal.        Judgment: Judgment normal.     Labs reviewed: Recent Labs    04/21/19 0942 10/24/19 0931  NA 138 136  K 4.5 4.4  CL 102 102  CO2 26 19*  GLUCOSE 118* 133*  BUN 18 29*  CREATININE 0.90 1.01*  CALCIUM 9.3 9.3   Recent Labs    04/21/19 0942 10/24/19 0931  AST 14 15  ALT 14 15  BILITOT 0.3 0.5  PROT 6.3 6.8   Recent Labs    04/21/19 0942 10/24/19 0931  WBC 4.2 5.2  NEUTROABS 3,091 3,656  HGB 13.2 13.3  HCT 40.4 40.5  MCV 87.8 90.6  PLT 212 224   Lab Results  Component Value Date   TSH 2.10 10/24/2019   Lab Results  Component Value Date   HGBA1C 6.1 (H) 10/24/2019   Lab Results  Component Value Date   CHOL 273 (H) 10/24/2019   HDL 56 10/24/2019   LDLCALC 173 (H) 10/24/2019   TRIG 270 (H) 10/24/2019   CHOLHDL 4.9 10/24/2019    Significant Diagnostic Results in last 30 days:  No results found.  Assessment/Plan 1. Essential hypertension B/p well controlled.continue on Lisinopril 10 mg tablet daily. - CBC with Differential/Platelet; Future - CMP with eGFR([facility]); Future - TSH; Future  2. Type 2 diabetes mellitus without complication, without long-term current use of insulin (HCC) Lab Results  Component Value Date   HGBA1C 6.1 (H) 10/24/2019  CBG controlled. - continue on  metformin 500 mg tablet twice daily. - continue on ACE inhibitor for renal protection.  - up to date with annual foot exam.Has upcoming appointment for eye exam.she will get Flu shot when available.Has completed COVID-19 Pfizer vaccine.  - Not on statin  - Hemoglobin A1c; Future  3. Mixed hyperlipidemia LDL not at gaol.intolerant to statin. Decline any anti lipid medication states will watch her diet and get back to aerobic exercise. - Lipid panel; Future  4. Generalized anxiety disorder Stable.continue on Klonopin 1 mg tablet daily and Setraline 25 mg tablet daily.  5. Bipolar affective disorder, current episode hypomanic (HCC) Mood stable.Continue on lamoTRIgine 200 mg tablet twice daily ,Abilify 300 mg I.M every 30 days and Setraline 25 mg tablet daily. - TSH; Future  6. Seasonal allergic rhinitis, unspecified trigger Continue on loratadine 10 mg tablet daily.   7.Statin Intolerance  Reports brown urine color with use of statin which is one of the side effects of Statin.No muscle aches or muscle pain.   Family/ staff Communication: Reviewed plan of care with patient  verbalized understanding.   Labs/tests ordered:  - CBC with Differential/Platelet; Future - CMP with eGFR([facility]); Future - TSH; Future - Hemoglobin A1c; Future - Lipid panel; Future - TSH; Future  Next Appointment : 6 months for medical management of chronic issues.Fasting labs in 2-4 days prior to visit.  Sandrea Hughs, NP

## 2019-11-03 DIAGNOSIS — G4733 Obstructive sleep apnea (adult) (pediatric): Secondary | ICD-10-CM | POA: Diagnosis not present

## 2019-11-07 DIAGNOSIS — Z1231 Encounter for screening mammogram for malignant neoplasm of breast: Secondary | ICD-10-CM | POA: Diagnosis not present

## 2019-11-07 DIAGNOSIS — Z01419 Encounter for gynecological examination (general) (routine) without abnormal findings: Secondary | ICD-10-CM | POA: Diagnosis not present

## 2019-11-08 ENCOUNTER — Telehealth: Payer: Self-pay

## 2019-11-08 DIAGNOSIS — F310 Bipolar disorder, current episode hypomanic: Secondary | ICD-10-CM | POA: Diagnosis not present

## 2019-11-08 DIAGNOSIS — M25561 Pain in right knee: Secondary | ICD-10-CM | POA: Diagnosis not present

## 2019-11-08 DIAGNOSIS — G8929 Other chronic pain: Secondary | ICD-10-CM | POA: Diagnosis not present

## 2019-11-08 DIAGNOSIS — M5124 Other intervertebral disc displacement, thoracic region: Secondary | ICD-10-CM | POA: Diagnosis not present

## 2019-11-08 DIAGNOSIS — E119 Type 2 diabetes mellitus without complications: Secondary | ICD-10-CM | POA: Diagnosis not present

## 2019-11-08 DIAGNOSIS — F4310 Post-traumatic stress disorder, unspecified: Secondary | ICD-10-CM | POA: Diagnosis not present

## 2019-11-08 DIAGNOSIS — I10 Essential (primary) hypertension: Secondary | ICD-10-CM | POA: Diagnosis not present

## 2019-11-08 DIAGNOSIS — M5021 Other cervical disc displacement,  high cervical region: Secondary | ICD-10-CM | POA: Diagnosis not present

## 2019-11-08 DIAGNOSIS — F419 Anxiety disorder, unspecified: Secondary | ICD-10-CM | POA: Diagnosis not present

## 2019-11-08 NOTE — Telephone Encounter (Signed)
Per Marlowe Sax, NP patient was called and told Mammogram results are normal. Patient notified to repeat Mammogram in 1 year. Paper placed in "to be scanned".

## 2019-11-10 ENCOUNTER — Other Ambulatory Visit: Payer: Self-pay | Admitting: Family

## 2019-11-10 ENCOUNTER — Telehealth: Payer: Self-pay | Admitting: *Deleted

## 2019-11-10 DIAGNOSIS — I10 Essential (primary) hypertension: Secondary | ICD-10-CM

## 2019-11-10 DIAGNOSIS — E1169 Type 2 diabetes mellitus with other specified complication: Secondary | ICD-10-CM

## 2019-11-10 NOTE — Telephone Encounter (Signed)
Start on Metoprolol succinate 25 mg tablet one by mouth daily.Hold if Heart rate is less than 60 b/min.  Check Blood pressure and record.please bring log for evaluation in 2 weeks.Need office visit.

## 2019-11-10 NOTE — Telephone Encounter (Signed)
Okay 

## 2019-11-10 NOTE — Telephone Encounter (Signed)
On hold for appointment tomorrow 11/11/2019 with Marlowe Sax, NP.

## 2019-11-10 NOTE — Telephone Encounter (Signed)
Patient called and stated that you told her to call if her Blood pressure runs around the 140's.  Patient stated that it has been running 140/88-100. (Patient has NOT been keeping a record.) Taking blood pressure at least and hour after taking medication. Patient stated that she has been under a lot of stress lately.   Wants to know if her medication needs to be increased.  Please Advise.

## 2019-11-10 NOTE — Telephone Encounter (Signed)
Patient scheduled an appointment with Dinah for tomorrow 8/13 and stated that she will discuss this with Dinah at appointment tomorrow.

## 2019-11-11 ENCOUNTER — Other Ambulatory Visit: Payer: Self-pay

## 2019-11-11 ENCOUNTER — Ambulatory Visit (INDEPENDENT_AMBULATORY_CARE_PROVIDER_SITE_OTHER): Payer: Medicare HMO | Admitting: Family

## 2019-11-11 ENCOUNTER — Encounter: Payer: Self-pay | Admitting: Family

## 2019-11-11 VITALS — BP 136/74 | HR 86 | Temp 97.5°F | Resp 16 | Ht 63.0 in | Wt 190.6 lb

## 2019-11-11 DIAGNOSIS — R519 Headache, unspecified: Secondary | ICD-10-CM | POA: Diagnosis not present

## 2019-11-11 DIAGNOSIS — G8929 Other chronic pain: Secondary | ICD-10-CM

## 2019-11-11 DIAGNOSIS — H5713 Ocular pain, bilateral: Secondary | ICD-10-CM | POA: Diagnosis not present

## 2019-11-11 DIAGNOSIS — F310 Bipolar disorder, current episode hypomanic: Secondary | ICD-10-CM

## 2019-11-11 DIAGNOSIS — M5124 Other intervertebral disc displacement, thoracic region: Secondary | ICD-10-CM

## 2019-11-11 DIAGNOSIS — E119 Type 2 diabetes mellitus without complications: Secondary | ICD-10-CM

## 2019-11-11 DIAGNOSIS — F419 Anxiety disorder, unspecified: Secondary | ICD-10-CM

## 2019-11-11 DIAGNOSIS — F4310 Post-traumatic stress disorder, unspecified: Secondary | ICD-10-CM

## 2019-11-11 DIAGNOSIS — M5021 Other cervical disc displacement,  high cervical region: Secondary | ICD-10-CM

## 2019-11-11 DIAGNOSIS — M25561 Pain in right knee: Secondary | ICD-10-CM

## 2019-11-11 DIAGNOSIS — I10 Essential (primary) hypertension: Secondary | ICD-10-CM

## 2019-11-11 MED ORDER — KETOROLAC TROMETHAMINE 30 MG/ML IJ SOLN: 30.0000 mg | Freq: Once | INTRAMUSCULAR | Status: DC

## 2019-11-11 MED ORDER — KETOROLAC TROMETHAMINE 30 MG/ML IJ SOLN
30.0000 mg | Freq: Once | INTRAMUSCULAR | Status: AC
  Administered 2019-11-11: 30 mg via INTRAMUSCULAR

## 2019-11-11 NOTE — Patient Instructions (Signed)
General Headache Without Cause A headache is pain or discomfort that is felt around the head or neck area. There are many causes and types of headaches. In some cases, the cause may not be found. Follow these instructions at home: Watch your condition for any changes. Let your doctor know about them. Take these steps to help with your condition: Managing pain      Take over-the-counter and prescription medicines only as told by your doctor.  Lie down in a dark, quiet room when you have a headache.  If told, put ice on your head and neck area: ? Put ice in a plastic bag. ? Place a towel between your skin and the bag. ? Leave the ice on for 20 minutes, 2-3 times per day.  If told, put heat on the affected area. Use the heat source that your doctor recommends, such as a moist heat pack or a heating pad. ? Place a towel between your skin and the heat source. ? Leave the heat on for 20-30 minutes. ? Remove the heat if your skin turns bright red. This is very important if you are unable to feel pain, heat, or cold. You may have a greater risk of getting burned.  Keep lights dim if bright lights bother you or make your headaches worse. Eating and drinking  Eat meals on a regular schedule.  If you drink alcohol: ? Limit how much you use to:  0-1 drink a day for women.  0-2 drinks a day for men. ? Be aware of how much alcohol is in your drink. In the [place] one drink equals one 12 oz bottle of beer (355 mL), one 5 oz glass of wine (148 mL), or one 1 oz glass of hard liquor (44 mL).  Stop drinking caffeine, or reduce how much caffeine you drink. General instructions   Keep a journal to find out if certain things bring on headaches. For example, write down: ? What you eat and drink. ? How much sleep you get. ? Any change to your diet or medicines.  Get a massage or try other ways to relax.  Limit stress.  Sit up straight. Do not tighten (tense) your muscles.  Do not use any  products that contain nicotine or tobacco. This includes cigarettes, e-cigarettes, and chewing tobacco. If you need help quitting, ask your doctor.  Exercise regularly as told by your doctor.  Get enough sleep. This often means 7-9 hours of sleep each night.  Keep all follow-up visits as told by your doctor. This is important. Contact a doctor if:  Your symptoms are not helped by medicine.  You have a headache that feels different than the other headaches.  You feel sick to your stomach (nauseous) or you throw up (vomit).  You have a fever. Get help right away if:  Your headache gets very bad quickly.  Your headache gets worse after a lot of physical activity.  You keep throwing up.  You have a stiff neck.  You have trouble seeing.  You have trouble speaking.  You have pain in the eye or ear.  Your muscles are weak or you lose muscle control.  You lose your balance or have trouble walking.  You feel like you will pass out (faint) or you pass out.  You are mixed up (confused).  You have a seizure. Summary  A headache is pain or discomfort that is felt around the head or neck area.  There are many causes and   types of headaches. In some cases, the cause may not be found.  Keep a journal to help find out what causes your headaches. Watch your condition for any changes. Let your doctor know about them.  Contact a doctor if you have a headache that is different from usual, or if your headache is not helped by medicine.  Get help right away if your headache gets very bad, you throw up, you have trouble seeing, you lose your balance, or you have a seizure. This information is not intended to replace advice given to you by your health care provider. Make sure you discuss any questions you have with your health care provider. Document Revised: 10/05/2017 Document Reviewed: 10/05/2017 Elsevier Patient Education  2020 [employer]  

## 2019-11-11 NOTE — Progress Notes (Signed)
Provider: Marlowe Sax FNP-C  Corby Villasenor, [name], NP  Patient Care Team: Brittan Mapel, [name], NP as PCP - General (Family Medicine) Brien Few, MD as Consulting Physician (Obstetrics and Gynecology) Katy Apo, MD as Consulting Physician (Ophthalmology)  Extended Emergency Contact Information Primary Emergency Contact: [place], [place] Phone: [phone] Relation: Son  Code Status:  Full Code  Goals of care: Advanced Directive information Advanced Directives 11/11/2019  Does Patient Have a Medical Advance Directive? Yes  Type of Paramedic of [place];Living will;Out of facility DNR (pink MOST or yellow form)  Does patient want to make changes to medical advance directive? No - Patient declined  Copy of [place] in Chart? Yes - validated most recent copy scanned in chart (See row information)  Would patient like information on creating a medical advance directive? -     Chief Complaint  Patient presents with  . Acute Visit    Complains of headache x 4days.    HPI:  Pt is a 69 y.o. female seen today for an acute visit for evaluation of headaches x 4 days. She states hurts from the top of the head to the back and down to her spine.Headache is associated with pain in the eyes.she denies any nausea ,vomiting or dizziness.she has tried over the counter medication but did not take any analgesic today since she was coming for a visit.No precipitating factors though states used to have headaches a lot when young " hormonal".tordal injection helped in the past.  Blood pressure at home varies in the 140's/80's and yesterday SBP was in the 200's .blood pressure today here is within normal range.HR in the 102 rechecked down to 86 b/min after rest.    Past Medical History:  Diagnosis Date  . Anxiety   . Bipolar 1 disorder ([place])   . Bulging of cervical intervertebral disc    C2  .  Bulging of thoracic intervertebral disc    t4  . Depression   . Diabetes mellitus without complication ([place])   . Hyperlipidemia   . PTSD (post-traumatic stress disorder)   . Sad    Past Surgical History:  Procedure Laterality Date  . CATARACT EXTRACTION Right 08/11/2017  . CATARACT EXTRACTION Left 09/01/2017  . EYE SURGERY    . HEMICOLECTOMY  1997  . JOINT REPLACEMENT  2013   hip    Allergies  Allergen Reactions  . Oxycodone     Migraines  . Statins     "makes my urine turn brown"    Outpatient Encounter Medications as of 11/11/2019  Medication Sig  . Accu-Chek FastClix Lancets MISC USE TO TEST BLOOD SUGAR THREE TIMES DAILY  . ARIPiprazole ER (ABILIFY MAINTENA) 300 MG PRSY prefilled syringe Inject 300 mg into the muscle every 30 (thirty) days. Last injection 10/28/2019  . Ascorbic Acid (VITAMIN C) 1000 MG tablet Take 1,000 mg by mouth daily.  . ASTAXANTHIN PO Take 12 mg by mouth daily.   Jolyne Loa Grape-Goldenseal (BERBERINE COMPLEX PO) Take by mouth. 500 mg 3-4 times daily  . clonazePAM (KLONOPIN) 1 MG tablet 1 mg at night, 1/2 tablet during the day if needed  . gabapentin (NEURONTIN) 300 MG capsule Take one capsule by mouth three times daily  . glucose blood (ACCU-CHEK GUIDE) test strip Use to test blood sugar three times daily. Dx: E11.9  . lamoTRIgine (LAMICTAL) 200 MG tablet Take 200 mg by  mouth 2 (two) times daily.   Marland Kitchen lisinopril (ZESTRIL) 10 MG tablet TAKE 1 TABLET EVERY DAY  . loratadine (CLARITIN) 10 MG tablet Take 1 tablet (10 mg total) by mouth daily.  . metFORMIN (GLUCOPHAGE) 500 MG tablet TAKE 1 TABLET TWICE DAILY WITH MEALS  . sertraline (ZOLOFT) 25 MG tablet Take 25 mg by mouth daily.  . traZODone (DESYREL) 50 MG tablet Take 50 mg by mouth at bedtime as needed.    No facility-administered encounter medications on file as of 11/11/2019.    Review of Systems  Constitutional: Negative for appetite change, chills, fatigue and fever.  HENT: Negative for  congestion, rhinorrhea, sinus pressure, sinus pain, sneezing and sore throat.        PND in the morning   Eyes: Negative for pain, discharge, redness and itching.  Respiratory: Negative for cough, chest tightness, shortness of breath and wheezing.   Cardiovascular: Negative for chest pain, palpitations and leg swelling.  Skin: Negative for color change, pallor and rash.  Neurological: Positive for headaches. Negative for dizziness, seizures, speech difficulty, weakness, light-headedness and numbness.  Psychiatric/Behavioral: Negative for agitation, behavioral problems, confusion, hallucinations and sleep disturbance. The patient is nervous/anxious.     Immunization History  Administered Date(s) Administered  . Fluad Quad(high Dose 65+) 12/21/2018  . Influenza,inj,Quad PF,6+ Mos 04/04/2016  . Influenza-Unspecified 12/29/2013, 12/14/2017  . PFIZER SARS-COV-2 Vaccination 08/18/2019, 09/12/2019  . Pneumococcal Conjugate-13 01/04/2016  . Pneumococcal Polysaccharide-23 09/30/2017  . Tdap 09/29/2015  . Zoster Recombinat (Shingrix) 12/05/2017, 06/03/2018   Pertinent  Health Maintenance Due  Topic Date Due  . FOOT EXAM  01/06/2019  . MAMMOGRAM  05/02/2019  . OPHTHALMOLOGY EXAM  10/22/2019  . INFLUENZA VACCINE  10/30/2019  . HEMOGLOBIN A1C  04/25/2020  . COLONOSCOPY  10/18/2028  . DEXA SCAN  Completed  . PNA vac Low Risk Adult  Completed   Fall Risk  11/11/2019 11/01/2019 10/18/2019 04/25/2019 01/18/2019  Falls in the past year? 1 1 1  0 0  Number falls in past yr: 0 1 1 0 0  Comment - - - - -  Injury with Fall? 0 1 0 0 0  Comment - - - - -  Risk for fall due to : - - - - -       Vitals:   11/11/19 1304  BP: 136/74  Pulse: (!) 102  Resp: 16  Temp: (!) 97.5 F (36.4 C)  SpO2: 97%  Weight: 190 lb 9.6 oz (86.5 kg)  Height: 5\' 3"  (1.6 m)   Body mass index is 33.76 kg/m. Physical Exam Vitals reviewed.  Constitutional:      General: She is not in acute distress.    Appearance:  She is not ill-appearing.  HENT:     Head: Normocephalic.     Nose: No congestion or rhinorrhea.     Mouth/Throat:     Mouth: Mucous membranes are moist.     Pharynx: Oropharynx is clear. No oropharyngeal exudate or posterior oropharyngeal erythema.  Eyes:     General: No scleral icterus.       Right eye: No discharge.        Left eye: No discharge.     Conjunctiva/sclera: Conjunctivae normal.     Pupils: Pupils are equal, round, and reactive to light.  Neck:     Vascular: No carotid bruit.  Cardiovascular:     Rate and Rhythm: Normal rate and regular rhythm.     Pulses: Normal pulses.     Heart  sounds: Normal heart sounds. No murmur heard.  No friction rub. No gallop.   Pulmonary:     Effort: Pulmonary effort is normal. No respiratory distress.     Breath sounds: Normal breath sounds. No wheezing, rhonchi or rales.  Chest:     Chest wall: No tenderness.  Abdominal:     General: Bowel sounds are normal. There is no distension.     Palpations: Abdomen is soft. There is no mass.     Tenderness: There is no abdominal tenderness. There is no right CVA tenderness, left CVA tenderness, guarding or rebound.  Musculoskeletal:        General: No swelling or tenderness. Normal range of motion.     Cervical back: Normal range of motion. No rigidity or tenderness.     Right lower leg: No edema.     Left lower leg: No edema.  Lymphadenopathy:     Cervical: No cervical adenopathy.  Skin:    General: Skin is warm and dry.     Coloration: Skin is not pale.     Findings: No bruising or erythema.  Neurological:     Mental Status: She is alert and oriented to person, place, and time.     Cranial Nerves: No cranial nerve deficit.     Sensory: No sensory deficit.     Motor: No weakness.     Gait: Gait normal.  Psychiatric:        Mood and Affect: Mood normal.        Behavior: Behavior normal.        Thought Content: Thought content normal.        Judgment: Judgment normal.    Labs  reviewed: Recent Labs    04/21/19 0942 10/24/19 0931  NA 138 136  K 4.5 4.4  CL 102 102  CO2 26 19*  GLUCOSE 118* 133*  BUN 18 29*  CREATININE 0.90 1.01*  CALCIUM 9.3 9.3   Recent Labs    04/21/19 0942 10/24/19 0931  AST 14 15  ALT 14 15  BILITOT 0.3 0.5  PROT 6.3 6.8   Recent Labs    04/21/19 0942 10/24/19 0931  WBC 4.2 5.2  NEUTROABS 3,091 3,656  HGB 13.2 13.3  HCT 40.4 40.5  MCV 87.8 90.6  PLT 212 224   Lab Results  Component Value Date   TSH 2.10 10/24/2019   Lab Results  Component Value Date   HGBA1C 6.1 (H) 10/24/2019   Lab Results  Component Value Date   CHOL 273 (H) 10/24/2019   HDL 56 10/24/2019   LDLCALC 173 (H) 10/24/2019   TRIG 270 (H) 10/24/2019   CHOLHDL 4.9 10/24/2019    Significant Diagnostic Results in last 30 days:  No results found.  Assessment/Plan 1. Essential hypertension B/p readings elevated at home but normal at the office.will order new blood pressure machine for home use script printed to take to her pharmacy. - For home use only DME Other see comment: Blood pressure cuff machine to check blood pressure daily and record.   2. Chronic nonintractable headache, unspecified headache type Pain worst in the past 4 days.Toradol administered during visit and rested in the room with lights off with much relief.will consider adding Topamax or Maxalt  if migraine headaches persist.continue on OTC analgesic as needed.    - ketorolac (TORADOL) 30 MG/ML injection 30 mg - additional education material provided on AVS   3. Eye pain, bilateral No signs of infection noted.Possible migraine related.will refer to  Ophthalmology for further evaluation. - Ambulatory referral to Ophthalmology  Family/ staff Communication: Reviewed plan of care with patient verbalized understanding.   Labs/tests ordered: None   Next Appointment: 3 weeks for Annual Physical Exam   Sandrea Hughs, NP

## 2019-11-20 DIAGNOSIS — Z789 Other specified health status: Secondary | ICD-10-CM | POA: Insufficient documentation

## 2019-11-23 DIAGNOSIS — H5713 Ocular pain, bilateral: Secondary | ICD-10-CM | POA: Diagnosis not present

## 2019-11-25 DIAGNOSIS — G8929 Other chronic pain: Secondary | ICD-10-CM | POA: Diagnosis not present

## 2019-11-25 DIAGNOSIS — M5124 Other intervertebral disc displacement, thoracic region: Secondary | ICD-10-CM | POA: Diagnosis not present

## 2019-11-25 DIAGNOSIS — M25561 Pain in right knee: Secondary | ICD-10-CM | POA: Diagnosis not present

## 2019-11-25 DIAGNOSIS — F4310 Post-traumatic stress disorder, unspecified: Secondary | ICD-10-CM | POA: Diagnosis not present

## 2019-11-25 DIAGNOSIS — M5021 Other cervical disc displacement,  high cervical region: Secondary | ICD-10-CM | POA: Diagnosis not present

## 2019-11-25 DIAGNOSIS — F419 Anxiety disorder, unspecified: Secondary | ICD-10-CM | POA: Diagnosis not present

## 2019-11-25 DIAGNOSIS — I10 Essential (primary) hypertension: Secondary | ICD-10-CM | POA: Diagnosis not present

## 2019-11-25 DIAGNOSIS — E119 Type 2 diabetes mellitus without complications: Secondary | ICD-10-CM | POA: Diagnosis not present

## 2019-11-25 DIAGNOSIS — F310 Bipolar disorder, current episode hypomanic: Secondary | ICD-10-CM | POA: Diagnosis not present

## 2019-11-30 DIAGNOSIS — F3181 Bipolar II disorder: Secondary | ICD-10-CM | POA: Diagnosis not present

## 2019-12-02 ENCOUNTER — Other Ambulatory Visit: Payer: Self-pay

## 2019-12-02 ENCOUNTER — Ambulatory Visit (INDEPENDENT_AMBULATORY_CARE_PROVIDER_SITE_OTHER): Payer: Medicare HMO | Admitting: Family

## 2019-12-02 ENCOUNTER — Encounter: Payer: Self-pay | Admitting: Family

## 2019-12-02 VITALS — BP 132/82 | HR 84 | Temp 96.9°F | Resp 16 | Ht 63.0 in | Wt 195.0 lb

## 2019-12-02 DIAGNOSIS — E1169 Type 2 diabetes mellitus with other specified complication: Secondary | ICD-10-CM

## 2019-12-02 DIAGNOSIS — Z0001 Encounter for general adult medical examination with abnormal findings: Secondary | ICD-10-CM

## 2019-12-02 DIAGNOSIS — I10 Essential (primary) hypertension: Secondary | ICD-10-CM

## 2019-12-02 DIAGNOSIS — Z23 Encounter for immunization: Secondary | ICD-10-CM

## 2019-12-02 DIAGNOSIS — G729 Myopathy, unspecified: Secondary | ICD-10-CM

## 2019-12-02 NOTE — Progress Notes (Signed)
Provider: Marlowe Sax FNP-C   Airen Dales, [name], NP  Patient Care Team: Rayfield Beem, [name], NP as PCP - General (Family Medicine) Brien Few, MD as Consulting Physician (Obstetrics and Gynecology) Katy Apo, MD as Consulting Physician (Ophthalmology)  Extended Emergency Contact Information Primary Emergency Contact: Blair, Kevin of [place] Phone: [phone] Relation: Son  Code Status: Full Code  Goals of care: Advanced Directive information Advanced Directives 12/02/2019  Does Patient Have a Medical Advance Directive? Yes  Type of Paramedic of [place];Living will  Does patient want to make changes to medical advance directive? No - Patient declined  Copy of [place] in Chart? Yes - validated most recent copy scanned in chart (See row information)  Would patient like information on creating a medical advance directive? -     Chief Complaint  Patient presents with  . Annual Exam    Physical.  . Health Maintenance    Discuss the need for Foot Exam, and Eye Exam.  . Immunizations    Discuss the need for Influenza Vaccine.    HPI:  Pt is a 69 y.o. female seen today for medical management of chronic diseases. She denies any acute issues.states continues to see her Psychiatry.in the process of looking for a counselor who takes her insurance and can do video visit. She does water aerobics but sometimes does not feel like going when depressed but tries to attend classes.she is concerned since the time was changed to 8:30 am which is not her favorite time.she will start on silver sneakers or exercise also at home. Has seen Neurologist  She up to date her eye and foot exam.Has had her dental exam. No recent fall. Up to date with her immunization except due for influenza vaccine today.she agrees to get a high dose influenza vaccine today.  She recently had her mammogram and pap  smear at [facility] over OB/GYN.mammogram was normal.    Past Medical History:  Diagnosis Date  . Anxiety   . Bipolar 1 disorder ([place])   . Bulging of cervical intervertebral disc    C2  . Bulging of thoracic intervertebral disc    t4  . Depression   . Diabetes mellitus without complication ([place])   . Hyperlipidemia   . PTSD (post-traumatic stress disorder)   . Sad    Past Surgical History:  Procedure Laterality Date  . CATARACT EXTRACTION Right 08/11/2017  . CATARACT EXTRACTION Left 09/01/2017  . EYE SURGERY    . HEMICOLECTOMY  1997  . JOINT REPLACEMENT  2013   hip    Allergies  Allergen Reactions  . Oxycodone     Migraines  . Statins     "makes my urine turn brown"    Allergies as of 12/02/2019      Reactions   Oxycodone    Migraines   Statins    "makes my urine turn brown"      Medication List       Accurate as of December 02, 2019  1:56 PM. If you have any questions, ask your nurse or doctor.        Accu-Chek FastClix Lancets Misc USE TO TEST BLOOD SUGAR THREE TIMES DAILY   Accu-Chek Guide test strip Generic drug: glucose blood Use to test blood sugar three times daily. Dx: E11.9   ARIPiprazole ER 300 MG Prsy prefilled syringe Commonly known as: ABILIFY MAINTENA Inject 300 mg  into the muscle every 30 (thirty) days. Last injection 10/28/2019   ASTAXANTHIN PO Take 12 mg by mouth daily.   benztropine 0.5 MG tablet Commonly known as: COGENTIN Take 1 tablet by mouth 2 (two) times daily.   BERBERINE COMPLEX PO Take by mouth. 500 mg 3-4 times daily   clonazePAM 1 MG tablet Commonly known as: KLONOPIN 1 mg at night, 1/2 tablet during the day if needed   gabapentin 300 MG capsule Commonly known as: NEURONTIN Take one capsule by mouth three times daily   lamoTRIgine 200 MG tablet Commonly known as: LAMICTAL Take 200 mg by mouth 2 (two) times daily.   lisinopril 10 MG tablet Commonly known as: ZESTRIL TAKE 1 TABLET EVERY DAY   loratadine 10 MG  tablet Commonly known as: CLARITIN Take 1 tablet (10 mg total) by mouth daily.   metFORMIN 500 MG tablet Commonly known as: GLUCOPHAGE TAKE 1 TABLET TWICE DAILY WITH MEALS   sertraline 25 MG tablet Commonly known as: ZOLOFT Take 25 mg by mouth daily.   traZODone 50 MG tablet Commonly known as: DESYREL Take 50 mg by mouth at bedtime as needed.   vitamin C 1000 MG tablet Take 1,000 mg by mouth daily.       Review of Systems  Constitutional: Negative for appetite change, chills, fatigue and fever.  HENT: Negative for congestion, dental problem, hearing loss, postnasal drip, rhinorrhea, sinus pressure, sinus pain, sneezing, sore throat and trouble swallowing.   Eyes: Negative for discharge, redness, itching and visual disturbance.  Respiratory: Negative for cough, chest tightness, shortness of breath and wheezing.   Cardiovascular: Negative for chest pain, palpitations and leg swelling.  Gastrointestinal: Negative for abdominal distention, abdominal pain, constipation, diarrhea, nausea and vomiting.  Endocrine: Negative for cold intolerance, heat intolerance, polydipsia, polyphagia and polyuria.  Genitourinary: Negative for decreased urine volume, difficulty urinating, dysuria, flank pain, frequency and urgency.  Musculoskeletal: Negative for arthralgias, back pain, gait problem, joint swelling, myalgias and neck pain.  Skin: Negative for color change, pallor, rash and wound.  Neurological: Negative for dizziness, seizures, speech difficulty, weakness, light-headedness, numbness and headaches.  Hematological: Does not bruise/bleed easily.  Psychiatric/Behavioral: Negative for agitation, behavioral problems, confusion and sleep disturbance. The patient is not nervous/anxious.     Immunization History  Administered Date(s) Administered  . Fluad Quad(high Dose 65+) 12/21/2018  . Influenza,inj,Quad PF,6+ Mos 04/04/2016  . Influenza-Unspecified 12/29/2013, 12/14/2017  . PFIZER  SARS-COV-2 Vaccination 08/18/2019, 09/12/2019  . Pneumococcal Conjugate-13 01/04/2016  . Pneumococcal Polysaccharide-23 09/30/2017  . Tdap 09/29/2015  . Zoster Recombinat (Shingrix) 12/05/2017, 06/03/2018   Pertinent  Health Maintenance Due  Topic Date Due  . FOOT EXAM  01/06/2019  . OPHTHALMOLOGY EXAM  10/22/2019  . INFLUENZA VACCINE  10/30/2019  . HEMOGLOBIN A1C  04/25/2020  . MAMMOGRAM  11/06/2021  . COLONOSCOPY  10/18/2028  . DEXA SCAN  Completed  . PNA vac Low Risk Adult  Completed   Fall Risk  12/02/2019 11/11/2019 11/01/2019 10/18/2019 04/25/2019  Falls in the past year? 0 1 1 1  0  Number falls in past yr: 0 0 1 1 0  Comment - - - - -  Injury with Fall? 0 0 1 0 0  Comment - - - - -  Risk for fall due to : - - - - -      Vitals:   12/02/19 1331  BP: 132/82  Pulse: 84  Resp: 16  Temp: (!) 96.9 F (36.1 C)  SpO2: 98%  Weight:  195 lb (88.5 kg)  Height: 5\' 3"  (1.6 m)   Body mass index is 34.54 kg/m. Physical Exam Vitals reviewed.  Constitutional:      General: She is not in acute distress.    Appearance: She is obese. She is not ill-appearing.  HENT:     Head: Normocephalic.     Right Ear: Tympanic membrane, ear canal and external ear normal. There is no impacted cerumen.     Left Ear: Tympanic membrane, ear canal and external ear normal. There is no impacted cerumen.     Nose: Nose normal. No congestion or rhinorrhea.     Mouth/Throat:     Mouth: Mucous membranes are moist.     Pharynx: Oropharynx is clear. No oropharyngeal exudate or posterior oropharyngeal erythema.  Eyes:     General: No scleral icterus.       Right eye: No discharge.        Left eye: No discharge.     Extraocular Movements: Extraocular movements intact.     Conjunctiva/sclera: Conjunctivae normal.     Pupils: Pupils are equal, round, and reactive to light.  Neck:     Vascular: No carotid bruit.  Cardiovascular:     Rate and Rhythm: Normal rate and regular rhythm.     Pulses: Normal  pulses.     Heart sounds: Normal heart sounds. No murmur heard.  No friction rub. No gallop.      Comments: Bilateral lower extremities varicose veins,No calf tenderness on palpation  Pulmonary:     Effort: Pulmonary effort is normal. No respiratory distress.     Breath sounds: Normal breath sounds. No wheezing, rhonchi or rales.  Chest:     Chest wall: No tenderness.  Abdominal:     General: Bowel sounds are normal. There is no distension.     Palpations: Abdomen is soft. There is no mass.     Tenderness: There is no abdominal tenderness. There is no right CVA tenderness, left CVA tenderness, guarding or rebound.  Musculoskeletal:        General: No swelling or tenderness. Normal range of motion.     Cervical back: Normal range of motion. No rigidity or tenderness.     Right lower leg: No edema.     Left lower leg: Edema present.  Lymphadenopathy:     Cervical: No cervical adenopathy.  Skin:    General: Skin is warm and dry.     Coloration: Skin is not jaundiced or pale.     Findings: No bruising, erythema or rash.  Neurological:     Mental Status: She is alert and oriented to person, place, and time.     Cranial Nerves: No cranial nerve deficit.     Sensory: No sensory deficit.     Motor: No weakness.     Coordination: Coordination normal.     Gait: Gait normal.     Deep Tendon Reflexes: Reflexes normal.  Psychiatric:        Mood and Affect: Mood normal.        Behavior: Behavior normal.        Thought Content: Thought content normal.        Judgment: Judgment normal.     Labs reviewed: Recent Labs    04/21/19 0942 10/24/19 0931  NA 138 136  K 4.5 4.4  CL 102 102  CO2 26 19*  GLUCOSE 118* 133*  BUN 18 29*  CREATININE 0.90 1.01*  CALCIUM 9.3 9.3   Recent Labs  04/21/19 0942 10/24/19 0931  AST 14 15  ALT 14 15  BILITOT 0.3 0.5  PROT 6.3 6.8   Recent Labs    04/21/19 0942 10/24/19 0931  WBC 4.2 5.2  NEUTROABS 3,091 3,656  HGB 13.2 13.3  HCT 40.4  40.5  MCV 87.8 90.6  PLT 212 224   Lab Results  Component Value Date   TSH 2.10 10/24/2019   Lab Results  Component Value Date   HGBA1C 6.1 (H) 10/24/2019   Lab Results  Component Value Date   CHOL 273 (H) 10/24/2019   HDL 56 10/24/2019   LDLCALC 173 (H) 10/24/2019   TRIG 270 (H) 10/24/2019   CHOLHDL 4.9 10/24/2019    Significant Diagnostic Results in last 30 days:  No results found.  Assessment/Plan 1. Encounter for general adult medical examination with abnormal findings Up to date with immunization except for Flu vaccine administered today. Medication and labs reviewed patient counselled regarding yearly exam, prevention of dental and periodontal disease, diet, regular sustained exercise for at least 30 minutes x 3 /week,COVID-19 hand hygiene, mask and social distancing per CDC guidelines. Proper use of sun screen and protective clothing, recommended schedule for routine labs. Fall precaution. - Flu Vaccine QUAD High Dose(Fluad)  2. Essential hypertension B/p at goal. - continue on lisinopril  3. Type 2 diabetes mellitus with other specified complication, without long-term current use of insulin (HCC) Lab Results  Component Value Date   HGBA1C 6.1 (H) 10/24/2019  Controlled. Continue on metformin 500 mg tablet twice daily - on ACE inhibitor for renal protection  Not on statin due to allergies. - dietary and lifestyle modification advised   Family/ staff Communication: Reviewed plan of care with patient  Labs/tests ordered: Labs up to date.   Next Appointment : 1 year for Annual Physical examination   Sandrea Hughs, NP

## 2019-12-02 NOTE — Telephone Encounter (Signed)
Error

## 2019-12-02 NOTE — Patient Instructions (Signed)
Health Maintenance, Female Adopting a healthy lifestyle and getting preventive care are important in promoting health and wellness. Ask your health care provider about:  The right schedule for you to have regular tests and exams.  Things you can do on your own to prevent diseases and keep yourself healthy. What should I know about diet, weight, and exercise? Eat a healthy diet   Eat a diet that includes plenty of vegetables, fruits, low-fat dairy products, and lean protein.  Do not eat a lot of foods that are high in solid fats, added sugars, or sodium. Maintain a healthy weight Body mass index (BMI) is used to identify weight problems. It estimates body fat based on height and weight. Your health care provider can help determine your BMI and help you achieve or maintain a healthy weight. Get regular exercise Get regular exercise. This is one of the most important things you can do for your health. Most adults should:  Exercise for at least 150 minutes each week. The exercise should increase your heart rate and make you sweat (moderate-intensity exercise).  Do strengthening exercises at least twice a week. This is in addition to the moderate-intensity exercise.  Spend less time sitting. Even light physical activity can be beneficial. Watch cholesterol and blood lipids Have your blood tested for lipids and cholesterol at 70 years of age, then have this test every 5 years. Have your cholesterol levels checked more often if:  Your lipid or cholesterol levels are high.  You are older than 70 years of age.  You are at high risk for heart disease. What should I know about cancer screening? Depending on your health history and family history, you may need to have cancer screening at various ages. This may include screening for:  Breast cancer.  Cervical cancer.  Colorectal cancer.  Skin cancer.  Lung cancer. What should I know about heart disease, diabetes, and high blood  pressure? Blood pressure and heart disease  High blood pressure causes heart disease and increases the risk of stroke. This is more likely to develop in people who have high blood pressure readings, are of African descent, or are overweight.  Have your blood pressure checked: ? Every 3-5 years if you are 18-39 years of age. ? Every year if you are 40 years old or older. Diabetes Have regular diabetes screenings. This checks your fasting blood sugar level. Have the screening done:  Once every three years after age 40 if you are at a normal weight and have a low risk for diabetes.  More often and at a younger age if you are overweight or have a high risk for diabetes. What should I know about preventing infection? Hepatitis B If you have a higher risk for hepatitis B, you should be screened for this virus. Talk with your health care provider to find out if you are at risk for hepatitis B infection. Hepatitis C Testing is recommended for:  Everyone born from 1945 through 1965.  Anyone with known risk factors for hepatitis C. Sexually transmitted infections (STIs)  Get screened for STIs, including gonorrhea and chlamydia, if: ? You are sexually active and are younger than 70 years of age. ? You are older than 70 years of age and your health care provider tells you that you are at risk for this type of infection. ? Your sexual activity has changed since you were last screened, and you are at increased risk for chlamydia or gonorrhea. Ask your health care provider if   you are at risk.  Ask your health care provider about whether you are at high risk for HIV. Your health care provider may recommend a prescription medicine to help prevent HIV infection. If you choose to take medicine to prevent HIV, you should first get tested for HIV. You should then be tested every 3 months for as long as you are taking the medicine. Pregnancy  If you are about to stop having your period (premenopausal) and  you may become pregnant, seek counseling before you get pregnant.  Take 400 to 800 micrograms (mcg) of folic acid every day if you become pregnant.  Ask for birth control (contraception) if you want to prevent pregnancy. Osteoporosis and menopause Osteoporosis is a disease in which the bones lose minerals and strength with aging. This can result in bone fractures. If you are 65 years old or older, or if you are at risk for osteoporosis and fractures, ask your health care provider if you should:  Be screened for bone loss.  Take a calcium or vitamin D supplement to lower your risk of fractures.  Be given hormone replacement therapy (HRT) to treat symptoms of menopause. Follow these instructions at home: Lifestyle  Do not use any products that contain nicotine or tobacco, such as cigarettes, e-cigarettes, and chewing tobacco. If you need help quitting, ask your health care provider.  Do not use street drugs.  Do not share needles.  Ask your health care provider for help if you need support or information about quitting drugs. Alcohol use  Do not drink alcohol if: ? Your health care provider tells you not to drink. ? You are pregnant, may be pregnant, or are planning to become pregnant.  If you drink alcohol: ? Limit how much you use to 0-1 drink a day. ? Limit intake if you are breastfeeding.  Be aware of how much alcohol is in your drink. In the [place] one drink equals one 12 oz bottle of beer (355 mL), one 5 oz glass of wine (148 mL), or one 1 oz glass of hard liquor (44 mL). General instructions  Schedule regular health, dental, and eye exams.  Stay current with your vaccines.  Tell your health care provider if: ? You often feel depressed. ? You have ever been abused or do not feel safe at home. Summary  Adopting a healthy lifestyle and getting preventive care are important in promoting health and wellness.  Follow your health care provider's instructions about healthy  diet, exercising, and getting tested or screened for diseases.  Follow your health care provider's instructions on monitoring your cholesterol and blood pressure. This information is not intended to replace advice given to you by your health care provider. Make sure you discuss any questions you have with your health care provider. Document Revised: 03/10/2018 Document Reviewed: 03/10/2018 Elsevier Patient Education  2020 [employer]  

## 2019-12-08 ENCOUNTER — Other Ambulatory Visit: Payer: Self-pay

## 2019-12-08 ENCOUNTER — Encounter (HOSPITAL_COMMUNITY): Payer: Self-pay

## 2019-12-08 ENCOUNTER — Ambulatory Visit (HOSPITAL_COMMUNITY)
Admission: EM | Admit: 2019-12-08 | Discharge: 2019-12-08 | Disposition: A | Discharge status: Home / Self Care | Payer: Self-pay | Attending: Nurse Practitioner | Admitting: Nurse Practitioner

## 2019-12-08 ENCOUNTER — Ambulatory Visit (INDEPENDENT_AMBULATORY_CARE_PROVIDER_SITE_OTHER): Payer: Self-pay

## 2019-12-08 DIAGNOSIS — S62325A Displaced fracture of shaft of fourth metacarpal bone, left hand, initial encounter for closed fracture: Secondary | ICD-10-CM | POA: Diagnosis not present

## 2019-12-08 DIAGNOSIS — M79642 Pain in left hand: Secondary | ICD-10-CM

## 2019-12-08 DIAGNOSIS — S161XXA Strain of muscle, fascia and tendon at neck level, initial encounter: Secondary | ICD-10-CM

## 2019-12-08 DIAGNOSIS — R600 Localized edema: Secondary | ICD-10-CM | POA: Diagnosis not present

## 2019-12-08 DIAGNOSIS — S6992XA Unspecified injury of left wrist, hand and finger(s), initial encounter: Secondary | ICD-10-CM

## 2019-12-08 MED ORDER — NAPROXEN 500 MG PO TABS
500.0000 mg | ORAL_TABLET | Freq: Two times a day (BID) | ORAL | 0 refills | Status: DC | PRN
Start: 2019-12-08 — End: 2020-07-23

## 2019-12-08 MED ORDER — METHOCARBAMOL 500 MG PO TABS: 500.0000 mg | ORAL_TABLET | Freq: Three times a day (TID) | ORAL | 0 refills | Status: DC | PRN

## 2019-12-08 NOTE — Discharge Instructions (Addendum)
Take medications as prescribed for pain and muscle aches. Alternate between ice and heat to affected areas. Wear splint at all times except showering/bathing. Follow-up with orthopedics. Call ASAP to make an appointment.
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
## 2019-12-16 DIAGNOSIS — S62325A Displaced fracture of shaft of fourth metacarpal bone, left hand, initial encounter for closed fracture: Secondary | ICD-10-CM | POA: Diagnosis not present

## 2019-12-16 DIAGNOSIS — R2232 Localized swelling, mass and lump, left upper limb: Secondary | ICD-10-CM | POA: Diagnosis not present

## 2019-12-21 DIAGNOSIS — M79645 Pain in left finger(s): Secondary | ICD-10-CM | POA: Diagnosis not present

## 2019-12-21 DIAGNOSIS — S62325A Displaced fracture of shaft of fourth metacarpal bone, left hand, initial encounter for closed fracture: Secondary | ICD-10-CM | POA: Diagnosis not present

## 2019-12-21 DIAGNOSIS — R2232 Localized swelling, mass and lump, left upper limb: Secondary | ICD-10-CM | POA: Diagnosis not present

## 2019-12-21 DIAGNOSIS — M25642 Stiffness of left hand, not elsewhere classified: Secondary | ICD-10-CM | POA: Diagnosis not present

## 2019-12-28 DIAGNOSIS — L603 Nail dystrophy: Secondary | ICD-10-CM | POA: Diagnosis not present

## 2019-12-28 DIAGNOSIS — L84 Corns and callosities: Secondary | ICD-10-CM | POA: Diagnosis not present

## 2019-12-28 DIAGNOSIS — L602 Onychogryphosis: Secondary | ICD-10-CM | POA: Diagnosis not present

## 2019-12-28 DIAGNOSIS — E1351 Other specified diabetes mellitus with diabetic peripheral angiopathy without gangrene: Secondary | ICD-10-CM | POA: Diagnosis not present

## 2019-12-30 DIAGNOSIS — I10 Essential (primary) hypertension: Secondary | ICD-10-CM | POA: Diagnosis not present

## 2019-12-30 DIAGNOSIS — F310 Bipolar disorder, current episode hypomanic: Secondary | ICD-10-CM | POA: Diagnosis not present

## 2019-12-30 DIAGNOSIS — M5021 Other cervical disc displacement,  high cervical region: Secondary | ICD-10-CM | POA: Diagnosis not present

## 2019-12-30 DIAGNOSIS — M5124 Other intervertebral disc displacement, thoracic region: Secondary | ICD-10-CM | POA: Diagnosis not present

## 2019-12-30 DIAGNOSIS — M25561 Pain in right knee: Secondary | ICD-10-CM | POA: Diagnosis not present

## 2019-12-30 DIAGNOSIS — G8929 Other chronic pain: Secondary | ICD-10-CM | POA: Diagnosis not present

## 2019-12-30 DIAGNOSIS — F4310 Post-traumatic stress disorder, unspecified: Secondary | ICD-10-CM | POA: Diagnosis not present

## 2019-12-30 DIAGNOSIS — F419 Anxiety disorder, unspecified: Secondary | ICD-10-CM | POA: Diagnosis not present

## 2019-12-30 DIAGNOSIS — E119 Type 2 diabetes mellitus without complications: Secondary | ICD-10-CM | POA: Diagnosis not present

## 2020-01-04 DIAGNOSIS — L603 Nail dystrophy: Secondary | ICD-10-CM | POA: Diagnosis not present

## 2020-01-04 DIAGNOSIS — S62325D Displaced fracture of shaft of fourth metacarpal bone, left hand, subsequent encounter for fracture with routine healing: Secondary | ICD-10-CM | POA: Diagnosis not present

## 2020-01-04 DIAGNOSIS — S62325A Displaced fracture of shaft of fourth metacarpal bone, left hand, initial encounter for closed fracture: Secondary | ICD-10-CM | POA: Diagnosis not present

## 2020-01-05 DIAGNOSIS — G8929 Other chronic pain: Secondary | ICD-10-CM | POA: Diagnosis not present

## 2020-01-05 DIAGNOSIS — E119 Type 2 diabetes mellitus without complications: Secondary | ICD-10-CM | POA: Diagnosis not present

## 2020-01-05 DIAGNOSIS — M25561 Pain in right knee: Secondary | ICD-10-CM | POA: Diagnosis not present

## 2020-01-05 DIAGNOSIS — F310 Bipolar disorder, current episode hypomanic: Secondary | ICD-10-CM | POA: Diagnosis not present

## 2020-01-05 DIAGNOSIS — I10 Essential (primary) hypertension: Secondary | ICD-10-CM | POA: Diagnosis not present

## 2020-01-05 DIAGNOSIS — M5124 Other intervertebral disc displacement, thoracic region: Secondary | ICD-10-CM | POA: Diagnosis not present

## 2020-01-05 DIAGNOSIS — F419 Anxiety disorder, unspecified: Secondary | ICD-10-CM | POA: Diagnosis not present

## 2020-01-05 DIAGNOSIS — F4310 Post-traumatic stress disorder, unspecified: Secondary | ICD-10-CM | POA: Diagnosis not present

## 2020-01-05 DIAGNOSIS — M5021 Other cervical disc displacement,  high cervical region: Secondary | ICD-10-CM | POA: Diagnosis not present

## 2020-01-10 DIAGNOSIS — F419 Anxiety disorder, unspecified: Secondary | ICD-10-CM | POA: Diagnosis not present

## 2020-01-10 DIAGNOSIS — M5021 Other cervical disc displacement,  high cervical region: Secondary | ICD-10-CM | POA: Diagnosis not present

## 2020-01-10 DIAGNOSIS — I10 Essential (primary) hypertension: Secondary | ICD-10-CM | POA: Diagnosis not present

## 2020-01-10 DIAGNOSIS — E119 Type 2 diabetes mellitus without complications: Secondary | ICD-10-CM | POA: Diagnosis not present

## 2020-01-10 DIAGNOSIS — F310 Bipolar disorder, current episode hypomanic: Secondary | ICD-10-CM | POA: Diagnosis not present

## 2020-01-10 DIAGNOSIS — G8929 Other chronic pain: Secondary | ICD-10-CM | POA: Diagnosis not present

## 2020-01-10 DIAGNOSIS — M5124 Other intervertebral disc displacement, thoracic region: Secondary | ICD-10-CM | POA: Diagnosis not present

## 2020-01-10 DIAGNOSIS — F4310 Post-traumatic stress disorder, unspecified: Secondary | ICD-10-CM | POA: Diagnosis not present

## 2020-01-24 DIAGNOSIS — M5124 Other intervertebral disc displacement, thoracic region: Secondary | ICD-10-CM | POA: Diagnosis not present

## 2020-01-24 DIAGNOSIS — F419 Anxiety disorder, unspecified: Secondary | ICD-10-CM | POA: Diagnosis not present

## 2020-01-24 DIAGNOSIS — F4310 Post-traumatic stress disorder, unspecified: Secondary | ICD-10-CM | POA: Diagnosis not present

## 2020-01-24 DIAGNOSIS — G8929 Other chronic pain: Secondary | ICD-10-CM | POA: Diagnosis not present

## 2020-01-24 DIAGNOSIS — M5021 Other cervical disc displacement,  high cervical region: Secondary | ICD-10-CM | POA: Diagnosis not present

## 2020-01-24 DIAGNOSIS — I10 Essential (primary) hypertension: Secondary | ICD-10-CM | POA: Diagnosis not present

## 2020-01-24 DIAGNOSIS — G4700 Insomnia, unspecified: Secondary | ICD-10-CM | POA: Diagnosis not present

## 2020-01-24 DIAGNOSIS — E119 Type 2 diabetes mellitus without complications: Secondary | ICD-10-CM | POA: Diagnosis not present

## 2020-01-24 DIAGNOSIS — F310 Bipolar disorder, current episode hypomanic: Secondary | ICD-10-CM | POA: Diagnosis not present

## 2020-01-25 DIAGNOSIS — S62325A Displaced fracture of shaft of fourth metacarpal bone, left hand, initial encounter for closed fracture: Secondary | ICD-10-CM | POA: Diagnosis not present

## 2020-01-25 DIAGNOSIS — S62325D Displaced fracture of shaft of fourth metacarpal bone, left hand, subsequent encounter for fracture with routine healing: Secondary | ICD-10-CM | POA: Diagnosis not present

## 2020-02-01 DIAGNOSIS — F3181 Bipolar II disorder: Secondary | ICD-10-CM | POA: Diagnosis not present

## 2020-02-10 ENCOUNTER — Other Ambulatory Visit: Payer: Self-pay | Admitting: *Deleted

## 2020-02-10 DIAGNOSIS — G894 Chronic pain syndrome: Secondary | ICD-10-CM

## 2020-02-10 MED ORDER — GABAPENTIN 300 MG PO CAPS: ORAL_CAPSULE | ORAL | 1 refills | Status: DC

## 2020-02-10 MED ORDER — LORATADINE 10 MG PO TABS
10.0000 mg | ORAL_TABLET | Freq: Every day | ORAL | 1 refills | Status: DC
Start: 2020-02-10 — End: 2020-05-28

## 2020-02-10 NOTE — Telephone Encounter (Signed)
Patient requested refill Faxed to pharmacy.  

## 2020-02-22 DIAGNOSIS — F4310 Post-traumatic stress disorder, unspecified: Secondary | ICD-10-CM | POA: Diagnosis not present

## 2020-02-22 DIAGNOSIS — M5124 Other intervertebral disc displacement, thoracic region: Secondary | ICD-10-CM | POA: Diagnosis not present

## 2020-02-22 DIAGNOSIS — F419 Anxiety disorder, unspecified: Secondary | ICD-10-CM | POA: Diagnosis not present

## 2020-02-22 DIAGNOSIS — G8929 Other chronic pain: Secondary | ICD-10-CM | POA: Diagnosis not present

## 2020-02-22 DIAGNOSIS — F310 Bipolar disorder, current episode hypomanic: Secondary | ICD-10-CM | POA: Diagnosis not present

## 2020-02-22 DIAGNOSIS — E119 Type 2 diabetes mellitus without complications: Secondary | ICD-10-CM | POA: Diagnosis not present

## 2020-02-22 DIAGNOSIS — I10 Essential (primary) hypertension: Secondary | ICD-10-CM | POA: Diagnosis not present

## 2020-02-22 DIAGNOSIS — G4700 Insomnia, unspecified: Secondary | ICD-10-CM | POA: Diagnosis not present

## 2020-02-22 DIAGNOSIS — M5021 Other cervical disc displacement,  high cervical region: Secondary | ICD-10-CM | POA: Diagnosis not present

## 2020-02-29 DIAGNOSIS — S62325D Displaced fracture of shaft of fourth metacarpal bone, left hand, subsequent encounter for fracture with routine healing: Secondary | ICD-10-CM | POA: Diagnosis not present

## 2020-02-29 DIAGNOSIS — R2232 Localized swelling, mass and lump, left upper limb: Secondary | ICD-10-CM | POA: Diagnosis not present

## 2020-03-01 DIAGNOSIS — L602 Onychogryphosis: Secondary | ICD-10-CM | POA: Diagnosis not present

## 2020-03-01 DIAGNOSIS — E1351 Other specified diabetes mellitus with diabetic peripheral angiopathy without gangrene: Secondary | ICD-10-CM | POA: Diagnosis not present

## 2020-03-06 DIAGNOSIS — G729 Myopathy, unspecified: Secondary | ICD-10-CM | POA: Insufficient documentation

## 2020-03-07 DIAGNOSIS — M5021 Other cervical disc displacement,  high cervical region: Secondary | ICD-10-CM | POA: Diagnosis not present

## 2020-03-07 DIAGNOSIS — G4700 Insomnia, unspecified: Secondary | ICD-10-CM | POA: Diagnosis not present

## 2020-03-07 DIAGNOSIS — G8929 Other chronic pain: Secondary | ICD-10-CM | POA: Diagnosis not present

## 2020-03-07 DIAGNOSIS — F4310 Post-traumatic stress disorder, unspecified: Secondary | ICD-10-CM | POA: Diagnosis not present

## 2020-03-07 DIAGNOSIS — F419 Anxiety disorder, unspecified: Secondary | ICD-10-CM | POA: Diagnosis not present

## 2020-03-07 DIAGNOSIS — F310 Bipolar disorder, current episode hypomanic: Secondary | ICD-10-CM | POA: Diagnosis not present

## 2020-03-07 DIAGNOSIS — I10 Essential (primary) hypertension: Secondary | ICD-10-CM | POA: Diagnosis not present

## 2020-03-07 DIAGNOSIS — E119 Type 2 diabetes mellitus without complications: Secondary | ICD-10-CM | POA: Diagnosis not present

## 2020-03-07 DIAGNOSIS — M5124 Other intervertebral disc displacement, thoracic region: Secondary | ICD-10-CM | POA: Diagnosis not present

## 2020-03-10 DIAGNOSIS — E119 Type 2 diabetes mellitus without complications: Secondary | ICD-10-CM

## 2020-03-10 DIAGNOSIS — G8929 Other chronic pain: Secondary | ICD-10-CM

## 2020-03-10 DIAGNOSIS — M5124 Other intervertebral disc displacement, thoracic region: Secondary | ICD-10-CM

## 2020-03-10 DIAGNOSIS — M5021 Other cervical disc displacement,  high cervical region: Secondary | ICD-10-CM

## 2020-03-10 DIAGNOSIS — F310 Bipolar disorder, current episode hypomanic: Secondary | ICD-10-CM | POA: Diagnosis not present

## 2020-03-10 DIAGNOSIS — I10 Essential (primary) hypertension: Secondary | ICD-10-CM | POA: Diagnosis not present

## 2020-03-10 DIAGNOSIS — F4310 Post-traumatic stress disorder, unspecified: Secondary | ICD-10-CM | POA: Diagnosis not present

## 2020-03-10 DIAGNOSIS — F419 Anxiety disorder, unspecified: Secondary | ICD-10-CM | POA: Diagnosis not present

## 2020-03-19 ENCOUNTER — Encounter: Payer: Self-pay | Admitting: Family

## 2020-03-19 ENCOUNTER — Telehealth (INDEPENDENT_AMBULATORY_CARE_PROVIDER_SITE_OTHER): Payer: Medicare HMO | Admitting: Family

## 2020-03-19 ENCOUNTER — Other Ambulatory Visit: Payer: Self-pay

## 2020-03-19 ENCOUNTER — Telehealth: Payer: Self-pay | Admitting: *Deleted

## 2020-03-19 ENCOUNTER — Telehealth: Payer: Self-pay

## 2020-03-19 DIAGNOSIS — U071 COVID-19: Secondary | ICD-10-CM | POA: Diagnosis not present

## 2020-03-19 DIAGNOSIS — J069 Acute upper respiratory infection, unspecified: Secondary | ICD-10-CM

## 2020-03-19 MED ORDER — VITAMIN D 125 MCG (5000 UT) PO CAPS: 1.0000 | ORAL_CAPSULE | Freq: Every day | ORAL | 0 refills | Status: AC

## 2020-03-19 MED ORDER — ZINC GLUCONATE 50 MG PO TABS: 50.0000 mg | ORAL_TABLET | Freq: Every day | ORAL | 0 refills | Status: AC

## 2020-03-19 NOTE — Telephone Encounter (Signed)
Patient called and stated that she had a good visit with you today but she stated that you never told her what to do about the infusion.  Stated that it is getting harder to breath and her finger tips are no longer pink, not blue yet, but not pink either. Stated that her throat has started hurting.  Wants you to call her about getting the infusion and where.  Please Advise.

## 2020-03-19 NOTE — Telephone Encounter (Signed)
If fingers are turning blue and getting hard to breath.I recommend being evaluated in the ED as soon as possible.

## 2020-03-19 NOTE — Progress Notes (Signed)
This service is provided via telemedicine  No vital signs collected/recorded due to the encounter was a telemedicine visit.   Location of patient (ex: home, work): Home.  Patient consents to a telephone visit: Yes.  Location of the provider (ex: office, home):  [facility].  Name of any referring provider: Chrysten Woulfe, [name], NP   Names of all persons participating in the telemedicine service and their role in the encounter: Patient, Heriberto Ant[place], [place], [place], Webb Silversmith, NP.    Time spent on call: 8 minutes spent on the phone with Medical Assistant.     Provider: Marlowe Sax FNP-C  Fardowsa Authier, [name], NP  Patient Care Team: Ramandeep Arington, [name], NP as PCP - General (Family Medicine) Brien Few, MD as Consulting Physician (Obstetrics and Gynecology) Katy Apo, MD as Consulting Physician (Ophthalmology)  Extended Emergency Contact Information Primary Emergency Contact: [place], [place] of [place] Phone: [phone] Relation: Son  Code Status:  Full Code  Goals of care: Advanced Directive information Advanced Directives 03/19/2020  Does Patient Have a Medical Advance Directive? Yes  Type of Paramedic of [place];Living will  Does patient want to make changes to medical advance directive? No - Patient declined  Copy of [place] in Chart? Yes - validated most recent copy scanned in chart (See row information)  Would patient like information on creating a medical advance directive? -     Chief Complaint  Patient presents with  . Acute Visit    Complains of headaches, congestion, and low grade fever. Positive for Covid-19 x 4 days.    HPI:  Pt is a 69 y.o. female seen today for an acute visit for evaluation of headache,congestion and low grade fever x 4 days.She took care of her Neighbor who had COVID-19.Had a COVID-19 home kit tested positive last night 03/18/2020.  Has had body aches,coughed brown with small blood.Appetite has been good.Took imodium on Thursday due to diarrhea.No diarrhea since then.  she denies any loss of taste,smell chest tightness,chest pain,wheezing or shortness of breath.  Her Temp during visit 99.8.Has not taken any tylenol or ibuprofen.States does not take tylenol due to her liver problems.Has Ibuprofen.    Past Medical History:  Diagnosis Date  . Anxiety   . Bipolar 1 disorder ([place])   . Bulging of cervical intervertebral disc    C2  . Bulging of thoracic intervertebral disc    t4  . Depression   . Diabetes mellitus without complication ([place])   . Hyperlipidemia   . PTSD (post-traumatic stress disorder)   . Sad    Past Surgical History:  Procedure Laterality Date  . CATARACT EXTRACTION Right 08/11/2017  . CATARACT EXTRACTION Left 09/01/2017  . EYE SURGERY    . HEMICOLECTOMY  1997  . JOINT REPLACEMENT  2013   hip    Allergies  Allergen Reactions  . Oxycodone     Migraines  . Statins Other (See Comments)    "makes my urine turn brown" Other reaction(s): MUSCLE PAIN "makes my urine turn brown"    Outpatient Encounter Medications as of 03/19/2020  Medication Sig  . Accu-Chek FastClix Lancets MISC USE TO TEST BLOOD SUGAR THREE TIMES DAILY  . ARIPiprazole ER (ABILIFY MAINTENA) 300 MG PRSY prefilled syringe Inject 300 mg into the muscle every 30 (thirty) days. Last injection 10/28/2019  . Ascorbic Acid (VITAMIN C) 1000 MG tablet Take 1,000 mg by  mouth daily.  . ASTAXANTHIN PO Take 12 mg by mouth daily.   Jolyne Loa Grape-Goldenseal (BERBERINE COMPLEX PO) Take by mouth. 500 mg 3-4 times daily  . clonazePAM (KLONOPIN) 1 MG tablet 1 mg at night, 1/2 tablet during the day if needed  . gabapentin (NEURONTIN) 300 MG capsule Take one capsule by mouth three times daily  . glucose blood (ACCU-CHEK GUIDE) test strip Use to test blood sugar three times daily. Dx: E11.9  . lamoTRIgine (LAMICTAL) 200 MG tablet Take 200  mg by mouth 2 (two) times daily.   Marland Kitchen lisinopril (ZESTRIL) 10 MG tablet TAKE 1 TABLET EVERY DAY  . loratadine (CLARITIN) 10 MG tablet Take 1 tablet (10 mg total) by mouth daily.  . metFORMIN (GLUCOPHAGE) 500 MG tablet TAKE 1 TABLET TWICE DAILY WITH MEALS  . methocarbamol (ROBAXIN) 500 MG tablet Take 1 tablet (500 mg total) by mouth every 8 (eight) hours as needed for muscle spasms.  . naproxen (NAPROSYN) 500 MG tablet Take 1 tablet (500 mg total) by mouth 2 (two) times daily as needed (PAIN). Take with food  . sertraline (ZOLOFT) 25 MG tablet Take 25 mg by mouth daily.  . traZODone (DESYREL) 50 MG tablet Take 25 mg by mouth at bedtime as needed.  . [DISCONTINUED] benztropine (COGENTIN) 0.5 MG tablet Take 1 tablet by mouth 2 (two) times daily.   No facility-administered encounter medications on file as of 03/19/2020.    Review of Systems  Constitutional: Negative for appetite change, chills, fatigue and fever.       Temp runs 98.8 today temp 99.8   HENT: Positive for congestion and rhinorrhea. Negative for ear pain, hearing loss, postnasal drip, sinus pressure, sinus pain, sneezing, sore throat and trouble swallowing.        Generalized body aches   Eyes: Negative for discharge, redness and itching.  Respiratory: Positive for cough. Negative for chest tightness, shortness of breath and wheezing.   Cardiovascular: Negative for chest pain, palpitations and leg swelling.  Gastrointestinal: Negative for abdominal distention, abdominal pain, constipation, diarrhea, nausea and vomiting.  Genitourinary: Negative for difficulty urinating, dysuria, frequency, hematuria and urgency.  Skin: Negative for color change, pallor and rash.  Neurological: Positive for headaches. Negative for speech difficulty and light-headedness.  Hematological: Does not bruise/bleed easily.  Psychiatric/Behavioral: Negative for agitation, confusion and sleep disturbance. The patient is not nervous/anxious.      Immunization History  Administered Date(s) Administered  . Fluad Quad(high Dose 65+) 12/21/2018, 12/02/2019  . Influenza,inj,Quad PF,6+ Mos 04/04/2016  . Influenza-Unspecified 12/29/2013, 12/14/2017  . PFIZER SARS-COV-2 Vaccination 08/18/2019, 09/12/2019  . Pneumococcal Conjugate-13 01/04/2016  . Pneumococcal Polysaccharide-23 09/30/2017  . Tdap 09/29/2015  . Zoster Recombinat (Shingrix) 12/05/2017, 06/03/2018   Pertinent  Health Maintenance Due  Topic Date Due  . FOOT EXAM  01/06/2019  . OPHTHALMOLOGY EXAM  10/22/2019  . HEMOGLOBIN A1C  04/25/2020  . MAMMOGRAM  11/06/2021  . COLONOSCOPY  10/18/2028  . INFLUENZA VACCINE  Completed  . DEXA SCAN  Completed  . PNA vac Low Risk Adult  Completed   Fall Risk  03/19/2020 12/02/2019 11/11/2019 11/01/2019 10/18/2019  Falls in the past year? 0 0 '1 1 1  ' Number falls in past yr: 0 0 0 1 1  Comment - - - - -  Injury with Fall? 0 0 0 1 0  Comment - - - - -  Risk for fall due to : - - - - -   Functional Status Survey:  There were no vitals filed for this visit. There is no height or weight on file to calculate BMI. Physical Exam Constitutional:      General: She is not in acute distress.    Appearance: She is not ill-appearing.  Pulmonary:     Effort: Pulmonary effort is normal. No respiratory distress.  Neurological:     Mental Status: She is alert and oriented to person, place, and time.  Psychiatric:        Mood and Affect: Mood normal.        Speech: Speech normal.        Behavior: Behavior normal.        Thought Content: Thought content normal.    Labs reviewed: Recent Labs    04/21/19 0942 10/24/19 0931  NA 138 136  K 4.5 4.4  CL 102 102  CO2 26 19*  GLUCOSE 118* 133*  BUN 18 29*  CREATININE 0.90 1.01*  CALCIUM 9.3 9.3   Recent Labs    04/21/19 0942 10/24/19 0931  AST 14 15  ALT 14 15  BILITOT 0.3 0.5  PROT 6.3 6.8   Recent Labs    04/21/19 0942 10/24/19 0931  WBC 4.2 5.2  NEUTROABS 3,091 3,656   HGB 13.2 13.3  HCT 40.4 40.5  MCV 87.8 90.6  PLT 212 224   Lab Results  Component Value Date   TSH 2.10 10/24/2019   Lab Results  Component Value Date   HGBA1C 6.1 (H) 10/24/2019   Lab Results  Component Value Date   CHOL 273 (H) 10/24/2019   HDL 56 10/24/2019   LDLCALC 173 (H) 10/24/2019   TRIG 270 (H) 10/24/2019   CHOLHDL 4.9 10/24/2019    Significant Diagnostic Results in last 30 days:  No results found.  Assessment/Plan   Upper respiratory tract infection due to COVID-19 virus Reports Temp 99.8 during visit. - Advised to increase fluid intake and soups. - Zinc 50 mg tablet one daily x 14 days. - Continue on Vitamin C supplement  - Vitamin D 5000 units daily x 14 days. - Would recommend MAB infusion if symptoms worsen  - Advised to notify provider or go to ED if symptoms worsen or having any wheezing,chest tightness,chest pain wheezing or shortness of breath.   - Cholecalciferol (VITAMIN D) 125 MCG (5000 UT) CAPS; Take 1 capsule by mouth daily for 14 days.  Dispense: 14 capsule; Refill: 0 - zinc gluconate 50 MG tablet; Take 1 tablet (50 mg total) by mouth daily for 14 days.  Dispense: 14 tablet; Refill: 0  Family/ staff Communication: Reviewed plan of care with patient  Labs/tests ordered: None   Next Appointment: As needed if symptoms worsen or fail to improve.   I connected with  Jacqualine Code on 03/19/20 by a video enabled telemedicine application and verified that I am speaking with the correct person using two identifiers.   I discussed the limitations of evaluation and management by telemedicine. The patient expressed understanding and agreed to proceed.  Spent 20 minutes of face to face with patient    Sandrea Hughs, NP

## 2020-03-19 NOTE — Telephone Encounter (Signed)
[name], emile are scheduled for a virtual visit with your provider today.    Just as we do with appointments in the office, we must obtain your consent to participate.  Your consent will be active for this visit and any virtual visit you may have with one of our providers in the next 365 days.    If you have a MyChart account, I can also send a copy of this consent to you electronically.  All virtual visits are billed to your insurance company just like a traditional visit in the office.  As this is a virtual visit, video technology does not allow for your provider to perform a traditional examination.  This may limit your provider's ability to fully assess your condition.  If your provider identifies any concerns that need to be evaluated in person or the need to arrange testing such as labs, EKG, etc, we will make arrangements to do so.    Although advances in technology are sophisticated, we cannot ensure that it will always work on either your end or our end.  If the connection with a video visit is poor, we may have to switch to a telephone visit.  With either a video or telephone visit, we are not always able to ensure that we have a secure connection.   I need to obtain your verbal consent now.   Are you willing to proceed with your visit today?   Kaitlyn Wood has provided verbal consent on 03/19/2020 for a virtual visit (video or telephone).   Kaitlyn Wood, [place] 03/19/2020  10:00 AM

## 2020-03-19 NOTE — Telephone Encounter (Signed)
Patient notified and agreed. Stated she will go ahead and go.

## 2020-03-20 ENCOUNTER — Telehealth: Payer: Self-pay

## 2020-03-20 ENCOUNTER — Telehealth: Payer: Self-pay | Admitting: Internal Medicine

## 2020-03-20 NOTE — Telephone Encounter (Signed)
The patient states she did a home COVID test Sunday and it was positive. She was seen for a televisit yesterday. She went to the ER as instructed and the ER was full. Triage told her that if she was able to have a conversation then they would not admit her and that she could not get an infusion unless she had a referral. She is requesting a referral for the infusion. She states she feels better today, but that sx wax and wane.

## 2020-03-20 NOTE — Telephone Encounter (Signed)
Noted  

## 2020-03-20 NOTE — Telephone Encounter (Signed)
Called to Discuss with patient about Covid symptoms and the use of the monoclonal antibody infusion for those with mild to moderate Covid symptoms and at a high risk of hospitalization.     Pt received MAB infusion at outside facility earlier today.   Alan Ripper, [place]

## 2020-03-20 NOTE — Telephone Encounter (Signed)
Patient called and stated that you can disregard message because she found someone to do the infusion without a referral.   Stated that she went to the ER yesterday and there were 90 people ahead of her and the nurse told her that if she could carry on a conversation that they were going to send her home anyway so she left.

## 2020-03-28 ENCOUNTER — Other Ambulatory Visit: Payer: Self-pay | Admitting: Family

## 2020-03-28 DIAGNOSIS — M5021 Other cervical disc displacement,  high cervical region: Secondary | ICD-10-CM | POA: Diagnosis not present

## 2020-03-28 DIAGNOSIS — I10 Essential (primary) hypertension: Secondary | ICD-10-CM

## 2020-03-28 DIAGNOSIS — G8929 Other chronic pain: Secondary | ICD-10-CM | POA: Diagnosis not present

## 2020-03-28 DIAGNOSIS — F310 Bipolar disorder, current episode hypomanic: Secondary | ICD-10-CM | POA: Diagnosis not present

## 2020-03-28 DIAGNOSIS — G4700 Insomnia, unspecified: Secondary | ICD-10-CM | POA: Diagnosis not present

## 2020-03-28 DIAGNOSIS — E1169 Type 2 diabetes mellitus with other specified complication: Secondary | ICD-10-CM

## 2020-03-28 DIAGNOSIS — M5124 Other intervertebral disc displacement, thoracic region: Secondary | ICD-10-CM | POA: Diagnosis not present

## 2020-03-28 DIAGNOSIS — E119 Type 2 diabetes mellitus without complications: Secondary | ICD-10-CM | POA: Diagnosis not present

## 2020-03-28 DIAGNOSIS — F419 Anxiety disorder, unspecified: Secondary | ICD-10-CM | POA: Diagnosis not present

## 2020-03-28 DIAGNOSIS — F4310 Post-traumatic stress disorder, unspecified: Secondary | ICD-10-CM | POA: Diagnosis not present

## 2020-04-03 ENCOUNTER — Other Ambulatory Visit: Payer: Medicare HMO

## 2020-04-03 DIAGNOSIS — Z20822 Contact with and (suspected) exposure to covid-19: Secondary | ICD-10-CM | POA: Diagnosis not present

## 2020-04-03 DIAGNOSIS — F3181 Bipolar II disorder: Secondary | ICD-10-CM | POA: Diagnosis not present

## 2020-04-04 ENCOUNTER — Encounter: Payer: Self-pay | Admitting: *Deleted

## 2020-04-04 DIAGNOSIS — E119 Type 2 diabetes mellitus without complications: Secondary | ICD-10-CM | POA: Diagnosis not present

## 2020-04-04 DIAGNOSIS — H52203 Unspecified astigmatism, bilateral: Secondary | ICD-10-CM | POA: Diagnosis not present

## 2020-04-04 DIAGNOSIS — Z961 Presence of intraocular lens: Secondary | ICD-10-CM | POA: Diagnosis not present

## 2020-04-04 DIAGNOSIS — H31002 Unspecified chorioretinal scars, left eye: Secondary | ICD-10-CM | POA: Diagnosis not present

## 2020-04-04 LAB — HM DIABETES EYE EXAM

## 2020-04-04 LAB — SARS-COV-2, NAA 2 DAY TAT

## 2020-04-04 LAB — NOVEL CORONAVIRUS, NAA: SARS-CoV-2, NAA: NOT DETECTED

## 2020-04-11 DIAGNOSIS — S62325D Displaced fracture of shaft of fourth metacarpal bone, left hand, subsequent encounter for fracture with routine healing: Secondary | ICD-10-CM | POA: Diagnosis not present

## 2020-04-18 DIAGNOSIS — F3181 Bipolar II disorder: Secondary | ICD-10-CM | POA: Diagnosis not present

## 2020-05-02 ENCOUNTER — Other Ambulatory Visit: Payer: Medicare HMO

## 2020-05-02 DIAGNOSIS — F419 Anxiety disorder, unspecified: Secondary | ICD-10-CM | POA: Diagnosis not present

## 2020-05-02 DIAGNOSIS — I10 Essential (primary) hypertension: Secondary | ICD-10-CM | POA: Diagnosis not present

## 2020-05-02 DIAGNOSIS — G8929 Other chronic pain: Secondary | ICD-10-CM | POA: Diagnosis not present

## 2020-05-02 DIAGNOSIS — G4700 Insomnia, unspecified: Secondary | ICD-10-CM | POA: Diagnosis not present

## 2020-05-02 DIAGNOSIS — E119 Type 2 diabetes mellitus without complications: Secondary | ICD-10-CM | POA: Diagnosis not present

## 2020-05-02 DIAGNOSIS — M5021 Other cervical disc displacement,  high cervical region: Secondary | ICD-10-CM | POA: Diagnosis not present

## 2020-05-02 DIAGNOSIS — F310 Bipolar disorder, current episode hypomanic: Secondary | ICD-10-CM | POA: Diagnosis not present

## 2020-05-02 DIAGNOSIS — M5124 Other intervertebral disc displacement, thoracic region: Secondary | ICD-10-CM | POA: Diagnosis not present

## 2020-05-02 DIAGNOSIS — F4310 Post-traumatic stress disorder, unspecified: Secondary | ICD-10-CM | POA: Diagnosis not present

## 2020-05-04 ENCOUNTER — Other Ambulatory Visit: Payer: Self-pay | Admitting: *Deleted

## 2020-05-04 DIAGNOSIS — F4310 Post-traumatic stress disorder, unspecified: Secondary | ICD-10-CM | POA: Diagnosis not present

## 2020-05-04 DIAGNOSIS — F310 Bipolar disorder, current episode hypomanic: Secondary | ICD-10-CM | POA: Diagnosis not present

## 2020-05-04 DIAGNOSIS — G4700 Insomnia, unspecified: Secondary | ICD-10-CM | POA: Diagnosis not present

## 2020-05-04 DIAGNOSIS — G8929 Other chronic pain: Secondary | ICD-10-CM | POA: Diagnosis not present

## 2020-05-04 DIAGNOSIS — I10 Essential (primary) hypertension: Secondary | ICD-10-CM | POA: Diagnosis not present

## 2020-05-04 DIAGNOSIS — M5124 Other intervertebral disc displacement, thoracic region: Secondary | ICD-10-CM | POA: Diagnosis not present

## 2020-05-04 DIAGNOSIS — M5021 Other cervical disc displacement,  high cervical region: Secondary | ICD-10-CM | POA: Diagnosis not present

## 2020-05-04 DIAGNOSIS — F419 Anxiety disorder, unspecified: Secondary | ICD-10-CM | POA: Diagnosis not present

## 2020-05-04 DIAGNOSIS — E119 Type 2 diabetes mellitus without complications: Secondary | ICD-10-CM | POA: Diagnosis not present

## 2020-05-04 MED ORDER — ACCU-CHEK FASTCLIX LANCETS MISC: 11 refills | Status: DC

## 2020-05-04 NOTE — Telephone Encounter (Signed)
Patient requested refill

## 2020-05-08 ENCOUNTER — Ambulatory Visit: Payer: Medicare HMO | Admitting: Family

## 2020-05-22 ENCOUNTER — Other Ambulatory Visit: Payer: Medicare HMO

## 2020-05-22 DIAGNOSIS — F310 Bipolar disorder, current episode hypomanic: Secondary | ICD-10-CM

## 2020-05-22 DIAGNOSIS — E782 Mixed hyperlipidemia: Secondary | ICD-10-CM

## 2020-05-22 DIAGNOSIS — E119 Type 2 diabetes mellitus without complications: Secondary | ICD-10-CM

## 2020-05-22 DIAGNOSIS — I10 Essential (primary) hypertension: Secondary | ICD-10-CM

## 2020-05-23 DIAGNOSIS — S62325D Displaced fracture of shaft of fourth metacarpal bone, left hand, subsequent encounter for fracture with routine healing: Secondary | ICD-10-CM | POA: Diagnosis not present

## 2020-05-24 ENCOUNTER — Ambulatory Visit: Payer: Medicare HMO | Admitting: Family

## 2020-05-24 DIAGNOSIS — M5021 Other cervical disc displacement,  high cervical region: Secondary | ICD-10-CM | POA: Diagnosis not present

## 2020-05-24 DIAGNOSIS — E119 Type 2 diabetes mellitus without complications: Secondary | ICD-10-CM | POA: Diagnosis not present

## 2020-05-24 DIAGNOSIS — F4310 Post-traumatic stress disorder, unspecified: Secondary | ICD-10-CM | POA: Diagnosis not present

## 2020-05-24 DIAGNOSIS — F419 Anxiety disorder, unspecified: Secondary | ICD-10-CM | POA: Diagnosis not present

## 2020-05-24 DIAGNOSIS — G4700 Insomnia, unspecified: Secondary | ICD-10-CM | POA: Diagnosis not present

## 2020-05-24 DIAGNOSIS — F310 Bipolar disorder, current episode hypomanic: Secondary | ICD-10-CM | POA: Diagnosis not present

## 2020-05-24 DIAGNOSIS — I10 Essential (primary) hypertension: Secondary | ICD-10-CM | POA: Diagnosis not present

## 2020-05-24 DIAGNOSIS — G8929 Other chronic pain: Secondary | ICD-10-CM | POA: Diagnosis not present

## 2020-05-24 DIAGNOSIS — M5124 Other intervertebral disc displacement, thoracic region: Secondary | ICD-10-CM | POA: Diagnosis not present

## 2020-05-25 ENCOUNTER — Ambulatory Visit: Payer: Medicare HMO | Admitting: Family

## 2020-05-25 ENCOUNTER — Other Ambulatory Visit: Payer: Medicare HMO

## 2020-05-25 ENCOUNTER — Other Ambulatory Visit: Payer: Self-pay

## 2020-05-25 DIAGNOSIS — I10 Essential (primary) hypertension: Secondary | ICD-10-CM | POA: Diagnosis not present

## 2020-05-25 DIAGNOSIS — E782 Mixed hyperlipidemia: Secondary | ICD-10-CM | POA: Diagnosis not present

## 2020-05-25 DIAGNOSIS — F310 Bipolar disorder, current episode hypomanic: Secondary | ICD-10-CM | POA: Diagnosis not present

## 2020-05-25 DIAGNOSIS — E119 Type 2 diabetes mellitus without complications: Secondary | ICD-10-CM | POA: Diagnosis not present

## 2020-05-26 LAB — CBC WITH DIFFERENTIAL/PLATELET
Absolute Monocytes: 284 cells/uL (ref 200–950)
Basophils Absolute: 0 cells/uL (ref 0–200)
Basophils Relative: 0 %
Eosinophils Absolute: 0 cells/uL — ABNORMAL LOW (ref 15–500)
Eosinophils Relative: 0 %
HCT: 43.1 % (ref 35.0–45.0)
Hemoglobin: 14.4 g/dL (ref 11.7–15.5)
Lymphs Abs: 1094 cells/uL (ref 850–3900)
MCH: 29.9 pg (ref 27.0–33.0)
MCHC: 33.4 g/dL (ref 32.0–36.0)
MCV: 89.6 fL (ref 80.0–100.0)
MPV: 10.1 fL (ref 7.5–12.5)
Monocytes Relative: 6.3 %
Neutro Abs: 3123 cells/uL (ref 1500–7800)
Neutrophils Relative %: 69.4 %
Platelets: 223 10*3/uL (ref 140–400)
RBC: 4.81 10*6/uL (ref 3.80–5.10)
RDW: 12.6 % (ref 11.0–15.0)
Total Lymphocyte: 24.3 %
WBC: 4.5 10*3/uL (ref 3.8–10.8)

## 2020-05-26 LAB — COMPLETE METABOLIC PANEL WITH GFR
AG Ratio: 2.1 (calc) (ref 1.0–2.5)
ALT: 15 U/L (ref 6–29)
AST: 15 U/L (ref 10–35)
Albumin: 4.5 g/dL (ref 3.6–5.1)
Alkaline phosphatase (APISO): 74 U/L (ref 37–153)
BUN/Creatinine Ratio: 22 (calc) (ref 6–22)
BUN: 21 mg/dL (ref 7–25)
CO2: 23 mmol/L (ref 20–32)
Calcium: 9.4 mg/dL (ref 8.6–10.4)
Chloride: 102 mmol/L (ref 98–110)
Creat: 0.97 mg/dL — ABNORMAL HIGH (ref 0.60–0.93)
GFR, Est African American: 68 mL/min/{1.73_m2} (ref >=60)
GFR, Est Non African American: 59 mL/min/{1.73_m2} — ABNORMAL LOW (ref >=60)
Globulin: 2.1 g/dL (calc) (ref 1.9–3.7)
Glucose, Bld: 161 mg/dL — ABNORMAL HIGH (ref 65–99)
Potassium: 4.1 mmol/L (ref 3.5–5.3)
Sodium: 137 mmol/L (ref 135–146)
Total Bilirubin: 0.4 mg/dL (ref 0.2–1.2)
Total Protein: 6.6 g/dL (ref 6.1–8.1)

## 2020-05-26 LAB — HEMOGLOBIN A1C
Hgb A1c MFr Bld: 6.4 % of total Hgb — ABNORMAL HIGH (ref <5.7)
Mean Plasma Glucose: 137 mg/dL
eAG (mmol/L): 7.6 mmol/L

## 2020-05-26 LAB — LIPID PANEL
Cholesterol: 253 mg/dL — ABNORMAL HIGH (ref <200)
HDL: 52 mg/dL (ref >=50)
LDL Cholesterol (Calc): 163 mg/dL (calc) — ABNORMAL HIGH
Non-HDL Cholesterol (Calc): 201 mg/dL (calc) — ABNORMAL HIGH (ref <130)
Total CHOL/HDL Ratio: 4.9 (calc) (ref <5.0)
Triglycerides: 223 mg/dL — ABNORMAL HIGH (ref <150)

## 2020-05-26 LAB — TSH: TSH: 2.55 mIU/L (ref 0.40–4.50)

## 2020-05-28 ENCOUNTER — Encounter: Payer: Self-pay | Admitting: Family

## 2020-05-28 ENCOUNTER — Ambulatory Visit (INDEPENDENT_AMBULATORY_CARE_PROVIDER_SITE_OTHER): Payer: Medicare HMO | Admitting: Family

## 2020-05-28 ENCOUNTER — Other Ambulatory Visit: Payer: Self-pay

## 2020-05-28 VITALS — BP 140/88 | HR 92 | Temp 97.5°F | Resp 16 | Ht 63.0 in | Wt 191.4 lb

## 2020-05-28 DIAGNOSIS — M8589 Other specified disorders of bone density and structure, multiple sites: Secondary | ICD-10-CM

## 2020-05-28 DIAGNOSIS — E1169 Type 2 diabetes mellitus with other specified complication: Secondary | ICD-10-CM

## 2020-05-28 DIAGNOSIS — J302 Other seasonal allergic rhinitis: Secondary | ICD-10-CM

## 2020-05-28 DIAGNOSIS — E782 Mixed hyperlipidemia: Secondary | ICD-10-CM

## 2020-05-28 DIAGNOSIS — I10 Essential (primary) hypertension: Secondary | ICD-10-CM

## 2020-05-28 MED ORDER — CONTINUOUS GLUCOSE MONITOR SUP KIT: 1.0000 | PACK | Freq: Three times a day (TID) | 0 refills | Status: DC

## 2020-05-28 MED ORDER — LORATADINE 10 MG PO TABS: 10.0000 mg | ORAL_TABLET | Freq: Every day | ORAL | 1 refills | Status: DC

## 2020-05-28 NOTE — Progress Notes (Signed)
Provider: Marlowe Sax FNP-C   Vonnie Spagnolo, [name], NP  Patient Care Team: Dewanna Hurston, [name], NP as PCP - General (Family Medicine) Brien Few, MD as Consulting Physician (Obstetrics and Gynecology) Katy Apo, MD as Consulting Physician (Ophthalmology)  Extended Emergency Contact Information Primary Emergency Contact: [place], [place] Phone: [phone] Relation: Son  Code Status: Full Code  Goals of care: Advanced Directive information Advanced Directives 03/19/2020  Does Patient Have a Medical Advance Directive? Yes  Type of Paramedic of [place];Living will  Does patient want to make changes to medical advance directive? No - Patient declined  Copy of [place] in Chart? Yes - validated most recent copy scanned in chart (See row information)  Would patient like information on creating a medical advance directive? -     Chief Complaint  Patient presents with  . Medical Management of Chronic Issues    6 month follow up.  Marland Kitchen Health Maintenance    Discuss the need for Foot Exam.  . Immunizations    Discuss the need for Covid Vaccine.     HPI:  Pt is a 70 y.o. female seen today for 6 months follow up for medical management of chronic diseases.she denies any acute issues today.she continue to follow up with Orthopedic for left hand closed displaced fracture of 4th metacarpal.she seen by Dr.Kuzma Cristine Polio at [facility] in [place],05/23/2020.she was advised to continue on with splint and buddy strap with activity.Also continue with bone stimulator and follow up in 5 weeks for X-rays.States pain is well controlled.   Type 2 DM - states checks CBG three times daily no log for evaluation today.she request a continuous glucose monitor ordered so that she does not have to prick self three times daily or forget to check CBG.currnetly on metformin 500 tablet twice daily.lates Hgb  A1C 6.4,glucose 161 previous 6.1  She is due for annual diabetic foot exam.  Hyperlipidemia - chol 253,TRG 223,and LDL 163 statin intolerance. Dietary modification and exercise discussed.due to COVID-19 restriction has not gone back to her exercise water aerobics.Hope to get back to exercise as COVID-19 restrictions have decreased.    Hypertension - on lisinopril 10 mg tablet daily.No home reading for evaluation.she denies any signs of hypotension.  Seasonal allergies - continue to require loratadine 10 mg tablet daily.states symptoms usually worst with change of season.  Osteopenia - latest bone density done 06/25/2017 reviewed indicated Osteopenia on right total femur T-score -3.0 and left 1/3 radius T score 1.6 (06/25/2017). No recent fall episode.continues to follow up with Orthopedic. on vitamin D supplement since she had COVID-19.    Past Medical History:  Diagnosis Date  . Anxiety   . Bipolar 1 disorder ([place])   . Bulging of cervical intervertebral disc    C2  . Bulging of thoracic intervertebral disc    t4  . Depression   . Diabetes mellitus without complication ([place])   . Hyperlipidemia   . PTSD (post-traumatic stress disorder)   . Sad    Past Surgical History:  Procedure Laterality Date  . CATARACT EXTRACTION Right 08/11/2017  . CATARACT EXTRACTION Left 09/01/2017  . EYE SURGERY    . HEMICOLECTOMY  1997  . JOINT REPLACEMENT  2013   hip    Allergies  Allergen Reactions  . Aspirin Other (See Comments)  . Other Other (See Comments)    "makes my urine turn brown"  Other reaction(s): MUSCLE PAIN "makes my urine turn brown"  . Oxycodone     Migraines  . Statins Other (See Comments)    "makes my urine turn brown" Other reaction(s): MUSCLE PAIN "makes my urine turn brown"    Allergies as of 05/28/2020      Reactions   Aspirin Other (See Comments)   Other Other (See Comments)   "makes my urine turn brown" Other reaction(s): MUSCLE PAIN "makes my urine turn brown"    Oxycodone    Migraines   Statins Other (See Comments)   "makes my urine turn brown" Other reaction(s): MUSCLE PAIN "makes my urine turn brown"      Medication List       Accurate as of May 28, 2020 11:48 AM. If you have any questions, ask your nurse or doctor.        Accu-Chek FastClix Lancets Misc Use to test blood sugar three times daily. Dx: E11.9   Accu-Chek Guide test strip Generic drug: glucose blood Use to test blood sugar three times daily. Dx: E11.9   ARIPiprazole ER 300 MG Prsy prefilled syringe Commonly known as: ABILIFY MAINTENA Inject 300 mg into the muscle every 30 (thirty) days. Last injection 10/28/2019   ASTAXANTHIN PO Take 12 mg by mouth daily.   BERBERINE COMPLEX PO Take by mouth. 500 mg 3-4 times daily   clonazePAM 1 MG tablet Commonly known as: KLONOPIN 0.5 mg.   gabapentin 300 MG capsule Commonly known as: NEURONTIN Take one capsule by mouth three times daily   lamoTRIgine 200 MG tablet Commonly known as: LAMICTAL Take 200 mg by mouth 2 (two) times daily.   lisinopril 10 MG tablet Commonly known as: ZESTRIL TAKE 1 TABLET EVERY DAY   loratadine 10 MG tablet Commonly known as: CLARITIN Take 1 tablet (10 mg total) by mouth daily.   metFORMIN 500 MG tablet Commonly known as: GLUCOPHAGE TAKE 1 TABLET TWICE DAILY WITH MEALS   methocarbamol 500 MG tablet Commonly known as: ROBAXIN Take 1 tablet (500 mg total) by mouth every 8 (eight) hours as needed for muscle spasms.   naproxen 500 MG tablet Commonly known as: NAPROSYN Take 1 tablet (500 mg total) by mouth 2 (two) times daily as needed (PAIN). Take with food   sertraline 25 MG tablet Commonly known as: ZOLOFT Take 25 mg by mouth daily.   traZODone 50 MG tablet Commonly known as: DESYREL Take 25 mg by mouth as needed.   vitamin C 1000 MG tablet Take 1,000 mg by mouth daily.       Review of Systems  Constitutional: Negative for appetite change, chills, fatigue and  fever.  HENT: Negative for congestion, rhinorrhea, sinus pressure, sinus pain, sneezing, sore throat and trouble swallowing.        Seasonal allergies   Eyes: Negative for discharge, redness, itching and visual disturbance.       Wears reading glasses   Respiratory: Negative for cough, chest tightness, shortness of breath and wheezing.   Cardiovascular: Negative for chest pain, palpitations and leg swelling.  Gastrointestinal: Negative for abdominal distention, abdominal pain, constipation, diarrhea, nausea and vomiting.  Endocrine: Negative for cold intolerance, heat intolerance, polydipsia, polyphagia and polyuria.  Genitourinary: Negative for difficulty urinating, dysuria, flank pain, frequency and urgency.  Musculoskeletal: Positive for arthralgias and neck pain. Negative for gait problem, joint swelling and myalgias.  Skin: Negative for color change, pallor, rash and wound.  Neurological: Negative for dizziness, speech difficulty, weakness, light-headedness and numbness.  Headache sometimes exercises helps after half an hour   Hematological: Does not bruise/bleed easily.  Psychiatric/Behavioral: Negative for agitation, behavioral problems, confusion and sleep disturbance. The patient is nervous/anxious.     Immunization History  Administered Date(s) Administered  . Fluad Quad(high Dose 65+) 12/21/2018, 12/02/2019  . Influenza,inj,Quad PF,6+ Mos 04/04/2016  . Influenza-Unspecified 12/29/2013, 12/14/2017  . PFIZER(Purple Top)SARS-COV-2 Vaccination 08/18/2019, 09/12/2019  . Pneumococcal Conjugate-13 01/04/2016  . Pneumococcal Polysaccharide-23 09/30/2017  . Tdap 09/29/2015  . Zoster Recombinat (Shingrix) 12/05/2017, 06/03/2018   Pertinent  Health Maintenance Due  Topic Date Due  . FOOT EXAM  01/06/2019  . HEMOGLOBIN A1C  11/22/2020  . OPHTHALMOLOGY EXAM  04/04/2021  . MAMMOGRAM  11/06/2021  . COLONOSCOPY (Pts 45-47yr Insurance coverage will need to be confirmed)  10/18/2028   . INFLUENZA VACCINE  Completed  . DEXA SCAN  Completed  . PNA vac Low Risk Adult  Completed   Fall Risk  05/28/2020 03/19/2020 12/02/2019 11/11/2019 11/01/2019  Falls in the past year? 0 0 0 1 1  Number falls in past yr: 0 0 0 0 1  Comment - - - - -  Injury with Fall? 0 0 0 0 1  Comment - - - - -  Risk for fall due to : - - - - -   Functional Status Survey:    Vitals:   05/28/20 1111  BP: 140/88  Pulse: 92  Resp: 16  Temp: (!) 97.5 F (36.4 C)  SpO2: 97%  Weight: 191 lb 6.4 oz (86.8 kg)  Height: _0  (1.6 m)   Body mass index is 33.9 kg/m. Physical Exam Vitals reviewed.  Constitutional:      General: She is not in acute distress.    Appearance: She is obese. She is not ill-appearing.  HENT:     Head: Normocephalic.     Right Ear: Tympanic membrane, ear canal and external ear normal. There is no impacted cerumen.     Left Ear: Tympanic membrane, ear canal and external ear normal. There is no impacted cerumen.     Nose: Nose normal. No congestion or rhinorrhea.     Mouth/Throat:     Mouth: Mucous membranes are moist.     Pharynx: Oropharynx is clear. No oropharyngeal exudate or posterior oropharyngeal erythema.  Eyes:     General: No scleral icterus.       Right eye: No discharge.        Left eye: No discharge.     Extraocular Movements: Extraocular movements intact.     Conjunctiva/sclera: Conjunctivae normal.     Pupils: Pupils are equal, round, and reactive to light.  Neck:     Vascular: No carotid bruit.  Cardiovascular:     Rate and Rhythm: Normal rate and regular rhythm.     Pulses: Normal pulses.     Heart sounds: Normal heart sounds. No murmur heard. No friction rub. No gallop.   Pulmonary:     Effort: Pulmonary effort is normal. No respiratory distress.     Breath sounds: Normal breath sounds. No wheezing, rhonchi or rales.  Chest:     Chest wall: No tenderness.  Abdominal:     General: Bowel sounds are normal. There is no distension.     Palpations:  Abdomen is soft. There is no mass.     Tenderness: There is no abdominal tenderness. There is no right CVA tenderness, left CVA tenderness, guarding or rebound.  Musculoskeletal:        General:  No swelling or tenderness. Normal range of motion.     Cervical back: Normal range of motion. No rigidity or tenderness.     Right lower leg: No edema.     Left lower leg: No edema.     Comments: Left hand 4 th and 5 th fingers splint and buddy strap in place.   Lymphadenopathy:     Cervical: No cervical adenopathy.  Skin:    General: Skin is warm and dry.     Coloration: Skin is not pale.     Findings: No bruising, erythema or rash.  Neurological:     Mental Status: She is alert and oriented to person, place, and time.     Cranial Nerves: No cranial nerve deficit.     Sensory: No sensory deficit.     Motor: No weakness.     Coordination: Coordination normal.     Gait: Gait normal.  Psychiatric:        Mood and Affect: Mood normal.        Behavior: Behavior normal.        Thought Content: Thought content normal.        Judgment: Judgment normal.    Labs reviewed: Recent Labs    10/24/19 0931 05/25/20 0834  NA 136 137  K 4.4 4.1  CL 102 102  CO2 19* 23  GLUCOSE 133* 161*  BUN 29* 21  CREATININE 1.01* 0.97*  CALCIUM 9.3 9.4   Recent Labs    10/24/19 0931 05/25/20 0834  AST 15 15  ALT 15 15  BILITOT 0.5 0.4  PROT 6.8 6.6   Recent Labs    10/24/19 0931 05/25/20 0834  WBC 5.2 4.5  NEUTROABS 3,656 3,123  HGB 13.3 14.4  HCT 40.5 43.1  MCV 90.6 89.6  PLT 224 223   Lab Results  Component Value Date   TSH 2.55 05/25/2020   Lab Results  Component Value Date   HGBA1C 6.4 (H) 05/25/2020   Lab Results  Component Value Date   CHOL 253 (H) 05/25/2020   HDL 52 05/25/2020   LDLCALC 163 (H) 05/25/2020   TRIG 223 (H) 05/25/2020   CHOLHDL 4.9 05/25/2020    Significant Diagnostic Results in last 30 days:  No results found.  Assessment/Plan 1. Type 2 diabetes  mellitus with other specified complication, without long-term current use of insulin (HCC) Lab Results  Component Value Date   HGBA1C 6.4 (H) 05/25/2020  - continue on metformin  - continue on ACE inhibitor  - foot exam completed.Monofilament sensation intact  - Continuous Glucose Monitor Sup KIT; 1 kit by Does not apply route 3 (three) times daily.  Dispense: 1 kit; Refill: 0 - CBC with Differential/Platelet; Future - CMP with eGFR([facility]); Future - TSH; Future - Lipid panel; Future  2. Osteopenia of multiple sites - advised to change vitamin D to [place] one by mouth daily. - DG Bone Density; Future  3. Essential hypertension B/p not at goal this visit.will monitor  - continue on lisinopril  - CBC with Differential/Platelet; Future - CMP with eGFR([facility]); Future - TSH; Future  4. Mixed hyperlipidemia LDL not at goal - continue on dietary modification.Advised to resume her Quartic exercises  - Statin intolerance  - Lipid panel; Future  5. Seasonal allergic rhinitis, unspecified trigger Stable on loratadine  - loratadine (CLARITIN) 10 MG tablet; Take 1 tablet (10 mg total) by mouth daily.  Dispense: 90 tablet; Refill: 1   Family/ staff Communication: Reviewed plan of care with  patient verbalized understanding.   Labs/tests ordered:  - CBC with Differential/Platelet; Future - CMP with eGFR([facility]); Future - TSH; Future - Lipid panel; Future - DG Bone Density; Future  Next Appointment : 6 months for medical management of chronic issues.Fasting Labs prior to visit.   Sandrea Hughs, NP

## 2020-05-30 DIAGNOSIS — F3181 Bipolar II disorder: Secondary | ICD-10-CM | POA: Diagnosis not present

## 2020-06-03 MED ORDER — CALCIUM CARBONATE-VITAMIN D 500-200 MG-UNIT PO TABS: 1.0000 | ORAL_TABLET | Freq: Every day | ORAL | 3 refills | Status: DC

## 2020-06-04 DIAGNOSIS — M792 Neuralgia and neuritis, unspecified: Secondary | ICD-10-CM | POA: Diagnosis not present

## 2020-06-04 DIAGNOSIS — L84 Corns and callosities: Secondary | ICD-10-CM | POA: Diagnosis not present

## 2020-06-04 DIAGNOSIS — M205X1 Other deformities of toe(s) (acquired), right foot: Secondary | ICD-10-CM | POA: Diagnosis not present

## 2020-06-04 DIAGNOSIS — I739 Peripheral vascular disease, unspecified: Secondary | ICD-10-CM | POA: Diagnosis not present

## 2020-06-04 DIAGNOSIS — E1351 Other specified diabetes mellitus with diabetic peripheral angiopathy without gangrene: Secondary | ICD-10-CM | POA: Diagnosis not present

## 2020-06-04 DIAGNOSIS — L602 Onychogryphosis: Secondary | ICD-10-CM | POA: Diagnosis not present

## 2020-06-14 ENCOUNTER — Ambulatory Visit: Payer: Medicare HMO | Admitting: Adult Health

## 2020-06-27 DIAGNOSIS — S62325D Displaced fracture of shaft of fourth metacarpal bone, left hand, subsequent encounter for fracture with routine healing: Secondary | ICD-10-CM | POA: Diagnosis not present

## 2020-06-29 DIAGNOSIS — M5124 Other intervertebral disc displacement, thoracic region: Secondary | ICD-10-CM | POA: Diagnosis not present

## 2020-06-29 DIAGNOSIS — M5021 Other cervical disc displacement,  high cervical region: Secondary | ICD-10-CM | POA: Diagnosis not present

## 2020-06-29 DIAGNOSIS — F419 Anxiety disorder, unspecified: Secondary | ICD-10-CM | POA: Diagnosis not present

## 2020-06-29 DIAGNOSIS — E119 Type 2 diabetes mellitus without complications: Secondary | ICD-10-CM | POA: Diagnosis not present

## 2020-06-29 DIAGNOSIS — F4310 Post-traumatic stress disorder, unspecified: Secondary | ICD-10-CM | POA: Diagnosis not present

## 2020-06-29 DIAGNOSIS — F310 Bipolar disorder, current episode hypomanic: Secondary | ICD-10-CM | POA: Diagnosis not present

## 2020-06-29 DIAGNOSIS — G4700 Insomnia, unspecified: Secondary | ICD-10-CM | POA: Diagnosis not present

## 2020-06-29 DIAGNOSIS — I10 Essential (primary) hypertension: Secondary | ICD-10-CM | POA: Diagnosis not present

## 2020-06-29 DIAGNOSIS — G8929 Other chronic pain: Secondary | ICD-10-CM | POA: Diagnosis not present

## 2020-07-09 DIAGNOSIS — F3181 Bipolar II disorder: Secondary | ICD-10-CM | POA: Diagnosis not present

## 2020-07-10 DIAGNOSIS — F310 Bipolar disorder, current episode hypomanic: Secondary | ICD-10-CM | POA: Diagnosis not present

## 2020-07-10 DIAGNOSIS — G8929 Other chronic pain: Secondary | ICD-10-CM | POA: Diagnosis not present

## 2020-07-10 DIAGNOSIS — M5124 Other intervertebral disc displacement, thoracic region: Secondary | ICD-10-CM | POA: Diagnosis not present

## 2020-07-10 DIAGNOSIS — M5021 Other cervical disc displacement,  high cervical region: Secondary | ICD-10-CM | POA: Diagnosis not present

## 2020-07-10 DIAGNOSIS — F419 Anxiety disorder, unspecified: Secondary | ICD-10-CM | POA: Diagnosis not present

## 2020-07-10 DIAGNOSIS — F4310 Post-traumatic stress disorder, unspecified: Secondary | ICD-10-CM | POA: Diagnosis not present

## 2020-07-10 DIAGNOSIS — G4700 Insomnia, unspecified: Secondary | ICD-10-CM | POA: Diagnosis not present

## 2020-07-10 DIAGNOSIS — E119 Type 2 diabetes mellitus without complications: Secondary | ICD-10-CM | POA: Diagnosis not present

## 2020-07-10 DIAGNOSIS — I10 Essential (primary) hypertension: Secondary | ICD-10-CM | POA: Diagnosis not present

## 2020-07-18 DIAGNOSIS — G8929 Other chronic pain: Secondary | ICD-10-CM | POA: Diagnosis not present

## 2020-07-18 DIAGNOSIS — E119 Type 2 diabetes mellitus without complications: Secondary | ICD-10-CM | POA: Diagnosis not present

## 2020-07-18 DIAGNOSIS — F419 Anxiety disorder, unspecified: Secondary | ICD-10-CM | POA: Diagnosis not present

## 2020-07-18 DIAGNOSIS — F310 Bipolar disorder, current episode hypomanic: Secondary | ICD-10-CM | POA: Diagnosis not present

## 2020-07-18 DIAGNOSIS — M5124 Other intervertebral disc displacement, thoracic region: Secondary | ICD-10-CM | POA: Diagnosis not present

## 2020-07-18 DIAGNOSIS — I10 Essential (primary) hypertension: Secondary | ICD-10-CM | POA: Diagnosis not present

## 2020-07-18 DIAGNOSIS — M5021 Other cervical disc displacement,  high cervical region: Secondary | ICD-10-CM | POA: Diagnosis not present

## 2020-07-18 DIAGNOSIS — F4310 Post-traumatic stress disorder, unspecified: Secondary | ICD-10-CM | POA: Diagnosis not present

## 2020-07-23 ENCOUNTER — Encounter: Payer: Self-pay | Admitting: Family

## 2020-07-23 ENCOUNTER — Telehealth: Payer: Self-pay | Admitting: *Deleted

## 2020-07-23 ENCOUNTER — Ambulatory Visit (INDEPENDENT_AMBULATORY_CARE_PROVIDER_SITE_OTHER): Payer: Medicare HMO | Admitting: Family

## 2020-07-23 ENCOUNTER — Other Ambulatory Visit: Payer: Self-pay | Admitting: *Deleted

## 2020-07-23 ENCOUNTER — Other Ambulatory Visit: Payer: Self-pay

## 2020-07-23 VITALS — BP 126/88 | HR 113 | Temp 97.1°F | Resp 16 | Ht 63.0 in | Wt 188.4 lb

## 2020-07-23 DIAGNOSIS — B372 Candidiasis of skin and nail: Secondary | ICD-10-CM

## 2020-07-23 DIAGNOSIS — E1169 Type 2 diabetes mellitus with other specified complication: Secondary | ICD-10-CM

## 2020-07-23 LAB — GLUCOSE, POCT (MANUAL RESULT ENTRY): POC Glucose: 162 mg/dl — AB (ref 70–99)

## 2020-07-23 MED ORDER — FREESTYLE LIBRE 14 DAY SENSOR MISC: 1.0000 "application " | Freq: Every day | 5 refills | Status: AC

## 2020-07-23 MED ORDER — FREESTYLE LIBRE 14 DAY SENSOR MISC: 1.0000 "application " | Freq: Every day | 5 refills | Status: DC

## 2020-07-23 MED ORDER — METFORMIN HCL ER (MOD) 1000 MG PO TB24: 1000.0000 mg | ORAL_TABLET | Freq: Every day | ORAL | 0 refills | Status: DC

## 2020-07-23 MED ORDER — FREESTYLE LIBRE 14 DAY READER DEVI
1.0000 | Freq: Every day | 5 refills | Status: DC
Start: 2020-07-23 — End: 2020-10-02

## 2020-07-23 MED ORDER — NYSTATIN 100000 UNIT/GM EX CREA: 1.0000 "application " | TOPICAL_CREAM | Freq: Two times a day (BID) | CUTANEOUS | 0 refills | Status: DC

## 2020-07-23 NOTE — Telephone Encounter (Signed)
Nystatin cream script send

## 2020-07-23 NOTE — Telephone Encounter (Signed)
Received fax from [facility] for Prior Authorization for Metformin ER 1000mg  Modified Tab.  Initiated through [employer]: [id number] PA Case: [id number] Went into determination.

## 2020-07-23 NOTE — Progress Notes (Signed)
Provider: Marlowe Sax FNP-C  Londyn Hotard, [name], NP  Patient Care Team: Lukis Bunt, [name], NP as PCP - General (Family Medicine) Brien Few, MD as Consulting Physician (Obstetrics and Gynecology) Katy Apo, MD as Consulting Physician (Ophthalmology)  Extended Emergency Contact Information Primary Emergency Contact: [place], [place] Phone: [phone] Relation: Son  Code Status:  Full Code  Goals of care: Advanced Directive information Advanced Directives 07/23/2020  Does Patient Have a Medical Advance Directive? Yes  Type of Paramedic of [place];Living will  Does patient want to make changes to medical advance directive? No - Patient declined  Copy of [place] in Chart? Yes - validated most recent copy scanned in chart (See row information)  Would patient like information on creating a medical advance directive? -     Chief Complaint  Patient presents with  . Acute Visit    Patient wants to discuss glucose monitor and levels fluctuating.     HPI:  Pt is a 70 y.o. female seen today for an acute visit for evaluation of blood sugars.States CBG in the morning averages 160 but fasting before meals are in the 110's -120's and post prandial ranges in the 140's - 160's.States last night had potatoe for dinner CBG was 220 she is concern that CBG spikes up and down.currently on Metformin 500 mg tablet twice daily.she was previous on extended release which was recalled but states her brother's Endocrinologist has been prescribing for the brother who is also diabetic.she request script to be send today for metformin ER. States has been missing to take the evening dose of metformin and sometimes remembers last in the night.States taking once a day ER tablet will help with her memory loss.  No symptoms of hypoglycemia reported.Also request Free style Elenor Legato to be ordered to enable her  to monitor blood sugars more closely.   Has been itching on right groin areas.  Past Medical History:  Diagnosis Date  . Anxiety   . Bipolar 1 disorder ([place])   . Bulging of cervical intervertebral disc    C2  . Bulging of thoracic intervertebral disc    t4  . Depression   . Diabetes mellitus without complication ([place])   . Hyperlipidemia   . PTSD (post-traumatic stress disorder)   . Sad    Past Surgical History:  Procedure Laterality Date  . CATARACT EXTRACTION Right 08/11/2017  . CATARACT EXTRACTION Left 09/01/2017  . EYE SURGERY    . HEMICOLECTOMY  1997  . JOINT REPLACEMENT  2013   hip    Allergies  Allergen Reactions  . Aspirin Other (See Comments)  . Other Other (See Comments)    "makes my urine turn brown" Other reaction(s): MUSCLE PAIN "makes my urine turn brown"  . Oxycodone     Migraines  . Statins Other (See Comments)    "makes my urine turn brown" Other reaction(s): MUSCLE PAIN "makes my urine turn brown"    Outpatient Encounter Medications as of 07/23/2020  Medication Sig  . Accu-Chek FastClix Lancets MISC Use to test blood sugar three times daily. Dx: E11.9  . ARIPiprazole ER (ABILIFY MAINTENA) 300 MG PRSY prefilled syringe Inject 300 mg into the muscle every 30 (thirty) days. Last injection 10/28/2019  . Ascorbic Acid (VITAMIN C) 1000 MG tablet Take 1,000 mg by mouth daily.  . ASTAXANTHIN PO Take 12 mg by mouth daily.   Jolyne Loa  Grape-Goldenseal (BERBERINE COMPLEX PO) Take by mouth. 500 mg 3-4 times daily  . clonazePAM (KLONOPIN) 1 MG tablet 0.5 mg.  . Continuous Glucose Monitor Sup KIT 1 kit by Does not apply route 3 (three) times daily.  Marland Kitchen gabapentin (NEURONTIN) 300 MG capsule Take one capsule by mouth three times daily  . glucose blood (ACCU-CHEK GUIDE) test strip Use to test blood sugar three times daily. Dx: E11.9  . lamoTRIgine (LAMICTAL) 200 MG tablet Take 200 mg by mouth 2 (two) times daily.   Marland Kitchen lisinopril (ZESTRIL) 10 MG tablet TAKE 1  TABLET EVERY DAY  . loratadine (CLARITIN) 10 MG tablet Take 1 tablet (10 mg total) by mouth daily.  . metFORMIN (GLUCOPHAGE) 500 MG tablet TAKE 1 TABLET TWICE DAILY WITH MEALS  . sertraline (ZOLOFT) 25 MG tablet Take 25 mg by mouth daily.  . [DISCONTINUED] calcium-vitamin D (OSCAL WITH D) 500-200 MG-UNIT tablet Take 1 tablet by mouth daily with breakfast.  . [DISCONTINUED] methocarbamol (ROBAXIN) 500 MG tablet Take 1 tablet (500 mg total) by mouth every 8 (eight) hours as needed for muscle spasms. (Patient not taking: Reported on 05/28/2020)  . [DISCONTINUED] naproxen (NAPROSYN) 500 MG tablet Take 1 tablet (500 mg total) by mouth 2 (two) times daily as needed (PAIN). Take with food (Patient not taking: Reported on 05/28/2020)  . [DISCONTINUED] traZODone (DESYREL) 50 MG tablet Take 25 mg by mouth as needed.   No facility-administered encounter medications on file as of 07/23/2020.    Review of Systems  Constitutional: Negative for appetite change, chills, fatigue and fever.  Respiratory: Negative for cough, chest tightness, shortness of breath and wheezing.   Cardiovascular: Negative for chest pain, palpitations and leg swelling.  Gastrointestinal: Negative for abdominal distention, abdominal pain, constipation, diarrhea, nausea and vomiting.  Endocrine: Negative for cold intolerance, heat intolerance, polydipsia, polyphagia and polyuria.  Skin: Positive for color change. Negative for pallor, rash and wound.       Right groin area itchy   Neurological: Negative for dizziness, speech difficulty, weakness, light-headedness, numbness and headaches.    Immunization History  Administered Date(s) Administered  . Fluad Quad(high Dose 65+) 12/21/2018, 12/02/2019  . Influenza,inj,Quad PF,6+ Mos 04/04/2016  . Influenza-Unspecified 12/29/2013, 12/14/2017  . PFIZER(Purple Top)SARS-COV-2 Vaccination 08/18/2019, 09/12/2019  . Pneumococcal Conjugate-13 01/04/2016  . Pneumococcal Polysaccharide-23  09/30/2017  . Tdap 09/29/2015  . Zoster Recombinat (Shingrix) 12/05/2017, 06/03/2018   Pertinent  Health Maintenance Due  Topic Date Due  . INFLUENZA VACCINE  10/29/2020  . HEMOGLOBIN A1C  11/22/2020  . OPHTHALMOLOGY EXAM  04/04/2021  . FOOT EXAM  05/28/2021  . MAMMOGRAM  11/06/2021  . COLONOSCOPY (Pts 45-35yr Insurance coverage will need to be confirmed)  10/18/2028  . DEXA SCAN  Completed  . PNA vac Low Risk Adult  Completed   Fall Risk  07/23/2020 05/28/2020 03/19/2020 12/02/2019 11/11/2019  Falls in the past year? 0 0 0 0 1  Number falls in past yr: 0 0 0 0 0  Comment - - - - -  Injury with Fall? 0 0 0 0 0  Comment - - - - -  Risk for fall due to : - - - - -   Functional Status Survey:    Vitals:   07/23/20 1001  BP: 126/88  Pulse: (!) 113  Resp: 16  Temp: (!) 97.1 F (36.2 C)  SpO2: 96%  Weight: 188 lb 6.4 oz (85.5 kg)  Height: _0  (1.6 m)   Body mass index is 33.37 kg/m.  Physical Exam Vitals reviewed.  Constitutional:      General: She is not in acute distress.    Appearance: Normal appearance. She is obese. She is not ill-appearing or diaphoretic.  HENT:     Head: Normocephalic.  Eyes:     General: No scleral icterus.       Right eye: No discharge.        Left eye: No discharge.     Conjunctiva/sclera: Conjunctivae normal.     Pupils: Pupils are equal, round, and reactive to light.  Neck:     Vascular: No carotid bruit.  Cardiovascular:     Rate and Rhythm: Normal rate and regular rhythm.     Pulses: Normal pulses.     Heart sounds: Normal heart sounds. No murmur heard. No friction rub. No gallop.   Pulmonary:     Effort: Pulmonary effort is normal. No respiratory distress.     Breath sounds: Normal breath sounds. No wheezing, rhonchi or rales.  Chest:     Chest wall: No tenderness.  Abdominal:     General: Bowel sounds are normal. There is no distension.     Palpations: Abdomen is soft. There is no mass.     Tenderness: There is no abdominal  tenderness. There is no right CVA tenderness, left CVA tenderness, guarding or rebound.  Musculoskeletal:        General: No swelling or tenderness. Normal range of motion.     Cervical back: Normal range of motion. No rigidity or tenderness.     Right lower leg: No edema.     Left lower leg: No edema.  Lymphadenopathy:     Cervical: No cervical adenopathy.  Skin:    General: Skin is warm and dry.     Coloration: Skin is not pale.     Findings: Erythema present. No bruising, lesion or rash.     Comments: Right abdominal skin fold beefy redness noted.no strong odor   Neurological:     Mental Status: She is alert and oriented to person, place, and time.     Motor: No weakness.     Coordination: Coordination normal.     Gait: Gait normal.  Psychiatric:        Mood and Affect: Mood normal.        Speech: Speech normal.        Behavior: Behavior normal.        Thought Content: Thought content normal.        Judgment: Judgment normal.    Labs reviewed: Recent Labs    10/24/19 0931 05/25/20 0834  NA 136 137  K 4.4 4.1  CL 102 102  CO2 19* 23  GLUCOSE 133* 161*  BUN 29* 21  CREATININE 1.01* 0.97*  CALCIUM 9.3 9.4   Recent Labs    10/24/19 0931 05/25/20 0834  AST 15 15  ALT 15 15  BILITOT 0.5 0.4  PROT 6.8 6.6   Recent Labs    10/24/19 0931 05/25/20 0834  WBC 5.2 4.5  NEUTROABS 3,656 3,123  HGB 13.3 14.4  HCT 40.5 43.1  MCV 90.6 89.6  PLT 224 223   Lab Results  Component Value Date   TSH 2.55 05/25/2020   Lab Results  Component Value Date   HGBA1C 6.4 (H) 05/25/2020   Lab Results  Component Value Date   CHOL 253 (H) 05/25/2020   HDL 52 05/25/2020   LDLCALC 163 (H) 05/25/2020   TRIG 223 (H) 05/25/2020   CHOLHDL  4.9 05/25/2020    Significant Diagnostic Results in last 30 days:  No results found.  Assessment/Plan 1. Type 2 diabetes mellitus with other specified complication, without long-term current use of insulin (HCC) Lab Results  Component  Value Date   HGBA1C 6.4 (H) 05/25/2020  CBG stable but worried that readings keeping rising and going down in the 110's  Request metformin ER aware medication was recalled but states her brother was recently prescribed by Endocrinologist thinks this will help so that she does not forget the evening dose.  Also request continuous Free style Elenor Legato will pay out of pocket if not covered by her insurance.  - POC Glucose (CBG) - metFORMIN (GLUMETZA) 1000 MG (MOD) 24 hr tablet; Take 1 tablet (1,000 mg total) by mouth daily with breakfast.  Dispense: 90 tablet; Refill: 0  2. Candidiasis of skin Right groin beefy redness noted. Advised to avoid scratching area. - start on Nystatin as below.  - nystatin cream (MYCOSTATIN); Apply 1 application topically 2 (two) times daily.  Dispense: 30 g; Refill: 0  Family/ staff Communication: Reviewed plan of care with patient verbalized understanding   Labs/tests ordered: - POC Glucose (CBG)   Next Appointment: Has appointment in place   Sandrea Hughs, NP

## 2020-07-23 NOTE — Telephone Encounter (Signed)
Patient requested 2 Libre sensors to last her 30 days. Sent to pharmacy.   Patient stated that [facility]mith was going to call in some Nystatin Cream but the pharmacy has not received a Rx.   Please Advise.

## 2020-07-24 NOTE — Telephone Encounter (Signed)
Metformin ER 1000 mg denied, notice of denial of medicare part D prescription drug coverage. Incoming correspondence routed to [employer] electronically (view under media tab)

## 2020-07-24 NOTE — Telephone Encounter (Signed)
Please notify patient that Metformin ER 1000 mg tablet was denied she had insisted on ordering extended Release during visit despised provider's advise.  Start on Metformin 1000 mg tablet one by mouth daily.

## 2020-07-25 NOTE — Telephone Encounter (Signed)
Discussed response with patient. Patient states she will experiment with taking metformin in the am and call if it does not address the issue of elevated blood sugar at night

## 2020-07-26 DIAGNOSIS — M5021 Other cervical disc displacement,  high cervical region: Secondary | ICD-10-CM | POA: Diagnosis not present

## 2020-07-26 DIAGNOSIS — F310 Bipolar disorder, current episode hypomanic: Secondary | ICD-10-CM | POA: Diagnosis not present

## 2020-07-26 DIAGNOSIS — F419 Anxiety disorder, unspecified: Secondary | ICD-10-CM | POA: Diagnosis not present

## 2020-07-26 DIAGNOSIS — G8929 Other chronic pain: Secondary | ICD-10-CM | POA: Diagnosis not present

## 2020-07-26 DIAGNOSIS — I10 Essential (primary) hypertension: Secondary | ICD-10-CM | POA: Diagnosis not present

## 2020-07-26 DIAGNOSIS — M5124 Other intervertebral disc displacement, thoracic region: Secondary | ICD-10-CM | POA: Diagnosis not present

## 2020-07-26 DIAGNOSIS — E119 Type 2 diabetes mellitus without complications: Secondary | ICD-10-CM | POA: Diagnosis not present

## 2020-07-26 DIAGNOSIS — G4700 Insomnia, unspecified: Secondary | ICD-10-CM | POA: Diagnosis not present

## 2020-07-26 DIAGNOSIS — F4310 Post-traumatic stress disorder, unspecified: Secondary | ICD-10-CM | POA: Diagnosis not present

## 2020-08-03 ENCOUNTER — Other Ambulatory Visit: Payer: Self-pay

## 2020-08-03 ENCOUNTER — Ambulatory Visit (INDEPENDENT_AMBULATORY_CARE_PROVIDER_SITE_OTHER): Payer: Medicare HMO | Admitting: Family

## 2020-08-03 ENCOUNTER — Encounter: Payer: Self-pay | Admitting: Family

## 2020-08-03 VITALS — BP 110/70 | HR 108 | Temp 96.9°F | Resp 16 | Ht 63.0 in

## 2020-08-03 DIAGNOSIS — E1169 Type 2 diabetes mellitus with other specified complication: Secondary | ICD-10-CM

## 2020-08-03 DIAGNOSIS — R0989 Other specified symptoms and signs involving the circulatory and respiratory systems: Secondary | ICD-10-CM

## 2020-08-03 LAB — POCT INFLUENZA A/B
Influenza A, POC: NEGATIVE
Influenza B, POC: NEGATIVE

## 2020-08-03 MED ORDER — METFORMIN HCL 1000 MG PO TABS: 1000.0000 mg | ORAL_TABLET | Freq: Every day | ORAL | 3 refills | Status: DC

## 2020-08-03 NOTE — Progress Notes (Signed)
Provider: Marlowe Sax FNP-C  Kaitlyn Wood, [name], NP  Patient Care Team: Jere Vanburen, [name], NP as PCP - General (Family Medicine) Brien Few, MD as Consulting Physician (Obstetrics and Gynecology) Katy Apo, MD as Consulting Physician (Ophthalmology)  Extended Emergency Contact Information Primary Emergency Contact: [place], [place] of [place] Phone: [phone] Relation: Son  Code Status:  Full Code  Goals of care: Advanced Directive information Advanced Directives 08/03/2020  Does Patient Have a Medical Advance Directive? Yes  Type of Paramedic of [place];Living will  Does patient want to make changes to medical advance directive? No - Patient declined  Copy of [place] in Chart? Yes - validated most recent copy scanned in chart (See row information)  Would patient like information on creating a medical advance directive? -     Chief Complaint  Patient presents with  . Acute Visit    Sore throat, congestion, fatigue, body aches, and fever;     HPI:  Pt is a 70 y.o. female seen today for an acute visit for evaluation of sore throat,congestion,generalized body aches,fatigue and fever x 4 days Tuesday Temp 99.1 yesterday.No fever today but felt sweaty.no loss of sense of small or taste.Has not been near anyone sick with COVID-19.wears facial mask when out in the store.No change in appetite.  CBG readings in the  100 -150.Had requested Metformin 1000 mg 24 hr tablet which had been recalled but stated her brother was currently on it and wanted to restart.Metformin was not available so request metformin 1000 mg tablet daily to be refilled.       Past Medical History:  Diagnosis Date  . Anxiety   . Bipolar 1 disorder ([place])   . Bulging of cervical intervertebral disc    C2  . Bulging of thoracic intervertebral disc    t4  . Depression   . Diabetes mellitus without  complication ([place])   . Hyperlipidemia   . PTSD (post-traumatic stress disorder)   . Sad    Past Surgical History:  Procedure Laterality Date  . CATARACT EXTRACTION Right 08/11/2017  . CATARACT EXTRACTION Left 09/01/2017  . EYE SURGERY    . HEMICOLECTOMY  1997  . JOINT REPLACEMENT  2013   hip    Allergies  Allergen Reactions  . Aspirin Other (See Comments)  . Other Other (See Comments)    "makes my urine turn brown" Other reaction(s): MUSCLE PAIN "makes my urine turn brown"  . Oxycodone     Migraines  . Statins Other (See Comments)    "makes my urine turn brown" Other reaction(s): MUSCLE PAIN "makes my urine turn brown"    Outpatient Encounter Medications as of 08/03/2020  Medication Sig  . Accu-Chek FastClix Lancets MISC Use to test blood sugar three times daily. Dx: E11.9  . ARIPiprazole ER (ABILIFY MAINTENA) 300 MG PRSY prefilled syringe Inject 300 mg into the muscle every 30 (thirty) days. Last injection 10/28/2019  . Ascorbic Acid (VITAMIN C) 1000 MG tablet Take 1,000 mg by mouth daily.  . ASTAXANTHIN PO Take 12 mg by mouth daily.   Jolyne Loa Grape-Goldenseal (BERBERINE COMPLEX PO) Take by mouth. 500 mg 3-4 times daily  . clonazePAM (KLONOPIN) 1 MG tablet 0.5 mg.  . Continuous Blood Gluc Receiver (FREESTYLE LIBRE 14 DAY READER) DEVI 1 applicator by Does not apply route daily.  . Continuous Blood Gluc Sensor (FREESTYLE LIBRE 14 DAY SENSOR) MISC 1  application by Does not apply route daily.  . Continuous Glucose Monitor Sup KIT 1 kit by Does not apply route 3 (three) times daily.  Marland Kitchen gabapentin (NEURONTIN) 300 MG capsule Take one capsule by mouth three times daily  . glucose blood (ACCU-CHEK GUIDE) test strip Use to test blood sugar three times daily. Dx: E11.9  . lamoTRIgine (LAMICTAL) 200 MG tablet Take 200 mg by mouth 2 (two) times daily.   Marland Kitchen lisinopril (ZESTRIL) 10 MG tablet TAKE 1 TABLET EVERY DAY  . loratadine (CLARITIN) 10 MG tablet Take 1 tablet (10 mg total)  by mouth daily.  . metFORMIN (GLUCOPHAGE) 1000 MG tablet Take 1 tablet (1,000 mg total) by mouth daily with breakfast.  . nystatin cream (MYCOSTATIN) Apply 1 application topically 2 (two) times daily.  . sertraline (ZOLOFT) 25 MG tablet Take 25 mg by mouth daily.  . [DISCONTINUED] metFORMIN (GLUMETZA) 1000 MG (MOD) 24 hr tablet Take 1 tablet (1,000 mg total) by mouth daily with breakfast.   No facility-administered encounter medications on file as of 08/03/2020.    Review of Systems  Constitutional: Positive for fatigue and fever. Negative for appetite change, chills and unexpected weight change.  HENT: Positive for congestion and sore throat. Negative for dental problem, ear discharge, ear pain, facial swelling, hearing loss, nosebleeds, postnasal drip, rhinorrhea, sinus pressure, sinus pain, sneezing, tinnitus and trouble swallowing.   Eyes: Negative for pain, discharge, redness, itching and visual disturbance.  Respiratory: Negative for cough, chest tightness, shortness of breath and wheezing.   Cardiovascular: Negative for chest pain, palpitations and leg swelling.  Gastrointestinal: Negative for abdominal distention, abdominal pain, blood in stool, constipation, diarrhea, nausea and vomiting.  Genitourinary: Negative for difficulty urinating, dysuria, flank pain, frequency and urgency.  Musculoskeletal: Negative for arthralgias, back pain, gait problem, joint swelling, myalgias, neck pain and neck stiffness.  Skin: Negative for color change, pallor, rash and wound.  Neurological: Negative for dizziness, syncope, speech difficulty, weakness, light-headedness, numbness and headaches.  Hematological: Does not bruise/bleed easily.  Psychiatric/Behavioral: Negative for agitation, behavioral problems, confusion and sleep disturbance.    Immunization History  Administered Date(s) Administered  . Fluad Quad(high Dose 65+) 12/21/2018, 12/02/2019  . Influenza,inj,Quad PF,6+ Mos 04/04/2016  .  Influenza-Unspecified 12/29/2013, 12/14/2017  . PFIZER(Purple Top)SARS-COV-2 Vaccination 08/18/2019, 09/12/2019  . Pneumococcal Conjugate-13 01/04/2016  . Pneumococcal Polysaccharide-23 09/30/2017  . Tdap 09/29/2015  . Zoster Recombinat (Shingrix) 12/05/2017, 06/03/2018   Pertinent  Health Maintenance Due  Topic Date Due  . INFLUENZA VACCINE  10/29/2020  . HEMOGLOBIN A1C  11/22/2020  . OPHTHALMOLOGY EXAM  04/04/2021  . FOOT EXAM  05/28/2021  . MAMMOGRAM  11/06/2021  . COLONOSCOPY (Pts 45-27yr Insurance coverage will need to be confirmed)  10/18/2028  . DEXA SCAN  Completed  . PNA vac Low Risk Adult  Completed   Fall Risk  08/03/2020 07/23/2020 05/28/2020 03/19/2020 12/02/2019  Falls in the past year? 0 0 0 0 0  Number falls in past yr: 0 0 0 0 0  Comment - - - - -  Injury with Fall? 0 0 0 0 0  Comment - - - - -  Risk for fall due to : - - - - -   Functional Status Survey:    Vitals:   08/03/20 1526  BP: 110/70  Pulse: (!) 108  Resp: 16  Temp: (!) 96.9 F (36.1 C)  SpO2: 97%  Height: '5\' 3"'  (1.6 m)   Body mass index is 33.37 kg/m. Physical Exam Vitals reviewed.  Constitutional:      General: She is not in acute distress.    Appearance: Normal appearance. She is normal weight. She is not ill-appearing or diaphoretic.  HENT:     Head: Normocephalic.     Right Ear: Tympanic membrane, ear canal and external ear normal. There is no impacted cerumen.     Left Ear: Tympanic membrane, ear canal and external ear normal. There is no impacted cerumen.     Nose: Nose normal. No congestion or rhinorrhea.     Mouth/Throat:     Mouth: Mucous membranes are moist.     Pharynx: Oropharynx is clear. No oropharyngeal exudate or posterior oropharyngeal erythema.  Eyes:     General: No scleral icterus.       Right eye: No discharge.        Left eye: No discharge.     Extraocular Movements: Extraocular movements intact.     Conjunctiva/sclera: Conjunctivae normal.     Pupils: Pupils are  equal, round, and reactive to light.  Neck:     Vascular: No carotid bruit.  Cardiovascular:     Rate and Rhythm: Normal rate and regular rhythm.     Pulses: Normal pulses.     Heart sounds: Normal heart sounds. No murmur heard. No friction rub. No gallop.   Pulmonary:     Effort: Pulmonary effort is normal. No respiratory distress.     Breath sounds: Normal breath sounds. No wheezing, rhonchi or rales.  Chest:     Chest wall: No tenderness.  Abdominal:     General: Bowel sounds are normal. There is no distension.     Palpations: Abdomen is soft. There is no mass.     Tenderness: There is no abdominal tenderness. There is no right CVA tenderness, left CVA tenderness, guarding or rebound.  Musculoskeletal:        General: No swelling or tenderness. Normal range of motion.     Cervical back: Normal range of motion. No rigidity or tenderness.     Right lower leg: No edema.     Left lower leg: No edema.  Lymphadenopathy:     Cervical: No cervical adenopathy.  Skin:    General: Skin is warm and dry.     Coloration: Skin is not pale.     Findings: No bruising, erythema, lesion or rash.  Neurological:     Mental Status: She is alert and oriented to person, place, and time.     Cranial Nerves: No cranial nerve deficit.     Motor: No weakness.     Gait: Gait normal.  Psychiatric:        Mood and Affect: Mood normal.        Speech: Speech normal.        Behavior: Behavior normal.        Thought Content: Thought content normal.        Judgment: Judgment normal.     Labs reviewed: Recent Labs    10/24/19 0931 05/25/20 0834  NA 136 137  K 4.4 4.1  CL 102 102  CO2 19* 23  GLUCOSE 133* 161*  BUN 29* 21  CREATININE 1.01* 0.97*  CALCIUM 9.3 9.4   Recent Labs    10/24/19 0931 05/25/20 0834  AST 15 15  ALT 15 15  BILITOT 0.5 0.4  PROT 6.8 6.6   Recent Labs    10/24/19 0931 05/25/20 0834  WBC 5.2 4.5  NEUTROABS 3,656 3,123  HGB 13.3 14.4  HCT  40.5 43.1  MCV 90.6  89.6  PLT 224 223   Lab Results  Component Value Date   TSH 2.55 05/25/2020   Lab Results  Component Value Date   HGBA1C 6.4 (H) 05/25/2020   Lab Results  Component Value Date   CHOL 253 (H) 05/25/2020   HDL 52 05/25/2020   LDLCALC 163 (H) 05/25/2020   TRIG 223 (H) 05/25/2020   CHOLHDL 4.9 05/25/2020    Significant Diagnostic Results in last 30 days:  No results found.  Assessment/Plan  1. Type 2 diabetes mellitus with other specified complication, without long-term current use of insulin (HCC) Lab Results  Component Value Date   HGBA1C 6.4 (H) 05/25/2020  CBG well controlled.Will D/c metformin 1000 mg 24 hr tablet then restart Metformin 1000 mg tablet daily as below. - metFORMIN (GLUCOPHAGE) 1000 MG tablet; Take 1 tablet (1,000 mg total) by mouth daily with breakfast.  Dispense: 180 tablet; Refill: 3  2. Symptoms of upper respiratory infection (URI) Afebrile. - POC Influenza A/B results negative. - No available strep throat test in the office. - SARS-COV-2 RNA,(COVID-19) QUAL NAAT  - made aware SARS-COV-2 results will return in 3 days.  - Negative exam findings.suspect URI's. - encouraged to gurgle throat with warm water and salt at least twice daily  -continue on vitamin C supplement - encouraged to increase fluid intake rest  Family/ staff Communication: Reviewed plan of care with patient verbalized understanding   Labs/tests ordered:  - POC Influenza A/B results negative. - SARS-COV-2 RNA,(COVID-19) QUAL NAAT   Next Appointment: As needed if symptoms worsen or fail to improve    Sandrea Hughs, NP

## 2020-08-03 NOTE — Patient Instructions (Signed)
-   please gurgle throat with warm with salt at least once a day x 3 days for sore throat.

## 2020-08-04 LAB — SARS-COV-2 RNA,(COVID-19) QUALITATIVE NAAT: SARS CoV2 RNA: NOT DETECTED

## 2020-08-06 DIAGNOSIS — F3181 Bipolar II disorder: Secondary | ICD-10-CM | POA: Diagnosis not present

## 2020-08-22 DIAGNOSIS — F419 Anxiety disorder, unspecified: Secondary | ICD-10-CM | POA: Diagnosis not present

## 2020-08-22 DIAGNOSIS — M5021 Other cervical disc displacement,  high cervical region: Secondary | ICD-10-CM | POA: Diagnosis not present

## 2020-08-22 DIAGNOSIS — M5124 Other intervertebral disc displacement, thoracic region: Secondary | ICD-10-CM | POA: Diagnosis not present

## 2020-08-22 DIAGNOSIS — F310 Bipolar disorder, current episode hypomanic: Secondary | ICD-10-CM | POA: Diagnosis not present

## 2020-08-22 DIAGNOSIS — I10 Essential (primary) hypertension: Secondary | ICD-10-CM | POA: Diagnosis not present

## 2020-08-22 DIAGNOSIS — E119 Type 2 diabetes mellitus without complications: Secondary | ICD-10-CM | POA: Diagnosis not present

## 2020-08-22 DIAGNOSIS — G8929 Other chronic pain: Secondary | ICD-10-CM | POA: Diagnosis not present

## 2020-08-22 DIAGNOSIS — F4310 Post-traumatic stress disorder, unspecified: Secondary | ICD-10-CM | POA: Diagnosis not present

## 2020-08-22 DIAGNOSIS — G4700 Insomnia, unspecified: Secondary | ICD-10-CM | POA: Diagnosis not present

## 2020-09-01 ENCOUNTER — Other Ambulatory Visit: Payer: Self-pay | Admitting: Family

## 2020-09-01 DIAGNOSIS — I10 Essential (primary) hypertension: Secondary | ICD-10-CM

## 2020-09-01 DIAGNOSIS — E1169 Type 2 diabetes mellitus with other specified complication: Secondary | ICD-10-CM

## 2020-09-05 DIAGNOSIS — I739 Peripheral vascular disease, unspecified: Secondary | ICD-10-CM | POA: Diagnosis not present

## 2020-09-05 DIAGNOSIS — F310 Bipolar disorder, current episode hypomanic: Secondary | ICD-10-CM | POA: Diagnosis not present

## 2020-09-05 DIAGNOSIS — M792 Neuralgia and neuritis, unspecified: Secondary | ICD-10-CM | POA: Diagnosis not present

## 2020-09-05 DIAGNOSIS — F4310 Post-traumatic stress disorder, unspecified: Secondary | ICD-10-CM | POA: Diagnosis not present

## 2020-09-05 DIAGNOSIS — I10 Essential (primary) hypertension: Secondary | ICD-10-CM | POA: Diagnosis not present

## 2020-09-05 DIAGNOSIS — B351 Tinea unguium: Secondary | ICD-10-CM | POA: Diagnosis not present

## 2020-09-05 DIAGNOSIS — L603 Nail dystrophy: Secondary | ICD-10-CM | POA: Diagnosis not present

## 2020-09-05 DIAGNOSIS — F419 Anxiety disorder, unspecified: Secondary | ICD-10-CM | POA: Diagnosis not present

## 2020-09-05 DIAGNOSIS — M5021 Other cervical disc displacement,  high cervical region: Secondary | ICD-10-CM | POA: Diagnosis not present

## 2020-09-05 DIAGNOSIS — L84 Corns and callosities: Secondary | ICD-10-CM | POA: Diagnosis not present

## 2020-09-05 DIAGNOSIS — G8929 Other chronic pain: Secondary | ICD-10-CM | POA: Diagnosis not present

## 2020-09-05 DIAGNOSIS — M5124 Other intervertebral disc displacement, thoracic region: Secondary | ICD-10-CM | POA: Diagnosis not present

## 2020-09-05 DIAGNOSIS — G4700 Insomnia, unspecified: Secondary | ICD-10-CM | POA: Diagnosis not present

## 2020-09-05 DIAGNOSIS — E119 Type 2 diabetes mellitus without complications: Secondary | ICD-10-CM | POA: Diagnosis not present

## 2020-09-05 DIAGNOSIS — I70219 Atherosclerosis of native arteries of extremities with intermittent claudication, unspecified extremity: Secondary | ICD-10-CM | POA: Diagnosis not present

## 2020-09-05 DIAGNOSIS — E1351 Other specified diabetes mellitus with diabetic peripheral angiopathy without gangrene: Secondary | ICD-10-CM | POA: Diagnosis not present

## 2020-09-21 ENCOUNTER — Ambulatory Visit (INDEPENDENT_AMBULATORY_CARE_PROVIDER_SITE_OTHER): Payer: Medicare HMO | Admitting: Family

## 2020-09-21 ENCOUNTER — Encounter: Payer: Self-pay | Admitting: Family

## 2020-09-21 ENCOUNTER — Other Ambulatory Visit: Payer: Self-pay

## 2020-09-21 VITALS — BP 130/88 | HR 96 | Temp 97.1°F | Resp 16 | Ht 63.0 in | Wt 190.6 lb

## 2020-09-21 DIAGNOSIS — F4310 Post-traumatic stress disorder, unspecified: Secondary | ICD-10-CM | POA: Diagnosis not present

## 2020-09-21 DIAGNOSIS — M5124 Other intervertebral disc displacement, thoracic region: Secondary | ICD-10-CM | POA: Diagnosis not present

## 2020-09-21 DIAGNOSIS — M5021 Other cervical disc displacement,  high cervical region: Secondary | ICD-10-CM | POA: Diagnosis not present

## 2020-09-21 DIAGNOSIS — L237 Allergic contact dermatitis due to plants, except food: Secondary | ICD-10-CM

## 2020-09-21 DIAGNOSIS — F310 Bipolar disorder, current episode hypomanic: Secondary | ICD-10-CM | POA: Diagnosis not present

## 2020-09-21 DIAGNOSIS — E119 Type 2 diabetes mellitus without complications: Secondary | ICD-10-CM | POA: Diagnosis not present

## 2020-09-21 DIAGNOSIS — I10 Essential (primary) hypertension: Secondary | ICD-10-CM | POA: Diagnosis not present

## 2020-09-21 DIAGNOSIS — G8929 Other chronic pain: Secondary | ICD-10-CM | POA: Diagnosis not present

## 2020-09-21 DIAGNOSIS — G4700 Insomnia, unspecified: Secondary | ICD-10-CM | POA: Diagnosis not present

## 2020-09-21 DIAGNOSIS — F419 Anxiety disorder, unspecified: Secondary | ICD-10-CM | POA: Diagnosis not present

## 2020-09-21 MED ORDER — PREDNISONE 10 MG PO TABS: ORAL_TABLET | ORAL | 0 refills | Status: AC

## 2020-09-21 NOTE — Patient Instructions (Signed)
Poison Ivy Dermatitis ?Poison ivy dermatitis is inflammation of the skin that is caused by chemicals in the leaves of the poison ivy plant. The skin reaction often involves redness, swelling, blisters, and extreme itching. ?What are the causes? ?This condition is caused by a chemical (urushiol) found in the sap of the poison ivy plant. This chemical is sticky and can be easily spread to people, animals, and objects. You can get poison ivy dermatitis by: ?Having direct contact with a poison ivy plant. ?Touching animals, other people, or objects that have come in contact with poison ivy and have the chemical on them. ?What increases the risk? ?This condition is more likely to develop in people who: ?Are outdoors often in wooded or marshy areas. ?Go outdoors without wearing protective clothing, such as closed shoes, long pants, and a long-sleeved shirt. ?What are the signs or symptoms? ?Symptoms of this condition include: ?Redness of the skin. ?Extreme itching. ?A rash that often includes bumps and blisters. The rash usually appears 48 hours after exposure, if you have been exposed before. If this is the first time you have been exposed, the rash may not appear until a week after exposure. ?Swelling. This may occur if the reaction is more severe. ?Symptoms usually last for 1-2 weeks. However, the first time you develop this condition, symptoms may last 3-4 weeks. ?How is this diagnosed? ?This condition may be diagnosed based on your symptoms and a physical exam. Your health care provider may also ask you about any recent outdoor activity. ?How is this treated? ?Treatment for this condition will vary depending on how severe it is. Treatment may include: ?Hydrocortisone cream or calamine lotion to relieve itching. ?Oatmeal baths to soothe the skin. ?Medicines, such as over-the-counter antihistamine tablets. ?Oral steroid medicine, for more severe reactions. ?Follow these instructions at home: ?Medicines ?Take or apply  over-the-counter and prescription medicines only as told by your health care provider. ?Use hydrocortisone cream or calamine lotion as needed to soothe the skin and relieve itching. ?General instructions ?Do not scratch or rub your skin. ?Apply a cold, wet cloth (cold compress) to the affected areas or take baths in cool water. This will help with itching. Avoid hot baths and showers. ?Take oatmeal baths as needed. Use colloidal oatmeal. You can get this at your local pharmacy or grocery store. Follow the instructions on the packaging. ?While you have the rash, wash clothes right after you wear them. ?Keep all follow-up visits as told by your health care provider. This is important. ?How is this prevented? ? ?Learn to identify the poison ivy plant and avoid contact with the plant. This plant can be recognized by the number of leaves. Generally, poison ivy has three leaves with flowering branches on a single stem. The leaves are typically glossy, and they have jagged edges that come to a point at the front. ?If you have been exposed to poison ivy, thoroughly wash with soap and water right away. You have about 30 minutes to remove the plant resin before it will cause the rash. Be sure to wash under your fingernails, because any plant resin there will continue to spread the rash. ?When hiking or camping, wear clothes that will help you to avoid exposure on the skin. This includes long pants, a long-sleeved shirt, tall socks, and hiking boots. You can also apply preventive lotion to your skin to help limit exposure. ?If you suspect that your clothes or outdoor gear came in contact with poison ivy, rinse them off outside   with a garden hose before you bring them inside your house. ?When doing yard work or gardening, wear gloves, long sleeves, long pants, and boots. Wash your garden tools and gloves if they come in contact with poison ivy. ?If you suspect that your pet has come into contact with poison ivy, wash him or her  with pet shampoo and water. Make sure to wear gloves while washing your pet. ?Contact a health care provider if you have: ?Open sores in the rash area. ?More redness, swelling, or pain in the affected area. ?Redness that spreads beyond the rash area. ?Fluid, blood, or pus coming from the affected area. ?A fever. ?A rash over a large area of your body. ?A rash on your eyes, mouth, or genitals. ?A rash that does not improve after a few weeks. ?Get help right away if: ?Your face swells or your eyes swell shut. ?You have trouble breathing. ?You have trouble swallowing. ?These symptoms may represent a serious problem that is an emergency. Do not wait to see if the symptoms will go away. Get medical help right away. Call your local emergency services (911 in the [place] Do not drive yourself to the hospital. ?Summary ?Poison ivy dermatitis is inflammation of the skin that is caused by chemicals in the leaves of the poison ivy plant. ?Symptoms of this condition include redness, itching, a rash, and swelling. ?Do not scratch or rub your skin. ?Take or apply over-the-counter and prescription medicines only as told by your health care provider. ?This information is not intended to replace advice given to you by your health care provider. Make sure you discuss any questions you have with your health care provider. ?Document Revised: 07/09/2018 Document Reviewed: 03/12/2018 ?Elsevier Patient Education ? 2022 [employer] ? ?

## 2020-09-21 NOTE — Progress Notes (Signed)
Provider: Marlowe Sax FNP-C  Ira Dougher, [name], NP  Patient Care Team: Keaghan Bowens, [name], NP as PCP - General (Family Medicine) Brien Few, MD as Consulting Physician (Obstetrics and Gynecology) Katy Apo, MD as Consulting Physician (Ophthalmology)  Extended Emergency Contact Information Primary Emergency Contact: [place], [place] Phone: [phone] Relation: Son  Code Status:  Full Code  Goals of care: Advanced Directive information Advanced Directives 09/21/2020  Does Patient Have a Medical Advance Directive? Yes  Type of Advance Directive [place]  Does patient want to make changes to medical advance directive? No - Patient declined  Copy of [place] in Chart? Yes - validated most recent copy scanned in chart (See row information)  Would patient like information on creating a medical advance directive? -     Chief Complaint  Patient presents with   Acute Visit    Complains of poison ivy.    HPI:  Pt is a 70 y.o. female seen today for an acute visit for evaluation of rash.States was pulling weeds when she got in contact with with poison Ivy on 09/12/2020. Rash spread on the arms and all over.  Has had no fever or chills.    Past Medical History:  Diagnosis Date   Anxiety    Bipolar 1 disorder ([place])    Bulging of cervical intervertebral disc    C2   Bulging of thoracic intervertebral disc    t4   Depression    Diabetes mellitus without complication ([place])    Hyperlipidemia    PTSD (post-traumatic stress disorder)    Sad    Past Surgical History:  Procedure Laterality Date   CATARACT EXTRACTION Right 08/11/2017   CATARACT EXTRACTION Left 09/01/2017   [place]   JOINT REPLACEMENT  2013   hip    Allergies  Allergen Reactions   Aspirin Other (See Comments)   Other Other (See Comments)    "makes my urine turn  brown" Other reaction(s): MUSCLE PAIN "makes my urine turn brown"   Oxycodone     Migraines   Statins Other (See Comments)    "makes my urine turn brown" Other reaction(s): MUSCLE PAIN "makes my urine turn brown"    Outpatient Encounter Medications as of 09/21/2020  Medication Sig   Accu-Chek FastClix Lancets MISC Use to test blood sugar three times daily. Dx: E11.9   ARIPiprazole ER (ABILIFY MAINTENA) 300 MG PRSY prefilled syringe Inject 300 mg into the muscle every 30 (thirty) days. Last injection 10/28/2019   Ascorbic Acid (VITAMIN C) 1000 MG tablet Take 1,000 mg by mouth daily.   ASTAXANTHIN PO Take 12 mg by mouth daily.    Barberry-Oreg Grape-Goldenseal (BERBERINE COMPLEX PO) Take by mouth. 500 mg 3-4 times daily   clonazePAM (KLONOPIN) 1 MG tablet 0.5 mg.   Continuous Blood Gluc Receiver (FREESTYLE LIBRE 14 DAY READER) DEVI 1 applicator by Does not apply route daily.   Continuous Glucose Monitor Sup KIT 1 kit by Does not apply route 3 (three) times daily.   gabapentin (NEURONTIN) 300 MG capsule Take one capsule by mouth three times daily   glucose blood (ACCU-CHEK GUIDE) test strip Use to test blood sugar three times daily. Dx: E11.9   lamoTRIgine (LAMICTAL) 200 MG tablet Take 200 mg by mouth 2 (two) times daily.    lisinopril (ZESTRIL) 10 MG tablet TAKE 1  TABLET(10 MG) BY MOUTH DAILY   loratadine (CLARITIN) 10 MG tablet Take 1 tablet (10 mg total) by mouth daily.   metFORMIN (GLUCOPHAGE) 1000 MG tablet Take 1 tablet (1,000 mg total) by mouth daily with breakfast.   nystatin cream (MYCOSTATIN) Apply 1 application topically 2 (two) times daily.   sertraline (ZOLOFT) 25 MG tablet Take 25 mg by mouth daily.   No facility-administered encounter medications on file as of 09/21/2020.    Review of Systems  Constitutional:  Negative for appetite change, chills, fatigue and fever.  Respiratory:  Negative for cough, chest tightness, shortness of breath and wheezing.   Cardiovascular:   Negative for chest pain, palpitations and leg swelling.  Gastrointestinal:  Negative for abdominal distention, abdominal pain, diarrhea, nausea and vomiting.  Skin:  Positive for rash. Negative for color change and pallor.  Neurological:  Negative for dizziness, weakness, light-headedness and headaches.   Immunization History  Administered Date(s) Administered   Fluad Quad(high Dose 65+) 12/21/2018, 12/02/2019   Influenza,inj,Quad PF,6+ Mos 04/04/2016   Influenza-Unspecified 12/29/2013, 12/14/2017   PFIZER(Purple Top)SARS-COV-2 Vaccination 08/18/2019, 09/12/2019   Pneumococcal Conjugate-13 01/04/2016   Pneumococcal Polysaccharide-23 09/30/2017   Tdap 09/29/2015   Zoster Recombinat (Shingrix) 12/05/2017, 06/03/2018   Pertinent  Health Maintenance Due  Topic Date Due   INFLUENZA VACCINE  10/29/2020   HEMOGLOBIN A1C  11/22/2020   OPHTHALMOLOGY EXAM  04/04/2021   FOOT EXAM  05/28/2021   MAMMOGRAM  11/06/2021   COLONOSCOPY (Pts 45-68yr Insurance coverage will need to be confirmed)  10/18/2028   DEXA SCAN  Completed   PNA vac Low Risk Adult  Completed   Fall Risk  09/21/2020 08/03/2020 07/23/2020 05/28/2020 03/19/2020  Falls in the past year? 0 0 0 0 0  Number falls in past yr: 0 0 0 0 0  Comment - - - - -  Injury with Fall? 0 0 0 0 0  Comment - - - - -  Risk for fall due to : No Fall Risks - - - -   Functional Status Survey:    Vitals:   09/21/20 1531  BP: 130/88  Pulse: 96  Resp: 16  Temp: (!) 97.1 F (36.2 C)  SpO2: 95%  Weight: 190 lb 9.6 oz (86.5 kg)  Height: '5\' 3"'  (1.6 m)   Body mass index is 33.76 kg/m. Physical Exam Constitutional:      General: She is not in acute distress.    Appearance: She is obese. She is not ill-appearing.  HENT:     Head: Normocephalic.     Mouth/Throat:     Mouth: Mucous membranes are moist.  Eyes:     General: No scleral icterus.       Right eye: No discharge.        Left eye: No discharge.     Extraocular Movements: Extraocular  movements intact.     Conjunctiva/sclera: Conjunctivae normal.     Pupils: Pupils are equal, round, and reactive to light.  Neck:     Vascular: No carotid bruit.  Cardiovascular:     Rate and Rhythm: Normal rate and regular rhythm.     Pulses: Normal pulses.     Heart sounds: Normal heart sounds. No murmur heard.   No friction rub. No gallop.  Pulmonary:     Effort: Pulmonary effort is normal. No respiratory distress.     Breath sounds: Normal breath sounds. No wheezing, rhonchi or rales.  Chest:     Chest wall: No tenderness.  Abdominal:  General: Bowel sounds are normal. There is no distension.     Palpations: Abdomen is soft. There is no mass.     Tenderness: There is no abdominal tenderness. There is no guarding or rebound.  Musculoskeletal:        General: No swelling or tenderness. Normal range of motion.     Cervical back: Normal range of motion. No rigidity or tenderness.     Right lower leg: No edema.     Left lower leg: No edema.  Lymphadenopathy:     Cervical: No cervical adenopathy.  Skin:    General: Skin is warm and dry.     Coloration: Skin is not pale.     Findings: Rash present. No bruising or erythema.     Comments: Bilateral upper and lower extremities scattered red non-raised rash   Neurological:     Mental Status: She is alert and oriented to person, place, and time.     Cranial Nerves: No cranial nerve deficit.     Sensory: No sensory deficit.     Motor: No weakness.     Coordination: Coordination normal.     Gait: Gait normal.    Labs reviewed: Recent Labs    10/24/19 0931 05/25/20 0834  NA 136 137  K 4.4 4.1  CL 102 102  CO2 19* 23  GLUCOSE 133* 161*  BUN 29* 21  CREATININE 1.01* 0.97*  CALCIUM 9.3 9.4   Recent Labs    10/24/19 0931 05/25/20 0834  AST 15 15  ALT 15 15  BILITOT 0.5 0.4  PROT 6.8 6.6   Recent Labs    10/24/19 0931 05/25/20 0834  WBC 5.2 4.5  NEUTROABS 3,656 3,123  HGB 13.3 14.4  HCT 40.5 43.1  MCV 90.6  89.6  PLT 224 223   Lab Results  Component Value Date   TSH 2.55 05/25/2020   Lab Results  Component Value Date   HGBA1C 6.4 (H) 05/25/2020   Lab Results  Component Value Date   CHOL 253 (H) 05/25/2020   HDL 52 05/25/2020   LDLCALC 163 (H) 05/25/2020   TRIG 223 (H) 05/25/2020   CHOLHDL 4.9 05/25/2020    Significant Diagnostic Results in last 30 days:  No results found.  Assessment/Plan  Contact dermatitis due to poison ivy Afebrile.reports itchy rash after contact with poison ivy  Bilateral upper and lower extremities scattered red non-raised rash  - continue with hydrocortisone cream - start on taper prednisone as below.side effects discussed.she will take  her metformin twice daily while on prednisone and notify provider if CBG> 200 -Notify provider if symptoms worsen or fail to improve   - predniSONE (DELTASONE) 10 MG tablet; Take 4 tablets (40 mg total) by mouth daily with breakfast for 1 day, THEN 3 tablets (30 mg total) daily with breakfast for 1 day, THEN 2 tablets (20 mg total) daily with breakfast for 1 day, THEN 1 tablet (10 mg total) daily with breakfast for 1 day, THEN 0.5 tablets (5 mg total) daily with breakfast for 1 day.  Dispense: 10.5 tablet; Refill: 0   Family/ staff Communication: Reviewed plan of care with patient verbalized understanding   Labs/tests ordered: None   Next Appointment: As needed if symptoms worsen or fail to improve    Sandrea Hughs, NP

## 2020-09-24 ENCOUNTER — Other Ambulatory Visit: Payer: Self-pay | Admitting: Family

## 2020-09-26 DIAGNOSIS — S62325D Displaced fracture of shaft of fourth metacarpal bone, left hand, subsequent encounter for fracture with routine healing: Secondary | ICD-10-CM | POA: Diagnosis not present

## 2020-10-02 ENCOUNTER — Other Ambulatory Visit: Payer: Self-pay

## 2020-10-02 MED ORDER — FREESTYLE LIBRE 2 READER DEVI: 1.0000 | Freq: Every day | 11 refills | Status: DC

## 2020-10-02 NOTE — Addendum Note (Signed)
Addended by: Logan Bores on: 10/02/2020 12:11 PM   Modules accepted: Orders

## 2020-10-03 ENCOUNTER — Other Ambulatory Visit: Payer: Self-pay | Admitting: *Deleted

## 2020-10-03 MED ORDER — FREESTYLE LIBRE 2 SENSOR MISC: 5 refills | Status: DC

## 2020-10-03 NOTE — Telephone Encounter (Signed)
Patient called requesting the [facility] 2 sensor.

## 2020-10-23 ENCOUNTER — Encounter: Payer: Self-pay | Admitting: Family

## 2020-10-23 ENCOUNTER — Other Ambulatory Visit: Payer: Self-pay

## 2020-10-23 ENCOUNTER — Ambulatory Visit (INDEPENDENT_AMBULATORY_CARE_PROVIDER_SITE_OTHER): Payer: Medicare HMO | Admitting: Family

## 2020-10-23 DIAGNOSIS — H9193 Unspecified hearing loss, bilateral: Secondary | ICD-10-CM

## 2020-10-23 DIAGNOSIS — M8589 Other specified disorders of bone density and structure, multiple sites: Secondary | ICD-10-CM | POA: Diagnosis not present

## 2020-10-23 DIAGNOSIS — Z0000 Encounter for general adult medical examination without abnormal findings: Secondary | ICD-10-CM

## 2020-10-23 NOTE — Progress Notes (Signed)
This service is provided via telemedicine  No vital signs collected/recorded due to the encounter was a telemedicine visit.   Location of patient (ex: home, work):  Home  Patient consents to a telephone visit: Yes, see telephone visit dated 03/19/2020  Location of the provider (ex: office, home):  [facility], Office   Name of any referring provider:  N/A  Names of all persons participating in the telemedicine service and their role in the encounter:  S.Chrae B/CMA, Kristinia Leavy, NP, and Patient   Time spent on call:  9 min with medical assistant     Subjective:   Kaitlyn Wood is a 70 y.o. female who presents for Medicare Annual (Subsequent) preventive examination.  Review of Systems     Cardiac Risk Factors include: advanced age (>61mn, >>66women);diabetes mellitus;dyslipidemia;obesity (BMI >30kg/m2);hypertension     Objective:    There were no vitals filed for this visit. There is no height or weight on file to calculate BMI.  Advanced Directives 10/23/2020 09/21/2020 08/03/2020 07/23/2020 03/19/2020 12/02/2019 11/11/2019  Does Patient Have a Medical Advance Directive? Yes Yes Yes Yes Yes Yes Yes  Type of AParamedicof AG[place]Living will Healthcare Power of [place]Living will HBartleyLiving will HNavajo DamLiving will HCadwellLiving will HCentereachLiving will;Out of facility DNR (pink MOST or yellow form)  Does patient want to make changes to medical advance directive? No - Patient declined No - Patient declined No - Patient declined No - Patient declined No - Patient declined No - Patient declined No - Patient declined  Copy of H[place]in Chart? Yes - validated most recent copy scanned in chart (See row information) Yes - validated most recent copy scanned in chart (See row information) Yes -  validated most recent copy scanned in chart (See row information) Yes - validated most recent copy scanned in chart (See row information) Yes - validated most recent copy scanned in chart (See row information) Yes - validated most recent copy scanned in chart (See row information) Yes - validated most recent copy scanned in chart (See row information)  Would patient like information on creating a medical advance directive? - - - - - - -    Current Medications (verified) Outpatient Encounter Medications as of 10/23/2020  Medication Sig   Accu-Chek FastClix Lancets MISC Use to test blood sugar three times daily. Dx: E11.9   ARIPiprazole ER (ABILIFY MAINTENA) 300 MG PRSY prefilled syringe Inject 300 mg into the muscle every 30 (thirty) days. Last injection 10/28/2019   Ascorbic Acid (VITAMIN C) 1000 MG tablet Take 1,000 mg by mouth daily.   ASTAXANTHIN PO Take 12 mg by mouth daily.    Barberry-Oreg Grape-Goldenseal (BERBERINE COMPLEX PO) Take by mouth. 500 mg 3-4 times daily   clonazePAM (KLONOPIN) 1 MG tablet 0.5 mg.   Continuous Blood Gluc Receiver (FREESTYLE LIBRE 2 READER) DEVI 1 Device by Does not apply route daily. E11.9   Continuous Blood Gluc Sensor (FREESTYLE LIBRE 2 SENSOR) MISC Use to test blood sugar three times daily. Dx: E11.9   Continuous Glucose Monitor Sup KIT 1 kit by Does not apply route 3 (three) times daily.   gabapentin (NEURONTIN) 300 MG capsule Take one capsule by mouth three times daily   lamoTRIgine (LAMICTAL) 200 MG tablet Take 200 mg by mouth 2 (two) times daily.    lisinopril (ZESTRIL) 10 MG tablet TAKE  1 TABLET(10 MG) BY MOUTH DAILY   loratadine (CLARITIN) 10 MG tablet Take 1 tablet (10 mg total) by mouth daily.   metFORMIN (GLUCOPHAGE) 1000 MG tablet Take 1 tablet (1,000 mg total) by mouth daily with breakfast.   nystatin cream (MYCOSTATIN) Apply 1 application topically 2 (two) times daily.   sertraline (ZOLOFT) 25 MG tablet Take 25 mg by mouth daily.   No  facility-administered encounter medications on file as of 10/23/2020.    Allergies (verified) Aspirin, Other, Oxycodone, and Statins   History: Past Medical History:  Diagnosis Date   Anxiety    Bipolar 1 disorder (HCC)    Bulging of cervical intervertebral disc    C2   Bulging of thoracic intervertebral disc    t4   Depression    Diabetes mellitus without complication (HCC)    Hyperlipidemia    PTSD (post-traumatic stress disorder)    Sad    Past Surgical History:  Procedure Laterality Date   CATARACT EXTRACTION Right 08/11/2017   CATARACT EXTRACTION Left 09/01/2017   [facility]   JOINT REPLACEMENT  2013   hip   Family History  Problem Relation Age of Onset   Ovarian cancer Mother    Lymphoma Father    Colon cancer Sister    Social History   Socioeconomic History   Marital status: Divorced    Spouse name: Not on file   Number of children: Not on file   Years of education: Not on file   Highest education level: Not on file  Occupational History   Not on file  Tobacco Use   Smoking status: Never   Smokeless tobacco: Never  Vaping Use   Vaping Use: Never used  Substance and Sexual Activity   Alcohol use: No   Drug use: No   Sexual activity: Not Currently  Other Topics Concern   Not on file  Social History Narrative   Diet? ? Fresh food- very little prepared food- no fast food.       Do you drink/eat things with caffeine?  Yes      Marital status?         Divorced                           What year were you married? 1978      Do you live in a house, apartment, assisted living, condo, trailer, etc.? apartment      Is it one or more stories? Yes      How many persons live in your home? 1      Do you have any pets in your home? (please list)  1 cat, Polly      Current or past profession: Air traffic controller, sales       Do you exercise?       yes                               Type & how often? 2-3 times a week      Do  you have a living will? No      Do you have a DNR form?    no                              If not, do you want to  discuss one? yes      Do you have signed POA/HPOA for forms? no   Social Determinants of Radio broadcast assistant Strain: Not on file  Food Insecurity: Not on file  Transportation Needs: Not on file  Physical Activity: Not on file  Stress: Not on file  Social Connections: Not on file    Tobacco Counseling Counseling given: Not Answered   Clinical Intake:  Pre-visit preparation completed: No  Pain : No/denies pain     BMI - recorded: 33.77 Nutritional Status: BMI > 30  Obese Nutritional Risks: None Diabetes: Yes CBG done?: Yes (136 checked at home) CBG resulted in Enter/ Edit results?: No Did pt. bring in CBG monitor from home?: No  How often do you need to have someone help you when you read instructions, pamphlets, or other written materials from your doctor or pharmacy?: 1 - Never What is the last grade level you completed in school?: Master Degree  Diabetic?Yes   Interpreter Needed?: No  Information entered by :: Dianh Kimerly Rowand,FNP-C   Activities of Daily Living In your present state of health, do you have any difficulty performing the following activities: 10/23/2020  Hearing? Y  Comment decreased hearing  Vision? N  Difficulty concentrating or making decisions? N  Walking or climbing stairs? N  Dressing or bathing? N  Doing errands, shopping? N  Preparing Food and eating ? N  Using the Toilet? N  In the past six months, have you accidently leaked urine? Y  Do you have problems with loss of bowel control? N  Managing your Medications? N  Managing your Finances? N  Housekeeping or managing your Housekeeping? N  Some recent data might be hidden    Patient Care Team: Lainie Daubert, [name], NP as PCP - General (Family Medicine) Brien Few, MD as Consulting Physician (Obstetrics and Gynecology) Katy Apo, MD as Consulting Physician  (Ophthalmology)  Indicate any recent Medical Services you may have received from other than [facility] providers in the past year (date may be approximate).     Assessment:   This is a routine wellness examination for Kaitlyn Wood.  Hearing/Vision screen Hearing Screening - Comments:: Patients family has stated that she may have some hearing loss, patient would like to be evaluated  Vision Screening - Comments:: Last eye exam less than 12 months ago   Dietary issues and exercise activities discussed: Current Exercise Habits: Home exercise routine, Type of exercise: Other - see comments (water aerobics), Time (Minutes): 45, Frequency (Times/Week): 1, Weekly Exercise (Minutes/Week): 45, Intensity: Intense, Exercise limited by: None identified   Goals Addressed             This Visit's Progress    Exercise 150 min/wk Moderate Activity       Exercise at least 3 times per week for 45 minutes        Depression Screen PHQ 2/9 Scores 10/23/2020 10/18/2019 04/25/2019 10/06/2018 02/18/2018 09/30/2017 01/06/2017  PHQ - 2 Score 0 0 0 - 0 6 6  PHQ- 9 Score - - - - - 15 18  Exception Documentation Other- indicate reason in comment box - - Other- indicate reason in comment box - - -  Not completed - - - Under the care of specialist - - -    Fall Risk Fall Risk  10/23/2020 09/21/2020 08/03/2020 07/23/2020 05/28/2020  Falls in the past year? 1 0 0 0 0  Number falls in past yr: 1 0 0 0 0  Comment - - - - -  Injury with Fall? 0 0 0 0 0  Comment - - - - -  Risk for fall due to : History of fall(s) No Fall Risks - - -  Follow up Falls evaluation completed - - - -    FALL RISK PREVENTION PERTAINING TO THE HOME:  Any stairs in or around the home? No  If so, are there any without handrails? No  Home free of loose throw rugs in walkways, pet beds, electrical cords, etc? Yes  Adequate lighting in your home to reduce risk of falls? Yes   ASSISTIVE DEVICES UTILIZED TO PREVENT FALLS:  Life alert? No  Use of a cane,  walker or w/c? No  Grab bars in the bathroom? Yes  Shower chair or bench in shower? Yes  Elevated toilet seat or a handicapped toilet? No   TIMED UP AND GO:  Was the test performed? No .  Length of time to ambulate 10 feet: N/A sec.   Gait slow and steady without use of assistive device  Cognitive Function: MMSE - Mini Mental State Exam 10/06/2018 09/30/2017 04/04/2016  Orientation to time '5 5 5  ' Orientation to Place '5 5 5  ' Registration '3 3 3  ' Attention/ Calculation '5 4 5  ' Recall '1 2 2  ' Language- name 2 objects '2 2 2  ' Language- repeat '1 1 1  ' Language- follow 3 step command '3 3 3  ' Language- read & follow direction '1 1 1  ' Write a sentence '1 1 1  ' Copy design 1 0 1  Total score '28 27 29     ' 6CIT Screen 10/23/2020 10/18/2019  What Year? 0 points 0 points  What month? 0 points 0 points  What time? 0 points 0 points  Count back from 20 0 points 0 points  Months in reverse 0 points 2 points  Repeat phrase 0 points 2 points  Total Score 0 4    Immunizations Immunization History  Administered Date(s) Administered   Fluad Quad(high Dose 65+) 12/21/2018, 12/02/2019   Influenza,inj,Quad PF,6+ Mos 04/04/2016   Influenza-Unspecified 12/29/2013, 12/14/2017   PFIZER(Purple Top)SARS-COV-2 Vaccination 08/18/2019, 09/12/2019   Pneumococcal Conjugate-13 01/04/2016   Pneumococcal Polysaccharide-23 09/30/2017   Tdap 09/29/2015   Zoster Recombinat (Shingrix) 12/05/2017, 06/03/2018    TDAP status: Up to date  Flu Vaccine status: Up to date  Pneumococcal vaccine status: Up to date  Covid-19 vaccine status: Information provided on how to obtain vaccines.   Qualifies for Shingles Vaccine? Yes   Zostavax completed Yes   Shingrix Completed?: Yes  Screening Tests Health Maintenance  Topic Date Due   COVID-19 Vaccine (3 - Pfizer risk series) 10/10/2019   INFLUENZA VACCINE  10/29/2020   HEMOGLOBIN A1C  11/22/2020   OPHTHALMOLOGY EXAM  04/04/2021   FOOT EXAM  05/28/2021   MAMMOGRAM   11/06/2021   TETANUS/TDAP  09/28/2025   COLONOSCOPY (Pts 45-59yr Insurance coverage will need to be confirmed)  10/18/2028   DEXA SCAN  Completed   Hepatitis C Screening  Completed   PNA vac Low Risk Adult  Completed   Zoster Vaccines- Shingrix  Completed   HPV VACCINES  Aged Out    Health Maintenance  Health Maintenance Due  Topic Date Due   COVID-19 Vaccine (3 - Pfizer risk series) 10/10/2019    Colorectal cancer screening: Type of screening: Cologuard. Completed 10/19/2018. Repeat every 10 years  Mammogram status: Completed 11/07/2019. Repeat every year  Bone Density status: Completed 06/25/2017. Results reflect: Bone density results: OSTEOPENIA. Repeat every 2  years.  Lung Cancer Screening: (Low Dose CT Chest recommended if Age 41-80 years, 30 pack-year currently smoking OR have quit w/in 15years.) does not qualify.   Lung Cancer Screening Referral: No   Additional Screening:  Hepatitis C Screening: does not qualify; Completed yes   Vision Screening: Recommended annual ophthalmology exams for early detection of glaucoma and other disorders of the eye. Is the patient up to date with their annual eye exam?  Yes  Who is the provider or what is the name of the office in which the patient attends annual eye exams? Dr.Llyes  If pt is not established with a provider, would they like to be referred to a provider to establish care? No .   Dental Screening: Recommended annual dental exams for proper oral hygiene  Community Resource Referral / Chronic Care Management: CRR required this visit?  No   CCM required this visit?  No      Plan:   - refer to Audiologist for evaluation of hearing  - Dexa scan ordered  - due for 3rd COVID-19 vaccine   I have personally reviewed and noted the following in the patient's chart:   Medical and social history Use of alcohol, tobacco or illicit drugs  Current medications and supplements including opioid prescriptions.  Functional ability  and status Nutritional status Physical activity Advanced directives List of other physicians Hospitalizations, surgeries, and ER visits in previous 12 months Vitals Screenings to include cognitive, depression, and falls Referrals and appointments  In addition, I have reviewed and discussed with patient certain preventive protocols, quality metrics, and best practice recommendations. A written personalized care plan for preventive services as well as general preventive health recommendations were provided to patient.    Sandrea Hughs, NP   10/23/2020   Nurse Notes:  _ Advised to get her 3 rd COVID-19 vaccine at her pharmacy

## 2020-10-23 NOTE — Patient Instructions (Signed)
[name] , Thank you for taking time to come for your Medicare Wellness Visit. I appreciate your ongoing commitment to your health goals. Please review the following plan we discussed and let me know if I can assist you in the future.   Screening recommendations/referrals: Colonoscopy : Up to date  Mammogram : Up to date  Bone Density: Ordered today to be done at [facility]  Recommended yearly ophthalmology/optometry visit for glaucoma screening and checkup Recommended yearly dental visit for hygiene and checkup  Vaccinations: Influenza vaccine : Up to date  Pneumococcal vaccine : Up to date  Tdap vaccine : Up to date  Shingles vaccine : Up to date    Advanced directives: Yes   Conditions/risks identified: Advance age female > 26 yrs,Type 2 Diabetes Mellitus,Dyslipidemia,Obesity BMI > 30 and Hypertension   Next appointment: 1 year    Preventive Care 69 Years and Older, Female Preventive care refers to lifestyle choices and visits with your health care provider that can promote health and wellness. What does preventive care include? A yearly physical exam. This is also called an annual well check. Dental exams once or twice a year. Routine eye exams. Ask your health care provider how often you should have your eyes checked. Personal lifestyle choices, including: Daily care of your teeth and gums. Regular physical activity. Eating a healthy diet. Avoiding tobacco and drug use. Limiting alcohol use. Practicing safe sex. Taking low-dose aspirin every day. Taking vitamin and mineral supplements as recommended by your health care provider. What happens during an annual well check? The services and screenings done by your health care provider during your annual well check will depend on your age, overall health, lifestyle risk factors, and family history of disease. Counseling  Your health care provider may ask you questions about your: Alcohol use. Tobacco use. Drug  use. Emotional well-being. Home and relationship well-being. Sexual activity. Eating habits. History of falls. Memory and ability to understand (cognition). Work and work Statistician. Reproductive health. Screening  You may have the following tests or measurements: Height, weight, and BMI. Blood pressure. Lipid and cholesterol levels. These may be checked every 5 years, or more frequently if you are over 17 years old. Skin check. Lung cancer screening. You may have this screening every year starting at age 80 if you have a 30-pack-year history of smoking and currently smoke or have quit within the past 15 years. Fecal occult blood test (FOBT) of the stool. You may have this test every year starting at age 64. Flexible sigmoidoscopy or colonoscopy. You may have a sigmoidoscopy every 5 years or a colonoscopy every 10 years starting at age 66. Hepatitis C blood test. Hepatitis B blood test. Sexually transmitted disease (STD) testing. Diabetes screening. This is done by checking your blood sugar (glucose) after you have not eaten for a while (fasting). You may have this done every 1-3 years. Bone density scan. This is done to screen for osteoporosis. You may have this done starting at age 42. Mammogram. This may be done every 1-2 years. Talk to your health care provider about how often you should have regular mammograms. Talk with your health care provider about your test results, treatment options, and if necessary, the need for more tests. Vaccines  Your health care provider may recommend certain vaccines, such as: Influenza vaccine. This is recommended every year. Tetanus, diphtheria, and acellular pertussis (Tdap, Td) vaccine. You may need a Td booster every 10 years. Zoster vaccine. You may need this after  age 68. Pneumococcal 13-valent conjugate (PCV13) vaccine. One dose is recommended after age 20. Pneumococcal polysaccharide (PPSV23) vaccine. One dose is recommended after age  20. Talk to your health care provider about which screenings and vaccines you need and how often you need them. This information is not intended to replace advice given to you by your health care provider. Make sure you discuss any questions you have with your health care provider. Document Released: 04/13/2015 Document Revised: 12/05/2015 Document Reviewed: 01/16/2015 Elsevier Interactive Patient Education  2017 [place] Prevention in the Home Falls can cause injuries. They can happen to people of all ages. There are many things you can do to make your home safe and to help prevent falls. What can I do on the outside of my home? Regularly fix the edges of walkways and driveways and fix any cracks. Remove anything that might make you trip as you walk through a door, such as a raised step or threshold. Trim any bushes or trees on the path to your home. Use bright outdoor lighting. Clear any walking paths of anything that might make someone trip, such as rocks or tools. Regularly check to see if handrails are loose or broken. Make sure that both sides of any steps have handrails. Any raised decks and porches should have guardrails on the edges. Have any leaves, snow, or ice cleared regularly. Use sand or salt on walking paths during winter. Clean up any spills in your garage right away. This includes oil or grease spills. What can I do in the bathroom? Use night lights. Install grab bars by the toilet and in the tub and shower. Do not use towel bars as grab bars. Use non-skid mats or decals in the tub or shower. If you need to sit down in the shower, use a plastic, non-slip stool. Keep the floor dry. Clean up any water that spills on the floor as soon as it happens. Remove soap buildup in the tub or shower regularly. Attach bath mats securely with double-sided non-slip rug tape. Do not have throw rugs and other things on the floor that can make you trip. What can I do in the  bedroom? Use night lights. Make sure that you have a light by your bed that is easy to reach. Do not use any sheets or blankets that are too big for your bed. They should not hang down onto the floor. Have a firm chair that has side arms. You can use this for support while you get dressed. Do not have throw rugs and other things on the floor that can make you trip. What can I do in the kitchen? Clean up any spills right away. Avoid walking on wet floors. Keep items that you use a lot in easy-to-reach places. If you need to reach something above you, use a strong step stool that has a grab bar. Keep electrical cords out of the way. Do not use floor polish or wax that makes floors slippery. If you must use wax, use non-skid floor wax. Do not have throw rugs and other things on the floor that can make you trip. What can I do with my stairs? Do not leave any items on the stairs. Make sure that there are handrails on both sides of the stairs and use them. Fix handrails that are broken or loose. Make sure that handrails are as long as the stairways. Check any carpeting to make sure that it is firmly attached to the stairs.  Fix any carpet that is loose or worn. Avoid having throw rugs at the top or bottom of the stairs. If you do have throw rugs, attach them to the floor with carpet tape. Make sure that you have a light switch at the top of the stairs and the bottom of the stairs. If you do not have them, ask someone to add them for you. What else can I do to help prevent falls? Wear shoes that: Do not have high heels. Have rubber bottoms. Are comfortable and fit you well. Are closed at the toe. Do not wear sandals. If you use a stepladder: Make sure that it is fully opened. Do not climb a closed stepladder. Make sure that both sides of the stepladder are locked into place. Ask someone to hold it for you, if possible. Clearly mark and make sure that you can see: Any grab bars or  handrails. First and last steps. Where the edge of each step is. Use tools that help you move around (mobility aids) if they are needed. These include: Canes. Walkers. Scooters. Crutches. Turn on the lights when you go into a dark area. Replace any light bulbs as soon as they burn out. Set up your furniture so you have a clear path. Avoid moving your furniture around. If any of your floors are uneven, fix them. If there are any pets around you, be aware of where they are. Review your medicines with your doctor. Some medicines can make you feel dizzy. This can increase your chance of falling. Ask your doctor what other things that you can do to help prevent falls. This information is not intended to replace advice given to you by your health care provider. Make sure you discuss any questions you have with your health care provider. Document Released: 01/11/2009 Document Revised: 08/23/2015 Document Reviewed: 04/21/2014 Elsevier Interactive Patient Education  2017 [employer].

## 2020-10-24 DIAGNOSIS — G8929 Other chronic pain: Secondary | ICD-10-CM | POA: Diagnosis not present

## 2020-10-24 DIAGNOSIS — M5124 Other intervertebral disc displacement, thoracic region: Secondary | ICD-10-CM | POA: Diagnosis not present

## 2020-10-24 DIAGNOSIS — F419 Anxiety disorder, unspecified: Secondary | ICD-10-CM | POA: Diagnosis not present

## 2020-10-24 DIAGNOSIS — E119 Type 2 diabetes mellitus without complications: Secondary | ICD-10-CM | POA: Diagnosis not present

## 2020-10-24 DIAGNOSIS — M5021 Other cervical disc displacement,  high cervical region: Secondary | ICD-10-CM | POA: Diagnosis not present

## 2020-10-24 DIAGNOSIS — F4310 Post-traumatic stress disorder, unspecified: Secondary | ICD-10-CM | POA: Diagnosis not present

## 2020-10-24 DIAGNOSIS — I10 Essential (primary) hypertension: Secondary | ICD-10-CM | POA: Diagnosis not present

## 2020-10-24 DIAGNOSIS — G4700 Insomnia, unspecified: Secondary | ICD-10-CM | POA: Diagnosis not present

## 2020-10-24 DIAGNOSIS — F310 Bipolar disorder, current episode hypomanic: Secondary | ICD-10-CM | POA: Diagnosis not present

## 2020-10-29 DIAGNOSIS — F3181 Bipolar II disorder: Secondary | ICD-10-CM | POA: Diagnosis not present

## 2020-11-01 DIAGNOSIS — G4700 Insomnia, unspecified: Secondary | ICD-10-CM | POA: Diagnosis not present

## 2020-11-01 DIAGNOSIS — F310 Bipolar disorder, current episode hypomanic: Secondary | ICD-10-CM | POA: Diagnosis not present

## 2020-11-01 DIAGNOSIS — M5021 Other cervical disc displacement,  high cervical region: Secondary | ICD-10-CM | POA: Diagnosis not present

## 2020-11-01 DIAGNOSIS — G8929 Other chronic pain: Secondary | ICD-10-CM | POA: Diagnosis not present

## 2020-11-01 DIAGNOSIS — E119 Type 2 diabetes mellitus without complications: Secondary | ICD-10-CM | POA: Diagnosis not present

## 2020-11-01 DIAGNOSIS — I10 Essential (primary) hypertension: Secondary | ICD-10-CM | POA: Diagnosis not present

## 2020-11-01 DIAGNOSIS — F419 Anxiety disorder, unspecified: Secondary | ICD-10-CM | POA: Diagnosis not present

## 2020-11-01 DIAGNOSIS — F4310 Post-traumatic stress disorder, unspecified: Secondary | ICD-10-CM | POA: Diagnosis not present

## 2020-11-01 DIAGNOSIS — M5124 Other intervertebral disc displacement, thoracic region: Secondary | ICD-10-CM | POA: Diagnosis not present

## 2020-11-14 ENCOUNTER — Other Ambulatory Visit: Payer: Self-pay | Admitting: Family

## 2020-11-14 DIAGNOSIS — G894 Chronic pain syndrome: Secondary | ICD-10-CM

## 2020-11-20 ENCOUNTER — Other Ambulatory Visit: Payer: Medicare HMO

## 2020-11-20 DIAGNOSIS — E1169 Type 2 diabetes mellitus with other specified complication: Secondary | ICD-10-CM

## 2020-11-20 DIAGNOSIS — E782 Mixed hyperlipidemia: Secondary | ICD-10-CM

## 2020-11-20 DIAGNOSIS — I10 Essential (primary) hypertension: Secondary | ICD-10-CM

## 2020-11-21 ENCOUNTER — Other Ambulatory Visit: Payer: Medicare HMO

## 2020-11-21 ENCOUNTER — Other Ambulatory Visit: Payer: Self-pay

## 2020-11-21 DIAGNOSIS — E1169 Type 2 diabetes mellitus with other specified complication: Secondary | ICD-10-CM | POA: Diagnosis not present

## 2020-11-21 DIAGNOSIS — E782 Mixed hyperlipidemia: Secondary | ICD-10-CM | POA: Diagnosis not present

## 2020-11-21 DIAGNOSIS — I10 Essential (primary) hypertension: Secondary | ICD-10-CM | POA: Diagnosis not present

## 2020-11-21 DIAGNOSIS — E119 Type 2 diabetes mellitus without complications: Secondary | ICD-10-CM | POA: Diagnosis not present

## 2020-11-22 DIAGNOSIS — E119 Type 2 diabetes mellitus without complications: Secondary | ICD-10-CM | POA: Diagnosis not present

## 2020-11-22 DIAGNOSIS — F310 Bipolar disorder, current episode hypomanic: Secondary | ICD-10-CM | POA: Diagnosis not present

## 2020-11-22 DIAGNOSIS — I10 Essential (primary) hypertension: Secondary | ICD-10-CM | POA: Diagnosis not present

## 2020-11-22 DIAGNOSIS — G4700 Insomnia, unspecified: Secondary | ICD-10-CM | POA: Diagnosis not present

## 2020-11-22 DIAGNOSIS — F4310 Post-traumatic stress disorder, unspecified: Secondary | ICD-10-CM | POA: Diagnosis not present

## 2020-11-22 DIAGNOSIS — M5021 Other cervical disc displacement,  high cervical region: Secondary | ICD-10-CM | POA: Diagnosis not present

## 2020-11-22 DIAGNOSIS — F419 Anxiety disorder, unspecified: Secondary | ICD-10-CM | POA: Diagnosis not present

## 2020-11-22 DIAGNOSIS — M5124 Other intervertebral disc displacement, thoracic region: Secondary | ICD-10-CM | POA: Diagnosis not present

## 2020-11-22 DIAGNOSIS — G8929 Other chronic pain: Secondary | ICD-10-CM | POA: Diagnosis not present

## 2020-11-23 ENCOUNTER — Ambulatory Visit (INDEPENDENT_AMBULATORY_CARE_PROVIDER_SITE_OTHER): Payer: Medicare HMO | Admitting: Family

## 2020-11-23 ENCOUNTER — Other Ambulatory Visit: Payer: Self-pay

## 2020-11-23 ENCOUNTER — Ambulatory Visit: Payer: Medicare HMO | Admitting: Family

## 2020-11-23 ENCOUNTER — Encounter: Payer: Self-pay | Admitting: Family

## 2020-11-23 VITALS — BP 130/80 | HR 99 | Temp 97.4°F | Resp 18 | Ht 63.0 in | Wt 180.0 lb

## 2020-11-23 DIAGNOSIS — I10 Essential (primary) hypertension: Secondary | ICD-10-CM

## 2020-11-23 DIAGNOSIS — E782 Mixed hyperlipidemia: Secondary | ICD-10-CM

## 2020-11-23 DIAGNOSIS — J302 Other seasonal allergic rhinitis: Secondary | ICD-10-CM | POA: Diagnosis not present

## 2020-11-23 DIAGNOSIS — E1169 Type 2 diabetes mellitus with other specified complication: Secondary | ICD-10-CM

## 2020-11-23 DIAGNOSIS — Z23 Encounter for immunization: Secondary | ICD-10-CM

## 2020-11-23 DIAGNOSIS — F310 Bipolar disorder, current episode hypomanic: Secondary | ICD-10-CM | POA: Diagnosis not present

## 2020-11-23 DIAGNOSIS — F411 Generalized anxiety disorder: Secondary | ICD-10-CM

## 2020-11-23 LAB — LIPID PANEL
Cholesterol: 279 mg/dL — ABNORMAL HIGH (ref <200)
HDL: 50 mg/dL (ref >=50)
LDL Cholesterol (Calc): 200 mg/dL (calc) — ABNORMAL HIGH
Non-HDL Cholesterol (Calc): 229 mg/dL (calc) — ABNORMAL HIGH (ref <130)
Total CHOL/HDL Ratio: 5.6 (calc) — ABNORMAL HIGH (ref <5.0)
Triglycerides: 138 mg/dL (ref <150)

## 2020-11-23 LAB — COMPLETE METABOLIC PANEL WITH GFR
AG Ratio: 2.2 (calc) (ref 1.0–2.5)
ALT: 13 U/L (ref 6–29)
AST: 20 U/L (ref 10–35)
Albumin: 4.8 g/dL (ref 3.6–5.1)
Alkaline phosphatase (APISO): 76 U/L (ref 37–153)
BUN: 20 mg/dL (ref 7–25)
CO2: 21 mmol/L (ref 20–32)
Calcium: 9.4 mg/dL (ref 8.6–10.4)
Chloride: 94 mmol/L — ABNORMAL LOW (ref 98–110)
Creat: 0.99 mg/dL (ref 0.60–1.00)
Globulin: 2.2 g/dL (calc) (ref 1.9–3.7)
Glucose, Bld: 121 mg/dL — ABNORMAL HIGH (ref 65–99)
Potassium: 4.6 mmol/L (ref 3.5–5.3)
Sodium: 126 mmol/L — ABNORMAL LOW (ref 135–146)
Total Bilirubin: 0.7 mg/dL (ref 0.2–1.2)
Total Protein: 7 g/dL (ref 6.1–8.1)
eGFR: 61 mL/min/{1.73_m2} (ref >=60)

## 2020-11-23 LAB — CBC WITH DIFFERENTIAL/PLATELET
Absolute Monocytes: 342 cells/uL (ref 200–950)
Basophils Absolute: 11 cells/uL (ref 0–200)
Basophils Relative: 0.2 %
Eosinophils Absolute: 0 cells/uL — ABNORMAL LOW (ref 15–500)
Eosinophils Relative: 0 %
HCT: 40.6 % (ref 35.0–45.0)
Hemoglobin: 13.9 g/dL (ref 11.7–15.5)
Lymphs Abs: 974 cells/uL (ref 850–3900)
MCH: 30 pg (ref 27.0–33.0)
MCHC: 34.2 g/dL (ref 32.0–36.0)
MCV: 87.5 fL (ref 80.0–100.0)
MPV: 10.4 fL (ref 7.5–12.5)
Monocytes Relative: 6.1 %
Neutro Abs: 4273 cells/uL (ref 1500–7800)
Neutrophils Relative %: 76.3 %
Platelets: 245 10*3/uL (ref 140–400)
RBC: 4.64 10*6/uL (ref 3.80–5.10)
RDW: 12.2 % (ref 11.0–15.0)
Total Lymphocyte: 17.4 %
WBC: 5.6 10*3/uL (ref 3.8–10.8)

## 2020-11-23 LAB — HEMOGLOBIN A1C
Hgb A1c MFr Bld: 6 % of total Hgb — ABNORMAL HIGH (ref <5.7)
Mean Plasma Glucose: 126 mg/dL
eAG (mmol/L): 7 mmol/L

## 2020-11-23 LAB — TEST AUTHORIZATION

## 2020-11-23 LAB — TSH: TSH: 1.64 mIU/L (ref 0.40–4.50)

## 2020-11-23 MED ORDER — METFORMIN HCL 500 MG PO TABS: 500.0000 mg | ORAL_TABLET | Freq: Every day | ORAL | 2 refills | Status: DC

## 2020-11-23 NOTE — Progress Notes (Signed)
Provider: Marlowe Sax FNP-C   Peter Daquila, [name], NP  Patient Care Team: Courtlynn Holloman, [name], NP as PCP - General (Family Medicine) Brien Few, MD as Consulting Physician (Obstetrics and Gynecology) Katy Apo, MD as Consulting Physician (Ophthalmology)  Extended Emergency Contact Information Primary Emergency Contact: [place], [place] Phone: [phone] Relation: Son  Code Status:  [place] e Goals of care: Advanced Directive information Advanced Directives 11/23/2020  Does Patient Have a Medical Advance Directive? Yes  Type of Paramedic of [place];Living will  Does patient want to make changes to medical advance directive? No - Patient declined  Copy of [place] in Chart? Yes - validated most recent copy scanned in chart (See row information)  Would patient like information on creating a medical advance directive? -     Chief Complaint  Patient presents with   Medical Management of Chronic Issues    6 month follow-up and discuss need for coivd vaccine # 3. Flu vaccine today.     HPI:  Pt is a 70 y.o. female seen today 6 months follow up for medical management of chronic diseases.she denies any acute issues today.Has a medical history of Type 2 DM,Hyperlipidemia,Generalized Anxiety disorder,Bipolar 1 disorder ,major depression, PTSD among others. States CBG are well controlled in the 120's.Has been fasting and eating one meal.she follows fasting instruction in a book Diabetes Code Intermittent fasting written by Macario Carls. Eats vegetables but no carbohydrates.Has been eating more chicken.  Has lost 11 lbs over two months she started on the intermittent fasting diet.does not take metformin if blood sugars are within normal range.   Recent lab work showed Chol 279 previous 253 LDL 200 previous 163  States depression symptoms are stable.Just recently published a book :  God's work for healing and wholeness Find Biblical Answers and Challenges of Body,Mind and Spirit by A similar Sister.States would like book passed to other staff and placed at the [facility] for the patient's to read too.    Past Medical History:  Diagnosis Date   Anxiety    Bipolar 1 disorder ([place])    Bulging of cervical intervertebral disc    C2   Bulging of thoracic intervertebral disc    t4   Depression    Diabetes mellitus without complication ([place])    Hyperlipidemia    PTSD (post-traumatic stress disorder)    Sad    Past Surgical History:  Procedure Laterality Date   CATARACT EXTRACTION Right 08/11/2017   CATARACT EXTRACTION Left 09/01/2017   [place]   JOINT REPLACEMENT  2013   hip    Allergies  Allergen Reactions   Aspirin Other (See Comments)   Other Other (See Comments)    "makes my urine turn brown" Other reaction(s): MUSCLE PAIN "makes my urine turn brown"   Oxycodone     Migraines   Statins Other (See Comments)    "makes my urine turn brown" Other reaction(s): MUSCLE PAIN "makes my urine turn brown"    Allergies as of 11/23/2020       Reactions   Aspirin Other (See Comments)   Other Other (See Comments)   "makes my urine turn brown" Other reaction(s): MUSCLE PAIN "makes my urine turn brown"   Oxycodone    Migraines   Statins Other (See Comments)   "makes my urine turn brown" Other reaction(s): MUSCLE  PAIN "makes my urine turn brown"        Medication List        Accurate as of November 23, 2020 11:59 PM. If you have any questions, ask your nurse or doctor.          Accu-Chek FastClix Lancets Misc Use to test blood sugar three times daily. Dx: E11.9   amphetamine-dextroamphetamine 25 MG 24 hr capsule Commonly known as: ADDERALL XR Take 25 mg by mouth daily. Brand name ONLY   ARIPiprazole ER 300 MG Prsy prefilled syringe Commonly known as: ABILIFY MAINTENA Inject 300 mg into the muscle every 30 (thirty)  days. Last injection 10/28/2019 Brand Name ONLY   ASTAXANTHIN PO Take 12 mg by mouth daily.   BERBERINE COMPLEX PO Take by mouth. 500 mg 3-4 times daily   clonazePAM 1 MG tablet Commonly known as: KLONOPIN 0.5 mg.   Continuous Glucose Monitor Sup Kit 1 kit by Does not apply route 3 (three) times daily.   FreeStyle Libre 2 Reader Devi 1 Device by Does not apply route daily. E11.9   FreeStyle Libre 2 Sensor Misc Use to test blood sugar three times daily. Dx: E11.9   gabapentin 300 MG capsule Commonly known as: NEURONTIN TAKE 1 CAPSULE BY MOUTH THREE TIMES DAILY   lamoTRIgine 200 MG tablet Commonly known as: LAMICTAL Take 200 mg by mouth 2 (two) times daily. Takes Brand Name ONLY   lisinopril 10 MG tablet Commonly known as: ZESTRIL TAKE 1 TABLET(10 MG) BY MOUTH DAILY   loratadine 10 MG tablet Commonly known as: CLARITIN Take 1 tablet (10 mg total) by mouth daily.   metFORMIN 500 MG tablet Commonly known as: GLUCOPHAGE Take 1 tablet (500 mg total) by mouth daily with breakfast. What changed:  medication strength how much to take Changed by: [name] Kareema Keitt, NP   nystatin cream Commonly known as: MYCOSTATIN Apply 1 application topically 2 (two) times daily.   sertraline 25 MG tablet Commonly known as: ZOLOFT Take 25 mg by mouth daily.   vitamin C 1000 MG tablet Take 1,000 mg by mouth daily.        Review of Systems  Constitutional:  Negative for appetite change, chills, fatigue, fever and unexpected weight change.  HENT:  Negative for congestion, dental problem, ear discharge, ear pain, facial swelling, hearing loss, nosebleeds, postnasal drip, rhinorrhea, sinus pressure, sinus pain, sneezing, sore throat, tinnitus and trouble swallowing.   Eyes:  Negative for pain, discharge, redness, itching and visual disturbance.  Respiratory:  Negative for cough, chest tightness, shortness of breath and wheezing.   Cardiovascular:  Negative for chest pain, palpitations  and leg swelling.  Gastrointestinal:  Negative for abdominal distention, abdominal pain, blood in stool, constipation, diarrhea, nausea and vomiting.  Endocrine: Negative for cold intolerance, heat intolerance, polydipsia, polyphagia and polyuria.  Genitourinary:  Negative for difficulty urinating, dysuria, flank pain, frequency and urgency.  Musculoskeletal:  Negative for arthralgias, back pain, gait problem, joint swelling, myalgias, neck pain and neck stiffness.  Skin:  Negative for color change, pallor, rash and wound.  Neurological:  Negative for dizziness, syncope, speech difficulty, weakness, light-headedness, numbness and headaches.  Hematological:  Does not bruise/bleed easily.  Psychiatric/Behavioral:  Negative for agitation, behavioral problems, confusion, hallucinations, self-injury, sleep disturbance and suicidal ideas. The patient is not nervous/anxious.    Immunization History  Administered Date(s) Administered   Fluad Quad(high Dose 65+) 12/21/2018, 12/02/2019, 11/23/2020   Influenza,inj,Quad PF,6+ Mos 04/04/2016   Influenza-Unspecified 12/29/2013, 12/14/2017   PFIZER(Purple Top)SARS-COV-2  Vaccination 08/18/2019, 09/12/2019   Pneumococcal Conjugate-13 01/04/2016   Pneumococcal Polysaccharide-23 09/30/2017   Tdap 09/29/2015   Zoster Recombinat (Shingrix) 12/05/2017, 06/03/2018   Pertinent  Health Maintenance Due  Topic Date Due   OPHTHALMOLOGY EXAM  04/04/2021   HEMOGLOBIN A1C  05/24/2021   FOOT EXAM  05/28/2021   MAMMOGRAM  11/06/2021   COLONOSCOPY (Pts 45-9yr Insurance coverage will need to be confirmed)  10/18/2028   INFLUENZA VACCINE  Completed   DEXA SCAN  Completed   PNA vac Low Risk Adult  Completed   Fall Risk  11/23/2020 10/23/2020 09/21/2020 08/03/2020 07/23/2020  Falls in the past year? 1 1 0 0 0  Number falls in past yr: 1 1 0 0 0  Comment - - - - -  Injury with Fall? 0 0 0 0 0  Comment - - - - -  Risk for fall due to : History of fall(s) History of  fall(s) No Fall Risks - -  Follow up Falls evaluation completed Falls evaluation completed - - -   Functional Status Survey:    Vitals:   11/23/20 1128  BP: 130/80  Pulse: 99  Resp: 18  Temp: (!) 97.4 F (36.3 C)  TempSrc: Temporal  SpO2: 97%  Weight: 180 lb (81.6 kg)  Height: '5\' 3"'  (1.6 m)   Body mass index is 31.89 kg/m. Physical Exam Vitals reviewed.  Constitutional:      General: She is not in acute distress.    Appearance: Normal appearance. She is obese. She is not ill-appearing or diaphoretic.  HENT:     Head: Normocephalic.     Right Ear: Tympanic membrane, ear canal and external ear normal. There is no impacted cerumen.     Left Ear: Tympanic membrane, ear canal and external ear normal. There is no impacted cerumen.     Nose: Nose normal. No congestion or rhinorrhea.     Mouth/Throat:     Mouth: Mucous membranes are moist.     Pharynx: Oropharynx is clear. No oropharyngeal exudate or posterior oropharyngeal erythema.  Eyes:     General: No scleral icterus.       Right eye: No discharge.        Left eye: No discharge.     Extraocular Movements: Extraocular movements intact.     Conjunctiva/sclera: Conjunctivae normal.     Pupils: Pupils are equal, round, and reactive to light.  Neck:     Vascular: No carotid bruit.  Cardiovascular:     Rate and Rhythm: Normal rate and regular rhythm.     Pulses: Normal pulses.     Heart sounds: Normal heart sounds. No murmur heard.   No friction rub. No gallop.  Pulmonary:     Effort: Pulmonary effort is normal. No respiratory distress.     Breath sounds: Normal breath sounds. No wheezing, rhonchi or rales.  Chest:     Chest wall: No tenderness.  Abdominal:     General: Bowel sounds are normal. There is no distension.     Palpations: Abdomen is soft. There is no mass.     Tenderness: There is no abdominal tenderness. There is no right CVA tenderness, left CVA tenderness, guarding or rebound.  Musculoskeletal:         General: No swelling or tenderness. Normal range of motion.     Cervical back: Normal range of motion. No rigidity or tenderness.     Right lower leg: No edema.     Left lower leg: No  edema.  Lymphadenopathy:     Cervical: No cervical adenopathy.  Skin:    General: Skin is warm and dry.     Coloration: Skin is not pale.     Findings: No bruising, erythema, lesion or rash.  Neurological:     Mental Status: She is alert and oriented to person, place, and time.     Cranial Nerves: No cranial nerve deficit.     Sensory: No sensory deficit.     Motor: No weakness.     Coordination: Coordination normal.     Gait: Gait normal.  Psychiatric:        Mood and Affect: Mood normal.        Speech: Speech normal.        Behavior: Behavior normal.        Thought Content: Thought content normal.        Judgment: Judgment normal.    Labs reviewed: Recent Labs    05/25/20 0834 11/21/20 1417  NA 137 126*  K 4.1 4.6  CL 102 94*  CO2 23 21  GLUCOSE 161* 121*  BUN 21 20  CREATININE 0.97* 0.99  CALCIUM 9.4 9.4   Recent Labs    05/25/20 0834 11/21/20 1417  AST 15 20  ALT 15 13  BILITOT 0.4 0.7  PROT 6.6 7.0   Recent Labs    05/25/20 0834 11/21/20 1417  WBC 4.5 5.6  NEUTROABS 3,123 4,273  HGB 14.4 13.9  HCT 43.1 40.6  MCV 89.6 87.5  PLT 223 245   Lab Results  Component Value Date   TSH 1.64 11/21/2020   Lab Results  Component Value Date   HGBA1C 6.0 (H) 11/21/2020   Lab Results  Component Value Date   CHOL 279 (H) 11/21/2020   HDL 50 11/21/2020   LDLCALC 200 (H) 11/21/2020   TRIG 138 11/21/2020   CHOLHDL 5.6 (H) 11/21/2020    Significant Diagnostic Results in last 30 days:  No results found.  Assessment/Plan 1. Need for influenza vaccination Asymptomatic  Vaccine administered by Caroll Rancher  - Flu Vaccine QUAD High Dose(Fluad)  2. Type 2 diabetes mellitus with other specified complication, without long-term current use of insulin (HCC) Lab Results   Component Value Date   HGBA1C 6.0 (H) 11/21/2020  CBG well controlled. - reduce Metformin from 1000 mg tablet to 500 mg tablet daily  Advised to check CBG at home if consistently > 75 - 120's will discontinue metformin - on ACE inhibitor for renal protection  - Not on statin due to allergies.  - continue on dietary modification.recommended exercise at least 3 times per week for 30 minutes advised. - additional DASH Eating plan Education information provided on AVS  - TSH; Future - metFORMIN (GLUCOPHAGE) 500 MG tablet; Take 1 tablet (500 mg total) by mouth daily with breakfast.  Dispense: 30 tablet; Refill: 2 - Hemoglobin A1c; Future  3. Essential hypertension B/p at goal. Continue on lisinopril  - CBC with Differential/Platelet; Future - CMP with eGFR([facility]); Future  4. Mixed hyperlipidemia LDL not at goal.Has worsen due to recent dietary changes with her intermittent fasting. Advised to reduce saturated fats in diet. Will recheck lipid in 3 months. - Lipid panel; Future - Lipid panel; Future  5. Seasonal allergic rhinitis, unspecified trigger Symptoms well controlled. - continue on loratadine  6. Generalized anxiety disorder Stable  - continue on clonazepam   7. Bipolar affective disorder, current episode hypomanic ([place]) Mood stable Recently published a book God's work for  healing and wholeness Find Biblical Answers and Challenges of Body,Mind and Spirit by A similar Sister.States would like book passed to other staff and placed at the [facility] for the patient's to read too.  Congratulated on publishing her book.  - continue on [place]   Family/ staff Communication: Reviewed plan of care with patient verbalized understanding.   Labs/tests ordered:  - CBC with Differential/Platelet - CMP with eGFR([facility]) - TSH - Hgb A1C - Lipid panel - Lipid panel  Next Appointment : 6 months for medical management of chronic issues.Fasting Labs prior to visit. Also 3 months  for lab: lipid and Hgb A1C    Alaisha Eversley C Alexcia Schools, NP

## 2020-11-26 DIAGNOSIS — M792 Neuralgia and neuritis, unspecified: Secondary | ICD-10-CM | POA: Diagnosis not present

## 2020-11-26 DIAGNOSIS — I70219 Atherosclerosis of native arteries of extremities with intermittent claudication, unspecified extremity: Secondary | ICD-10-CM | POA: Diagnosis not present

## 2020-11-26 DIAGNOSIS — B351 Tinea unguium: Secondary | ICD-10-CM | POA: Diagnosis not present

## 2020-11-26 DIAGNOSIS — E1351 Other specified diabetes mellitus with diabetic peripheral angiopathy without gangrene: Secondary | ICD-10-CM | POA: Diagnosis not present

## 2020-11-26 DIAGNOSIS — L603 Nail dystrophy: Secondary | ICD-10-CM | POA: Diagnosis not present

## 2020-11-26 DIAGNOSIS — I739 Peripheral vascular disease, unspecified: Secondary | ICD-10-CM | POA: Diagnosis not present

## 2020-11-26 DIAGNOSIS — L84 Corns and callosities: Secondary | ICD-10-CM | POA: Diagnosis not present

## 2020-11-26 DIAGNOSIS — M205X1 Other deformities of toe(s) (acquired), right foot: Secondary | ICD-10-CM | POA: Diagnosis not present

## 2020-11-27 ENCOUNTER — Other Ambulatory Visit: Payer: Medicare HMO

## 2020-11-30 ENCOUNTER — Other Ambulatory Visit: Payer: Medicare HMO

## 2020-11-30 ENCOUNTER — Ambulatory Visit: Payer: Medicare HMO | Admitting: Family

## 2020-12-04 ENCOUNTER — Encounter: Payer: Medicare HMO | Admitting: Family

## 2020-12-05 ENCOUNTER — Encounter: Payer: Self-pay | Admitting: Adult Health

## 2020-12-05 ENCOUNTER — Telehealth (INDEPENDENT_AMBULATORY_CARE_PROVIDER_SITE_OTHER): Payer: Medicare HMO | Admitting: Adult Health

## 2020-12-05 ENCOUNTER — Other Ambulatory Visit: Payer: Self-pay

## 2020-12-05 ENCOUNTER — Telehealth: Payer: Medicare HMO | Admitting: Physician Assistant

## 2020-12-05 VITALS — Temp 99.5°F | Ht 63.0 in | Wt 178.0 lb

## 2020-12-05 DIAGNOSIS — Z7189 Other specified counseling: Secondary | ICD-10-CM | POA: Diagnosis not present

## 2020-12-05 DIAGNOSIS — J069 Acute upper respiratory infection, unspecified: Secondary | ICD-10-CM | POA: Diagnosis not present

## 2020-12-05 DIAGNOSIS — U071 COVID-19: Secondary | ICD-10-CM | POA: Diagnosis not present

## 2020-12-05 MED ORDER — DOXYCYCLINE MONOHYDRATE 100 MG PO CAPS: 100.0000 mg | ORAL_CAPSULE | Freq: Two times a day (BID) | ORAL | 0 refills | Status: AC

## 2020-12-05 MED ORDER — SACCHAROMYCES BOULARDII 250 MG PO CAPS: 250.0000 mg | ORAL_CAPSULE | Freq: Two times a day (BID) | ORAL | 0 refills | Status: AC

## 2020-12-05 MED ORDER — MOLNUPIRAVIR EUA 200MG CAPSULE: 4.0000 | ORAL_CAPSULE | Freq: Two times a day (BID) | ORAL | 0 refills | Status: AC

## 2020-12-05 MED ORDER — GUAIFENESIN ER 600 MG PO TB12: 600.0000 mg | ORAL_TABLET | Freq: Two times a day (BID) | ORAL | 0 refills | Status: AC

## 2020-12-05 NOTE — Progress Notes (Signed)
Virtual Visit Consent   Cordia Miklos, you are scheduled for a virtual visit with a [facility] provider today.     Just as with appointments in the office, your consent must be obtained to participate.  Your consent will be active for this visit and any virtual visit you may have with one of our providers in the next 365 days.     If you have a MyChart account, a copy of this consent can be sent to you electronically.  All virtual visits are billed to your insurance company just like a traditional visit in the office.    As this is a virtual visit, video technology does not allow for your provider to perform a traditional examination.  This may limit your provider's ability to fully assess your condition.  If your provider identifies any concerns that need to be evaluated in person or the need to arrange testing (such as labs, EKG, etc.), we will make arrangements to do so.     Although advances in technology are sophisticated, we cannot ensure that it will always work on either your end or our end.  If the connection with a video visit is poor, the visit may have to be switched to a telephone visit.  With either a video or telephone visit, we are not always able to ensure that we have a secure connection.     I need to obtain your verbal consent now.   Are you willing to proceed with your visit today?    Kaitlyn Wood has provided verbal consent on 12/05/2020 for a virtual visit (video or telephone).   Leeanne Rio, [place]   Date: 12/05/2020 6:46 PM   Virtual Visit via Video Note   I, Leeanne Rio, connected with  Kaitlyn Wood  ([id number], [birth year]) on 12/05/20 at  6:45 PM EDT by a video-enabled telemedicine application and verified that I am speaking with the correct person using two identifiers.  Location: Patient: Virtual Visit Location Patient: Home Provider: Virtual Visit Location Provider: Home Office   I discussed the limitations of evaluation and management by  telemedicine and the availability of in person appointments. The patient expressed understanding and agreed to proceed.    History of Present Illness: Kaitlyn Wood is a 70 y.o. who identifies as a female who was assigned female at birth, and is being seen today for medication counseling. She was seen earlier today via video by her primary care provider for COVID and secondary bacterial URI. Was started on Doxycycline, Molnupiravir and given scripts for Guaifenesin and Florsastor. States she was able to pick this up but wanted to make sure they are ok to take with her regular medications and together with each other. Marland Kitchen  HPI: HPI  Problems:  Patient Active Problem List   Diagnosis Date Noted   Myopathy 03/06/2020   Statin intolerance 11/20/2019   Fever 04/12/2019   OSA (obstructive sleep apnea) 12/14/2018   Moderate obesity 12/14/2018   Frequent falls 09/23/2016   Insomnia 04/06/2016   Chronic pain of right knee 04/06/2016   Type 2 diabetes mellitus without complication, without long-term current use of insulin ([place]) 04/06/2016   Mixed hyperlipidemia 04/06/2016   High risk medication use 04/06/2016   Urge incontinence of urine 04/06/2016   Elevated BP without diagnosis of hypertension 04/06/2016   Bipolar affective disorder ([place]) 08/02/2012   Essential hypertension 08/02/2012   Posttraumatic stress disorder 08/02/2012   Osteoarthrosis, pelvic region and thigh 08/02/2012   History of total  hip replacement 08/02/2012   Anxiety state 08/02/2012   Encounter for rehabilitation 08/02/2012   Primary localized osteoarthrosis, pelvic region and thigh 04/07/2011    Allergies:  Allergies  Allergen Reactions   Aspirin Other (See Comments)   Other Other (See Comments)    "makes my urine turn brown" Other reaction(s): MUSCLE PAIN "makes my urine turn brown"   Oxycodone     Migraines   Statins Other (See Comments)    "makes my urine turn brown" Other reaction(s): MUSCLE PAIN "makes my  urine turn brown"   Medications:  Current Outpatient Medications:    Accu-Chek FastClix Lancets MISC, Use to test blood sugar three times daily. Dx: E11.9, Disp: 306 each, Rfl: 11   amphetamine-dextroamphetamine (ADDERALL XR) 25 MG 24 hr capsule, Take 25 mg by mouth daily. Brand name ONLY, Disp: , Rfl:    ARIPiprazole ER (ABILIFY MAINTENA) 300 MG PRSY prefilled syringe, Inject 300 mg into the muscle every 30 (thirty) days. Last injection 10/28/2019 Brand Name ONLY, Disp: , Rfl:    Ascorbic Acid (VITAMIN C) 1000 MG tablet, Take 1,000 mg by mouth daily., Disp: , Rfl:    ASTAXANTHIN PO, Take 12 mg by mouth daily. , Disp: , Rfl:    Barberry-Oreg Grape-Goldenseal (BERBERINE COMPLEX PO), Take by mouth. 500 mg 3-4 times daily (Patient not taking: Reported on 11/23/2020), Disp: , Rfl:    clonazePAM (KLONOPIN) 1 MG tablet, 0.5 mg., Disp: , Rfl:    Continuous Blood Gluc Receiver (FREESTYLE LIBRE 2 READER) DEVI, 1 Device by Does not apply route daily. E11.9, Disp: 1 each, Rfl: 11   Continuous Blood Gluc Sensor (FREESTYLE LIBRE 2 SENSOR) MISC, Use to test blood sugar three times daily. Dx: E11.9, Disp: 2 each, Rfl: 5   Continuous Glucose Monitor Sup KIT, 1 kit by Does not apply route 3 (three) times daily., Disp: 1 kit, Rfl: 0   doxycycline (MONODOX) 100 MG capsule, Take 1 capsule (100 mg total) by mouth 2 (two) times daily for 7 days., Disp: 14 capsule, Rfl: 0   gabapentin (NEURONTIN) 300 MG capsule, TAKE 1 CAPSULE BY MOUTH THREE TIMES DAILY, Disp: 270 capsule, Rfl: 1   guaiFENesin (MUCINEX) 600 MG 12 hr tablet, Take 1 tablet (600 mg total) by mouth 2 (two) times daily for 14 days., Disp: 28 tablet, Rfl: 0   lamoTRIgine (LAMICTAL) 200 MG tablet, Take 200 mg by mouth 2 (two) times daily. Takes Brand Name ONLY, Disp: , Rfl:    lisinopril (ZESTRIL) 10 MG tablet, TAKE 1 TABLET(10 MG) BY MOUTH DAILY, Disp: 90 tablet, Rfl: 1   loratadine (CLARITIN) 10 MG tablet, Take 1 tablet (10 mg total) by mouth daily., Disp: 90  tablet, Rfl: 1   metFORMIN (GLUCOPHAGE) 500 MG tablet, Take 1 tablet (500 mg total) by mouth daily with breakfast., Disp: 30 tablet, Rfl: 2   molnupiravir EUA 200 mg CAPS, Take 4 capsules (800 mg total) by mouth 2 (two) times daily for 5 days., Disp: 40 capsule, Rfl: 0   nystatin cream (MYCOSTATIN), Apply 1 application topically 2 (two) times daily., Disp: 30 g, Rfl: 0   saccharomyces boulardii (FLORASTOR) 250 MG capsule, Take 1 capsule (250 mg total) by mouth 2 (two) times daily for 10 days., Disp: 20 capsule, Rfl: 0   sertraline (ZOLOFT) 25 MG tablet, Take 25 mg by mouth daily., Disp: , Rfl:   Observations/Objective: Patient is well-developed, well-nourished in no acute distress.  Resting comfortably at home.  Head is normocephalic, atraumatic.  No labored  breathing. Speech is clear and coherent with logical content.  Patient is alert and oriented at baseline.   Assessment and Plan: 1. Encounter for medication counseling Reviewed chronic medications and allergies list to ensure I could give her proper advice. Reviewed her encounter from earlier today. Discussed medications are safe to take together. She can start with the probiotic and Guaifenesin since she has taken before. Can start antiviral tonight and wait until tomorrow morning before starting the antibiotic so that if she notes a side effect she would be more likely to tell which medicine was causing the issue. Sent copy of note to her PCP.   Follow Up Instructions: I discussed the assessment and treatment plan with the patient. The patient was provided an opportunity to ask questions and all were answered. The patient agreed with the plan and demonstrated an understanding of the instructions.  A copy of instructions were sent to the patient via MyChart.  The patient was advised to call back or seek an in-person evaluation if the symptoms worsen or if the condition fails to improve as anticipated.  Time:  I spent 10 minutes with the  patient via telehealth technology discussing the above problems/concerns.    Leeanne Rio, PA-C

## 2020-12-05 NOTE — Progress Notes (Signed)
  This encounter was created in error - please disregard. No show 

## 2020-12-05 NOTE — Progress Notes (Signed)
This service is provided via telemedicine  No vital signs collected/recorded due to the encounter was a telemedicine visit.   Location of patient (ex: home, work):  Home  Patient consents to a telephone visit:  Yes, refer to telephone consent on 03/19/2020  Location of the provider (ex: office, home):  [facility]   Name of any referring provider:  n/a  Names of all persons participating in the telemedicine service and their role in the encounter:  Patient, Einar Pheasant, NP, and Lesly Rubenstein Zepeda/ CMA  Time spent on call:  Patient spent 8 minutes on call with medical assistant       DATE:  12/05/2020 MRN:  [id number]  BIRTHDAY: [birth year]   Contact Information     Name Relation Home Work Mobile   Clunn,Christopher Son [phone]          Advance Directive Documentation    Flowsheet Row Most Recent Value  Type of Advance Directive Healthcare Power of Attorney, Living will  Pre-existing out of facility DNR order (yellow form or pink MOST form) --  "MOST" Form in Place? --        Chief Complaint  Patient presents with   Acute Visit    Patient pt had positive Covid test. Patient reports fever, headaches, coughing, congestion, dizziness, fatigue, muscle pain and loss of appetite since yesterday morning.     HISTORY OF PRESENT ILLNESS:  The provider is at [facility] and the patient is at home.  This is a 70 year old female who had a failed video visit today so a telephone visit was done. She stated that she tested positive for COVID-19 home test. She has sore throat, nasal congestion, headache, dizziness and body ache. These symptoms started yesterday. She stated that her "throat seems to be closing up last night." She had temperature of 99.6 yesterday. She stated that she has sense of smell and taste.  She has attended a party last weekend and was not wearing a mask.   PAST MEDICAL HISTORY:  Past Medical History:  Diagnosis Date    Anxiety    Bipolar 1 disorder ([place])    Bulging of cervical intervertebral disc    C2   Bulging of thoracic intervertebral disc    t4   Depression    Diabetes mellitus without complication (HCC)    Hyperlipidemia    PTSD (post-traumatic stress disorder)    Sad      CURRENT MEDICATIONS: Reviewed  Patient's Medications  New Prescriptions   No medications on file  Previous Medications   ACCU-CHEK FASTCLIX LANCETS MISC    Use to test blood sugar three times daily. Dx: E11.9   AMPHETAMINE-DEXTROAMPHETAMINE (ADDERALL XR) 25 MG 24 HR CAPSULE    Take 25 mg by mouth daily. Brand name ONLY   ARIPIPRAZOLE ER (ABILIFY MAINTENA) 300 MG PRSY PREFILLED SYRINGE    Inject 300 mg into the muscle every 30 (thirty) days. Last injection 10/28/2019 Brand Name ONLY   ASCORBIC ACID (VITAMIN C) 1000 MG TABLET    Take 1,000 mg by mouth daily.   ASTAXANTHIN PO    Take 12 mg by mouth daily.    BARBERRY-OREG GRAPE-GOLDENSEAL (BERBERINE COMPLEX PO)    Take by mouth. 500 mg 3-4 times daily   CLONAZEPAM (KLONOPIN) 1 MG TABLET    0.5 mg.   CONTINUOUS BLOOD GLUC RECEIVER (FREESTYLE LIBRE 2 READER) DEVI    1 Device by Does not apply route daily. E11.9   CONTINUOUS BLOOD GLUC SENSOR (FREESTYLE  LIBRE 2 SENSOR) MISC    Use to test blood sugar three times daily. Dx: E11.9   CONTINUOUS GLUCOSE MONITOR SUP KIT    1 kit by Does not apply route 3 (three) times daily.   GABAPENTIN (NEURONTIN) 300 MG CAPSULE    TAKE 1 CAPSULE BY MOUTH THREE TIMES DAILY   LAMOTRIGINE (LAMICTAL) 200 MG TABLET    Take 200 mg by mouth 2 (two) times daily. Takes Brand Name ONLY   LISINOPRIL (ZESTRIL) 10 MG TABLET    TAKE 1 TABLET(10 MG) BY MOUTH DAILY   LORATADINE (CLARITIN) 10 MG TABLET    Take 1 tablet (10 mg total) by mouth daily.   METFORMIN (GLUCOPHAGE) 500 MG TABLET    Take 1 tablet (500 mg total) by mouth daily with breakfast.   NYSTATIN CREAM (MYCOSTATIN)    Apply 1 application topically 2 (two) times daily.   SERTRALINE (ZOLOFT) 25 MG  TABLET    Take 25 mg by mouth daily.  Modified Medications   No medications on file  Discontinued Medications   No medications on file     Allergies  Allergen Reactions   Aspirin Other (See Comments)   Other Other (See Comments)    "makes my urine turn brown" Other reaction(s): MUSCLE PAIN "makes my urine turn brown"   Oxycodone     Migraines   Statins Other (See Comments)    "makes my urine turn brown" Other reaction(s): MUSCLE PAIN "makes my urine turn brown"     REVIEW OF SYSTEMS:  GENERAL: no change in appetite, no fatigue, no weight changes, + fever, SKIN: Denies rash, itching, wounds, ulcer sores, or nail abnormality EYES: Denies change in vision, dry eyes, eye pain, itching or discharge EARS: Denies change in hearing, ringing in ears, or earache NOSE: +nasal congestion  MOUTH and THROAT: +sore throat RESPIRATORY: no wheezing, hemoptysis CARDIAC: no chest pain, edema or palpitations GI: no abdominal pain, diarrhea, constipation, heart burn, nausea or vomiting GU: Denies dysuria, frequency, hematuria, incontinence, or discharge MUSCULOSKELETAL: +bodyaches CIRCULATION: Denies claudication, edema of legs, varicosities, or cold extremities NEUROLOGICAL: Denies dizziness, syncope, numbness, or headache PSYCHIATRIC: Denies feeling of depression or anxiety. No report of hallucinations, insomnia, paranoia, or agitation    LABS/RADIOLOGY: Labs reviewed: Basic Metabolic Panel: Recent Labs    05/25/20 0834 11/21/20 1417  NA 137 126*  K 4.1 4.6  CL 102 94*  CO2 23 21  GLUCOSE 161* 121*  BUN 21 20  CREATININE 0.97* 0.99  CALCIUM 9.4 9.4   Liver Function Tests: Recent Labs    05/25/20 0834 11/21/20 1417  AST 15 20  ALT 15 13  BILITOT 0.4 0.7  PROT 6.6 7.0   CBC: Recent Labs    05/25/20 0834 11/21/20 1417  WBC 4.5 5.6  NEUTROABS 3,123 4,273  HGB 14.4 13.9  HCT 43.1 40.6  MCV 89.6 87.5  PLT 223 245   A1C: Invalid input(s): A1C Lipid  Panel: Recent Labs    05/25/20 0834 11/21/20 1417  HDL 52 50       ASSESSMENT/PLAN:   1. Upper respiratory tract infection due to COVID-19 virus  - molnupiravir EUA 200 mg CAPS; Take 4 capsules (800 mg total) by mouth 2 (two) times daily for 5 days.  Dispense: 40 capsule; Refill: 0 - doxycycline (MONODOX) 100 MG capsule; Take 1 capsule (100 mg total) by mouth 2 (two) times daily for 7 days.  Dispense: 14 capsule; Refill: 0 - saccharomyces boulardii (FLORASTOR) 250 MG capsule; Take 1  capsule (250 mg total) by mouth 2 (two) times daily for 10 days.  Dispense: 20 capsule; Refill: 0     Time spent on non face to face visit:   15 minutes  The patient gave consent to this telephone visit. Explained to the patient the risk and privacy issue that was involved with this telephone call.   The patient was advised to call back and ask for an in-person evaluation if the symptoms worsen or if the condition fails to improve.   Durenda Age, NP [facility] [phone]

## 2020-12-06 ENCOUNTER — Telehealth: Payer: Self-pay | Admitting: *Deleted

## 2020-12-06 NOTE — Telephone Encounter (Signed)
Patient called and stated that she spoke with you yesterday.  Stated that she thinks she has Strep Throat on top of the Bronchitis and wonders if the Doxycyline will cover that to.  Stated that she had a temp of 101 last night.  Also wanted to let you know that her blood pressure was high last night of 171/98 so she went back to bed and then got up and retook it and it was 138/81 so she took an extra Lisinopril. (She just wanted to let you know)  Please Advise.

## 2020-12-06 NOTE — Telephone Encounter (Signed)
Medina-Vargas, Monina C, NP  [facility] Clinical Pool 2 minutes ago (4:21 PM)   Doxycycline should cover her strep throat and bronchitis, She needs to log her BP and bring to office on her next visit. If BP is consistently high then she needs to bring her log to office and might need to adjust medications.       Patient notified and agreed.

## 2020-12-20 DIAGNOSIS — F310 Bipolar disorder, current episode hypomanic: Secondary | ICD-10-CM | POA: Diagnosis not present

## 2020-12-20 DIAGNOSIS — I10 Essential (primary) hypertension: Secondary | ICD-10-CM | POA: Diagnosis not present

## 2020-12-20 DIAGNOSIS — F4310 Post-traumatic stress disorder, unspecified: Secondary | ICD-10-CM | POA: Diagnosis not present

## 2020-12-20 DIAGNOSIS — M5124 Other intervertebral disc displacement, thoracic region: Secondary | ICD-10-CM | POA: Diagnosis not present

## 2020-12-20 DIAGNOSIS — F419 Anxiety disorder, unspecified: Secondary | ICD-10-CM | POA: Diagnosis not present

## 2020-12-20 DIAGNOSIS — E119 Type 2 diabetes mellitus without complications: Secondary | ICD-10-CM | POA: Diagnosis not present

## 2020-12-20 DIAGNOSIS — G8929 Other chronic pain: Secondary | ICD-10-CM | POA: Diagnosis not present

## 2020-12-20 DIAGNOSIS — M5021 Other cervical disc displacement,  high cervical region: Secondary | ICD-10-CM | POA: Diagnosis not present

## 2020-12-20 DIAGNOSIS — G4700 Insomnia, unspecified: Secondary | ICD-10-CM | POA: Diagnosis not present

## 2020-12-31 DIAGNOSIS — F3181 Bipolar II disorder: Secondary | ICD-10-CM | POA: Diagnosis not present

## 2021-01-01 DIAGNOSIS — F310 Bipolar disorder, current episode hypomanic: Secondary | ICD-10-CM | POA: Diagnosis not present

## 2021-01-01 DIAGNOSIS — M5124 Other intervertebral disc displacement, thoracic region: Secondary | ICD-10-CM | POA: Diagnosis not present

## 2021-01-01 DIAGNOSIS — G4700 Insomnia, unspecified: Secondary | ICD-10-CM | POA: Diagnosis not present

## 2021-01-01 DIAGNOSIS — M5021 Other cervical disc displacement,  high cervical region: Secondary | ICD-10-CM | POA: Diagnosis not present

## 2021-01-01 DIAGNOSIS — F419 Anxiety disorder, unspecified: Secondary | ICD-10-CM | POA: Diagnosis not present

## 2021-01-01 DIAGNOSIS — G8929 Other chronic pain: Secondary | ICD-10-CM | POA: Diagnosis not present

## 2021-01-01 DIAGNOSIS — F4310 Post-traumatic stress disorder, unspecified: Secondary | ICD-10-CM | POA: Diagnosis not present

## 2021-01-01 DIAGNOSIS — E119 Type 2 diabetes mellitus without complications: Secondary | ICD-10-CM | POA: Diagnosis not present

## 2021-01-01 DIAGNOSIS — I10 Essential (primary) hypertension: Secondary | ICD-10-CM | POA: Diagnosis not present

## 2021-01-03 ENCOUNTER — Other Ambulatory Visit: Payer: Self-pay | Admitting: Family

## 2021-01-03 DIAGNOSIS — J302 Other seasonal allergic rhinitis: Secondary | ICD-10-CM

## 2021-01-22 DIAGNOSIS — M5021 Other cervical disc displacement,  high cervical region: Secondary | ICD-10-CM | POA: Diagnosis not present

## 2021-01-22 DIAGNOSIS — G8929 Other chronic pain: Secondary | ICD-10-CM | POA: Diagnosis not present

## 2021-01-22 DIAGNOSIS — I10 Essential (primary) hypertension: Secondary | ICD-10-CM | POA: Diagnosis not present

## 2021-01-22 DIAGNOSIS — G4700 Insomnia, unspecified: Secondary | ICD-10-CM | POA: Diagnosis not present

## 2021-01-22 DIAGNOSIS — F4310 Post-traumatic stress disorder, unspecified: Secondary | ICD-10-CM | POA: Diagnosis not present

## 2021-01-22 DIAGNOSIS — M5124 Other intervertebral disc displacement, thoracic region: Secondary | ICD-10-CM | POA: Diagnosis not present

## 2021-01-22 DIAGNOSIS — F310 Bipolar disorder, current episode hypomanic: Secondary | ICD-10-CM | POA: Diagnosis not present

## 2021-01-22 DIAGNOSIS — E119 Type 2 diabetes mellitus without complications: Secondary | ICD-10-CM | POA: Diagnosis not present

## 2021-01-22 DIAGNOSIS — F419 Anxiety disorder, unspecified: Secondary | ICD-10-CM | POA: Diagnosis not present

## 2021-01-30 DIAGNOSIS — L602 Onychogryphosis: Secondary | ICD-10-CM | POA: Diagnosis not present

## 2021-01-30 DIAGNOSIS — L84 Corns and callosities: Secondary | ICD-10-CM | POA: Diagnosis not present

## 2021-01-30 DIAGNOSIS — E1351 Other specified diabetes mellitus with diabetic peripheral angiopathy without gangrene: Secondary | ICD-10-CM | POA: Diagnosis not present

## 2021-02-05 DIAGNOSIS — Z20822 Contact with and (suspected) exposure to covid-19: Secondary | ICD-10-CM | POA: Diagnosis not present

## 2021-02-05 DIAGNOSIS — R69 Illness, unspecified: Secondary | ICD-10-CM | POA: Diagnosis not present

## 2021-02-05 DIAGNOSIS — Z743 Need for continuous supervision: Secondary | ICD-10-CM | POA: Diagnosis not present

## 2021-02-05 DIAGNOSIS — K625 Hemorrhage of anus and rectum: Secondary | ICD-10-CM | POA: Diagnosis not present

## 2021-02-05 DIAGNOSIS — Z9889 Other specified postprocedural states: Secondary | ICD-10-CM | POA: Diagnosis not present

## 2021-02-05 DIAGNOSIS — D649 Anemia, unspecified: Secondary | ICD-10-CM

## 2021-02-05 DIAGNOSIS — F319 Bipolar disorder, unspecified: Secondary | ICD-10-CM | POA: Diagnosis not present

## 2021-02-05 DIAGNOSIS — I10 Essential (primary) hypertension: Secondary | ICD-10-CM | POA: Diagnosis not present

## 2021-02-05 DIAGNOSIS — K922 Gastrointestinal hemorrhage, unspecified: Secondary | ICD-10-CM

## 2021-02-05 HISTORY — DX: Gastrointestinal hemorrhage, unspecified: K92.2

## 2021-02-05 HISTORY — DX: Anemia, unspecified: D64.9

## 2021-02-06 DIAGNOSIS — T8189XA Other complications of procedures, not elsewhere classified, initial encounter: Secondary | ICD-10-CM | POA: Diagnosis not present

## 2021-02-06 DIAGNOSIS — R109 Unspecified abdominal pain: Secondary | ICD-10-CM | POA: Diagnosis not present

## 2021-02-06 DIAGNOSIS — F319 Bipolar disorder, unspecified: Secondary | ICD-10-CM | POA: Diagnosis not present

## 2021-02-06 DIAGNOSIS — R7303 Prediabetes: Secondary | ICD-10-CM | POA: Diagnosis not present

## 2021-02-06 DIAGNOSIS — Z1211 Encounter for screening for malignant neoplasm of colon: Secondary | ICD-10-CM | POA: Diagnosis not present

## 2021-02-06 DIAGNOSIS — R000 Tachycardia, unspecified: Secondary | ICD-10-CM | POA: Diagnosis not present

## 2021-02-06 DIAGNOSIS — T8144XA Sepsis following a procedure, initial encounter: Secondary | ICD-10-CM | POA: Diagnosis not present

## 2021-02-06 DIAGNOSIS — E669 Obesity, unspecified: Secondary | ICD-10-CM | POA: Diagnosis not present

## 2021-02-06 DIAGNOSIS — D125 Benign neoplasm of sigmoid colon: Secondary | ICD-10-CM | POA: Diagnosis not present

## 2021-02-06 DIAGNOSIS — R509 Fever, unspecified: Secondary | ICD-10-CM | POA: Diagnosis not present

## 2021-02-06 DIAGNOSIS — I959 Hypotension, unspecified: Secondary | ICD-10-CM | POA: Diagnosis not present

## 2021-02-06 DIAGNOSIS — Z9889 Other specified postprocedural states: Secondary | ICD-10-CM | POA: Diagnosis not present

## 2021-02-06 DIAGNOSIS — K648 Other hemorrhoids: Secondary | ICD-10-CM | POA: Diagnosis not present

## 2021-02-06 DIAGNOSIS — Z6834 Body mass index (BMI) 34.0-34.9, adult: Secondary | ICD-10-CM | POA: Diagnosis not present

## 2021-02-06 DIAGNOSIS — Z980 Intestinal bypass and anastomosis status: Secondary | ICD-10-CM | POA: Diagnosis not present

## 2021-02-06 DIAGNOSIS — D122 Benign neoplasm of ascending colon: Secondary | ICD-10-CM | POA: Diagnosis not present

## 2021-02-06 DIAGNOSIS — K922 Gastrointestinal hemorrhage, unspecified: Secondary | ICD-10-CM | POA: Diagnosis not present

## 2021-02-06 DIAGNOSIS — K824 Cholesterolosis of gallbladder: Secondary | ICD-10-CM | POA: Diagnosis not present

## 2021-02-06 DIAGNOSIS — A419 Sepsis, unspecified organism: Secondary | ICD-10-CM | POA: Diagnosis not present

## 2021-02-06 DIAGNOSIS — K921 Melena: Secondary | ICD-10-CM | POA: Diagnosis not present

## 2021-02-06 DIAGNOSIS — K5730 Diverticulosis of large intestine without perforation or abscess without bleeding: Secondary | ICD-10-CM | POA: Diagnosis not present

## 2021-02-06 DIAGNOSIS — I10 Essential (primary) hypertension: Secondary | ICD-10-CM | POA: Diagnosis not present

## 2021-02-06 DIAGNOSIS — K625 Hemorrhage of anus and rectum: Secondary | ICD-10-CM | POA: Diagnosis not present

## 2021-02-06 DIAGNOSIS — Z20822 Contact with and (suspected) exposure to covid-19: Secondary | ICD-10-CM | POA: Diagnosis not present

## 2021-02-06 DIAGNOSIS — K5731 Diverticulosis of large intestine without perforation or abscess with bleeding: Secondary | ICD-10-CM | POA: Diagnosis not present

## 2021-02-06 DIAGNOSIS — K449 Diaphragmatic hernia without obstruction or gangrene: Secondary | ICD-10-CM | POA: Diagnosis not present

## 2021-02-06 DIAGNOSIS — Z743 Need for continuous supervision: Secondary | ICD-10-CM | POA: Diagnosis not present

## 2021-02-06 DIAGNOSIS — D62 Acute posthemorrhagic anemia: Secondary | ICD-10-CM | POA: Diagnosis not present

## 2021-02-06 LAB — IRON,TIBC AND FERRITIN PANEL
Ferritin: 64
Iron: 88
TIBC: 282

## 2021-02-06 LAB — HEPATIC FUNCTION PANEL
Alkaline Phosphatase: 60 (ref 25–125)
Bilirubin, Direct: 0.1 (ref 0.01–0.4)
Bilirubin, Total: 0.2

## 2021-02-06 LAB — COMPREHENSIVE METABOLIC PANEL
Albumin: 3.4 — AB (ref 3.5–5.0)
Globulin: 1.6

## 2021-02-07 DIAGNOSIS — K922 Gastrointestinal hemorrhage, unspecified: Secondary | ICD-10-CM | POA: Diagnosis not present

## 2021-02-07 DIAGNOSIS — Z980 Intestinal bypass and anastomosis status: Secondary | ICD-10-CM | POA: Diagnosis not present

## 2021-02-07 DIAGNOSIS — Z1211 Encounter for screening for malignant neoplasm of colon: Secondary | ICD-10-CM | POA: Diagnosis not present

## 2021-02-07 DIAGNOSIS — K5730 Diverticulosis of large intestine without perforation or abscess without bleeding: Secondary | ICD-10-CM | POA: Diagnosis not present

## 2021-02-07 DIAGNOSIS — D125 Benign neoplasm of sigmoid colon: Secondary | ICD-10-CM | POA: Diagnosis not present

## 2021-02-07 DIAGNOSIS — I10 Essential (primary) hypertension: Secondary | ICD-10-CM | POA: Diagnosis not present

## 2021-02-07 DIAGNOSIS — K648 Other hemorrhoids: Secondary | ICD-10-CM | POA: Diagnosis not present

## 2021-02-07 DIAGNOSIS — K921 Melena: Secondary | ICD-10-CM | POA: Diagnosis not present

## 2021-02-07 DIAGNOSIS — E669 Obesity, unspecified: Secondary | ICD-10-CM | POA: Diagnosis not present

## 2021-02-07 DIAGNOSIS — D122 Benign neoplasm of ascending colon: Secondary | ICD-10-CM | POA: Diagnosis not present

## 2021-02-07 DIAGNOSIS — R7303 Prediabetes: Secondary | ICD-10-CM | POA: Diagnosis not present

## 2021-02-08 DIAGNOSIS — A419 Sepsis, unspecified organism: Secondary | ICD-10-CM | POA: Diagnosis not present

## 2021-02-08 DIAGNOSIS — R7303 Prediabetes: Secondary | ICD-10-CM | POA: Diagnosis not present

## 2021-02-08 DIAGNOSIS — R109 Unspecified abdominal pain: Secondary | ICD-10-CM | POA: Diagnosis not present

## 2021-02-08 DIAGNOSIS — K824 Cholesterolosis of gallbladder: Secondary | ICD-10-CM | POA: Diagnosis not present

## 2021-02-08 DIAGNOSIS — I959 Hypotension, unspecified: Secondary | ICD-10-CM | POA: Diagnosis not present

## 2021-02-08 DIAGNOSIS — K449 Diaphragmatic hernia without obstruction or gangrene: Secondary | ICD-10-CM | POA: Diagnosis not present

## 2021-02-08 DIAGNOSIS — I10 Essential (primary) hypertension: Secondary | ICD-10-CM | POA: Diagnosis not present

## 2021-02-08 DIAGNOSIS — E669 Obesity, unspecified: Secondary | ICD-10-CM | POA: Diagnosis not present

## 2021-02-08 DIAGNOSIS — K922 Gastrointestinal hemorrhage, unspecified: Secondary | ICD-10-CM | POA: Diagnosis not present

## 2021-02-08 DIAGNOSIS — R000 Tachycardia, unspecified: Secondary | ICD-10-CM | POA: Diagnosis not present

## 2021-02-08 DIAGNOSIS — R509 Fever, unspecified: Secondary | ICD-10-CM | POA: Diagnosis not present

## 2021-02-08 LAB — VITAMIN B12: Vitamin B-12: 392

## 2021-02-09 DIAGNOSIS — I10 Essential (primary) hypertension: Secondary | ICD-10-CM | POA: Diagnosis not present

## 2021-02-09 DIAGNOSIS — R7303 Prediabetes: Secondary | ICD-10-CM | POA: Diagnosis not present

## 2021-02-09 DIAGNOSIS — E669 Obesity, unspecified: Secondary | ICD-10-CM | POA: Diagnosis not present

## 2021-02-09 DIAGNOSIS — K922 Gastrointestinal hemorrhage, unspecified: Secondary | ICD-10-CM | POA: Diagnosis not present

## 2021-02-10 DIAGNOSIS — R7303 Prediabetes: Secondary | ICD-10-CM | POA: Diagnosis not present

## 2021-02-10 DIAGNOSIS — K922 Gastrointestinal hemorrhage, unspecified: Secondary | ICD-10-CM | POA: Diagnosis not present

## 2021-02-10 DIAGNOSIS — I10 Essential (primary) hypertension: Secondary | ICD-10-CM | POA: Diagnosis not present

## 2021-02-10 DIAGNOSIS — E669 Obesity, unspecified: Secondary | ICD-10-CM | POA: Diagnosis not present

## 2021-02-10 LAB — COMPREHENSIVE METABOLIC PANEL: Calcium: 8.4 — AB (ref 8.7–10.7)

## 2021-02-10 LAB — BASIC METABOLIC PANEL
BUN: 13 (ref 4–21)
CO2: 24 — AB (ref 13–22)
Chloride: 105 (ref 99–108)
Creatinine: 1 (ref 0.5–1.1)
Glucose: 209
Potassium: 4 (ref 3.4–5.3)
Sodium: 137 (ref 137–147)

## 2021-02-10 LAB — CBC AND DIFFERENTIAL
HCT: 22 — AB (ref 36–46)
Hemoglobin: 7.4 — AB (ref 12.0–16.0)
Platelets: 144 — AB (ref 150–399)
WBC: 3

## 2021-02-10 LAB — CBC: RBC: 2.44 — AB (ref 3.87–5.11)

## 2021-02-12 ENCOUNTER — Ambulatory Visit: Payer: Medicare HMO | Admitting: Audiologist

## 2021-02-14 DIAGNOSIS — F3181 Bipolar II disorder: Secondary | ICD-10-CM | POA: Diagnosis not present

## 2021-02-15 ENCOUNTER — Other Ambulatory Visit: Payer: Self-pay

## 2021-02-15 ENCOUNTER — Ambulatory Visit (INDEPENDENT_AMBULATORY_CARE_PROVIDER_SITE_OTHER): Payer: Medicare HMO | Admitting: Family Medicine

## 2021-02-15 ENCOUNTER — Encounter: Payer: Self-pay | Admitting: Family Medicine

## 2021-02-15 VITALS — BP 130/80 | HR 95 | Temp 96.7°F | Ht 63.0 in | Wt 175.6 lb

## 2021-02-15 DIAGNOSIS — D500 Iron deficiency anemia secondary to blood loss (chronic): Secondary | ICD-10-CM | POA: Diagnosis not present

## 2021-02-15 DIAGNOSIS — K922 Gastrointestinal hemorrhage, unspecified: Secondary | ICD-10-CM

## 2021-02-15 LAB — CBC
HCT: 29.2 % — ABNORMAL LOW (ref 35.0–45.0)
Hemoglobin: 9.5 g/dL — ABNORMAL LOW (ref 11.7–15.5)
MCH: 30.2 pg (ref 27.0–33.0)
MCHC: 32.5 g/dL (ref 32.0–36.0)
MCV: 92.7 fL (ref 80.0–100.0)
MPV: 10 fL (ref 7.5–12.5)
Platelets: 357 10*3/uL (ref 140–400)
RBC: 3.15 10*6/uL — ABNORMAL LOW (ref 3.80–5.10)
RDW: 12.8 % (ref 11.0–15.0)
WBC: 5.4 10*3/uL (ref 3.8–10.8)

## 2021-02-15 NOTE — Progress Notes (Signed)
cbc   Provider:  Alain Honey, MD  Careteam: Patient Care Team: Ngetich, [name], NP as PCP - General (Family Medicine) Brien Few, MD as Consulting Physician (Obstetrics and Gynecology) Katy Apo, MD as Consulting Physician (Ophthalmology)  PLACE OF SERVICE:  [place] Directive information    Allergies  Allergen Reactions   Aspirin Other (See Comments)   Other Other (See Comments)    "makes my urine turn brown" Other reaction(s): MUSCLE PAIN "makes my urine turn brown"   Oxycodone     Migraines   Statins Other (See Comments)    "makes my urine turn brown" Other reaction(s): MUSCLE PAIN "makes my urine turn brown"    No chief complaint on file.    HPI: Patient is a 70 y.o. female this nice lady presents after hospitalization in [place].  Dates 02/05/2021 through 02/10/2021 for GI bleed and anemia.  As I read the discharge summary diagnosis seem to be diverticular disease but on colonoscopy no active bleeding was seen.  CBC showed hemoglobin of 7.4 with slightly depressed white count.  She was febrile and was given empiric antibiotics allegedly for the fever.  Hemoglobin had been in the 13-14 range prior to this acute bleed.  Currently taking Feosol but not at a therapeutic dose which we discussed increasing Review of Systems:  Review of Systems  Constitutional:  Positive for malaise/fatigue.       Likely related to acute  Cardiovascular:  Negative for chest pain and palpitations.  Gastrointestinal:  Negative for abdominal pain, blood in stool, constipation, diarrhea, heartburn, melena, nausea and vomiting.  All other systems reviewed and are negative.  Past Medical History:  Diagnosis Date   Anxiety    Bipolar 1 disorder ([place])    Bulging of cervical intervertebral disc    C2   Bulging of thoracic intervertebral disc    t4   Depression    Diabetes mellitus without complication ([place])    Hyperlipidemia    PTSD (post-traumatic stress  disorder)    Sad    Past Surgical History:  Procedure Laterality Date   CATARACT EXTRACTION Right 08/11/2017   CATARACT EXTRACTION Left 09/01/2017   [place]   JOINT REPLACEMENT  2013   hip   Social History:   reports that she has never smoked. She has never used smokeless tobacco. She reports that she does not drink alcohol and does not use drugs.  Family History  Problem Relation Age of Onset   Ovarian cancer Mother    Lymphoma Father    Colon cancer Sister     Medications: Patient's Medications  New Prescriptions   No medications on file  Previous Medications   ACCU-CHEK FASTCLIX LANCETS MISC    Use to test blood sugar three times daily. Dx: E11.9   AMPHETAMINE-DEXTROAMPHETAMINE (ADDERALL XR) 25 MG 24 HR CAPSULE    Take 25 mg by mouth daily. Brand name ONLY   ARIPIPRAZOLE ER (ABILIFY MAINTENA) 300 MG PRSY PREFILLED SYRINGE    Inject 300 mg into the muscle every 30 (thirty) days. Last injection 10/28/2019 Brand Name ONLY   ASCORBIC ACID (VITAMIN C) 1000 MG TABLET    Take 1,000 mg by mouth daily.   ASTAXANTHIN PO    Take 12 mg by mouth daily.    BARBERRY-OREG GRAPE-GOLDENSEAL (BERBERINE COMPLEX PO)    Take by mouth. 500 mg 3-4 times daily   CLONAZEPAM (KLONOPIN) 1 MG TABLET    0.5 mg.  CONTINUOUS BLOOD GLUC RECEIVER (FREESTYLE LIBRE 2 READER) DEVI    1 Device by Does not apply route daily. E11.9   CONTINUOUS BLOOD GLUC SENSOR (FREESTYLE LIBRE 2 SENSOR) MISC    Use to test blood sugar three times daily. Dx: E11.9   CONTINUOUS GLUCOSE MONITOR SUP KIT    1 kit by Does not apply route 3 (three) times daily.   GABAPENTIN (NEURONTIN) 300 MG CAPSULE    TAKE 1 CAPSULE BY MOUTH THREE TIMES DAILY   LAMOTRIGINE (LAMICTAL) 200 MG TABLET    Take 200 mg by mouth 2 (two) times daily. Takes Brand Name ONLY   LISINOPRIL (ZESTRIL) 10 MG TABLET    TAKE 1 TABLET(10 MG) BY MOUTH DAILY   LORATADINE (CLARITIN) 10 MG TABLET    TAKE 1 TABLET(10 MG) BY MOUTH DAILY    METFORMIN (GLUCOPHAGE) 500 MG TABLET    Take 1 tablet (500 mg total) by mouth daily with breakfast.   NYSTATIN CREAM (MYCOSTATIN)    Apply 1 application topically 2 (two) times daily.   SERTRALINE (ZOLOFT) 25 MG TABLET    Take 25 mg by mouth daily.  Modified Medications   No medications on file  Discontinued Medications   No medications on file    Physical Exam:  There were no vitals filed for this visit. There is no height or weight on file to calculate BMI. Wt Readings from Last 3 Encounters:  12/05/20 178 lb (80.7 kg)  11/23/20 180 lb (81.6 kg)  09/21/20 190 lb 9.6 oz (86.5 kg)    Physical Exam Vitals and nursing note reviewed.  Constitutional:      Appearance: Normal appearance.  Cardiovascular:     Rate and Rhythm: Normal rate and regular rhythm.     Comments: There is no tachycardia as 1 might expect with acute blood loss making me think her anemia and drop in hemoglobin has been more gradual Pulmonary:     Effort: Pulmonary effort is normal.     Breath sounds: Normal breath sounds.  Abdominal:     General: Abdomen is flat. Bowel sounds are normal.     Palpations: Abdomen is soft.  Neurological:     Mental Status: She is alert.    Labs reviewed: Basic Metabolic Panel: Recent Labs    05/25/20 0834 11/21/20 1417  NA 137 126*  K 4.1 4.6  CL 102 94*  CO2 23 21  GLUCOSE 161* 121*  BUN 21 20  CREATININE 0.97* 0.99  CALCIUM 9.4 9.4  TSH 2.55 1.64   Liver Function Tests: Recent Labs    05/25/20 0834 11/21/20 1417  AST 15 20  ALT 15 13  BILITOT 0.4 0.7  PROT 6.6 7.0   No results for input(s): LIPASE, AMYLASE in the last 8760 hours. No results for input(s): AMMONIA in the last 8760 hours. CBC: Recent Labs    05/25/20 0834 11/21/20 1417  WBC 4.5 5.6  NEUTROABS 3,123 4,273  HGB 14.4 13.9  HCT 43.1 40.6  MCV 89.6 87.5  PLT 223 245   Lipid Panel: Recent Labs    05/25/20 0834 11/21/20 1417  CHOL 253* 279*  HDL 52 50  LDLCALC 163* 200*  TRIG  223* 138  CHOLHDL 4.9 5.6*   TSH: Recent Labs    05/25/20 0834 11/21/20 1417  TSH 2.55 1.64   A1C: Lab Results  Component Value Date   HGBA1C 6.0 (H) 11/21/2020     Assessment/Plan  1. Gastrointestinal hemorrhage, unspecified gastrointestinal hemorrhage type We will  recheck CBC today to make sure hemoglobin has not dropped further since discharge.  I am concerned that there was no definite diagnosis made except for diverticular disease.  This can be problematic.  If blood loss continues despite seeing no blood or having melena would recommend further evaluation here by GI - CBC (no diff)  2. Anemia due to blood loss I want her to take increased dose of iron supplement either sulfate fumarate or gluconate.  We will plan to repeat CBC in 1 month but I suspect it will take at least 2 to 4 weeks before hemoglobin has risen substantially.   Alain Honey, MD [facility] [phone]

## 2021-02-15 NOTE — Patient Instructions (Signed)
Continue to take iron (ferrous sulfate or gluconate or fumarate)

## 2021-02-18 ENCOUNTER — Other Ambulatory Visit: Payer: Medicare HMO

## 2021-02-26 ENCOUNTER — Ambulatory Visit: Payer: Medicare HMO | Admitting: Audiologist

## 2021-02-28 DIAGNOSIS — F4310 Post-traumatic stress disorder, unspecified: Secondary | ICD-10-CM | POA: Diagnosis not present

## 2021-02-28 DIAGNOSIS — M5021 Other cervical disc displacement,  high cervical region: Secondary | ICD-10-CM | POA: Diagnosis not present

## 2021-02-28 DIAGNOSIS — F310 Bipolar disorder, current episode hypomanic: Secondary | ICD-10-CM | POA: Diagnosis not present

## 2021-02-28 DIAGNOSIS — E119 Type 2 diabetes mellitus without complications: Secondary | ICD-10-CM | POA: Diagnosis not present

## 2021-02-28 DIAGNOSIS — M5124 Other intervertebral disc displacement, thoracic region: Secondary | ICD-10-CM | POA: Diagnosis not present

## 2021-02-28 DIAGNOSIS — G8929 Other chronic pain: Secondary | ICD-10-CM | POA: Diagnosis not present

## 2021-02-28 DIAGNOSIS — G4700 Insomnia, unspecified: Secondary | ICD-10-CM | POA: Diagnosis not present

## 2021-02-28 DIAGNOSIS — I10 Essential (primary) hypertension: Secondary | ICD-10-CM | POA: Diagnosis not present

## 2021-02-28 DIAGNOSIS — F419 Anxiety disorder, unspecified: Secondary | ICD-10-CM | POA: Diagnosis not present

## 2021-03-04 ENCOUNTER — Other Ambulatory Visit: Payer: Self-pay | Admitting: Adult Health

## 2021-03-04 DIAGNOSIS — I10 Essential (primary) hypertension: Secondary | ICD-10-CM

## 2021-03-04 DIAGNOSIS — E1169 Type 2 diabetes mellitus with other specified complication: Secondary | ICD-10-CM

## 2021-03-05 DIAGNOSIS — G8929 Other chronic pain: Secondary | ICD-10-CM | POA: Diagnosis not present

## 2021-03-05 DIAGNOSIS — F310 Bipolar disorder, current episode hypomanic: Secondary | ICD-10-CM | POA: Diagnosis not present

## 2021-03-05 DIAGNOSIS — E119 Type 2 diabetes mellitus without complications: Secondary | ICD-10-CM | POA: Diagnosis not present

## 2021-03-05 DIAGNOSIS — M5021 Other cervical disc displacement,  high cervical region: Secondary | ICD-10-CM | POA: Diagnosis not present

## 2021-03-05 DIAGNOSIS — F4310 Post-traumatic stress disorder, unspecified: Secondary | ICD-10-CM | POA: Diagnosis not present

## 2021-03-05 DIAGNOSIS — I10 Essential (primary) hypertension: Secondary | ICD-10-CM | POA: Diagnosis not present

## 2021-03-05 DIAGNOSIS — F419 Anxiety disorder, unspecified: Secondary | ICD-10-CM | POA: Diagnosis not present

## 2021-03-05 DIAGNOSIS — M5124 Other intervertebral disc displacement, thoracic region: Secondary | ICD-10-CM | POA: Diagnosis not present

## 2021-03-12 ENCOUNTER — Ambulatory Visit: Payer: Medicare HMO | Admitting: Audiologist

## 2021-03-15 DIAGNOSIS — F3181 Bipolar II disorder: Secondary | ICD-10-CM | POA: Diagnosis not present

## 2021-03-16 DIAGNOSIS — F3181 Bipolar II disorder: Secondary | ICD-10-CM | POA: Diagnosis not present

## 2021-03-19 ENCOUNTER — Ambulatory Visit: Payer: Medicare HMO | Admitting: Family Medicine

## 2021-03-19 ENCOUNTER — Ambulatory Visit: Payer: Medicare HMO | Admitting: Family

## 2021-03-27 ENCOUNTER — Ambulatory Visit (INDEPENDENT_AMBULATORY_CARE_PROVIDER_SITE_OTHER): Payer: Medicare HMO | Admitting: Family Medicine

## 2021-03-27 ENCOUNTER — Other Ambulatory Visit: Payer: Self-pay

## 2021-03-27 ENCOUNTER — Encounter: Payer: Self-pay | Admitting: Family Medicine

## 2021-03-27 VITALS — BP 160/98 | HR 105 | Temp 97.1°F | Ht 63.0 in | Wt 186.4 lb

## 2021-03-27 DIAGNOSIS — G8929 Other chronic pain: Secondary | ICD-10-CM | POA: Diagnosis not present

## 2021-03-27 DIAGNOSIS — F310 Bipolar disorder, current episode hypomanic: Secondary | ICD-10-CM | POA: Diagnosis not present

## 2021-03-27 DIAGNOSIS — D500 Iron deficiency anemia secondary to blood loss (chronic): Secondary | ICD-10-CM

## 2021-03-27 DIAGNOSIS — E119 Type 2 diabetes mellitus without complications: Secondary | ICD-10-CM | POA: Diagnosis not present

## 2021-03-27 DIAGNOSIS — E782 Mixed hyperlipidemia: Secondary | ICD-10-CM | POA: Diagnosis not present

## 2021-03-27 DIAGNOSIS — M5124 Other intervertebral disc displacement, thoracic region: Secondary | ICD-10-CM | POA: Diagnosis not present

## 2021-03-27 DIAGNOSIS — E785 Hyperlipidemia, unspecified: Secondary | ICD-10-CM

## 2021-03-27 DIAGNOSIS — I10 Essential (primary) hypertension: Secondary | ICD-10-CM | POA: Diagnosis not present

## 2021-03-27 DIAGNOSIS — R296 Repeated falls: Secondary | ICD-10-CM | POA: Diagnosis not present

## 2021-03-27 DIAGNOSIS — E1169 Type 2 diabetes mellitus with other specified complication: Secondary | ICD-10-CM | POA: Diagnosis not present

## 2021-03-27 DIAGNOSIS — F419 Anxiety disorder, unspecified: Secondary | ICD-10-CM | POA: Diagnosis not present

## 2021-03-27 DIAGNOSIS — G4700 Insomnia, unspecified: Secondary | ICD-10-CM | POA: Diagnosis not present

## 2021-03-27 DIAGNOSIS — F4310 Post-traumatic stress disorder, unspecified: Secondary | ICD-10-CM | POA: Diagnosis not present

## 2021-03-27 DIAGNOSIS — M5021 Other cervical disc displacement,  high cervical region: Secondary | ICD-10-CM | POA: Diagnosis not present

## 2021-03-27 NOTE — Progress Notes (Signed)
Provider:  Alain Honey, MD  Careteam: Patient Care Team: Ngetich, [name], NP as PCP - General (Family Medicine) Brien Few, MD as Consulting Physician (Obstetrics and Gynecology) Katy Apo, MD as Consulting Physician (Ophthalmology)  PLACE OF SERVICE:  [place] Directive information    Allergies  Allergen Reactions   Aspirin Other (See Comments)   Other Other (See Comments)    "makes my urine turn brown" Other reaction(s): MUSCLE PAIN "makes my urine turn brown"   Oxycodone     Migraines   Statins Other (See Comments)    "makes my urine turn brown" Other reaction(s): MUSCLE PAIN "makes my urine turn brown"    No chief complaint on file.    HPI: Patient is a 70 y.o. female patient is here to follow-up her anemia.  She was seen about a month ago.  She had had what was thought to be a diverticular bleed and treated at a hospital in [place].  Last month I increased her iron to 2 tablets a day.  At that visit the focus was primarily on the anemia but she also has bipolar disorder, diabetes, hyperlipidemia, and hypertension. She had gone off of her diet for diabetes and has gained about 10 pounds in the last month.  She forgot to take her blood pressure meds this morning and blood pressures consequently elevated although at her last visit it was well controlled.  On the subject of her lipids her LDL is 200.  She cannot tolerate statins and does not want to treat her cholesterol.  I do think if she is successful in losing weight which we spent some time talking about today this should help her cholesterol levels.  Review of Systems:  Review of Systems  Constitutional: Negative.   Eyes: Negative.   Respiratory: Negative.    Cardiovascular: Negative.   Musculoskeletal:  Positive for joint pain.       Bilateral knee  Neurological: Negative.   All other systems reviewed and are negative.  Past Medical History:  Diagnosis Date   Anemia  02/05/2021   Anxiety    Bipolar 1 disorder ([place])    Bulging of cervical intervertebral disc    C2   Bulging of thoracic intervertebral disc    t4   Depression    Diabetes mellitus without complication ([place])    GI bleed 02/05/2021   Hyperlipidemia    PTSD (post-traumatic stress disorder)    Sad    Past Surgical History:  Procedure Laterality Date   CATARACT EXTRACTION Right 08/11/2017   CATARACT EXTRACTION Left 09/01/2017   [place]   JOINT REPLACEMENT  2013   hip   Social History:   reports that she has never smoked. She has never used smokeless tobacco. She reports that she does not drink alcohol and does not use drugs.  Family History  Problem Relation Age of Onset   Ovarian cancer Mother    Lymphoma Father    Colon cancer Sister     Medications: Patient's Medications  New Prescriptions   No medications on file  Previous Medications   ACCU-CHEK FASTCLIX LANCETS MISC    Use to test blood sugar three times daily. Dx: E11.9   AMPHETAMINE-DEXTROAMPHETAMINE (ADDERALL XR) 25 MG 24 HR CAPSULE    Take 25 mg by mouth daily. Brand name ONLY   ARIPIPRAZOLE ER (ABILIFY MAINTENA) 300 MG PRSY PREFILLED SYRINGE    Inject 300 mg into the muscle  every 30 (thirty) days. Last injection 10/28/2019 Brand Name ONLY   ASCORBIC ACID (VITAMIN C) 1000 MG TABLET    Take 1,000 mg by mouth daily.   ASTAXANTHIN PO    Take 12 mg by mouth daily.    BARBERRY-OREG GRAPE-GOLDENSEAL (BERBERINE COMPLEX PO)    Take by mouth. 500 mg 3-4 times daily   CLONAZEPAM (KLONOPIN) 1 MG TABLET    0.5 mg.   CONTINUOUS BLOOD GLUC SENSOR (FREESTYLE LIBRE 2 SENSOR) MISC    Use to test blood sugar three times daily. Dx: E11.9   GABAPENTIN (NEURONTIN) 300 MG CAPSULE    TAKE 1 CAPSULE BY MOUTH THREE TIMES DAILY   LAMOTRIGINE (LAMICTAL) 200 MG TABLET    Take 200 mg by mouth 2 (two) times daily. Takes Brand Name ONLY   LISINOPRIL (ZESTRIL) 10 MG TABLET    TAKE 1 TABLET(10 MG) BY MOUTH DAILY    LORATADINE (CLARITIN) 10 MG TABLET    TAKE 1 TABLET(10 MG) BY MOUTH DAILY   METFORMIN (GLUCOPHAGE) 500 MG TABLET    Take 1 tablet (500 mg total) by mouth daily with breakfast.   NYSTATIN CREAM (MYCOSTATIN)    Apply 1 application topically 2 (two) times daily.   SERTRALINE (ZOLOFT) 25 MG TABLET    Take 25 mg by mouth daily.  Modified Medications   No medications on file  Discontinued Medications   No medications on file    Physical Exam:  There were no vitals filed for this visit. There is no height or weight on file to calculate BMI. Wt Readings from Last 3 Encounters:  02/15/21 175 lb 9.6 oz (79.7 kg)  12/05/20 178 lb (80.7 kg)  11/23/20 180 lb (81.6 kg)    Physical Exam Vitals and nursing note reviewed.  Cardiovascular:     Rate and Rhythm: Normal rate and regular rhythm.  Pulmonary:     Effort: Pulmonary effort is normal.     Breath sounds: Normal breath sounds.  Abdominal:     General: Abdomen is flat. Bowel sounds are normal.     Palpations: Abdomen is soft. There is no mass.     Tenderness: There is no abdominal tenderness.  Musculoskeletal:        General: Normal range of motion.  Neurological:     General: No focal deficit present.     Mental Status: She is alert and oriented to person, place, and time.    Labs reviewed: Basic Metabolic Panel: Recent Labs    05/25/20 0834 11/21/20 1417 02/10/21 0000  NA 137 126* 137  K 4.1 4.6 4.0  CL 102 94* 105  CO2 23 21 24*  GLUCOSE 161* 121*  --   BUN 21 20 13   CREATININE 0.97* 0.99 1.0  CALCIUM 9.4 9.4 8.4*  TSH 2.55 1.64  --    Liver Function Tests: Recent Labs    05/25/20 0834 11/21/20 1417 02/06/21 0000  AST 15 20  --   ALT 15 13  --   ALKPHOS  --   --  60  BILITOT 0.4 0.7  --   PROT 6.6 7.0  --   ALBUMIN  --   --  3.4*   No results for input(s): LIPASE, AMYLASE in the last 8760 hours. No results for input(s): AMMONIA in the last 8760 hours. CBC: Recent Labs    05/25/20 0834 11/21/20 1417  02/10/21 0000 02/15/21 1105  WBC 4.5 5.6 3.0 5.4  NEUTROABS 3,123 4,273  --   --  HGB 14.4 13.9 7.4* 9.5*  HCT 43.1 40.6 22* 29.2*  MCV 89.6 87.5  --  92.7  PLT 223 245 144* 357   Lipid Panel: Recent Labs    05/25/20 0834 11/21/20 1417  CHOL 253* 279*  HDL 52 50  LDLCALC 163* 200*  TRIG 223* 138  CHOLHDL 4.9 5.6*   TSH: Recent Labs    05/25/20 0834 11/21/20 1417  TSH 2.55 1.64   A1C: Lab Results  Component Value Date   HGBA1C 6.0 (H) 11/21/2020     Assessment/Plan  1. Anemia due to blood loss Has been on iron preparation  2. Type 2 diabetes mellitus with other specified complication, without long-term current use of insulin (HCC) A1c's have generally been good in the 6 range on metformin but with gait weight gain and going off her diet would expect A1c to be higher today  3. Bipolar affective disorder, current episode hypo Patient takes injection monthly.  Stressed importance of maintaining that medication 4. Essential hypertension Forgot medicine today but blood pressure is usually better controlled.  Stressed importance of taking med every day  5. Frequent falls No recent falls but did go over knee bend exercises to help strengthen core and quads and hamstrings  6. Mixed hyperlipidemia Declines to take lipid-lowering medicine but I did bring up the possibility of using is a 10 B to lower cholesterol     Alain Honey, MD [facility] [phone]

## 2021-03-28 ENCOUNTER — Ambulatory Visit: Payer: Medicare HMO | Attending: Family | Admitting: Audiologist

## 2021-03-28 DIAGNOSIS — H903 Sensorineural hearing loss, bilateral: Secondary | ICD-10-CM | POA: Insufficient documentation

## 2021-03-28 LAB — CBC
HCT: 40.2 % (ref 35.0–45.0)
Hemoglobin: 13.1 g/dL (ref 11.7–15.5)
MCH: 29.9 pg (ref 27.0–33.0)
MCHC: 32.6 g/dL (ref 32.0–36.0)
MCV: 91.8 fL (ref 80.0–100.0)
MPV: 10.3 fL (ref 7.5–12.5)
Platelets: 247 10*3/uL (ref 140–400)
RBC: 4.38 10*6/uL (ref 3.80–5.10)
RDW: 12 % (ref 11.0–15.0)
WBC: 5.2 10*3/uL (ref 3.8–10.8)

## 2021-03-28 LAB — HEMOGLOBIN A1C
Hgb A1c MFr Bld: 5.8 % of total Hgb — ABNORMAL HIGH (ref <5.7)
Mean Plasma Glucose: 120 mg/dL
eAG (mmol/L): 6.6 mmol/L

## 2021-03-28 NOTE — Procedures (Signed)
°  [facility] [place], [place]  [id number] [phone]  AUDIOLOGICAL  EVALUATION  NAME: Kaitlyn Wood     DOB:   [birth year]      MRN: [id number]                                                                                     DATE: 03/28/2021     REFERENT: Sandrea Hughs, NP STATUS: Outpatient DIAGNOSIS: Sensorineural Hearing Loss Bilateral    History: [name] was seen for an audiological evaluation.  [name] is receiving a hearing evaluation due to concerns for difficulty hearing her family. [name] has difficulty hearing in background noise, crowds, and reverberant spaces. This difficulty began gradually. No pain or pressure reported in either ear. Tinnitus present in both ears sounding like a high pitched hum. [name] has no history of noise exposure.  Medical history positive for diabetes which is a risk factor for hearing loss. No other relevant case history reported.   Evaluation:  Otoscopy showed a clear view of the tympanic membranes, bilaterally Tympanometry results were consistent with normal middle ear function Audiometric testing was completed using conventional audiometry with insert and supraural transducer. Speech Recognition Thresholds were consistent with pure tone averages. Word Recognition was good in each ear at an elevated level of 40dB SL with masking. Pure tone thresholds show normal sloping to moderately severe hearing loss in both ears. Test results are consistent with slight conductive component in the right ear at 3k Hz only with air bone gap of 15dB.   Results:  The test results were reviewed with [name], she was counseled on the nature and degree of her hearing loss. She was provided with several copies of her audiogram that illustrate her degree of hearing loss in both ears. The hearing loss is in the high frequencies preventing [name] from hearing high frequency consonants such as /s/ /sh/ /f/ /t/ and /th/. These  sounds help differentiate the words she hears. Without these sounds, speech is muffled and unclear unless someone is face to face within 5 feet without a mask. [name] is an excellent hearing aid candidate. With hearing aids the clarity of speech will improve and [name] will not have to work so hard to hear. [name]  was provided with a list of audiologists that dispense hearing aids.   Recommendations: Amplification is necessary for both ears. Hearing aids can be purchased from a variety of locations. See provided list for locations in the [place] area.    Alfonse Alpers  Audiologist, Au.D., CCC-A 03/28/2021  2:45 PM  Cc: Sandrea Hughs, NP

## 2021-04-08 LAB — HM DIABETES EYE EXAM

## 2021-05-03 DIAGNOSIS — M5021 Other cervical disc displacement,  high cervical region: Secondary | ICD-10-CM | POA: Diagnosis not present

## 2021-05-03 DIAGNOSIS — F419 Anxiety disorder, unspecified: Secondary | ICD-10-CM | POA: Diagnosis not present

## 2021-05-03 DIAGNOSIS — F310 Bipolar disorder, current episode hypomanic: Secondary | ICD-10-CM | POA: Diagnosis not present

## 2021-05-03 DIAGNOSIS — G4700 Insomnia, unspecified: Secondary | ICD-10-CM | POA: Diagnosis not present

## 2021-05-03 DIAGNOSIS — F4310 Post-traumatic stress disorder, unspecified: Secondary | ICD-10-CM | POA: Diagnosis not present

## 2021-05-03 DIAGNOSIS — G8929 Other chronic pain: Secondary | ICD-10-CM | POA: Diagnosis not present

## 2021-05-03 DIAGNOSIS — I10 Essential (primary) hypertension: Secondary | ICD-10-CM | POA: Diagnosis not present

## 2021-05-03 DIAGNOSIS — M5124 Other intervertebral disc displacement, thoracic region: Secondary | ICD-10-CM | POA: Diagnosis not present

## 2021-05-03 DIAGNOSIS — E119 Type 2 diabetes mellitus without complications: Secondary | ICD-10-CM | POA: Diagnosis not present

## 2021-05-04 DIAGNOSIS — E119 Type 2 diabetes mellitus without complications: Secondary | ICD-10-CM | POA: Diagnosis not present

## 2021-05-04 DIAGNOSIS — M5124 Other intervertebral disc displacement, thoracic region: Secondary | ICD-10-CM | POA: Diagnosis not present

## 2021-05-04 DIAGNOSIS — I10 Essential (primary) hypertension: Secondary | ICD-10-CM | POA: Diagnosis not present

## 2021-05-04 DIAGNOSIS — F310 Bipolar disorder, current episode hypomanic: Secondary | ICD-10-CM | POA: Diagnosis not present

## 2021-05-04 DIAGNOSIS — F419 Anxiety disorder, unspecified: Secondary | ICD-10-CM | POA: Diagnosis not present

## 2021-05-04 DIAGNOSIS — M5021 Other cervical disc displacement,  high cervical region: Secondary | ICD-10-CM | POA: Diagnosis not present

## 2021-05-04 DIAGNOSIS — G8929 Other chronic pain: Secondary | ICD-10-CM | POA: Diagnosis not present

## 2021-05-04 DIAGNOSIS — F4310 Post-traumatic stress disorder, unspecified: Secondary | ICD-10-CM | POA: Diagnosis not present

## 2021-05-10 DIAGNOSIS — F3181 Bipolar II disorder: Secondary | ICD-10-CM | POA: Diagnosis not present

## 2021-05-15 ENCOUNTER — Other Ambulatory Visit: Payer: Self-pay | Admitting: Family

## 2021-05-15 DIAGNOSIS — G894 Chronic pain syndrome: Secondary | ICD-10-CM

## 2021-05-17 ENCOUNTER — Other Ambulatory Visit: Payer: Self-pay | Admitting: Family

## 2021-05-24 DIAGNOSIS — F3181 Bipolar II disorder: Secondary | ICD-10-CM | POA: Diagnosis not present

## 2021-05-27 DIAGNOSIS — F310 Bipolar disorder, current episode hypomanic: Secondary | ICD-10-CM | POA: Diagnosis not present

## 2021-05-27 DIAGNOSIS — G4700 Insomnia, unspecified: Secondary | ICD-10-CM | POA: Diagnosis not present

## 2021-05-27 DIAGNOSIS — I10 Essential (primary) hypertension: Secondary | ICD-10-CM | POA: Diagnosis not present

## 2021-05-27 DIAGNOSIS — E119 Type 2 diabetes mellitus without complications: Secondary | ICD-10-CM | POA: Diagnosis not present

## 2021-05-27 DIAGNOSIS — F4310 Post-traumatic stress disorder, unspecified: Secondary | ICD-10-CM | POA: Diagnosis not present

## 2021-05-27 DIAGNOSIS — M5021 Other cervical disc displacement,  high cervical region: Secondary | ICD-10-CM | POA: Diagnosis not present

## 2021-05-27 DIAGNOSIS — G8929 Other chronic pain: Secondary | ICD-10-CM | POA: Diagnosis not present

## 2021-05-27 DIAGNOSIS — M5124 Other intervertebral disc displacement, thoracic region: Secondary | ICD-10-CM | POA: Diagnosis not present

## 2021-05-27 DIAGNOSIS — F419 Anxiety disorder, unspecified: Secondary | ICD-10-CM | POA: Diagnosis not present

## 2021-06-21 DIAGNOSIS — F3181 Bipolar II disorder: Secondary | ICD-10-CM | POA: Diagnosis not present

## 2021-06-27 DIAGNOSIS — G4700 Insomnia, unspecified: Secondary | ICD-10-CM | POA: Diagnosis not present

## 2021-06-27 DIAGNOSIS — G8929 Other chronic pain: Secondary | ICD-10-CM | POA: Diagnosis not present

## 2021-06-27 DIAGNOSIS — E119 Type 2 diabetes mellitus without complications: Secondary | ICD-10-CM | POA: Diagnosis not present

## 2021-06-27 DIAGNOSIS — F310 Bipolar disorder, current episode hypomanic: Secondary | ICD-10-CM | POA: Diagnosis not present

## 2021-06-27 DIAGNOSIS — F419 Anxiety disorder, unspecified: Secondary | ICD-10-CM | POA: Diagnosis not present

## 2021-06-27 DIAGNOSIS — M5021 Other cervical disc displacement,  high cervical region: Secondary | ICD-10-CM | POA: Diagnosis not present

## 2021-06-27 DIAGNOSIS — I10 Essential (primary) hypertension: Secondary | ICD-10-CM | POA: Diagnosis not present

## 2021-06-27 DIAGNOSIS — F4310 Post-traumatic stress disorder, unspecified: Secondary | ICD-10-CM | POA: Diagnosis not present

## 2021-06-27 DIAGNOSIS — M5124 Other intervertebral disc displacement, thoracic region: Secondary | ICD-10-CM | POA: Diagnosis not present

## 2021-07-01 DIAGNOSIS — I10 Essential (primary) hypertension: Secondary | ICD-10-CM | POA: Diagnosis not present

## 2021-07-01 DIAGNOSIS — M5124 Other intervertebral disc displacement, thoracic region: Secondary | ICD-10-CM | POA: Diagnosis not present

## 2021-07-01 DIAGNOSIS — E119 Type 2 diabetes mellitus without complications: Secondary | ICD-10-CM | POA: Diagnosis not present

## 2021-07-01 DIAGNOSIS — M5021 Other cervical disc displacement,  high cervical region: Secondary | ICD-10-CM | POA: Diagnosis not present

## 2021-07-01 DIAGNOSIS — F419 Anxiety disorder, unspecified: Secondary | ICD-10-CM | POA: Diagnosis not present

## 2021-07-01 DIAGNOSIS — F310 Bipolar disorder, current episode hypomanic: Secondary | ICD-10-CM | POA: Diagnosis not present

## 2021-07-01 DIAGNOSIS — G4700 Insomnia, unspecified: Secondary | ICD-10-CM | POA: Diagnosis not present

## 2021-07-01 DIAGNOSIS — G8929 Other chronic pain: Secondary | ICD-10-CM | POA: Diagnosis not present

## 2021-07-01 DIAGNOSIS — F4310 Post-traumatic stress disorder, unspecified: Secondary | ICD-10-CM | POA: Diagnosis not present

## 2021-07-16 DIAGNOSIS — M5021 Other cervical disc displacement,  high cervical region: Secondary | ICD-10-CM | POA: Diagnosis not present

## 2021-07-16 DIAGNOSIS — I10 Essential (primary) hypertension: Secondary | ICD-10-CM | POA: Diagnosis not present

## 2021-07-16 DIAGNOSIS — F419 Anxiety disorder, unspecified: Secondary | ICD-10-CM | POA: Diagnosis not present

## 2021-07-16 DIAGNOSIS — F310 Bipolar disorder, current episode hypomanic: Secondary | ICD-10-CM | POA: Diagnosis not present

## 2021-07-16 DIAGNOSIS — E119 Type 2 diabetes mellitus without complications: Secondary | ICD-10-CM | POA: Diagnosis not present

## 2021-07-16 DIAGNOSIS — G8929 Other chronic pain: Secondary | ICD-10-CM | POA: Diagnosis not present

## 2021-07-16 DIAGNOSIS — F4310 Post-traumatic stress disorder, unspecified: Secondary | ICD-10-CM | POA: Diagnosis not present

## 2021-07-16 DIAGNOSIS — M5124 Other intervertebral disc displacement, thoracic region: Secondary | ICD-10-CM | POA: Diagnosis not present

## 2021-07-25 DIAGNOSIS — M5021 Other cervical disc displacement,  high cervical region: Secondary | ICD-10-CM | POA: Diagnosis not present

## 2021-07-25 DIAGNOSIS — G8929 Other chronic pain: Secondary | ICD-10-CM | POA: Diagnosis not present

## 2021-07-25 DIAGNOSIS — M5124 Other intervertebral disc displacement, thoracic region: Secondary | ICD-10-CM | POA: Diagnosis not present

## 2021-07-25 DIAGNOSIS — G4700 Insomnia, unspecified: Secondary | ICD-10-CM | POA: Diagnosis not present

## 2021-07-25 DIAGNOSIS — F4310 Post-traumatic stress disorder, unspecified: Secondary | ICD-10-CM | POA: Diagnosis not present

## 2021-07-25 DIAGNOSIS — F419 Anxiety disorder, unspecified: Secondary | ICD-10-CM | POA: Diagnosis not present

## 2021-07-25 DIAGNOSIS — F310 Bipolar disorder, current episode hypomanic: Secondary | ICD-10-CM | POA: Diagnosis not present

## 2021-07-25 DIAGNOSIS — I10 Essential (primary) hypertension: Secondary | ICD-10-CM | POA: Diagnosis not present

## 2021-07-25 DIAGNOSIS — E119 Type 2 diabetes mellitus without complications: Secondary | ICD-10-CM | POA: Diagnosis not present

## 2021-07-31 ENCOUNTER — Ambulatory Visit: Payer: Medicare HMO | Admitting: Family

## 2021-08-09 ENCOUNTER — Other Ambulatory Visit: Payer: Self-pay | Admitting: Family

## 2021-08-09 DIAGNOSIS — E1169 Type 2 diabetes mellitus with other specified complication: Secondary | ICD-10-CM

## 2021-08-09 DIAGNOSIS — I10 Essential (primary) hypertension: Secondary | ICD-10-CM

## 2021-08-16 DIAGNOSIS — F3181 Bipolar II disorder: Secondary | ICD-10-CM | POA: Diagnosis not present

## 2021-08-19 DIAGNOSIS — R2232 Localized swelling, mass and lump, left upper limb: Secondary | ICD-10-CM | POA: Diagnosis not present

## 2021-08-27 ENCOUNTER — Other Ambulatory Visit: Payer: Self-pay | Admitting: Orthopedic Surgery

## 2021-08-27 DIAGNOSIS — E119 Type 2 diabetes mellitus without complications: Secondary | ICD-10-CM | POA: Diagnosis not present

## 2021-08-27 DIAGNOSIS — F419 Anxiety disorder, unspecified: Secondary | ICD-10-CM | POA: Diagnosis not present

## 2021-08-27 DIAGNOSIS — G8929 Other chronic pain: Secondary | ICD-10-CM | POA: Diagnosis not present

## 2021-08-27 DIAGNOSIS — F310 Bipolar disorder, current episode hypomanic: Secondary | ICD-10-CM | POA: Diagnosis not present

## 2021-08-27 DIAGNOSIS — F4310 Post-traumatic stress disorder, unspecified: Secondary | ICD-10-CM | POA: Diagnosis not present

## 2021-08-27 DIAGNOSIS — G4700 Insomnia, unspecified: Secondary | ICD-10-CM | POA: Diagnosis not present

## 2021-08-27 DIAGNOSIS — R2232 Localized swelling, mass and lump, left upper limb: Secondary | ICD-10-CM

## 2021-08-27 DIAGNOSIS — M5021 Other cervical disc displacement,  high cervical region: Secondary | ICD-10-CM | POA: Diagnosis not present

## 2021-08-27 DIAGNOSIS — I10 Essential (primary) hypertension: Secondary | ICD-10-CM | POA: Diagnosis not present

## 2021-08-27 DIAGNOSIS — M5124 Other intervertebral disc displacement, thoracic region: Secondary | ICD-10-CM | POA: Diagnosis not present

## 2021-08-29 ENCOUNTER — Encounter: Payer: Self-pay | Admitting: Family

## 2021-08-29 DIAGNOSIS — E119 Type 2 diabetes mellitus without complications: Secondary | ICD-10-CM | POA: Diagnosis not present

## 2021-08-29 DIAGNOSIS — Z961 Presence of intraocular lens: Secondary | ICD-10-CM | POA: Diagnosis not present

## 2021-08-29 DIAGNOSIS — H26491 Other secondary cataract, right eye: Secondary | ICD-10-CM | POA: Diagnosis not present

## 2021-09-03 ENCOUNTER — Ambulatory Visit
Admission: RE | Admit: 2021-09-03 | Discharge: 2021-09-03 | Disposition: A | Discharge status: Home / Self Care | Payer: Medicare HMO | Source: Ambulatory Visit | Attending: Orthopedic Surgery | Admitting: Orthopedic Surgery

## 2021-09-03 DIAGNOSIS — R2232 Localized swelling, mass and lump, left upper limb: Secondary | ICD-10-CM

## 2021-09-06 ENCOUNTER — Observation Stay (HOSPITAL_COMMUNITY)
Admission: EM | Admit: 2021-09-06 | Discharge: 2021-09-07 | Disposition: A | Discharge status: Home / Self Care | Payer: Medicare HMO | Attending: Internal Medicine | Admitting: Internal Medicine

## 2021-09-06 ENCOUNTER — Emergency Department (HOSPITAL_COMMUNITY): Payer: Medicare HMO

## 2021-09-06 ENCOUNTER — Encounter (HOSPITAL_COMMUNITY): Payer: Self-pay

## 2021-09-06 ENCOUNTER — Other Ambulatory Visit: Payer: Self-pay

## 2021-09-06 DIAGNOSIS — R2689 Other abnormalities of gait and mobility: Secondary | ICD-10-CM | POA: Insufficient documentation

## 2021-09-06 DIAGNOSIS — I6381 Other cerebral infarction due to occlusion or stenosis of small artery: Secondary | ICD-10-CM | POA: Diagnosis not present

## 2021-09-06 DIAGNOSIS — Z96649 Presence of unspecified artificial hip joint: Secondary | ICD-10-CM | POA: Insufficient documentation

## 2021-09-06 DIAGNOSIS — Z7984 Long term (current) use of oral hypoglycemic drugs: Secondary | ICD-10-CM | POA: Insufficient documentation

## 2021-09-06 DIAGNOSIS — R42 Dizziness and giddiness: Secondary | ICD-10-CM

## 2021-09-06 DIAGNOSIS — Z79899 Other long term (current) drug therapy: Secondary | ICD-10-CM | POA: Diagnosis not present

## 2021-09-06 DIAGNOSIS — F319 Bipolar disorder, unspecified: Secondary | ICD-10-CM | POA: Diagnosis present

## 2021-09-06 DIAGNOSIS — I10 Essential (primary) hypertension: Secondary | ICD-10-CM | POA: Insufficient documentation

## 2021-09-06 DIAGNOSIS — H539 Unspecified visual disturbance: Secondary | ICD-10-CM

## 2021-09-06 DIAGNOSIS — F4310 Post-traumatic stress disorder, unspecified: Secondary | ICD-10-CM | POA: Diagnosis present

## 2021-09-06 DIAGNOSIS — H532 Diplopia: Secondary | ICD-10-CM | POA: Diagnosis not present

## 2021-09-06 DIAGNOSIS — R29818 Other symptoms and signs involving the nervous system: Secondary | ICD-10-CM | POA: Diagnosis not present

## 2021-09-06 DIAGNOSIS — H538 Other visual disturbances: Secondary | ICD-10-CM | POA: Diagnosis not present

## 2021-09-06 DIAGNOSIS — R519 Headache, unspecified: Secondary | ICD-10-CM | POA: Diagnosis present

## 2021-09-06 DIAGNOSIS — K219 Gastro-esophageal reflux disease without esophagitis: Secondary | ICD-10-CM

## 2021-09-06 DIAGNOSIS — E119 Type 2 diabetes mellitus without complications: Secondary | ICD-10-CM | POA: Diagnosis not present

## 2021-09-06 DIAGNOSIS — G459 Transient cerebral ischemic attack, unspecified: Secondary | ICD-10-CM | POA: Diagnosis not present

## 2021-09-06 LAB — CBC WITH DIFFERENTIAL/PLATELET
Abs Immature Granulocytes: 0.01 10*3/uL (ref 0.00–0.07)
Basophils Absolute: 0 10*3/uL (ref 0.0–0.1)
Basophils Relative: 0 %
Eosinophils Absolute: 0 10*3/uL (ref 0.0–0.5)
Eosinophils Relative: 0 %
HCT: 37 % (ref 36.0–46.0)
Hemoglobin: 12.3 g/dL (ref 12.0–15.0)
Immature Granulocytes: 0 %
Lymphocytes Relative: 17 %
Lymphs Abs: 0.7 10*3/uL (ref 0.7–4.0)
MCH: 29.6 pg (ref 26.0–34.0)
MCHC: 33.2 g/dL (ref 30.0–36.0)
MCV: 89.2 fL (ref 80.0–100.0)
Monocytes Absolute: 0.2 10*3/uL (ref 0.1–1.0)
Monocytes Relative: 6 %
Neutro Abs: 3.1 10*3/uL (ref 1.7–7.7)
Neutrophils Relative %: 77 %
Platelets: 186 10*3/uL (ref 150–400)
RBC: 4.15 MIL/uL (ref 3.87–5.11)
RDW: 12.6 % (ref 11.5–15.5)
WBC: 4.1 10*3/uL (ref 4.0–10.5)
nRBC: 0 % (ref 0.0–0.2)

## 2021-09-06 LAB — COMPREHENSIVE METABOLIC PANEL
ALT: 12 U/L (ref 0–44)
AST: 14 U/L — ABNORMAL LOW (ref 15–41)
Albumin: 3.6 g/dL (ref 3.5–5.0)
Alkaline Phosphatase: 56 U/L (ref 38–126)
Anion gap: 6 (ref 5–15)
BUN: 21 mg/dL (ref 8–23)
CO2: 22 mmol/L (ref 22–32)
Calcium: 7.6 mg/dL — ABNORMAL LOW (ref 8.9–10.3)
Chloride: 108 mmol/L (ref 98–111)
Creatinine, Ser: 0.91 mg/dL (ref 0.44–1.00)
GFR, Estimated: >60 mL/min (ref >60)
Glucose, Bld: 106 mg/dL — ABNORMAL HIGH (ref 70–99)
Potassium: 3.7 mmol/L (ref 3.5–5.1)
Sodium: 136 mmol/L (ref 135–145)
Total Bilirubin: 0.6 mg/dL (ref 0.3–1.2)
Total Protein: 5.7 g/dL — ABNORMAL LOW (ref 6.5–8.1)

## 2021-09-06 LAB — MAGNESIUM: Magnesium: 2.4 mg/dL (ref 1.7–2.4)

## 2021-09-06 LAB — PROTIME-INR
INR: 0.9 (ref 0.8–1.2)
Prothrombin Time: 12.4 seconds (ref 11.4–15.2)

## 2021-09-06 LAB — TSH: TSH: 0.81 u[IU]/mL (ref 0.350–4.500)

## 2021-09-06 MED ORDER — SODIUM CHLORIDE 0.9 % IV BOLUS
1000.0000 mL | Freq: Once | INTRAVENOUS | Status: AC
  Administered 2021-09-06: 1000 mL via INTRAVENOUS

## 2021-09-06 MED ORDER — IOHEXOL 350 MG/ML SOLN
100.0000 mL | Freq: Once | INTRAVENOUS | Status: AC | PRN
  Administered 2021-09-06: 75 mL via INTRAVENOUS

## 2021-09-06 MED ORDER — CALCIUM GLUCONATE-NACL 1-0.675 GM/50ML-% IV SOLN
1.0000 g | Freq: Once | INTRAVENOUS | Status: AC
  Administered 2021-09-06: 1000 mg via INTRAVENOUS
  Filled 2021-09-06: qty 50

## 2021-09-06 MED ORDER — SODIUM CHLORIDE (PF) 0.9 % IJ SOLN
INTRAMUSCULAR | Status: AC
  Filled 2021-09-06: qty 50

## 2021-09-06 MED ORDER — ACETAMINOPHEN 325 MG PO TABS
650.0000 mg | ORAL_TABLET | Freq: Once | ORAL | Status: AC
Start: 2021-09-06 — End: 2021-09-06
  Administered 2021-09-06: 650 mg via ORAL
  Filled 2021-09-06: qty 2

## 2021-09-06 NOTE — ED Notes (Signed)
Patient back from MRI.

## 2021-09-06 NOTE — ED Notes (Signed)
Destiny, RN completed Swallow Screen at 17:46

## 2021-09-06 NOTE — ED Notes (Signed)
Pt c/o headache, Admitting MD requested the ER doc to order Tylenol as pt is still in care of [facility] and has not been transported to [facility]. Dr. Regenia Skeeter to order medication, refer to [facility]

## 2021-09-06 NOTE — ED Notes (Signed)
Pt appears to be resting, playing on her Ipad, even RR and unlabored, NAD noted, call bell in reach, side rails up x2 for safety, care on going, will continue to monitor.

## 2021-09-06 NOTE — ED Notes (Signed)
Pt given additional water to drink, fruit cup, and a ham sandwich, pt very appreciated.

## 2021-09-06 NOTE — H&P (Signed)
History and Physical    Patient: Kaitlyn Wood KKX:[id number] DOB: 03/16/1951 DOA: 09/06/2021 DOS: the patient was seen and examined on 09/06/2021 PCP: Ngetich, [name], NP  Patient coming from: Home  Chief Complaint: "My eyes started wiggling and I lost my balance."  HPI: Kaitlyn Wood is a 71 y.o. female with medical history significant of bipolar d/o, DM2, HTN. Presenting with blurred vision and dizziness. She reports that 2 days ago she was in her kitchen and began to feel ill. She was nauseous and felt like vomiting. Her vision was blurry and she felt off balance. She got to her bedroom and started seeing "halos". She fell, hit her head, and passed out. She believes she was down for about 30 minutes. When she woke, she crawled into bed and went to sleep. Yesterday she woke without symptoms. She felt fine all day, so she dismissed the previous day's events. However, this morning, she was in her kitchen and felt her "eyes wiggling". Her vision went blurry and she felt dizzy. Her friend was near her when this happen and insisted that she go to the ED for evaluation. She denies any other aggravating or alleviating factors.    Review of Systems: As mentioned in the history of present illness. All other systems reviewed and are negative. Past Medical History:  Diagnosis Date   Anemia 02/05/2021   Anxiety    Bipolar 1 disorder ([place])    Bulging of cervical intervertebral disc    C2   Bulging of thoracic intervertebral disc    t4   Depression    Diabetes mellitus without complication ([place])    GI bleed 02/05/2021   Hyperlipidemia    PTSD (post-traumatic stress disorder)    Sad    Past Surgical History:  Procedure Laterality Date   CATARACT EXTRACTION Right 08/11/2017   CATARACT EXTRACTION Left 09/01/2017   [place]   JOINT REPLACEMENT  2013   hip   Social History:  reports that she has never smoked. She has never used smokeless tobacco. She reports that she does  not drink alcohol and does not use drugs.  Allergies  Allergen Reactions   Aspirin Other (See Comments)   Other Other (See Comments)    "makes my urine turn brown" Other reaction(s): MUSCLE PAIN "makes my urine turn brown"   Oxycodone     Migraines   Statins Other (See Comments)    "makes my urine turn brown" Other reaction(s): MUSCLE PAIN "makes my urine turn brown"    Family History  Problem Relation Age of Onset   Ovarian cancer Mother    Lymphoma Father    Colon cancer Sister     Prior to Admission medications   Medication Sig Start Date End Date Taking? Authorizing Provider  ADDERALL XR 5 MG 24 hr capsule Take 5 mg by mouth daily. 08/19/21  Yes [provider]  ABILIFY MAINTENA 300 MG SRER injection SMARTSIG:1 Vial(s) IM Once a Month 08/12/21   [provider]  Accu-Chek FastClix Lancets MISC Use to test blood sugar three times daily. Dx: E11.9 05/04/20   Ngetich, Dinah C, NP  amphetamine-dextroamphetamine (ADDERALL XR) 25 MG 24 hr capsule Take 25 mg by mouth daily. Brand name ONLY    [provider]  ARIPiprazole ER (ABILIFY MAINTENA) 300 MG PRSY prefilled syringe Inject 300 mg into the muscle every 30 (thirty) days. Last injection 10/28/2019 Brand Name ONLY    [provider]  Ascorbic  Acid (VITAMIN C) 1000 MG tablet Take 1,000 mg by mouth daily.    [provider]  ASTAXANTHIN PO Take 12 mg by mouth daily.     [provider]  Barberry-Oreg Grape-Goldenseal (BERBERINE COMPLEX PO) Take by mouth. 500 mg 3-4 times daily    [provider]  clonazePAM (KLONOPIN) 0.5 MG tablet Take 0.75 mg by mouth at bedtime. 08/19/21   [provider]  clonazePAM (KLONOPIN) 1 MG tablet 0.5 mg.    [provider]  Continuous Blood Gluc Sensor (FREESTYLE LIBRE 2 SENSOR) MISC USE TO TEST BLOOD SUGAR THREE TIMES DAILY 05/20/21   Ngetich, Dinah C, NP  gabapentin (NEURONTIN) 300 MG capsule TAKE 1 CAPSULE BY MOUTH THREE TIMES  DAILY 05/15/21   Ngetich, Dinah C, NP  lamoTRIgine (LAMICTAL) 200 MG tablet Take 200 mg by mouth 2 (two) times daily. Takes Brand Name ONLY    [provider]  lisinopril (ZESTRIL) 10 MG tablet TAKE 1 TABLET(10 MG) BY MOUTH DAILY 08/09/21   Ngetich, Dinah C, NP  loratadine (CLARITIN) 10 MG tablet TAKE 1 TABLET(10 MG) BY MOUTH DAILY 01/03/21   Ngetich, Dinah C, NP  metFORMIN (GLUCOPHAGE) 1000 MG tablet Take 1,000 mg by mouth every morning. 05/17/21   [provider]  metFORMIN (GLUCOPHAGE) 500 MG tablet Take 1 tablet (500 mg total) by mouth daily with breakfast. 11/23/20 02/21/21  Ngetich, Dinah C, NP  nystatin cream (MYCOSTATIN) Apply 1 application topically 2 (two) times daily. 07/23/20   Ngetich, Dinah C, NP  ondansetron (ZOFRAN) 4 MG tablet Take 4 mg by mouth 2 (two) times daily as needed. 06/21/21   [provider]  sertraline (ZOLOFT) 25 MG tablet Take 25 mg by mouth daily.    [provider]  sertraline (ZOLOFT) 50 MG tablet  08/09/21   [provider]  traZODone (DESYREL) 50 MG tablet Take 50 mg by mouth at bedtime. 08/16/21   [provider]    Physical Exam: Vitals:   09/06/21 1402 09/06/21 1455 09/06/21 1500 09/06/21 1530  BP: (!) 136/97 (!) 152/74 (!) 145/78 140/82  Pulse: 72 77 79 88  Resp: '12 15 14 16  '$ Temp:      TempSrc:      SpO2: 99% 97% 99% 99%  Weight:      Height:       General: 71 y.o. female resting in bed in NAD Eyes: PERRL, normal sclera ENMT: Nares patent w/o discharge, orophaynx clear, dentition normal, ears w/o discharge/lesions/ulcers Neck: Supple, trachea midline Cardiovascular: RRR, +S1, S2, no m/g/r, equal pulses throughout Respiratory: CTABL, no w/r/r, normal WOB GI: BS+, NDNT, no masses noted, no organomegaly noted MSK: No e/c/c Neuro: A&O x 3, no focal deficits Psyc: Appropriate interaction and affect, calm/cooperative  Data Reviewed:  Na+  136 K+  3.7 Glucose  106 Ca2+  7.6 WBC 4.1 Hgb   12.3  CTA H&N No acute intracranial abnormality. No large vessel occlusion, hemodynamically significant stenosis, or evidence of dissection.  MRI Brain 1. No acute intracranial abnormality. 2. Minimal chronic small vessel ischemia.  Assessment and Plan: TIA     - place in obs, tele     - complete TIA w/u: echo, A1c, lipid panel     - Neuro to see, appreciate assistance     - PT/OT/SLP     - patient reports that she is unable to take anti-platelet d/t history of significant GIBs   DM2     - A1c, SSI, DM diet, glucose  checks  Bipolar d/o PTSD     - continue home regimen  GERD     - PPI  HTN     - continue home regimen  Hypocalcemia      - replace Ca2+; check Mg2+  Advance Care Planning:   Code Status: FULL  Consults: Neurology  Family Communication: None at bedside  Severity of Illness: The appropriate patient status for this patient is OBSERVATION. Observation status is judged to be reasonable and necessary in order to provide the required intensity of service to ensure the patient's safety. The patient's presenting symptoms, physical exam findings, and initial radiographic and laboratory data in the context of their medical condition is felt to place them at decreased risk for further clinical deterioration. Furthermore, it is anticipated that the patient will be medically stable for discharge from the hospital within 2 midnights of admission.   Author: Jonnie Finner, DO 09/06/2021 4:07 PM  For on call review [email or web address].

## 2021-09-06 NOTE — ED Triage Notes (Signed)
Pt BIB EMS from home c/o dizziness and loss of balance. Patient states similar episode occurred on Thursday. Patient also states some vision issues when episodes occur.   [id number]

## 2021-09-06 NOTE — ED Notes (Signed)
[facility] called to transport Patient to [facility].

## 2021-09-06 NOTE — ED Notes (Signed)
Patient transported to MRI 

## 2021-09-06 NOTE — ED Provider Notes (Signed)
[facility] DEPT Provider Note   CSN: [id number] Arrival date & time: 09/06/21  1103     History  No chief complaint on file.   Kaitlyn Wood is a 71 y.o. female.  The history is provided by the patient and medical records. No language interpreter was used.  Neurologic Problem This is a new problem. The current episode started yesterday. The problem has been gradually improving. Associated symptoms include headaches. Pertinent negatives include no chest pain, no abdominal pain and no shortness of breath. The symptoms are aggravated by walking and standing. Nothing relieves the symptoms. She has tried nothing for the symptoms. The treatment provided no relief.       Home Medications Prior to Admission medications   Medication Sig Start Date End Date Taking? Authorizing Provider  Accu-Chek FastClix Lancets MISC Use to test blood sugar three times daily. Dx: E11.9 05/04/20   Ngetich, Dinah C, NP  amphetamine-dextroamphetamine (ADDERALL XR) 25 MG 24 hr capsule Take 25 mg by mouth daily. Brand name ONLY    [provider]  ARIPiprazole ER (ABILIFY MAINTENA) 300 MG PRSY prefilled syringe Inject 300 mg into the muscle every 30 (thirty) days. Last injection 10/28/2019 Brand Name ONLY    [provider]  Ascorbic Acid (VITAMIN C) 1000 MG tablet Take 1,000 mg by mouth daily.    [provider]  ASTAXANTHIN PO Take 12 mg by mouth daily.     [provider]  Barberry-Oreg Grape-Goldenseal (BERBERINE COMPLEX PO) Take by mouth. 500 mg 3-4 times daily    [provider]  clonazePAM (KLONOPIN) 1 MG tablet 0.5 mg.    [provider]  Continuous Blood Gluc Sensor (FREESTYLE LIBRE 2 SENSOR) MISC USE TO TEST BLOOD SUGAR THREE TIMES DAILY 05/20/21   Ngetich, Dinah C, NP  gabapentin (NEURONTIN) 300 MG capsule TAKE 1 CAPSULE BY MOUTH THREE TIMES DAILY 05/15/21   Ngetich, Dinah C, NP  lamoTRIgine (LAMICTAL) 200 MG tablet Take 200  mg by mouth 2 (two) times daily. Takes Brand Name ONLY    [provider]  lisinopril (ZESTRIL) 10 MG tablet TAKE 1 TABLET(10 MG) BY MOUTH DAILY 08/09/21   Ngetich, Dinah C, NP  loratadine (CLARITIN) 10 MG tablet TAKE 1 TABLET(10 MG) BY MOUTH DAILY 01/03/21   Ngetich, Dinah C, NP  metFORMIN (GLUCOPHAGE) 500 MG tablet Take 1 tablet (500 mg total) by mouth daily with breakfast. 11/23/20 02/21/21  Ngetich, Dinah C, NP  nystatin cream (MYCOSTATIN) Apply 1 application topically 2 (two) times daily. 07/23/20   Ngetich, Dinah C, NP  sertraline (ZOLOFT) 25 MG tablet Take 25 mg by mouth daily.    [provider]      Allergies    Aspirin, Other, Oxycodone, and Statins    Review of Systems   Review of Systems  Constitutional:  Positive for fatigue. Negative for chills, diaphoresis and fever.  HENT:  Negative for congestion.   Eyes:  Positive for visual disturbance. Negative for pain.  Respiratory:  Negative for cough, chest tightness and shortness of breath.   Cardiovascular:  Negative for chest pain and palpitations.  Gastrointestinal:  Negative for abdominal distention, abdominal pain, constipation, diarrhea, nausea and vomiting.  Genitourinary:  Negative for dysuria and flank pain.  Musculoskeletal:  Negative for back pain, neck pain and neck stiffness.  Skin:  Negative for rash and wound.  Neurological:  Positive for dizziness, light-headedness and headaches. Negative for facial asymmetry, speech difficulty, weakness and numbness.  Psychiatric/Behavioral:  Negative  for agitation.   All other systems reviewed and are negative.   Physical Exam Updated Vital Signs BP (!) 166/87 (BP Location: Right Arm)   Pulse 80   Temp 98.3 F (36.8 C) (Oral)   Resp 10   Ht '5\' 3"'$  (1.6 m)   Wt 83.5 kg   SpO2 100%   BMI 32.59 kg/m  Physical Exam Vitals and nursing note reviewed.  Constitutional:      General: She is not in acute distress.    Appearance: She is well-developed. She is not  ill-appearing, toxic-appearing or diaphoretic.  HENT:     Head: Normocephalic and atraumatic.     Nose: Nose normal. No congestion or rhinorrhea.     Mouth/Throat:     Mouth: Mucous membranes are dry.     Pharynx: No oropharyngeal exudate or posterior oropharyngeal erythema.  Eyes:     Conjunctiva/sclera: Conjunctivae normal.  Neck:     Vascular: Carotid bruit present.  Cardiovascular:     Rate and Rhythm: Normal rate and regular rhythm.     Pulses: Normal pulses.     Heart sounds: No murmur heard. Pulmonary:     Effort: Pulmonary effort is normal. No respiratory distress.     Breath sounds: Normal breath sounds. No wheezing, rhonchi or rales.  Chest:     Chest wall: No tenderness.  Abdominal:     Palpations: Abdomen is soft.     Tenderness: There is no abdominal tenderness. There is no right CVA tenderness, left CVA tenderness, guarding or rebound.  Musculoskeletal:        General: No swelling or tenderness.     Cervical back: Neck supple. No tenderness.  Skin:    General: Skin is warm and dry.     Capillary Refill: Capillary refill takes less than 2 seconds.     Findings: No erythema or rash.  Neurological:     General: No focal deficit present.     Mental Status: She is alert.     Cranial Nerves: No cranial nerve deficit.     Sensory: No sensory deficit.     Motor: No weakness.  Psychiatric:        Mood and Affect: Mood normal.     ED Results / Procedures / Treatments   Labs (all labs ordered are listed, but only abnormal results are displayed) Labs Reviewed  COMPREHENSIVE METABOLIC PANEL - Abnormal; Notable for the following components:      Result Value   Glucose, Bld 106 (*)    Calcium 7.6 (*)    Total Protein 5.7 (*)    AST 14 (*)    All other components within normal limits  CBC WITH DIFFERENTIAL/PLATELET  TSH  PROTIME-INR    EKG EKG Interpretation  Date/Time:  Friday September 06 2021 11:23:55 EDT Ventricular Rate:  86 PR Interval:  160 QRS  Duration: 86 QT Interval:  355 QTC Calculation: 425 R Axis:   56 Text Interpretation: Sinus rhythm Consider right atrial enlargement Abnormal R-wave progression, early transition no prior ECG for comparison. No STEMI Confirmed by Antony Blackbird [phone]) on 09/06/2021 1:10:35 PM  Radiology MR BRAIN WO CONTRAST  Result Date: 09/06/2021 CLINICAL DATA:  Neuro deficit, acute, stroke suspected. Waxing and waning diplopia, intermittent blurry vision, dizziness and unsteadiness, pain in upper neck and head, concern for carotid bruit on the left neck. Rule out TIA versus stroke. EXAM: MRI HEAD WITHOUT CONTRAST TECHNIQUE: Multiplanar, multiecho pulse sequences of the brain and surrounding structures were obtained without  intravenous contrast. COMPARISON:  Head and neck CTA 09/06/2021 FINDINGS: Brain: There is no evidence of an acute infarct, intracranial hemorrhage, mass, midline shift, or extra-axial fluid collection. The ventricles and sulci are normal. Scattered punctate T2 hyperintensities in the cerebral white matter are nonspecific but compatible with minimal chronic small vessel ischemic disease, not advanced for age. A chronic lacunar infarct is noted in the left thalamus. A small T2 hyperintense focus in the left midbrain likely reflects a dilated perivascular space. Vascular: Major intracranial vascular flow voids are preserved. Skull and upper cervical spine: Unremarkable bone marrow signal. Sinuses/Orbits: Bilateral cataract extraction. Paranasal sinuses and mastoid air cells are clear. Other: None. IMPRESSION: 1. No acute intracranial abnormality. 2. Minimal chronic small vessel ischemia. Electronically Signed   By: Logan Bores M.D.   On: 09/06/2021 14:49   CT Angio Head W or Wo Contrast  Result Date: 09/06/2021 CLINICAL DATA:  Neuro deficit, acute, stroke suspected; waxing and waning diplopia, intermittent blurry vision, dizziness and unsteadiness, pain in upper neck and head, concern for carotid bruit  on the left neck EXAM: CT ANGIOGRAPHY HEAD AND NECK TECHNIQUE: Multidetector CT imaging of the head and neck was performed using the standard protocol during bolus administration of intravenous contrast. Multiplanar CT image reconstructions and MIPs were obtained to evaluate the vascular anatomy. Carotid stenosis measurements (when applicable) are obtained utilizing NASCET criteria, using the distal internal carotid diameter as the denominator. RADIATION DOSE REDUCTION: This exam was performed according to the departmental dose-optimization program which includes automated exposure control, adjustment of the mA and/or kV according to patient size and/or use of iterative reconstruction technique. CONTRAST:  21m OMNIPAQUE IOHEXOL 350 MG/ML SOLN COMPARISON:  None Available. FINDINGS: CT HEAD Brain: There is no acute intracranial hemorrhage, mass effect, or edema. Gray-white differentiation is preserved. There is no extra-axial fluid collection. Ventricles and sulci are within normal limits in size and configuration. Vascular: No hyperdense vessel. Skull: Calvarium is unremarkable. Sinuses/Orbits: No acute finding. Other: None. Review of the MIP images confirms the above findings CTA NECK Aortic arch: Mild calcified plaque. Great vessel origins are patent. Right carotid system: Patent. Trace calcified plaque at the bifurcation. No stenosis. Left carotid system: Patent.  No stenosis. Vertebral arteries: Patent and codominant.  No stenosis. Skeleton: Degenerative changes of the included spine. Other neck: Unremarkable. Upper chest: Included lungs are clear. Review of the MIP images confirms the above findings CTA HEAD Anterior circulation: Intracranial internal carotid arteries are patent. Anterior and middle cerebral arteries are patent. Posterior circulation: Intracranial vertebral arteries, basilar artery, and posterior cerebral arteries are patent. Left posterior communicating artery is present. Venous sinuses:  Patent as allowed by contrast bolus timing. Review of the MIP images confirms the above findings IMPRESSION: No acute intracranial abnormality. No large vessel occlusion, hemodynamically significant stenosis, or evidence of dissection. Electronically Signed   By: PMacy MisM.D.   On: 09/06/2021 14:30   CT Angio Neck W and/or Wo Contrast  Result Date: 09/06/2021 CLINICAL DATA:  Neuro deficit, acute, stroke suspected; waxing and waning diplopia, intermittent blurry vision, dizziness and unsteadiness, pain in upper neck and head, concern for carotid bruit on the left neck EXAM: CT ANGIOGRAPHY HEAD AND NECK TECHNIQUE: Multidetector CT imaging of the head and neck was performed using the standard protocol during bolus administration of intravenous contrast. Multiplanar CT image reconstructions and MIPs were obtained to evaluate the vascular anatomy. Carotid stenosis measurements (when applicable) are obtained utilizing NASCET criteria, using the distal internal carotid diameter as  the denominator. RADIATION DOSE REDUCTION: This exam was performed according to the departmental dose-optimization program which includes automated exposure control, adjustment of the mA and/or kV according to patient size and/or use of iterative reconstruction technique. CONTRAST:  29m OMNIPAQUE IOHEXOL 350 MG/ML SOLN COMPARISON:  None Available. FINDINGS: CT HEAD Brain: There is no acute intracranial hemorrhage, mass effect, or edema. Gray-white differentiation is preserved. There is no extra-axial fluid collection. Ventricles and sulci are within normal limits in size and configuration. Vascular: No hyperdense vessel. Skull: Calvarium is unremarkable. Sinuses/Orbits: No acute finding. Other: None. Review of the MIP images confirms the above findings CTA NECK Aortic arch: Mild calcified plaque. Great vessel origins are patent. Right carotid system: Patent. Trace calcified plaque at the bifurcation. No stenosis. Left carotid system:  Patent.  No stenosis. Vertebral arteries: Patent and codominant.  No stenosis. Skeleton: Degenerative changes of the included spine. Other neck: Unremarkable. Upper chest: Included lungs are clear. Review of the MIP images confirms the above findings CTA HEAD Anterior circulation: Intracranial internal carotid arteries are patent. Anterior and middle cerebral arteries are patent. Posterior circulation: Intracranial vertebral arteries, basilar artery, and posterior cerebral arteries are patent. Left posterior communicating artery is present. Venous sinuses: Patent as allowed by contrast bolus timing. Review of the MIP images confirms the above findings IMPRESSION: No acute intracranial abnormality. No large vessel occlusion, hemodynamically significant stenosis, or evidence of dissection. Electronically Signed   By: PMacy MisM.D.   On: 09/06/2021 14:30    Procedures Procedures    Medications Ordered in ED Medications  sodium chloride 0.9 % bolus 1,000 mL (0 mLs Intravenous Stopped 09/06/21 1448)  iohexol (OMNIPAQUE) 350 MG/ML injection 100 mL (75 mLs Intravenous Contrast Given 09/06/21 1339)  sodium chloride (PF) 0.9 % injection (  Given by Other 09/06/21 1338)    ED Course/ Medical Decision Making/ A&P                           Medical Decision Making Amount and/or Complexity of Data Reviewed Labs: ordered. Radiology: ordered.  Risk Prescription drug management. Decision regarding hospitalization.    CShaunna Rosettiis a 71y.o. female with a past medical history significant for hypertension, hyperlipidemia, diabetes, PTSD, anxiety, bipolar disorder, and previous GI bleeding who presents with lightheadedness, dizziness, unsteadiness, blurry vision, diplopia, and some headache with a fall.  According to patient, for the last 2 days, patient has had several episodes of lightheadedness and unsteadiness.  She reports that she was unable to walk because she felt so wobbly and unsteady and  intermittently fell like she was going to pass out.  She says that she also had blurry vision and double vision with her eyes jumping which is never had before.  She says she was having some headache after she had a fall and hit her head on the ground but did not lose consciousness.  She denied fevers, chills, vomiting, constipation, diarrhea.  She denies any numbness, tingling, weakness of extremities and denies any speech difficulties.  She says she has not had history of vertigo and never had something like this before.  She reports that she thinks she could be slightly dehydrated as she has not had much to drink this morning.  Patient denies any new medications or supplements.  Denies any preceding symptoms.  She has never had anything like this before.  On exam, lungs clear and chest nontender.  Abdomen nontender.  No murmur.  Patient did  have evidence of carotid bruit in the left neck greater than right.  No tenderness on her head exam.  Pupils are symmetric and reactive normal extract movements.  Intact finger-nose-finger testing.  Intact visual field.  Pupils are symmetric and reactive normal extract movements.  She reports she is not having blurry vision or diplopia during my exam.  She had normal sensation, strength, and pulses in all extremities.  Normal finger-nose-finger testing bilaterally.  Exam otherwise unremarkable.  Patient was feeling lightheaded and fatigue so we did not ambulate initially.  Given the patient's report of posterior circulation type symptoms with the vision changes, dizziness, unsteadiness, and some pain, we did agree to get CTA of her head and neck to rule out dissection or significant stenosis in her neck.  She did have the carotid bruit I suspected I heard on exam.  However given the intermittent symptoms for the last few days with the vision changes and unsteadiness I do feel that she still needs MRI as well.  We will give her some fluids to help with hydration.  As her  headache was minimal during my evaluation we will hold on medications initially.  Anticipate reassessment after work-up to determine disposition.  3:51 PM CTA of the head and neck did not show acute stenosis or dissection however the MRI did show evidence of several old strokes.  Patient does not have a history of this.  They do not see acute stroke however clinically I am is concerned about TIA.  I called and spoke both to Dr. Quinn Axe and Dr. Leonel Ramsay who agreed that patient needs admission for TIA work-up.  They recommended antiplatelet therapy however when I discussed the patient, she reports he was told never to take aspirin or Plavix due to history of significant GI bleeding in the past.  Will defer antiplatelet discussion further with admitting team.  We will call for hospitalist admission to [facility] to be seen by neurology for suspected recurrent TIAs.         Final Clinical Impression(s) / ED Diagnoses Final diagnoses:  Dizziness  Transient vision disturbance  TIA (transient ischemic attack)     Clinical Impression: 1. Dizziness   2. Transient vision disturbance   3. TIA (transient ischemic attack)     Disposition: Admit  This note was prepared with assistance of Dragon voice recognition software. Occasional wrong-word or sound-a-like substitutions may have occurred due to the inherent limitations of voice recognition software.     Alazay Leicht, Gwenyth Allegra, MD 09/06/21 [phone]

## 2021-09-06 NOTE — ED Notes (Signed)
Carb modified diet order

## 2021-09-06 NOTE — ED Notes (Signed)
Pt updated on transport to [facility], pt reports understanding of delay, no other concerns at this time

## 2021-09-07 ENCOUNTER — Observation Stay (HOSPITAL_COMMUNITY): Payer: Medicare HMO

## 2021-09-07 ENCOUNTER — Observation Stay (HOSPITAL_BASED_OUTPATIENT_CLINIC_OR_DEPARTMENT_OTHER): Payer: Medicare HMO

## 2021-09-07 DIAGNOSIS — I10 Essential (primary) hypertension: Secondary | ICD-10-CM

## 2021-09-07 DIAGNOSIS — E1159 Type 2 diabetes mellitus with other circulatory complications: Secondary | ICD-10-CM | POA: Diagnosis not present

## 2021-09-07 DIAGNOSIS — F310 Bipolar disorder, current episode hypomanic: Secondary | ICD-10-CM | POA: Diagnosis not present

## 2021-09-07 DIAGNOSIS — G459 Transient cerebral ischemic attack, unspecified: Secondary | ICD-10-CM | POA: Diagnosis not present

## 2021-09-07 DIAGNOSIS — R29818 Other symptoms and signs involving the nervous system: Secondary | ICD-10-CM

## 2021-09-07 LAB — RAPID URINE DRUG SCREEN, HOSP PERFORMED
Amphetamines: POSITIVE — AB
Barbiturates: NOT DETECTED
Benzodiazepines: NOT DETECTED
Cocaine: NOT DETECTED
Opiates: NOT DETECTED
Tetrahydrocannabinol: NOT DETECTED

## 2021-09-07 LAB — LIPID PANEL
Cholesterol: 235 mg/dL — ABNORMAL HIGH (ref 0–200)
HDL: 49 mg/dL (ref >40)
LDL Cholesterol: 157 mg/dL — ABNORMAL HIGH (ref 0–99)
Total CHOL/HDL Ratio: 4.8 RATIO
Triglycerides: 145 mg/dL (ref <150)
VLDL: 29 mg/dL (ref 0–40)

## 2021-09-07 LAB — HEMOGLOBIN A1C
Hgb A1c MFr Bld: 6.3 % — ABNORMAL HIGH (ref 4.8–5.6)
Mean Plasma Glucose: 134.11 mg/dL

## 2021-09-07 LAB — ECHOCARDIOGRAM COMPLETE
AR max vel: 2.23 cm2
AV Area VTI: 2.2 cm2
AV Area mean vel: 2.12 cm2
AV Mean grad: 4 mmHg
AV Peak grad: 6.7 mmHg
Ao pk vel: 1.29 m/s
Area-P 1/2: 3.6 cm2
Height: 63 in
S' Lateral: 2.8 cm
Weight: 2944 oz

## 2021-09-07 LAB — GLUCOSE, CAPILLARY
Glucose-Capillary: 147 mg/dL — ABNORMAL HIGH (ref 70–99)
Glucose-Capillary: 99 mg/dL (ref 70–99)
Glucose-Capillary: 143 mg/dL — ABNORMAL HIGH (ref 70–99)

## 2021-09-07 MED ORDER — SERTRALINE HCL 25 MG PO TABS
25.0000 mg | ORAL_TABLET | Freq: Every day | ORAL | Status: DC
  Administered 2021-09-07: 25 mg via ORAL
  Filled 2021-09-07: qty 1

## 2021-09-07 MED ORDER — ENOXAPARIN SODIUM 40 MG/0.4ML IJ SOSY: 40.0000 mg | PREFILLED_SYRINGE | INTRAMUSCULAR | Status: DC

## 2021-09-07 MED ORDER — TRAZODONE HCL 50 MG PO TABS
50.0000 mg | ORAL_TABLET | Freq: Every day | ORAL | Status: DC
  Administered 2021-09-07: 50 mg via ORAL
  Filled 2021-09-07: qty 1

## 2021-09-07 MED ORDER — ACETAMINOPHEN 650 MG RE SUPP: 650.0000 mg | RECTAL | Status: DC | PRN

## 2021-09-07 MED ORDER — ACETAMINOPHEN 325 MG PO TABS
650.0000 mg | ORAL_TABLET | ORAL | Status: DC | PRN
  Administered 2021-09-07: 650 mg via ORAL
  Filled 2021-09-07: qty 2

## 2021-09-07 MED ORDER — LAMOTRIGINE 100 MG PO TABS: 200.0000 mg | ORAL_TABLET | Freq: Two times a day (BID) | ORAL | Status: DC

## 2021-09-07 MED ORDER — EZETIMIBE 10 MG PO TABS: 10.0000 mg | ORAL_TABLET | Freq: Every day | ORAL | 3 refills | Status: DC

## 2021-09-07 MED ORDER — EZETIMIBE 10 MG PO TABS
10.0000 mg | ORAL_TABLET | Freq: Every day | ORAL | Status: DC
Start: 2021-09-07 — End: 2021-09-07

## 2021-09-07 MED ORDER — ONDANSETRON 4 MG PO TBDP: 4.0000 mg | ORAL_TABLET | Freq: Three times a day (TID) | ORAL | Status: DC | PRN

## 2021-09-07 MED ORDER — LISINOPRIL 10 MG PO TABS
10.0000 mg | ORAL_TABLET | Freq: Every day | ORAL | Status: DC
  Administered 2021-09-07: 10 mg via ORAL
  Filled 2021-09-07: qty 1

## 2021-09-07 MED ORDER — ACETAMINOPHEN 160 MG/5ML PO SOLN: 650.0000 mg | ORAL | Status: DC | PRN

## 2021-09-07 MED ORDER — AMPHETAMINE-DEXTROAMPHET ER 5 MG PO CP24: 5.0000 mg | ORAL_CAPSULE | Freq: Every day | ORAL | Status: DC

## 2021-09-07 MED ORDER — PANTOPRAZOLE SODIUM 40 MG PO TBEC: 40.0000 mg | DELAYED_RELEASE_TABLET | Freq: Every day | ORAL | 3 refills | Status: DC

## 2021-09-07 MED ORDER — CLONAZEPAM 1 MG PO TABS
1.0000 mg | ORAL_TABLET | Freq: Every day | ORAL | Status: DC
  Administered 2021-09-07: 1 mg via ORAL
  Filled 2021-09-07: qty 1

## 2021-09-07 MED ORDER — STROKE: EARLY STAGES OF RECOVERY BOOK
Freq: Once | Status: AC
Start: 2021-09-07 — End: 2021-09-07
  Filled 2021-09-07: qty 1

## 2021-09-07 MED ORDER — INSULIN ASPART 100 UNIT/ML IJ SOLN: 0.0000 [IU] | Freq: Every day | INTRAMUSCULAR | Status: DC

## 2021-09-07 MED ORDER — CLOPIDOGREL BISULFATE 75 MG PO TABS: 75.0000 mg | ORAL_TABLET | Freq: Every day | ORAL | 3 refills | Status: DC

## 2021-09-07 MED ORDER — SENNOSIDES-DOCUSATE SODIUM 8.6-50 MG PO TABS: 1.0000 | ORAL_TABLET | Freq: Every evening | ORAL | Status: DC | PRN

## 2021-09-07 MED ORDER — GABAPENTIN 300 MG PO CAPS
300.0000 mg | ORAL_CAPSULE | Freq: Three times a day (TID) | ORAL | Status: DC
  Administered 2021-09-07 (×2): 300 mg via ORAL
  Filled 2021-09-07 (×2): qty 1

## 2021-09-07 MED ORDER — SODIUM CHLORIDE 0.9 % IV SOLN: INTRAVENOUS | Status: DC

## 2021-09-07 MED ORDER — CLOPIDOGREL BISULFATE 75 MG PO TABS
75.0000 mg | ORAL_TABLET | Freq: Every day | ORAL | Status: DC
  Administered 2021-09-07: 75 mg via ORAL
  Filled 2021-09-07: qty 1

## 2021-09-07 MED ORDER — CLONAZEPAM 1 MG PO TABS
1.0000 mg | ORAL_TABLET | Freq: Two times a day (BID) | ORAL | Status: DC | PRN
Start: 2021-09-07 — End: 2021-09-07
  Administered 2021-09-07: 1 mg via ORAL
  Filled 2021-09-07: qty 1

## 2021-09-07 MED ORDER — INSULIN ASPART 100 UNIT/ML IJ SOLN
0.0000 [IU] | Freq: Three times a day (TID) | INTRAMUSCULAR | Status: DC
  Administered 2021-09-07: 1 [IU] via SUBCUTANEOUS

## 2021-09-07 NOTE — Progress Notes (Signed)
Echo attempted. Patient stated, "No you can't do it right now. I need to eat." Will attempt again as schedule permits.

## 2021-09-07 NOTE — Evaluation (Signed)
Physical Therapy Evaluation Patient Details Name: Kaitlyn [name] MRN: [id number] DOB: [birth year] Today's Date: 09/07/2021  History of Present Illness  70 y.o. female Presenting 09/06/21 with blurred vision and dizziness. s/p fall with hitting her head. MRI Brain no acute findings; PMH significant of bipolar d/o, DM2, HTN.  Clinical Impression   Pt admitted secondary to problem above with deficits below. PTA patient was living alone in an apartment with no stairs to enter. She admits to having balance deficits with some falls. She has not gone to PT due to the cost.  Pt currently requires minguard assist with ambulation due to slight drift to her right (no overt loss of balance). Anticipate if she uses her cane, this will provide enough support to decrease her fall risk. She is now agreeable to go to OPPT for balance training.  Anticipate patient will benefit from PT to address problems listed below.Will continue to follow acutely to maximize functional mobility independence and safety.          Recommendations for follow up therapy are one component of a multi-disciplinary discharge planning process, led by the attending physician.  Recommendations may be updated based on patient status, additional functional criteria and insurance authorization.  Follow Up Recommendations Outpatient PT    Assistance Recommended at Discharge None  Patient can return home with the following       Equipment Recommendations None recommended by PT  Recommendations for Other Services  OT consult    Functional Status Assessment Patient has had a recent decline in their functional status and demonstrates the ability to make significant improvements in function in a reasonable and predictable amount of time.     Precautions / Restrictions Precautions Precautions: Fall      Mobility  Bed Mobility Overal bed mobility: Independent                  Transfers Overall transfer level:  Independent Equipment used: None               General transfer comment: slight sway upon standing with independent recovery    Ambulation/Gait Ambulation/Gait assistance: Min guard Gait Distance (Feet): 170 Feet Assistive device: None Gait Pattern/deviations: Step-through pattern, Drifts right/left, Wide base of support Gait velocity: WNL Gait velocity interpretation: >2.62 ft/sec, indicative of community ambulatory   General Gait Details: persistently drifitng to her right, can correct without assist, but then recurs  Stairs            Wheelchair Mobility    Modified Rankin (Stroke Patients Only)       Balance Overall balance assessment: Needs assistance Sitting-balance support: No upper extremity supported, Feet supported Sitting balance-Leahy Scale: Good     Standing balance support: No upper extremity supported Standing balance-Leahy Scale: Poor Standing balance comment: static standing with incr ant-post sway, but no overt LOB                             Pertinent Vitals/Pain Pain Assessment Pain Assessment: No/denies pain    Home Living Family/patient expects to be discharged to:: Private residence Living Arrangements: Alone Available Help at Discharge: Friend(s);Available PRN/intermittently Type of Home: Apartment Home Access: Elevator       Home Layout: One level Home Equipment: Cane - single point;Grab bars - tub/shower;Grab bars - toilet      Prior Function Prior Level of Function : Independent/Modified Independent;Driving  Mobility Comments: does water aerobics       Hand Dominance   Dominant Hand: Right    Extremity/Trunk Assessment   Upper Extremity Assessment Upper Extremity Assessment: Defer to OT evaluation    Lower Extremity Assessment Lower Extremity Assessment: Overall WFL for tasks assessed    Cervical / Trunk Assessment Cervical / Trunk Assessment: Normal  Communication    Communication: No difficulties  Cognition Arousal/Alertness: Awake/alert Behavior During Therapy: WFL for tasks assessed/performed Overall Cognitive Status: Within Functional Limits for tasks assessed                                          General Comments      Exercises     Assessment/Plan    PT Assessment Patient needs continued PT services  PT Problem List Decreased balance;Decreased mobility;Decreased knowledge of use of DME       PT Treatment Interventions DME instruction;Gait training;Functional mobility training;Therapeutic activities;Therapeutic exercise;Balance training;Patient/family education    PT Goals (Current goals can be found in the Care Plan section)  Acute Rehab PT Goals Patient Stated Goal: to return home today; agrees to use cane PT Goal Formulation: With patient Time For Goal Achievement: 09/21/21 Potential to Achieve Goals: Good    Frequency Min 3X/week     Co-evaluation               AM-PAC PT "6 Clicks" Mobility  Outcome Measure Help needed turning from your back to your side while in a flat bed without using bedrails?: None Help needed moving from lying on your back to sitting on the side of a flat bed without using bedrails?: None Help needed moving to and from a bed to a chair (including a wheelchair)?: A Little Help needed standing up from a chair using your arms (e.g., wheelchair or bedside chair)?: A Little Help needed to walk in hospital room?: A Little Help needed climbing 3-5 steps with a railing? : A Little 6 Click Score: 20    End of Session Equipment Utilized During Treatment: Gait belt Activity Tolerance: Patient tolerated treatment well Patient left: in chair;with call bell/phone within reach;with chair alarm set Nurse Communication: Mobility status PT Visit Diagnosis: Unsteadiness on feet (R26.81)    Time: [id number] PT Time Calculation (min) (ACUTE ONLY): 22 min   Charges:   PT Evaluation $PT  Eval Low Complexity: [place], PT Acute Rehabilitation Services  Office [phone]   Rexanne Mano 09/07/2021, 8:51 AM

## 2021-09-07 NOTE — ED Notes (Signed)
Pt appears to be resting, even RR and unlabored, NAD noted, call bell in reach, side rails up x2 for safety, care on going, will continue to monitor. Update given regarding transport, pt verbalized understanding

## 2021-09-07 NOTE — Procedures (Signed)
Patient Name: Kaitlyn [name]  MRN: [id number]  Epilepsy Attending: Lora Havens  Referring Physician/Provider: Rosalin Hawking, MD  Date: 09/07/2021 Duration: 21.13 mins   Patient history: 71 y.o. female with PMH of bipolar disorder on Lamictal, diabetes, hypertension admitted for episodes of blurry vision, dizziness, imbalance and questionable nystagmus and HA since Wednesday.  EEG to evaluate for siezure  Level of alertness: Awake, asleep  AEDs during EEG study: LTG, Clonazepam, GBP  Technical aspects: This EEG study was done with scalp electrodes positioned according to the 10-20 International system of electrode placement. Electrical activity was acquired at a sampling rate of '500Hz'$  and reviewed with a high frequency filter of '70Hz'$  and a low frequency filter of '1Hz'$ . EEG data were recorded continuously and digitally stored.   Description: The posterior dominant rhythm consists of 7 Hz activity of moderate voltage (25-35 uV) seen predominantly in posterior head regions, symmetric and reactive to eye opening and eye closing. Sleep was characterized by vertex waves, sleep spindles (12 to 14 Hz), maximal frontocentral region. Hyperventilation and photic stimulation were not performed.     ABNORMALITY - Background slow  IMPRESSION: This study is suggestive of mild diffuse encephalopathy, nonspecific etiology. No seizures or epileptiform discharges were seen throughout the recording.  Acadia Thammavong Barbra Sarks

## 2021-09-07 NOTE — Progress Notes (Addendum)
STROKE TEAM PROGRESS NOTE   INTERVAL HISTORY Patient is seen in her room with no family at the bedside.  On Wednesday 6/7, she had an episode of sudden onset dizziness and nausea which resulted in a fall with brief LOC.  On Friday 6/9, she had an episode of headache, subjective nystagmus ("eyes jumping around") and dizziness.  CT and MRI were negative for acute stroke, and symptoms were most likely caused by a complicated migraine. CT angiogram of the brain and neck did not show significant arterial stenosis or occlusion.  LDL cholesterol 157 mg percent and hemoglobin A1c 6.3. Vitals:   09/07/21 0000 09/07/21 0124 09/07/21 0741 09/07/21 1157  BP: 111/71 121/65 111/65 117/75  Pulse: 75 77 84 78  Resp: '17 14 13 16  '$ Temp: 97.8 F (36.6 C) 98 F (36.7 C) 97.9 F (36.6 C) 99.2 F (37.3 C)  TempSrc:  Oral Oral Oral  SpO2: 93% 93% 94%   Weight:      Height:       CBC:  Recent Labs  Lab 09/06/21 1207  WBC 4.1  NEUTROABS 3.1  HGB 12.3  HCT 37.0  MCV 89.2  PLT 924   Basic Metabolic Panel:  Recent Labs  Lab 09/06/21 1207 09/06/21 2003  NA 136  --   K 3.7  --   CL 108  --   CO2 22  --   GLUCOSE 106*  --   BUN 21  --   CREATININE 0.91  --   CALCIUM 7.6*  --   MG  --  2.4   Lipid Panel:  Recent Labs  Lab 09/07/21 0123  CHOL 235*  TRIG 145  HDL 49  CHOLHDL 4.8  VLDL 29  LDLCALC 157*   HgbA1c:  Recent Labs  Lab 09/07/21 0123  HGBA1C 6.3*   Urine Drug Screen:  Recent Labs  Lab 09/07/21 0524  LABOPIA NONE DETECTED  COCAINSCRNUR NONE DETECTED  LABBENZ NONE DETECTED  AMPHETMU POSITIVE*  THCU NONE DETECTED  LABBARB NONE DETECTED    Alcohol Level No results for input(s): "ETH" in the last 168 hours.  IMAGING past 24 hours EEG adult  Result Date: 09/07/2021 Lora Havens, MD     09/07/2021  7:36 AM Patient Name: Kaitlyn [name] MRN: [id number] Epilepsy Attending: Lora Havens Referring Physician/Provider: Rosalin Hawking, MD Date: 09/07/2021 Duration: 21.13 mins  Patient history: 71 y.o. female with PMH of bipolar disorder on Lamictal, diabetes, hypertension admitted for episodes of blurry vision, dizziness, imbalance and questionable nystagmus and HA since Wednesday.  EEG to evaluate for siezure Level of alertness: Awake, asleep AEDs during EEG study: LTG, Clonazepam, GBP Technical aspects: This EEG study was done with scalp electrodes positioned according to the 10-20 International system of electrode placement. Electrical activity was acquired at a sampling rate of '500Hz'$  and reviewed with a high frequency filter of '70Hz'$  and a low frequency filter of '1Hz'$ . EEG data were recorded continuously and digitally stored. Description: The posterior dominant rhythm consists of 7 Hz activity of moderate voltage (25-35 uV) seen predominantly in posterior head regions, symmetric and reactive to eye opening and eye closing. Sleep was characterized by vertex waves, sleep spindles (12 to 14 Hz), maximal frontocentral region. Hyperventilation and photic stimulation were not performed.   ABNORMALITY - Background slow IMPRESSION: This study is suggestive of mild diffuse encephalopathy, nonspecific etiology. No seizures or epileptiform discharges were seen throughout the recording. Lora Havens   MR BRAIN WO CONTRAST  Result Date: 09/06/2021  CLINICAL DATA:  Neuro deficit, acute, stroke suspected. Waxing and waning diplopia, intermittent blurry vision, dizziness and unsteadiness, pain in upper neck and head, concern for carotid bruit on the left neck. Rule out TIA versus stroke. EXAM: MRI HEAD WITHOUT CONTRAST TECHNIQUE: Multiplanar, multiecho pulse sequences of the brain and surrounding structures were obtained without intravenous contrast. COMPARISON:  Head and neck CTA 09/06/2021 FINDINGS: Brain: There is no evidence of an acute infarct, intracranial hemorrhage, mass, midline shift, or extra-axial fluid collection. The ventricles and sulci are normal. Scattered punctate T2  hyperintensities in the cerebral white matter are nonspecific but compatible with minimal chronic small vessel ischemic disease, not advanced for age. A chronic lacunar infarct is noted in the left thalamus. A small T2 hyperintense focus in the left midbrain likely reflects a dilated perivascular space. Vascular: Major intracranial vascular flow voids are preserved. Skull and upper cervical spine: Unremarkable bone marrow signal. Sinuses/Orbits: Bilateral cataract extraction. Paranasal sinuses and mastoid air cells are clear. Other: None. IMPRESSION: 1. No acute intracranial abnormality. 2. Minimal chronic small vessel ischemia. Electronically Signed   By: Logan Bores M.D.   On: 09/06/2021 14:49   CT Angio Head W or Wo Contrast  Result Date: 09/06/2021 CLINICAL DATA:  Neuro deficit, acute, stroke suspected; waxing and waning diplopia, intermittent blurry vision, dizziness and unsteadiness, pain in upper neck and head, concern for carotid bruit on the left neck EXAM: CT ANGIOGRAPHY HEAD AND NECK TECHNIQUE: Multidetector CT imaging of the head and neck was performed using the standard protocol during bolus administration of intravenous contrast. Multiplanar CT image reconstructions and MIPs were obtained to evaluate the vascular anatomy. Carotid stenosis measurements (when applicable) are obtained utilizing NASCET criteria, using the distal internal carotid diameter as the denominator. RADIATION DOSE REDUCTION: This exam was performed according to the departmental dose-optimization program which includes automated exposure control, adjustment of the mA and/or kV according to patient size and/or use of iterative reconstruction technique. CONTRAST:  12m OMNIPAQUE IOHEXOL 350 MG/ML SOLN COMPARISON:  None Available. FINDINGS: CT HEAD Brain: There is no acute intracranial hemorrhage, mass effect, or edema. Gray-white differentiation is preserved. There is no extra-axial fluid collection. Ventricles and sulci are  within normal limits in size and configuration. Vascular: No hyperdense vessel. Skull: Calvarium is unremarkable. Sinuses/Orbits: No acute finding. Other: None. Review of the MIP images confirms the above findings CTA NECK Aortic arch: Mild calcified plaque. Great vessel origins are patent. Right carotid system: Patent. Trace calcified plaque at the bifurcation. No stenosis. Left carotid system: Patent.  No stenosis. Vertebral arteries: Patent and codominant.  No stenosis. Skeleton: Degenerative changes of the included spine. Other neck: Unremarkable. Upper chest: Included lungs are clear. Review of the MIP images confirms the above findings CTA HEAD Anterior circulation: Intracranial internal carotid arteries are patent. Anterior and middle cerebral arteries are patent. Posterior circulation: Intracranial vertebral arteries, basilar artery, and posterior cerebral arteries are patent. Left posterior communicating artery is present. Venous sinuses: Patent as allowed by contrast bolus timing. Review of the MIP images confirms the above findings IMPRESSION: No acute intracranial abnormality. No large vessel occlusion, hemodynamically significant stenosis, or evidence of dissection. Electronically Signed   By: PMacy MisM.D.   On: 09/06/2021 14:30   CT Angio Neck W and/or Wo Contrast  Result Date: 09/06/2021 CLINICAL DATA:  Neuro deficit, acute, stroke suspected; waxing and waning diplopia, intermittent blurry vision, dizziness and unsteadiness, pain in upper neck and head, concern for carotid bruit on the left neck EXAM:  CT ANGIOGRAPHY HEAD AND NECK TECHNIQUE: Multidetector CT imaging of the head and neck was performed using the standard protocol during bolus administration of intravenous contrast. Multiplanar CT image reconstructions and MIPs were obtained to evaluate the vascular anatomy. Carotid stenosis measurements (when applicable) are obtained utilizing NASCET criteria, using the distal internal carotid  diameter as the denominator. RADIATION DOSE REDUCTION: This exam was performed according to the departmental dose-optimization program which includes automated exposure control, adjustment of the mA and/or kV according to patient size and/or use of iterative reconstruction technique. CONTRAST:  62m OMNIPAQUE IOHEXOL 350 MG/ML SOLN COMPARISON:  None Available. FINDINGS: CT HEAD Brain: There is no acute intracranial hemorrhage, mass effect, or edema. Gray-white differentiation is preserved. There is no extra-axial fluid collection. Ventricles and sulci are within normal limits in size and configuration. Vascular: No hyperdense vessel. Skull: Calvarium is unremarkable. Sinuses/Orbits: No acute finding. Other: None. Review of the MIP images confirms the above findings CTA NECK Aortic arch: Mild calcified plaque. Great vessel origins are patent. Right carotid system: Patent. Trace calcified plaque at the bifurcation. No stenosis. Left carotid system: Patent.  No stenosis. Vertebral arteries: Patent and codominant.  No stenosis. Skeleton: Degenerative changes of the included spine. Other neck: Unremarkable. Upper chest: Included lungs are clear. Review of the MIP images confirms the above findings CTA HEAD Anterior circulation: Intracranial internal carotid arteries are patent. Anterior and middle cerebral arteries are patent. Posterior circulation: Intracranial vertebral arteries, basilar artery, and posterior cerebral arteries are patent. Left posterior communicating artery is present. Venous sinuses: Patent as allowed by contrast bolus timing. Review of the MIP images confirms the above findings IMPRESSION: No acute intracranial abnormality. No large vessel occlusion, hemodynamically significant stenosis, or evidence of dissection. Electronically Signed   By: PMacy MisM.D.   On: 09/06/2021 14:30    PHYSICAL EXAM General:  Alert, well-developed, well-nourished patient in no acute distress Respiratory:  Regular, unlabored respirations on room air  NEURO:  Mental Status: AA&Ox3  Speech/Language: speech is without dysarthria or aphasia.  Fluency, and comprehension intact.  Cranial Nerves:  II: PERRL. Visual fields full.  III, IV, VI: EOMI. Eyelids elevate symmetrically.  V: Sensation is intact to light touch and symmetrical to face.  VII: Smile is symmetrical.   VIII: hearing intact to voice. IX, X: Phonation is normal.  XII: tongue is midline without fasciculations. Motor: 5/5 strength to all muscle groups tested.  Sensation- Intact to light touch bilaterally.  Coordination: FTN intact bilaterally.No drift.  Gait- deferred    ASSESSMENT/PLAN Ms. CLilybelle Mayedais a 71y.o. female with history of DM, bipolar disorder and HTN presenting after an episode of sudden onset dizziness and nausea which resulted in a fall with brief LOC which occurred on 6/7.  On Friday 6/9, she had an episode of headache, subjective nystagmus ("eyes jumping around") and dizziness.  CT and MRI were negative for acute stroke, and symptoms were most likely caused by a complicated migraine.  Complicated migraine with strokelike symptoms: CT head No acute abnormality.  CTA head & neck No LVO or hemodynamically significant stenosis MRI  no acute abnormality, minimal chronic small vessel ischemia 2D Echo pending LDL 157 HgbA1c 6.3 VTE prophylaxis - lovenox    Diet   Diet Carb Modified Fluid consistency: Thin; Room service appropriate? Yes   No antithrombotic prior to admission, now on clopidogrel 75 mg daily secondary to aspirin allergy Therapy recommendations:  outpatient PT/OT Disposition:  pending, likely home  Hypertension Home meds:  lisinopril  10 mg daily Stable Keep BP <180/105 Long-term BP goal normotensive  Hyperlipidemia Home meds:  none LDL 157, goal < 70 Add zetia 10 mg daily  High intensity statin not indicated due to previous intolerance Continue statin at discharge  Diabetes type II  Controlled Home meds:  Metformin 1000 mg daily HgbA1c 6.3, goal < 7.0 CBGs Recent Labs    09/07/21 0208 09/07/21 0742 09/07/21 1158  GLUCAP 147* 99 143*    SSI  Other Stroke Risk Factors Advanced Age >/= 62  Obesity, Body mass index is 32.59 kg/m., BMI >/= 30 associated with increased stroke risk, recommend weight loss, diet and exercise as appropriate  Migraines  Other Active Problems Bipolar disorder Continue home [facility] day # [place] , MSN, AGACNP-BC [facility] See Amion for schedule and pager information 09/07/2021 1:54 PM    STROKE MD NOTE :  I have personally obtained history,examined this patient, reviewed notes, independently viewed imaging studies, participated in medical decision making and plan of care.ROS completed by me personally and pertinent positives fully documented  I have made any additions or clarifications directly to the above note. Agree with note above.  Patient presented with transient vision disturbance as well as eye movement abnormalities in the setting of a headache which appears to resolve and likely represents a complicated migraine episode.  Neurovascular imaging is negative.  The patient's episodes are not frequent enough to justify prophylactic this.  Recommend Plavix for stroke prevention and aggressive risk factor modification.  Patient may be discharged after echocardiogram is resulted.  Discussed with patient and Dr. Sloan Leiter.  May follow-up electively as an outpatient with neurology.  Greater than 50% time during this 50-minute visit was spent in counseling and coordination of care about episode of suspected complicated migraine and discussion about evaluation and treatment and prevention Questions.  Antony Contras, MD Medical Director [facility] Pager: [phone] 09/07/2021 2:44 PM   To contact Stroke Continuity provider, please refer to [email or web address] After hours, contact General  Neurology

## 2021-09-07 NOTE — Progress Notes (Signed)
PROGRESS NOTE        PATIENT DETAILS Name: Kaitlyn [name] Age: 71 y.o. Sex: female Date of Birth: Sep 19, 1950 Admit Date: 09/06/2021 Admitting Physician Jonnie Finner, DO POE:UMPNTIR, [name], NP  Brief Summary: Patient is a 71 y.o.  female DM-2, HTN, bipolar disorder, migraine headaches-presented with blurry vision/vertigo/unsteady gait/headache-subsequently admitted to [facility] for further evaluation and treatment.  Significant events: 6/09>> admit to [facility]-vertigo/blurry vision/unsteady gait/headache  Significant studies: 6/09>> CTA head/neck: No LVO/significant stenosis/dissection 6/09>> MRI brain: No acute CVA 6/09>> UDS: Positive for amphetamines (on Adderall) 6/09>> TSH: 0.810 6/10>> A1c: 6.3 6/10>> LDL: 157 6/10>> EEG: No seizures  Significant microbiology data:   Procedures:   Consults: Neurology  Subjective: Feels much better today-no headache/vertigo/blurry vision-all resolved.  Objective: Vitals: Blood pressure 111/65, pulse 84, temperature 97.9 F (36.6 C), temperature source Oral, resp. rate 13, height '5\' 3"'$  (1.6 m), weight 83.5 kg, SpO2 94 %.   Exam: Gen Exam:Alert awake-not in any distress HEENT:atraumatic, normocephalic Chest: B/L clear to auscultation anteriorly CVS:S1S2 regular Abdomen:soft non tender, non distended Extremities:no edema Neurology: Non focal Skin: no rash  Pertinent Labs/Radiology:    Latest Ref Rng & Units 09/06/2021   12:07 PM 03/27/2021   10:42 AM 02/15/2021   11:05 AM  CBC  WBC 4.0 - 10.5 K/uL 4.1  5.2  5.4   Hemoglobin 12.0 - 15.0 g/dL 12.3  13.1  9.5   Hematocrit 36.0 - 46.0 % 37.0  40.2  29.2   Platelets 150 - 400 K/uL 186  247  357     Lab Results  Component Value Date   NA 136 09/06/2021   K 3.7 09/06/2021   CL 108 09/06/2021   CO2 22 09/06/2021      Assessment/Plan: Headache with blurry vision/vertigo/unsteady gait: No evidence of MRI neuroimaging-has history of migraines-highly  suspicious for his symptoms to be from a atypical migraine variant.  Her headache/blurry vision and vertigo have completely resolved as of this morning. Since posterior circulation TIA and differential-work-up in progress.  EEG negative for seizures.  Await further recommendations from stroke MD.  Syncope: Occurred a couple of days back-telemetry negative-awaiting echo.  HTN: BP stable-continue lisinopril  DM-2: CBG stable-continue SSI-metformin on hold  Recent Labs    09/07/21 0208 09/07/21 0742  GLUCAP 147* 99    History of bipolar disorder/PTSD/ADHD/anxiety: Stable-continue Adderall/Klonopin/gabapentin/Lamictal and trazodone.  Obesity: Estimated body mass index is 32.59 kg/m as calculated from the following:   Height as of this encounter: '5\' 3"'$  (1.6 m).   Weight as of this encounter: 83.5 kg.   Code status:   Code Status: Full Code   DVT Prophylaxis: enoxaparin (LOVENOX) injection 40 mg Start: 09/07/21 1130 SCD's Start: 09/07/21 0045   Family Communication: None at bedside   Disposition Plan: Status is: Observation The patient remains OBS appropriate and will d/c before 2 midnights.  Migraine variant versus TIA-work-up in progress-awaiting echo-stroke MD evaluation before consideration of discharge.   Planned Discharge Destination:Home   Diet: Diet Order             Diet Carb Modified Fluid consistency: Thin; Room service appropriate? Yes  Diet effective now                     Antimicrobial agents: Anti-infectives (From admission, onward)    None  MEDICATIONS: Scheduled Meds:  amphetamine-dextroamphetamine  5 mg Oral Daily   clonazePAM  1 mg Oral QHS   clopidogrel  75 mg Oral Daily   enoxaparin (LOVENOX) injection  40 mg Subcutaneous Q24H   gabapentin  300 mg Oral TID   insulin aspart  0-5 Units Subcutaneous QHS   insulin aspart  0-9 Units Subcutaneous TID WC   lamoTRIgine  200 mg Oral BID   lisinopril  10 mg Oral Daily   sertraline   25 mg Oral QHS   traZODone  50 mg Oral QHS   Continuous Infusions:   PRN Meds:.acetaminophen **OR** acetaminophen (TYLENOL) oral liquid 160 mg/5 mL **OR** acetaminophen, clonazePAM, senna-docusate   I have personally reviewed following labs and imaging studies  LABORATORY DATA: CBC: Recent Labs  Lab 09/06/21 1207  WBC 4.1  NEUTROABS 3.1  HGB 12.3  HCT 37.0  MCV 89.2  PLT 182    Basic Metabolic Panel: Recent Labs  Lab 09/06/21 1207 09/06/21 2003  NA 136  --   K 3.7  --   CL 108  --   CO2 22  --   GLUCOSE 106*  --   BUN 21  --   CREATININE 0.91  --   CALCIUM 7.6*  --   MG  --  2.4    GFR: Estimated Creatinine Clearance: 57.2 mL/min (by C-G formula based on SCr of 0.91 mg/dL).  Liver Function Tests: Recent Labs  Lab 09/06/21 1207  AST 14*  ALT 12  ALKPHOS 56  BILITOT 0.6  PROT 5.7*  ALBUMIN 3.6   No results for input(s): "LIPASE", "AMYLASE" in the last 168 hours. No results for input(s): "AMMONIA" in the last 168 hours.  Coagulation Profile: Recent Labs  Lab 09/06/21 1207  INR 0.9    Cardiac Enzymes: No results for input(s): "CKTOTAL", "CKMB", "CKMBINDEX", "TROPONINI" in the last 168 hours.  BNP (last 3 results) No results for input(s): "PROBNP" in the last 8760 hours.  Lipid Profile: Recent Labs    09/07/21 0123  CHOL 235*  HDL 49  LDLCALC 157*  TRIG 145  CHOLHDL 4.8    Thyroid Function Tests: Recent Labs    09/06/21 1207  TSH 0.810    Anemia Panel: No results for input(s): "VITAMINB12", "FOLATE", "FERRITIN", "TIBC", "IRON", "RETICCTPCT" in the last 72 hours.  Urine analysis: No results found for: "COLORURINE", "APPEARANCEUR", "LABSPEC", "PHURINE", "GLUCOSEU", "HGBUR", "BILIRUBINUR", "KETONESUR", "PROTEINUR", "UROBILINOGEN", "NITRITE", "LEUKOCYTESUR"  Sepsis Labs: Lactic Acid, Venous No results found for: "LATICACIDVEN"  MICROBIOLOGY: No results found for this or any previous visit (from the past 240  hour(s)).  RADIOLOGY STUDIES/RESULTS: EEG adult  Result Date: 09/07/2021 Lora Havens, MD     09/07/2021  7:36 AM Patient Name: Kaitlyn [name] MRN: [id number] Epilepsy Attending: Lora Havens Referring Physician/Provider: Rosalin Hawking, MD Date: 09/07/2021 Duration: 21.13 mins Patient history: 71 y.o. female with PMH of bipolar disorder on Lamictal, diabetes, hypertension admitted for episodes of blurry vision, dizziness, imbalance and questionable nystagmus and HA since Wednesday.  EEG to evaluate for siezure Level of alertness: Awake, asleep AEDs during EEG study: LTG, Clonazepam, GBP Technical aspects: This EEG study was done with scalp electrodes positioned according to the 10-20 International system of electrode placement. Electrical activity was acquired at a sampling rate of '500Hz'$  and reviewed with a high frequency filter of '70Hz'$  and a low frequency filter of '1Hz'$ . EEG data were recorded continuously and digitally stored. Description: The posterior dominant rhythm consists of 7 Hz activity of moderate  voltage (25-35 uV) seen predominantly in posterior head regions, symmetric and reactive to eye opening and eye closing. Sleep was characterized by vertex waves, sleep spindles (12 to 14 Hz), maximal frontocentral region. Hyperventilation and photic stimulation were not performed.   ABNORMALITY - Background slow IMPRESSION: This study is suggestive of mild diffuse encephalopathy, nonspecific etiology. No seizures or epileptiform discharges were seen throughout the recording. Lora Havens   MR BRAIN WO CONTRAST  Result Date: 09/06/2021 CLINICAL DATA:  Neuro deficit, acute, stroke suspected. Waxing and waning diplopia, intermittent blurry vision, dizziness and unsteadiness, pain in upper neck and head, concern for carotid bruit on the left neck. Rule out TIA versus stroke. EXAM: MRI HEAD WITHOUT CONTRAST TECHNIQUE: Multiplanar, multiecho pulse sequences of the brain and surrounding structures were  obtained without intravenous contrast. COMPARISON:  Head and neck CTA 09/06/2021 FINDINGS: Brain: There is no evidence of an acute infarct, intracranial hemorrhage, mass, midline shift, or extra-axial fluid collection. The ventricles and sulci are normal. Scattered punctate T2 hyperintensities in the cerebral white matter are nonspecific but compatible with minimal chronic small vessel ischemic disease, not advanced for age. A chronic lacunar infarct is noted in the left thalamus. A small T2 hyperintense focus in the left midbrain likely reflects a dilated perivascular space. Vascular: Major intracranial vascular flow voids are preserved. Skull and upper cervical spine: Unremarkable bone marrow signal. Sinuses/Orbits: Bilateral cataract extraction. Paranasal sinuses and mastoid air cells are clear. Other: None. IMPRESSION: 1. No acute intracranial abnormality. 2. Minimal chronic small vessel ischemia. Electronically Signed   By: Logan Bores M.D.   On: 09/06/2021 14:49   CT Angio Head W or Wo Contrast  Result Date: 09/06/2021 CLINICAL DATA:  Neuro deficit, acute, stroke suspected; waxing and waning diplopia, intermittent blurry vision, dizziness and unsteadiness, pain in upper neck and head, concern for carotid bruit on the left neck EXAM: CT ANGIOGRAPHY HEAD AND NECK TECHNIQUE: Multidetector CT imaging of the head and neck was performed using the standard protocol during bolus administration of intravenous contrast. Multiplanar CT image reconstructions and MIPs were obtained to evaluate the vascular anatomy. Carotid stenosis measurements (when applicable) are obtained utilizing NASCET criteria, using the distal internal carotid diameter as the denominator. RADIATION DOSE REDUCTION: This exam was performed according to the departmental dose-optimization program which includes automated exposure control, adjustment of the mA and/or kV according to patient size and/or use of iterative reconstruction technique.  CONTRAST:  41m OMNIPAQUE IOHEXOL 350 MG/ML SOLN COMPARISON:  None Available. FINDINGS: CT HEAD Brain: There is no acute intracranial hemorrhage, mass effect, or edema. Gray-white differentiation is preserved. There is no extra-axial fluid collection. Ventricles and sulci are within normal limits in size and configuration. Vascular: No hyperdense vessel. Skull: Calvarium is unremarkable. Sinuses/Orbits: No acute finding. Other: None. Review of the MIP images confirms the above findings CTA NECK Aortic arch: Mild calcified plaque. Great vessel origins are patent. Right carotid system: Patent. Trace calcified plaque at the bifurcation. No stenosis. Left carotid system: Patent.  No stenosis. Vertebral arteries: Patent and codominant.  No stenosis. Skeleton: Degenerative changes of the included spine. Other neck: Unremarkable. Upper chest: Included lungs are clear. Review of the MIP images confirms the above findings CTA HEAD Anterior circulation: Intracranial internal carotid arteries are patent. Anterior and middle cerebral arteries are patent. Posterior circulation: Intracranial vertebral arteries, basilar artery, and posterior cerebral arteries are patent. Left posterior communicating artery is present. Venous sinuses: Patent as allowed by contrast bolus timing. Review of the MIP images  confirms the above findings IMPRESSION: No acute intracranial abnormality. No large vessel occlusion, hemodynamically significant stenosis, or evidence of dissection. Electronically Signed   By: Macy Mis M.D.   On: 09/06/2021 14:30   CT Angio Neck W and/or Wo Contrast  Result Date: 09/06/2021 CLINICAL DATA:  Neuro deficit, acute, stroke suspected; waxing and waning diplopia, intermittent blurry vision, dizziness and unsteadiness, pain in upper neck and head, concern for carotid bruit on the left neck EXAM: CT ANGIOGRAPHY HEAD AND NECK TECHNIQUE: Multidetector CT imaging of the head and neck was performed using the standard  protocol during bolus administration of intravenous contrast. Multiplanar CT image reconstructions and MIPs were obtained to evaluate the vascular anatomy. Carotid stenosis measurements (when applicable) are obtained utilizing NASCET criteria, using the distal internal carotid diameter as the denominator. RADIATION DOSE REDUCTION: This exam was performed according to the departmental dose-optimization program which includes automated exposure control, adjustment of the mA and/or kV according to patient size and/or use of iterative reconstruction technique. CONTRAST:  39m OMNIPAQUE IOHEXOL 350 MG/ML SOLN COMPARISON:  None Available. FINDINGS: CT HEAD Brain: There is no acute intracranial hemorrhage, mass effect, or edema. Gray-white differentiation is preserved. There is no extra-axial fluid collection. Ventricles and sulci are within normal limits in size and configuration. Vascular: No hyperdense vessel. Skull: Calvarium is unremarkable. Sinuses/Orbits: No acute finding. Other: None. Review of the MIP images confirms the above findings CTA NECK Aortic arch: Mild calcified plaque. Great vessel origins are patent. Right carotid system: Patent. Trace calcified plaque at the bifurcation. No stenosis. Left carotid system: Patent.  No stenosis. Vertebral arteries: Patent and codominant.  No stenosis. Skeleton: Degenerative changes of the included spine. Other neck: Unremarkable. Upper chest: Included lungs are clear. Review of the MIP images confirms the above findings CTA HEAD Anterior circulation: Intracranial internal carotid arteries are patent. Anterior and middle cerebral arteries are patent. Posterior circulation: Intracranial vertebral arteries, basilar artery, and posterior cerebral arteries are patent. Left posterior communicating artery is present. Venous sinuses: Patent as allowed by contrast bolus timing. Review of the MIP images confirms the above findings IMPRESSION: No acute intracranial abnormality. No  large vessel occlusion, hemodynamically significant stenosis, or evidence of dissection. Electronically Signed   By: PMacy MisM.D.   On: 09/06/2021 14:30     LOS: 0 days   SOren Binet MD  [facility]    To contact the attending provider between 7A-7P or the covering provider during after hours 7P-7A, please log into the web site [email or web address] and access using universal [place] password for that web site. If you do not have the password, please call the hospital operator.  09/07/2021, 10:37 AM

## 2021-09-07 NOTE — Progress Notes (Signed)
EEG complete - results pending 

## 2021-09-07 NOTE — Evaluation (Signed)
Occupational Therapy Evaluation Patient Details Name: Kaitlyn [name] MRN: [id number] DOB: March 15, 1951 Today's Date: 09/07/2021   History of Present Illness 71 y.o. female Presenting 09/06/21 with blurred vision and dizziness. s/p fall with hitting her head. MRI Brain no acute findings; PMH significant of bipolar d/o, DM2, HTN.   Clinical Impression   Prior to this admission, patient living alone and independent in ADLs and still driving. Currently patient is min guard for ADLs, but presents with cognitive deficits, and difficulties with vision. Patient found tangled in all of her lines and leads upon entry, impulsive throughout session  and would have run into door frame on R without cues to pace when walking, could not engage in dual tasking, did not demonstrate anticipatory awareness,  and uses humor very well to hide congitive impairments. Patient endorses that she manages her medication at home typically. Patient with overshooting and undershooting with saccades on visual assessment, jumpy visual gaze when tested to the L and R, and decreased peripheral vision bilaterally. OT educated patient on BE FAST acronym as well as emphasizing need for 24/7 support at discharge and not to drive until cleared by MD. OT recommending outpatient neuro OT to address functional deficits. OT will continue to follow acutely.        Recommendations for follow up therapy are one component of a multi-disciplinary discharge planning process, led by the attending physician.  Recommendations may be updated based on patient status, additional functional criteria and insurance authorization.   Follow Up Recommendations  Outpatient OT    Assistance Recommended at Discharge Frequent or constant Supervision/Assistance  Patient can return home with the following A little help with walking and/or transfers;A little help with bathing/dressing/bathroom;Assistance with cooking/housework;Direct supervision/assist for medications  management;Direct supervision/assist for financial management;Assist for transportation;Help with stairs or ramp for entrance    Functional Status Assessment  Patient has had a recent decline in their functional status and demonstrates the ability to make significant improvements in function in a reasonable and predictable amount of time.  Equipment Recommendations  None recommended by OT    Recommendations for Other Services       Precautions / Restrictions Precautions Precautions: Fall Restrictions Weight Bearing Restrictions: No      Mobility Bed Mobility Overal bed mobility: Independent                  Transfers Overall transfer level: Needs assistance Equipment used: Straight cane               General transfer comment: improved sit<>stand and ambulation with single point cane      Balance Overall balance assessment: Needs assistance Sitting-balance support: No upper extremity supported, Feet supported Sitting balance-Leahy Scale: Good     Standing balance support: Single extremity supported, During functional activity, Reliant on assistive device for balance Standing balance-Leahy Scale: Fair Standing balance comment: improved balance with use of cane                           ADL either performed or assessed with clinical judgement   ADL Overall ADL's : Needs assistance/impaired Eating/Feeding: Set up;Sitting   Grooming: Wash/dry hands;Wash/dry face;Set up;Standing   Upper Body Bathing: Set up;Sitting   Lower Body Bathing: Min guard;Sit to/from stand;Sitting/lateral leans   Upper Body Dressing : Set up;Sitting;Standing   Lower Body Dressing: Min guard;Sitting/lateral leans;Sit to/from stand   Toilet Transfer: Min guard;Ambulation Toilet Transfer Details (indicate cue type and reason): using cane Toileting-  Clothing Manipulation and Hygiene: Min guard;Sit to/from stand;Sitting/lateral lean       Functional mobility during  ADLs: Min guard;Cueing for sequencing;Cueing for safety;Cane General ADL Comments: Patient presenting with decreased cognition, visual deficits, and cues for safety due to impsulsivity     Vision Baseline Vision/History: 1 Wears glasses (Readers) Ability to See in Adequate Light: 0 Adequate Patient Visual Report: Eye fatigue/eye pain/headache Vision Assessment?: Yes Eye Alignment: Within Functional Limits Ocular Range of Motion: Within Functional Limits (ROM jumpy throughout especially the L and R) Alignment/Gaze Preference: Within Defined Limits Tracking/Visual Pursuits: Decreased smoothness of eye movement to RIGHT superior field;Decreased smoothness of eye movement to LEFT superior field;Decreased smoothness of eye movement to RIGHT inferior field;Decreased smoothness of eye movement to LEFT inferior field Saccades: Overshoots;Undershoots;Decreased speed of saccadic movement (unable to complete saccades appropriately at faster pace) Convergence: Within functional limits Visual Fields: Impaired-to be further tested in functional context;Other (comment) (peripheral vision appears to be impared especially on L, needs further testing)     Perception     Praxis      Pertinent Vitals/Pain Pain Assessment Pain Assessment: No/denies pain     Hand Dominance Right   Extremity/Trunk Assessment Upper Extremity Assessment Upper Extremity Assessment: Overall WFL for tasks assessed   Lower Extremity Assessment Lower Extremity Assessment: Defer to PT evaluation   Cervical / Trunk Assessment Cervical / Trunk Assessment: Normal   Communication Communication Communication: No difficulties   Cognition Arousal/Alertness: Awake/alert Behavior During Therapy: Impulsive Overall Cognitive Status: Impaired/Different from baseline Area of Impairment: Attention, Memory, Following commands, Safety/judgement, Awareness, Problem solving                   Current Attention Level:  Sustained Memory: Decreased recall of precautions, Decreased short-term memory Following Commands: Follows multi-step commands with increased time, Follows one step commands consistently Safety/Judgement: Decreased awareness of safety, Decreased awareness of deficits Awareness: Emergent Problem Solving: Difficulty sequencing, Requires verbal cues General Comments: Patient found tangled in all of her lines and leads upon entry, impulsive throughout session would have run into door frame on R without use to pace when walking, could not engage in dual tasking, did not demonstrate anticipatory awareness, uses humor very well to hide congitive impairments     General Comments       Exercises     Shoulder Instructions      Home Living Family/patient expects to be discharged to:: Private residence Living Arrangements: Alone Available Help at Discharge: Friend(s);Available PRN/intermittently Type of Home: Apartment Home Access: Elevator     Home Layout: One level     Bathroom Shower/Tub: Teacher, early years/pre: Standard Bathroom Accessibility: Yes   Home Equipment: Cane - single point;Grab bars - tub/shower;Grab bars - toilet          Prior Functioning/Environment Prior Level of Function : Independent/Modified Independent;Driving             Mobility Comments: does water aerobics ADLs Comments: was independent prior        OT Problem List: Impaired balance (sitting and/or standing);Impaired vision/perception;Decreased coordination;Decreased cognition;Decreased safety awareness;Decreased knowledge of use of DME or AE;Decreased knowledge of precautions      OT Treatment/Interventions: Self-care/ADL training;Therapeutic exercise;Energy conservation;DME and/or AE instruction;Manual therapy;Therapeutic activities;Cognitive remediation/compensation;Visual/perceptual remediation/compensation;Patient/family education;Balance training    OT Goals(Current goals can be  found in the care plan section) Acute Rehab OT Goals Patient Stated Goal: to get back to my independence OT Goal Formulation: With patient Time For Goal Achievement: 09/21/21  Potential to Achieve Goals: Good ADL Goals Pt Will Transfer to Toilet: Independently;ambulating Pt Will Perform Toileting - Clothing Manipulation and hygiene: Independently;sitting/lateral leans;sit to/from stand Additional ADL Goal #1: Patient will be able to engage in dual tasking activities in order to promote safe discharge home independently. Additional ADL Goal #2: Patient will be able to complete pill box test with 0 errors to safely manage medications at home. Additional ADL Goal #3: Patient will be able to engage in 4-5 step trail making task without cues to reorient to task to promote safe discharge home independently.  OT Frequency: Min 2X/week    Co-evaluation              AM-PAC OT "6 Clicks" Daily Activity     Outcome Measure Help from another person eating meals?: A Little Help from another person taking care of personal grooming?: A Little Help from another person toileting, which includes using toliet, bedpan, or urinal?: A Little Help from another person bathing (including washing, rinsing, drying)?: A Little Help from another person to put on and taking off regular upper body clothing?: A Little Help from another person to put on and taking off regular lower body clothing?: A Little 6 Click Score: 18   End of Session Equipment Utilized During Treatment: Other (comment) Kasandra Knudsen) Nurse Communication: Mobility status  Activity Tolerance: Patient tolerated treatment well Patient left: in bed;with call bell/phone within reach;with nursing/sitter in room  OT Visit Diagnosis: Unsteadiness on feet (R26.81);Other abnormalities of gait and mobility (R26.89);Other symptoms and signs involving cognitive function                Time: 5732-2025 OT Time Calculation (min): 24 min Charges:  OT General  Charges $OT Visit: 1 Visit OT Evaluation $OT Eval Moderate Complexity: 1 Mod OT Treatments $Self Care/Home Management : 8-22 mins  Corinne Ports E. Keylin Ferryman, OTR/L Acute Rehabilitation Services [phone]   Ascencion Dike 09/07/2021, 10:32 AM

## 2021-09-07 NOTE — Consult Note (Signed)
Stroke Neurology Consultation Note  Consult Requested by: Dr. Marylyn Ishihara  Reason for Consult: dizziness, imbalance, ? nystagmus  Consult Date: 09/07/21   The history was obtained from the pt.  During history and examination, all items were able to obtain unless otherwise noted.  History of Present Illness:  Kaitlyn [name] is a 71 y.o. Caucasian female with PMH of bipolar disorder on Lamictal, diabetes, hypertension admitted for episodes of blurry vision, dizziness, imbalance and questionable nystagmus.  Per patient, on Wednesday she was walking in the kitchen had a sudden onset nausea, dizziness, room spinning with halos in her vision, and imbalance.  She tried to sit down but ended up falling backwards and hitting her head, loss of consciousness.  Did not know how long she passed out but her friend told her not too long.  After woke up, she crawled to her bed and went to sleep.  Thursday she woke up feeling fine and did well the whole day.  However, on Friday morning around 9:30 AM she had sudden onset "eye wiggling, jumping around", associate with dizziness, room spinning.  She denies any nausea vomiting, double vision, falling.  EMS was called, she was brought to [facility] ER for evaluation.  Symptoms lasted about 1 hour and resolved. But she also endorsed headache at the back of the head most of the day on Friday. CTA head and neck and MRI brain all negative. She was transferred to [facility] for further evaluation.   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  Not able to take NSAIDs or aspirin due to diverticulosis.  LSN: Wednesday tPA Given: No: NIHSS = 0 now  Past Medical History:  Diagnosis Date   Anemia 02/05/2021   Anxiety    Bipolar 1 disorder (HCC)    Bulging of cervical intervertebral disc    C2   Bulging of thoracic intervertebral disc    t4   Depression    Diabetes mellitus without  complication (HCC)    GI bleed 02/05/2021   Hyperlipidemia    PTSD (post-traumatic stress disorder)    Sad     Past Surgical History:  Procedure Laterality Date   CATARACT EXTRACTION Right 08/11/2017   CATARACT EXTRACTION Left 09/01/2017   Wilsey   JOINT REPLACEMENT  2013   hip    Family History  Problem Relation Age of Onset   Ovarian cancer Mother    Lymphoma Father    Colon cancer Sister     Social History:  reports that she has never smoked. She has never used smokeless tobacco. She reports that she does not drink alcohol and does not use drugs.  Allergies:  Allergies  Allergen Reactions   Aspirin Other (See Comments)   Other Other (See Comments)    "makes my urine turn brown" Other reaction(s): MUSCLE PAIN "makes my urine turn brown"   Oxycodone     Migraines   Statins Other (See Comments)    "makes my urine turn brown" Other reaction(s): MUSCLE PAIN "makes my urine turn brown"    No current facility-administered medications on file prior to encounter.   Current Outpatient Medications on File Prior to Encounter  Medication Sig Dispense Refill   ABILIFY MAINTENA 300 MG SRER injection 300 mg every 28 (twenty-eight) days.     ADDERALL XR 5 MG 24 hr capsule Take 5 mg by mouth  daily.     Ascorbic Acid (VITAMIN C) 1000 MG tablet Take 1,000 mg by mouth 3 (three) times a week.     ASTAXANTHIN PO Take 12 mg by mouth daily as needed (high blood sugar).     Barberry-Oreg Grape-Goldenseal (BERBERINE COMPLEX PO) Take 2 capsules by mouth daily as needed (high blood sugar).     clonazePAM (KLONOPIN) 0.5 MG tablet Take 1 mg by mouth at bedtime.     gabapentin (NEURONTIN) 300 MG capsule TAKE 1 CAPSULE BY MOUTH THREE TIMES DAILY 270 capsule 1   lamoTRIgine (LAMICTAL) 200 MG tablet Take 200 mg by mouth 2 (two) times daily. Takes Brand Name ONLY     lisinopril (ZESTRIL) 10 MG tablet TAKE 1 TABLET(10 MG) BY MOUTH DAILY 90 tablet 1   loratadine  (CLARITIN) 10 MG tablet TAKE 1 TABLET(10 MG) BY MOUTH DAILY 90 tablet 1   metFORMIN (GLUCOPHAGE) 1000 MG tablet Take 1,000 mg by mouth every morning.     ondansetron (ZOFRAN) 4 MG tablet Take 4 mg by mouth 2 (two) times daily as needed for vomiting or nausea.     sertraline (ZOLOFT) 25 MG tablet Take 25 mg by mouth at bedtime.     traZODone (DESYREL) 50 MG tablet Take 50 mg by mouth at bedtime.     Accu-Chek FastClix Lancets MISC Use to test blood sugar three times daily. Dx: E11.9 306 each 11   Continuous Blood Gluc Sensor (FREESTYLE LIBRE 2 SENSOR) MISC USE TO TEST BLOOD SUGAR THREE TIMES DAILY 2 each 5   metFORMIN (GLUCOPHAGE) 500 MG tablet Take 1 tablet (500 mg total) by mouth daily with breakfast. 30 tablet 2   nystatin cream (MYCOSTATIN) Apply 1 application topically 2 (two) times daily. (Patient not taking: Reported on 09/06/2021) 30 g 0    Review of Systems: A full ROS was attempted today and was able to be performed.  Systems assessed include - Constitutional, Eyes, HENT, Respiratory, Cardiovascular, Gastrointestinal, Genitourinary, Integument/breast, Hematologic/lymphatic, Musculoskeletal, Neurological, Behavioral/Psych, Endocrine, Allergic/Immunologic - with pertinent responses as per HPI.  Physical Examination: Temp:  [97.8 F (36.6 C)-98.3 F (36.8 C)] 97.8 F (36.6 C) (06/10 0000) Pulse Rate:  [71-93] 75 (06/10 0000) Resp:  [10-19] 17 (06/10 0000) BP: (103-166)/(63-112) 111/71 (06/10 0000) SpO2:  [92 %-100 %] 93 % (06/10 0000) Weight:  [83.5 kg] 83.5 kg (06/09 1110)  General - well nourished, well developed, in no apparent distress.    Ophthalmologic - fundi not visualized due to noncooperation.    Cardiovascular - regular rhythm and rate  Mental Status -  Level of arousal and orientation to time, place, and person were intact. Language including expression, naming, repetition, comprehension, reading, and writing was assessed and found intact. Fund of Knowledge was  assessed and was intact.  Cranial Nerves II - XII - II - Vision intact OU. III, IV, VI - Extraocular movements intact. V - Facial sensation intact bilaterally. VII - Facial movement intact bilaterally. VIII - Hearing & vestibular intact bilaterally. X - Palate elevates symmetrically. XI - Chin turning & shoulder shrug intact bilaterally. XII - Tongue protrusion intact.  Motor Strength - The patient's strength was normal in all extremities and pronator drift was absent.   Motor Tone & Bulk - Muscle tone was assessed at the neck and appendages and was normal.  Bulk was normal and fasciculations were absent.   Reflexes - The patient's reflexes were normal in all extremities and she had no pathological reflexes.  Sensory - Light touch,  temperature/pinprick were assessed and were normal.    Coordination - The patient had normal movements in the hands and feet with no ataxia or dysmetria.  Tremor was absent.  Gait and Station - deferred  Data Reviewed: MR BRAIN WO CONTRAST  Result Date: 09/06/2021 CLINICAL DATA:  Neuro deficit, acute, stroke suspected. Waxing and waning diplopia, intermittent blurry vision, dizziness and unsteadiness, pain in upper neck and head, concern for carotid bruit on the left neck. Rule out TIA versus stroke. EXAM: MRI HEAD WITHOUT CONTRAST TECHNIQUE: Multiplanar, multiecho pulse sequences of the brain and surrounding structures were obtained without intravenous contrast. COMPARISON:  Head and neck CTA 09/06/2021 FINDINGS: Brain: There is no evidence of an acute infarct, intracranial hemorrhage, mass, midline shift, or extra-axial fluid collection. The ventricles and sulci are normal. Scattered punctate T2 hyperintensities in the cerebral white matter are nonspecific but compatible with minimal chronic small vessel ischemic disease, not advanced for age. A chronic lacunar infarct is noted in the left thalamus. A small T2 hyperintense focus in the left midbrain likely  reflects a dilated perivascular space. Vascular: Major intracranial vascular flow voids are preserved. Skull and upper cervical spine: Unremarkable bone marrow signal. Sinuses/Orbits: Bilateral cataract extraction. Paranasal sinuses and mastoid air cells are clear. Other: None. IMPRESSION: 1. No acute intracranial abnormality. 2. Minimal chronic small vessel ischemia. Electronically Signed   By: Logan Bores M.D.   On: 09/06/2021 14:49   CT Angio Head W or Wo Contrast  Result Date: 09/06/2021 CLINICAL DATA:  Neuro deficit, acute, stroke suspected; waxing and waning diplopia, intermittent blurry vision, dizziness and unsteadiness, pain in upper neck and head, concern for carotid bruit on the left neck EXAM: CT ANGIOGRAPHY HEAD AND NECK TECHNIQUE: Multidetector CT imaging of the head and neck was performed using the standard protocol during bolus administration of intravenous contrast. Multiplanar CT image reconstructions and MIPs were obtained to evaluate the vascular anatomy. Carotid stenosis measurements (when applicable) are obtained utilizing NASCET criteria, using the distal internal carotid diameter as the denominator. RADIATION DOSE REDUCTION: This exam was performed according to the departmental dose-optimization program which includes automated exposure control, adjustment of the mA and/or kV according to patient size and/or use of iterative reconstruction technique. CONTRAST:  29m OMNIPAQUE IOHEXOL 350 MG/ML SOLN COMPARISON:  None Available. FINDINGS: CT HEAD Brain: There is no acute intracranial hemorrhage, mass effect, or edema. Gray-white differentiation is preserved. There is no extra-axial fluid collection. Ventricles and sulci are within normal limits in size and configuration. Vascular: No hyperdense vessel. Skull: Calvarium is unremarkable. Sinuses/Orbits: No acute finding. Other: None. Review of the MIP images confirms the above findings CTA NECK Aortic arch: Mild calcified plaque. Great vessel  origins are patent. Right carotid system: Patent. Trace calcified plaque at the bifurcation. No stenosis. Left carotid system: Patent.  No stenosis. Vertebral arteries: Patent and codominant.  No stenosis. Skeleton: Degenerative changes of the included spine. Other neck: Unremarkable. Upper chest: Included lungs are clear. Review of the MIP images confirms the above findings CTA HEAD Anterior circulation: Intracranial internal carotid arteries are patent. Anterior and middle cerebral arteries are patent. Posterior circulation: Intracranial vertebral arteries, basilar artery, and posterior cerebral arteries are patent. Left posterior communicating artery is present. Venous sinuses: Patent as allowed by contrast bolus timing. Review of the MIP images confirms the above findings IMPRESSION: No acute intracranial abnormality. No large vessel occlusion, hemodynamically significant stenosis, or evidence of dissection. Electronically Signed   By: PMacy MisM.D.   On: 09/06/2021  14:30   CT Angio Neck W and/or Wo Contrast  Result Date: 09/06/2021 CLINICAL DATA:  Neuro deficit, acute, stroke suspected; waxing and waning diplopia, intermittent blurry vision, dizziness and unsteadiness, pain in upper neck and head, concern for carotid bruit on the left neck EXAM: CT ANGIOGRAPHY HEAD AND NECK TECHNIQUE: Multidetector CT imaging of the head and neck was performed using the standard protocol during bolus administration of intravenous contrast. Multiplanar CT image reconstructions and MIPs were obtained to evaluate the vascular anatomy. Carotid stenosis measurements (when applicable) are obtained utilizing NASCET criteria, using the distal internal carotid diameter as the denominator. RADIATION DOSE REDUCTION: This exam was performed according to the departmental dose-optimization program which includes automated exposure control, adjustment of the mA and/or kV according to patient size and/or use of iterative  reconstruction technique. CONTRAST:  32m OMNIPAQUE IOHEXOL 350 MG/ML SOLN COMPARISON:  None Available. FINDINGS: CT HEAD Brain: There is no acute intracranial hemorrhage, mass effect, or edema. Gray-white differentiation is preserved. There is no extra-axial fluid collection. Ventricles and sulci are within normal limits in size and configuration. Vascular: No hyperdense vessel. Skull: Calvarium is unremarkable. Sinuses/Orbits: No acute finding. Other: None. Review of the MIP images confirms the above findings CTA NECK Aortic arch: Mild calcified plaque. Great vessel origins are patent. Right carotid system: Patent. Trace calcified plaque at the bifurcation. No stenosis. Left carotid system: Patent.  No stenosis. Vertebral arteries: Patent and codominant.  No stenosis. Skeleton: Degenerative changes of the included spine. Other neck: Unremarkable. Upper chest: Included lungs are clear. Review of the MIP images confirms the above findings CTA HEAD Anterior circulation: Intracranial internal carotid arteries are patent. Anterior and middle cerebral arteries are patent. Posterior circulation: Intracranial vertebral arteries, basilar artery, and posterior cerebral arteries are patent. Left posterior communicating artery is present. Venous sinuses: Patent as allowed by contrast bolus timing. Review of the MIP images confirms the above findings IMPRESSION: No acute intracranial abnormality. No large vessel occlusion, hemodynamically significant stenosis, or evidence of dissection. Electronically Signed   By: PMacy MisM.D.   On: 09/06/2021 14:30   [place] JO[place] SPACE STRUCTURES UP LEFT  Result Date: 09/03/2021 CLINICAL DATA:  Mass at the tip of the left long finger. Present since 2022. Denies injury. EXAM: ULTRASOUND LEFT UPPER EXTREMITY LIMITED TECHNIQUE: Ultrasound examination of the upper extremity soft tissues was performed in the area of clinical concern. COMPARISON:  None Available. FINDINGS: Focused  ultrasound of the palpable abnormality along the volar distal left long finger demonstrates a round well-defined 1.2 x 1.0 x 1.0 cm containing fairly homogeneous low-level internal echoes without internal vascularity. IMPRESSION: 1. 1.2 cm complex lesion along the volar distal left long finger, likely epidermal inclusion cyst. Electronically Signed   By: WTitus DubinM.D.   On: 09/03/2021 15:42    Assessment: 71y.o. female with PMH of bipolar disorder on Lamictal, diabetes, hypertension admitted for episodes of blurry vision, dizziness, imbalance and questionable nystagmus and HA since Wednesday.  Currently NIHSS =0, neuro intact.  CTA head and neck and MRI brain all negative so far.  Etiology for patient's symptoms not quite clear, concerning for posterior circulation TIA.  DDx also include basilar type complicated migraine and seizure.  We will continue further work-up with EEG and 2D echo, lipid panel and A1c.  Put on Plavix.  Patient not to take aspirin given diverticulosis and previous GI bleeding.  Stroke Risk Factors - diabetes mellitus, hyperlipidemia, and hypertension  Plan: Continue further stroke work up  Frequent neuro checks Telemetry monitoring Routine EEG in a.m. Echocardiogram  UDS, fasting lipid panel and HgbA1C Put on Plavix.  Per patient, she cannot take aspirin or NSAIDs due to diverticulosis and previous history of GI bleeding Patient also reports statin intolerance.  Will decide on further regimen based on lipid panel results. stroke risk factor modification Will follow   Thank you for this consultation and allowing [place] to participate in the care of this patient.  Rosalin Hawking, MD PhD Stroke Neurology 09/07/2021 1:20 AM

## 2021-09-07 NOTE — Discharge Summary (Signed)
PATIENT DETAILS Name: Kaitlyn Kaitlyn [name] Age: 71 y.o. Sex: female Date of Birth: Jun 05, 1950 MRN: [id number]. Admitting Physician: Kaitlyn Finner, DO KNL:[id number], Kaitlyn Bucks, NP  Admit Date: 09/06/2021 Discharge date: 09/07/2021  Recommendations for Outpatient Follow-up:  Follow up with PCP in 1-2 weeks Please obtain CMP/CBC in one week  Admitted From:  Home  Disposition: Outpatient PT   Discharge Condition: good  Kaitlyn [name] STATUS:   Kaitlyn [name] Status: Full Kaitlyn [name]   Diet recommendation:  Diet Order             Diet - low sodium heart healthy           Diet Carb Modified Fluid consistency: Thin; Room service appropriate? Yes  Diet effective now                    Brief Summary: Patient is a 71 y.o.  female DM-2, HTN, bipolar disorder, migraine headaches-presented with blurry vision/vertigo/unsteady gait/headache-subsequently admitted to [facility] for further evaluation and treatment.   Significant events: 6/09>> admit to [facility]-vertigo/blurry vision/unsteady gait/headache   Significant studies: 6/09>> CTA head/neck: No LVO/significant stenosis/dissection 6/09>> MRI brain: No acute CVA 6/09>> UDS: Positive for amphetamines (on Adderall) 6/09>> TSH: 0.810 6/10>> A1c: 6.3 6/10>> LDL: 157 6/10>> EEG: No seizures 6/10>>TTE:EF 55-60%  Brief Hospital Course: Headache with blurry vision/vertigo/unsteady gait: No evidence of  CVA on MRI neuroimaging-has history of migraines-highly suspicious for his symptoms to be from a atypical migraine variant.  Her headache/blurry vision and vertigo have completely resolved as of this morning. Since posterior circulation TIA was in there differential-underwent work-up. Evaluated by Kaitlyn Kaitlyn [name] Kaitlyn [name]-recommendations are to continue with Plavix on discharge.Per Neuro-patient intolerant to statin-hence will be continued on Zetia.   Syncope: Occurred a couple of days back-telemetry negative-Echo with stable EF HTN: BP stable-continue lisinopril   DM-2: CBG  stable-continue SSI-metformin will be resumed on discharge  History of bipolar disorder/PTSD/ADHD/anxiety: Stable-continue Adderall/Klonopin/gabapentin/Lamictal and trazodone.   Obesity: Estimated body mass index is 32.59 kg/m as calculated from the following:   Height as of this encounter: '5\' 3"'$  (1.6 m).   Weight as of this encounter: 83.5 kg.    Discharge Diagnoses:  Principal Problem:   TIA (transient ischemic attack) Active Problems:   Bipolar affective disorder (Kaitlyn Kaitlyn [name])   Essential hypertension   Posttraumatic stress disorder   DM2 (diabetes mellitus, type 2) (HCC)   GERD (gastroesophageal reflux disease)   Hypocalcemia   Discharge Instructions:  Activity:  As tolerated   Discharge Instructions     Ambulatory referral to Neurology   Complete by: As directed    An appointment is requested in approximately: 8 weeks   Call Kaitlyn [name] for:  extreme fatigue   Complete by: As directed    Call Kaitlyn [name] for:  persistant dizziness or light-headedness   Complete by: As directed    Diet - low sodium heart healthy   Complete by: As directed    Discharge instructions   Complete by: As directed    Follow with Kaitlyn Kaitlyn [name]  Ngetich, Dinah C, NP in 1-2 weeks  Please get a complete blood count and chemistry panel checked by your Kaitlyn Kaitlyn [name] at your next visit, and again as instructed by your Kaitlyn Kaitlyn [name].  Get Medicines reviewed and adjusted: Please take all your medications with you for your next visit with your Kaitlyn Kaitlyn [name]  Laboratory/radiological data: Please request your Kaitlyn Kaitlyn [name] to go over all hospital tests and procedure/radiological results at the follow up, please ask your Kaitlyn Kaitlyn [name] to get  all Hospital records sent to his/her office.  In some cases, they will be blood work, cultures and biopsy results pending at the time of your discharge. Please request that your Kaitlyn care M.D. follows up on these results.  Also Note the following: If you experience worsening of your admission  symptoms, develop shortness of breath, life threatening emergency, suicidal or homicidal thoughts you must seek medical attention immediately by calling 911 or calling your Kaitlyn [name] immediately  if symptoms less severe.  You must read complete instructions/literature along with all the possible adverse reactions/side effects for all the Medicines you take and that have been prescribed to you. Take any new Medicines after you have completely understood and accpet all the possible adverse reactions/side effects.   Do not drive when taking Pain medications or sleeping medications (Benzodaizepines)  Do not take more than prescribed Pain, Sleep and Anxiety Medications. It is not advisable to combine anxiety,sleep and pain medications without talking with your Kaitlyn care practitioner  Special Instructions: If you have smoked or chewed Tobacco  in the last 2 yrs please stop smoking, stop any regular Alcohol  and or any Recreational drug use.  Wear Seat belts while driving.  Please note: You were cared for by a hospitalist during your hospital stay. Once you are discharged, your Kaitlyn care physician will handle any further medical issues. Please note that NO REFILLS for any discharge medications will be authorized once you are discharged, as it is imperative that you return to your Kaitlyn care physician (or establish a relationship with a Kaitlyn care physician if you do not have one) for your post hospital discharge needs so that they can reassess your need for medications and monitor your lab values.   Increase activity slowly   Complete by: As directed       Allergies as of 09/07/2021       Reactions   Aspirin Other (See Comments)   Other Other (See Comments)   "makes my urine turn brown" Other reaction(s): MUSCLE PAIN "makes my urine turn brown"   Oxycodone    Migraines   Statins Other (See Comments)   "makes my urine turn brown" Other reaction(s): MUSCLE PAIN "makes my urine turn brown"         Medication List     STOP taking these medications    nystatin cream Commonly known as: MYCOSTATIN       TAKE these medications    Abilify Maintena 300 MG Srer injection Generic drug: ARIPiprazole ER 300 mg every 28 (twenty-eight) days.   Accu-Chek FastClix Lancets Misc Use to test blood sugar three times daily. Dx: E11.9   Adderall XR 5 MG 24 hr capsule Generic drug: amphetamine-dextroamphetamine Take 5 mg by mouth daily.   ASTAXANTHIN PO Take 12 mg by mouth daily as needed (high blood sugar).   BERBERINE COMPLEX PO Take 2 capsules by mouth daily as needed (high blood sugar).   clonazePAM 0.5 MG tablet Commonly known as: KLONOPIN Take 1 mg by mouth at bedtime.   clopidogrel 75 MG tablet Commonly known as: PLAVIX Take 1 tablet (75 mg total) by mouth daily. Start taking on: September 08, 2021   ezetimibe 10 MG tablet Commonly known as: ZETIA Take 1 tablet (10 mg total) by mouth daily.   FreeStyle Libre 2 Sensor Misc USE TO TEST BLOOD SUGAR THREE TIMES DAILY   gabapentin 300 MG capsule Commonly known as: NEURONTIN TAKE 1 CAPSULE BY MOUTH THREE TIMES DAILY   lamoTRIgine 200 MG  tablet Commonly known as: LAMICTAL Take 200 mg by mouth 2 (two) times daily. Takes Brand Name ONLY   lisinopril 10 MG tablet Commonly known as: ZESTRIL TAKE 1 TABLET(10 MG) BY MOUTH DAILY   loratadine 10 MG tablet Commonly known as: CLARITIN TAKE 1 TABLET(10 MG) BY MOUTH DAILY   metFORMIN 1000 MG tablet Commonly known as: GLUCOPHAGE Take 1,000 mg by mouth every morning. What changed: Another medication with the same name was removed. Continue taking this medication, and follow the directions you see here.   ondansetron 4 MG tablet Commonly known as: ZOFRAN Take 4 mg by mouth 2 (two) times daily as needed for vomiting or nausea.   pantoprazole 40 MG tablet Commonly known as: Protonix Take 1 tablet (40 mg total) by mouth daily.   sertraline 25 MG tablet Commonly known  as: ZOLOFT Take 25 mg by mouth at bedtime.   traZODone 50 MG tablet Commonly known as: DESYREL Take 50 mg by mouth at bedtime.   vitamin [name] 1000 MG tablet Take 1,000 mg by mouth 3 (three) times a week.        Follow-up Information     Ngetich, Dinah C, NP Follow up in 1 week(s).   Specialty: Family Medicine Contact information: [place] [phone] [phone]         [facility] Follow up.   Why: Office will call with date/time, If you dont hear from them,please give them a call Contact information: [place] [phone] [phone]               Allergies  Allergen Reactions   Aspirin Other (See Comments)   Other Other (See Comments)    "makes my urine turn brown" Other reaction(s): MUSCLE PAIN "makes my urine turn brown"   Oxycodone     Migraines   Statins Other (See Comments)    "makes my urine turn brown" Other reaction(s): MUSCLE PAIN "makes my urine turn brown"     Other Procedures/Studies: ECHOCARDIOGRAM COMPLETE  Result Date: 09/07/2021    ECHOCARDIOGRAM REPORT   Patient Name:   Kaitlyn Kaitlyn [name] Date of Exam: 09/07/2021 Medical Rec #:  [id number]    Height:       63.0 in Accession #:    [id number]   Weight:       184.0 lb Date of Birth:  January 22, 1951    BSA:          1.866 m Patient Age:    7 years     BP:           111/65 mmHg Patient Gender: F            HR:           78 bpm. Exam Location:  Inpatient Procedure: 2D Echo, Cardiac Doppler and Color Doppler Indications:    TIA  History:        Patient has no prior history of Echocardiogram examinations.                 Risk Factors:Hypertension and Diabetes.  Sonographer:    Clayton Lefort RDCS (AE) Referring Phys: [id number] Kaitlyn Kaitlyn [name]  Sonographer Comments: Suboptimal subcostal window. IMPRESSIONS  1. Left ventricular ejection fraction, by estimation, is 55 to 60%. The left ventricle has normal function. The left ventricle has no  regional wall motion abnormalities. There is mild left ventricular hypertrophy. Left ventricular diastolic parameters are consistent with Grade  I diastolic dysfunction (impaired relaxation).  2. Right ventricular systolic function is normal. The right ventricular size is normal. Tricuspid regurgitation signal is inadequate for assessing PA pressure.  3. The mitral valve is normal in structure. Trivial mitral valve regurgitation. No evidence of mitral stenosis.  4. The aortic valve is grossly normal. There is mild calcification of the aortic valve. Aortic valve regurgitation is not visualized. Aortic valve sclerosis/calcification is present, without any evidence of aortic stenosis. Conclusion(s)/Recommendation(s): No intracardiac source of embolism detected on this transthoracic study. Consider a transesophageal echocardiogram to exclude cardiac source of embolism if clinically indicated. FINDINGS  Left Ventricle: Left ventricular ejection fraction, by estimation, is 55 to 60%. The left ventricle has normal function. The left ventricle has no regional wall motion abnormalities. The left ventricular internal cavity size was normal in size. There is  mild left ventricular hypertrophy. Left ventricular diastolic parameters are consistent with Grade I diastolic dysfunction (impaired relaxation). Right Ventricle: The right ventricular size is normal. No increase in right ventricular wall thickness. Right ventricular systolic function is normal. Tricuspid regurgitation signal is inadequate for assessing PA pressure. Left Atrium: Left atrial size was normal in size. Right Atrium: Right atrial size was normal in size. Pericardium: Trivial pericardial effusion is present. Presence of epicardial fat layer. Mitral Valve: The mitral valve is normal in structure. Trivial mitral valve regurgitation. No evidence of mitral valve stenosis. Tricuspid Valve: The tricuspid valve is normal in structure. Tricuspid valve regurgitation is  trivial. No evidence of tricuspid stenosis. Aortic Valve: The aortic valve is grossly normal. There is mild calcification of the aortic valve. Aortic valve regurgitation is not visualized. Aortic valve sclerosis/calcification is present, without any evidence of aortic stenosis. Aortic valve mean gradient measures 4.0 mmHg. Aortic valve peak gradient measures 6.7 mmHg. Aortic valve area, by VTI measures 2.20 cm. Pulmonic Valve: The pulmonic valve was not well visualized. Pulmonic valve regurgitation is trivial. No evidence of pulmonic stenosis. Aorta: The aortic root is normal in size and structure. Venous: The inferior vena cava was not well visualized. IAS/Shunts: No atrial level shunt detected by color flow Doppler.  LEFT VENTRICLE PLAX 2D LVIDd:         4.10 cm   Diastology LVIDs:         2.80 cm   LV e' medial:    7.40 cm/s LV PW:         1.00 cm   LV E/e' medial:  9.7 LV IVS:        1.00 cm   LV e' lateral:   7.94 cm/s LVOT diam:     2.10 cm   LV E/e' lateral: 9.1 LV SV:         60 LV SV Index:   32 LVOT Area:     3.46 cm  RIGHT VENTRICLE RV Basal diam:  2.40 cm RV S prime:     10.40 cm/s TAPSE (M-mode): 2.2 cm LEFT ATRIUM             Index        RIGHT ATRIUM           Index LA diam:        3.00 cm 1.61 cm/m   RA Area:     10.70 cm LA Vol (A2C):   48.2 ml 25.83 ml/m  RA Volume:   20.80 ml  11.14 ml/m LA Vol (A4C):   36.3 ml 19.45 ml/m LA Biplane Vol: 41.6 ml 22.29 ml/m  AORTIC VALVE AV  Area (Vmax):    2.23 cm AV Area (Vmean):   2.12 cm AV Area (VTI):     2.20 cm AV Vmax:           129.00 cm/s AV Vmean:          89.000 cm/s AV VTI:            0.274 m AV Peak Grad:      6.7 mmHg AV Mean Grad:      4.0 mmHg LVOT Vmax:         82.90 cm/s LVOT Vmean:        54.600 cm/s LVOT VTI:          0.174 m LVOT/AV VTI ratio: 0.64  AORTA Ao Root diam: 3.30 cm MITRAL VALVE MV Area (PHT): 3.60 cm    SHUNTS MV Decel Time: 211 msec    Systemic VTI:  0.17 m MV E velocity: 72.10 cm/s  Systemic Diam: 2.10 cm MV A  velocity: 93.20 cm/s MV E/A ratio:  0.77 Kaitlyn Kaitlyn [name] Electronically signed by Kaitlyn Kaitlyn [name] Signature Date/Time: 09/07/2021/3:04:06 PM    Final    EEG adult  Result Date: 09/07/2021 Lora Havens, Kaitlyn [name]     09/07/2021  7:36 AM Patient Name: Judaea Burgoon MRN: [id number] Epilepsy Attending: Lora Havens Referring Physician/Provider: Rosalin Hawking, Kaitlyn [name] Date: 09/07/2021 Duration: 21.13 mins Patient history: 71 y.o. female with PMH of bipolar disorder on Lamictal, diabetes, hypertension admitted for episodes of blurry vision, dizziness, imbalance and questionable nystagmus and HA since Wednesday.  EEG to evaluate for siezure Level of alertness: Awake, asleep AEDs during EEG study: LTG, Clonazepam, GBP Technical aspects: This EEG study was done with scalp electrodes positioned according to the 10-20 International system of electrode placement. Electrical activity was acquired at a sampling rate of '500Hz'$  and reviewed with a high frequency filter of '70Hz'$  and a low frequency filter of '1Hz'$ . EEG data were recorded continuously and digitally stored. Description: The posterior dominant rhythm consists of 7 Hz activity of moderate voltage (25-35 uV) seen predominantly in posterior head regions, symmetric and reactive to eye opening and eye closing. Sleep was characterized by vertex waves, sleep spindles (12 to 14 Hz), maximal frontocentral region. Hyperventilation and photic stimulation were not performed.   ABNORMALITY - Background slow IMPRESSION: This study is suggestive of mild diffuse encephalopathy, nonspecific etiology. No seizures or epileptiform discharges were seen throughout the recording. Lora Havens   MR BRAIN WO CONTRAST  Result Date: 09/06/2021 CLINICAL DATA:  Neuro deficit, acute, stroke suspected. Waxing and waning diplopia, intermittent blurry vision, dizziness and unsteadiness, pain in upper neck and head, concern for carotid bruit on the left neck. Rule out TIA versus stroke. EXAM: MRI  HEAD WITHOUT CONTRAST TECHNIQUE: Multiplanar, multiecho pulse sequences of the brain and surrounding structures were obtained without intravenous contrast. COMPARISON:  Head and neck CTA 09/06/2021 FINDINGS: Brain: There is no evidence of an acute infarct, intracranial hemorrhage, mass, midline shift, or extra-axial fluid collection. The ventricles and sulci are normal. Scattered punctate T2 hyperintensities in the cerebral white matter are nonspecific but compatible with minimal chronic small vessel ischemic disease, not advanced for age. A chronic lacunar infarct is noted in the left thalamus. A small T2 hyperintense focus in the left midbrain likely reflects a dilated perivascular space. Vascular: Major intracranial vascular flow voids are preserved. Skull and upper cervical spine: Unremarkable bone marrow signal. Sinuses/Orbits: Bilateral cataract extraction. Paranasal sinuses and mastoid air cells are clear. Other: None.  IMPRESSION: 1. No acute intracranial abnormality. 2. Minimal chronic small vessel ischemia. Electronically Signed   By: Logan Bores M.D.   On: 09/06/2021 14:49   CT Angio Head W or Wo Contrast  Result Date: 09/06/2021 CLINICAL DATA:  Neuro deficit, acute, stroke suspected; waxing and waning diplopia, intermittent blurry vision, dizziness and unsteadiness, pain in upper neck and head, concern for carotid bruit on the left neck EXAM: CT ANGIOGRAPHY HEAD AND NECK TECHNIQUE: Multidetector CT imaging of the head and neck was performed using the standard protocol during bolus administration of intravenous contrast. Multiplanar CT image reconstructions and MIPs were obtained to evaluate the vascular anatomy. Carotid stenosis measurements (when applicable) are obtained utilizing NASCET criteria, using the distal internal carotid diameter as the denominator. RADIATION DOSE REDUCTION: This exam was performed according to the departmental dose-optimization program which includes automated exposure  control, adjustment of the mA and/or kV according to patient size and/or use of iterative reconstruction technique. CONTRAST:  60m OMNIPAQUE IOHEXOL 350 MG/ML SOLN COMPARISON:  None Available. FINDINGS: CT HEAD Brain: There is no acute intracranial hemorrhage, mass effect, or edema. Gray-white differentiation is preserved. There is no extra-axial fluid collection. Ventricles and sulci are within normal limits in size and configuration. Vascular: No hyperdense vessel. Skull: Calvarium is unremarkable. Sinuses/Orbits: No acute finding. Other: None. Review of the MIP images confirms the above findings CTA NECK Aortic arch: Mild calcified plaque. Great vessel origins are patent. Right carotid system: Patent. Trace calcified plaque at the bifurcation. No stenosis. Left carotid system: Patent.  No stenosis. Vertebral arteries: Patent and codominant.  No stenosis. Skeleton: Degenerative changes of the included spine. Other neck: Unremarkable. Upper chest: Included lungs are clear. Review of the MIP images confirms the above findings CTA HEAD Anterior circulation: Intracranial internal carotid arteries are patent. Anterior and middle cerebral arteries are patent. Posterior circulation: Intracranial vertebral arteries, basilar artery, and posterior cerebral arteries are patent. Left posterior communicating artery is present. Venous sinuses: Patent as allowed by contrast bolus timing. Review of the MIP images confirms the above findings IMPRESSION: No acute intracranial abnormality. No large vessel occlusion, hemodynamically significant stenosis, or evidence of dissection. Electronically Signed   By: PMacy MisM.D.   On: 09/06/2021 14:30   CT Angio Neck W and/or Wo Contrast  Result Date: 09/06/2021 CLINICAL DATA:  Neuro deficit, acute, stroke suspected; waxing and waning diplopia, intermittent blurry vision, dizziness and unsteadiness, pain in upper neck and head, concern for carotid bruit on the left neck EXAM: CT  ANGIOGRAPHY HEAD AND NECK TECHNIQUE: Multidetector CT imaging of the head and neck was performed using the standard protocol during bolus administration of intravenous contrast. Multiplanar CT image reconstructions and MIPs were obtained to evaluate the vascular anatomy. Carotid stenosis measurements (when applicable) are obtained utilizing NASCET criteria, using the distal internal carotid diameter as the denominator. RADIATION DOSE REDUCTION: This exam was performed according to the departmental dose-optimization program which includes automated exposure control, adjustment of the mA and/or kV according to patient size and/or use of iterative reconstruction technique. CONTRAST:  756mOMNIPAQUE IOHEXOL 350 MG/ML SOLN COMPARISON:  None Available. FINDINGS: CT HEAD Brain: There is no acute intracranial hemorrhage, mass effect, or edema. Gray-white differentiation is preserved. There is no extra-axial fluid collection. Ventricles and sulci are within normal limits in size and configuration. Vascular: No hyperdense vessel. Skull: Calvarium is unremarkable. Sinuses/Orbits: No acute finding. Other: None. Review of the MIP images confirms the above findings CTA NECK Aortic arch: Mild calcified plaque. [employer]  vessel origins are patent. Right carotid system: Patent. Trace calcified plaque at the bifurcation. No stenosis. Left carotid system: Patent.  No stenosis. Vertebral arteries: Patent and codominant.  No stenosis. Skeleton: Degenerative changes of the included spine. Other neck: Unremarkable. Upper chest: Included lungs are clear. Review of the MIP images confirms the above findings CTA HEAD Anterior circulation: Intracranial internal carotid arteries are patent. Anterior and middle cerebral arteries are patent. Posterior circulation: Intracranial vertebral arteries, basilar artery, and posterior cerebral arteries are patent. Left posterior communicating artery is present. Venous sinuses: Patent as allowed by contrast  bolus timing. Review of the MIP images confirms the above findings IMPRESSION: No acute intracranial abnormality. No large vessel occlusion, hemodynamically significant stenosis, or evidence of dissection. Electronically Signed   By: Macy Mis M.D.   On: 09/06/2021 14:30   [place] LIMITED JOINT SPACE STRUCTURES UP LEFT  Result Date: 09/03/2021 CLINICAL DATA:  Mass at the tip of the left long finger. Present since 2022. Denies injury. EXAM: ULTRASOUND LEFT UPPER EXTREMITY LIMITED TECHNIQUE: Ultrasound examination of the upper extremity soft tissues was performed in the area of clinical concern. COMPARISON:  None Available. FINDINGS: Focused ultrasound of the palpable abnormality along the volar distal left long finger demonstrates a round well-defined 1.2 x 1.0 x 1.0 cm containing fairly homogeneous low-level internal echoes without internal vascularity. IMPRESSION: 1. 1.2 cm complex lesion along the volar distal left long finger, likely epidermal inclusion cyst. Electronically Signed   By: Titus Dubin M.D.   On: 09/03/2021 15:42     TODAY-DAY OF DISCHARGE:  Subjective:   Kaitlyn Kaitlyn [name] today has no headache,no chest abdominal pain,no new weakness tingling or numbness, feels much better wants to go home today.   Objective:   Blood pressure 117/75, pulse 78, temperature 99.2 F (37.3 [name]), temperature source Oral, resp. rate 16, height '5\' 3"'$  (1.6 m), weight 83.5 kg, SpO2 94 %.  Intake/Output Summary (Last 24 hours) at 09/07/2021 1635 Last data filed at 09/06/2021 2101 Gross per 24 hour  Intake 45.59 ml  Output --  Net 45.59 ml   Filed Weights   09/06/21 1110  Weight: 83.5 kg    Exam: Awake Alert, Oriented *3, No new F.N deficits, Normal affect [place].AT,PERRAL Supple Neck,No JVD, No cervical lymphadenopathy appriciated.  Symmetrical Chest wall movement, Good air movement bilaterally, CTAB RRR,No Gallops,Rubs or new Murmurs, No Parasternal Heave +ve B.Sounds, Abd Soft, Non tender, No  organomegaly appriciated, No rebound -guarding or rigidity. No Cyanosis, Clubbing or edema, No new Rash or bruise   PERTINENT RADIOLOGIC STUDIES: ECHOCARDIOGRAM COMPLETE  Result Date: 09/07/2021    ECHOCARDIOGRAM REPORT   Patient Name:   BERNA GITTO Date of Exam: 09/07/2021 Medical Rec #:  [id number]    Height:       63.0 in Accession #:    [id number]   Weight:       184.0 lb Date of Birth:  08-20-1950    BSA:          1.866 m Patient Age:    20 years     BP:           111/65 mmHg Patient Gender: F            HR:           78 bpm. Exam Location:  Inpatient Procedure: 2D Echo, Cardiac Doppler and Color Doppler Indications:    TIA  History:        Patient has no prior history of Echocardiogram  examinations.                 Risk Factors:Hypertension and Diabetes.  Sonographer:    Clayton Lefort RDCS (AE) Referring Phys: [id number] Kaitlyn Kaitlyn [name]  Sonographer Comments: Suboptimal subcostal window. IMPRESSIONS  1. Left ventricular ejection fraction, by estimation, is 55 to 60%. The left ventricle has normal function. The left ventricle has no regional wall motion abnormalities. There is mild left ventricular hypertrophy. Left ventricular diastolic parameters are consistent with Grade I diastolic dysfunction (impaired relaxation).  2. Right ventricular systolic function is normal. The right ventricular size is normal. Tricuspid regurgitation signal is inadequate for assessing PA pressure.  3. The mitral valve is normal in structure. Trivial mitral valve regurgitation. No evidence of mitral stenosis.  4. The aortic valve is grossly normal. There is mild calcification of the aortic valve. Aortic valve regurgitation is not visualized. Aortic valve sclerosis/calcification is present, without any evidence of aortic stenosis. Conclusion(s)/Recommendation(s): No intracardiac source of embolism detected on this transthoracic study. Consider a transesophageal echocardiogram to exclude cardiac source of embolism if clinically  indicated. FINDINGS  Left Ventricle: Left ventricular ejection fraction, by estimation, is 55 to 60%. The left ventricle has normal function. The left ventricle has no regional wall motion abnormalities. The left ventricular internal cavity size was normal in size. There is  mild left ventricular hypertrophy. Left ventricular diastolic parameters are consistent with Grade I diastolic dysfunction (impaired relaxation). Right Ventricle: The right ventricular size is normal. No increase in right ventricular wall thickness. Right ventricular systolic function is normal. Tricuspid regurgitation signal is inadequate for assessing PA pressure. Left Atrium: Left atrial size was normal in size. Right Atrium: Right atrial size was normal in size. Pericardium: Trivial pericardial effusion is present. Presence of epicardial fat layer. Mitral Valve: The mitral valve is normal in structure. Trivial mitral valve regurgitation. No evidence of mitral valve stenosis. Tricuspid Valve: The tricuspid valve is normal in structure. Tricuspid valve regurgitation is trivial. No evidence of tricuspid stenosis. Aortic Valve: The aortic valve is grossly normal. There is mild calcification of the aortic valve. Aortic valve regurgitation is not visualized. Aortic valve sclerosis/calcification is present, without any evidence of aortic stenosis. Aortic valve mean gradient measures 4.0 mmHg. Aortic valve peak gradient measures 6.7 mmHg. Aortic valve area, by VTI measures 2.20 cm. Pulmonic Valve: The pulmonic valve was not well visualized. Pulmonic valve regurgitation is trivial. No evidence of pulmonic stenosis. Aorta: The aortic root is normal in size and structure. Venous: The inferior vena cava was not well visualized. IAS/Shunts: No atrial level shunt detected by color flow Doppler.  LEFT VENTRICLE PLAX 2D LVIDd:         4.10 cm   Diastology LVIDs:         2.80 cm   LV e' medial:    7.40 cm/s LV PW:         1.00 cm   LV E/e' medial:  9.7 LV  IVS:        1.00 cm   LV e' lateral:   7.94 cm/s LVOT diam:     2.10 cm   LV E/e' lateral: 9.1 LV SV:         60 LV SV Index:   32 LVOT Area:     3.46 cm  RIGHT VENTRICLE RV Basal diam:  2.40 cm RV S prime:     10.40 cm/s TAPSE (M-mode): 2.2 cm LEFT ATRIUM  Index        RIGHT ATRIUM           Index LA diam:        3.00 cm 1.61 cm/m   RA Area:     10.70 cm LA Vol (A2C):   48.2 ml 25.83 ml/m  RA Volume:   20.80 ml  11.14 ml/m LA Vol (A4C):   36.3 ml 19.45 ml/m LA Biplane Vol: 41.6 ml 22.29 ml/m  AORTIC VALVE AV Area (Vmax):    2.23 cm AV Area (Vmean):   2.12 cm AV Area (VTI):     2.20 cm AV Vmax:           129.00 cm/s AV Vmean:          89.000 cm/s AV VTI:            0.274 m AV Peak Grad:      6.7 mmHg AV Mean Grad:      4.0 mmHg LVOT Vmax:         82.90 cm/s LVOT Vmean:        54.600 cm/s LVOT VTI:          0.174 m LVOT/AV VTI ratio: 0.64  AORTA Ao Root diam: 3.30 cm MITRAL VALVE MV Area (PHT): 3.60 cm    SHUNTS MV Decel Time: 211 msec    Systemic VTI:  0.17 m MV E velocity: 72.10 cm/s  Systemic Diam: 2.10 cm MV A velocity: 93.20 cm/s MV E/A ratio:  0.77 Kaitlyn Kaitlyn [name] Electronically signed by Kaitlyn Kaitlyn [name] Signature Date/Time: 09/07/2021/3:04:06 PM    Final    EEG adult  Result Date: 09/07/2021 Lora Havens, Kaitlyn [name]     09/07/2021  7:36 AM Patient Name: Kaidyn Hernandes MRN: [id number] Epilepsy Attending: Lora Havens Referring Physician/Provider: Rosalin Hawking, Kaitlyn [name] Date: 09/07/2021 Duration: 21.13 mins Patient history: 71 y.o. female with PMH of bipolar disorder on Lamictal, diabetes, hypertension admitted for episodes of blurry vision, dizziness, imbalance and questionable nystagmus and HA since Wednesday.  EEG to evaluate for siezure Level of alertness: Awake, asleep AEDs during EEG study: LTG, Clonazepam, GBP Technical aspects: This EEG study was done with scalp electrodes positioned according to the 10-20 International system of electrode placement. Electrical activity was acquired  at a sampling rate of '500Hz'$  and reviewed with a high frequency filter of '70Hz'$  and a low frequency filter of '1Hz'$ . EEG data were recorded continuously and digitally stored. Description: The posterior dominant rhythm consists of 7 Hz activity of moderate voltage (25-35 uV) seen predominantly in posterior head regions, symmetric and reactive to eye opening and eye closing. Sleep was characterized by vertex waves, sleep spindles (12 to 14 Hz), maximal frontocentral region. Hyperventilation and photic stimulation were not performed.   ABNORMALITY - Background slow IMPRESSION: This study is suggestive of mild diffuse encephalopathy, nonspecific etiology. No seizures or epileptiform discharges were seen throughout the recording. Lora Havens   MR BRAIN WO CONTRAST  Result Date: 09/06/2021 CLINICAL DATA:  Neuro deficit, acute, stroke suspected. Waxing and waning diplopia, intermittent blurry vision, dizziness and unsteadiness, pain in upper neck and head, concern for carotid bruit on the left neck. Rule out TIA versus stroke. EXAM: MRI HEAD WITHOUT CONTRAST TECHNIQUE: Multiplanar, multiecho pulse sequences of the brain and surrounding structures were obtained without intravenous contrast. COMPARISON:  Head and neck CTA 09/06/2021 FINDINGS: Brain: There is no evidence of an acute infarct, intracranial hemorrhage, mass, midline shift, or extra-axial fluid collection. The ventricles and sulci  are normal. Scattered punctate T2 hyperintensities in the cerebral white matter are nonspecific but compatible with minimal chronic small vessel ischemic disease, not advanced for age. A chronic lacunar infarct is noted in the left thalamus. A small T2 hyperintense focus in the left midbrain likely reflects a dilated perivascular space. Vascular: Major intracranial vascular flow voids are preserved. Skull and upper cervical spine: Unremarkable bone marrow signal. Sinuses/Orbits: Bilateral cataract extraction. Paranasal sinuses and  mastoid air cells are clear. Other: None. IMPRESSION: 1. No acute intracranial abnormality. 2. Minimal chronic small vessel ischemia. Electronically Signed   By: Logan Bores M.D.   On: 09/06/2021 14:49   CT Angio Head W or Wo Contrast  Result Date: 09/06/2021 CLINICAL DATA:  Neuro deficit, acute, stroke suspected; waxing and waning diplopia, intermittent blurry vision, dizziness and unsteadiness, pain in upper neck and head, concern for carotid bruit on the left neck EXAM: CT ANGIOGRAPHY HEAD AND NECK TECHNIQUE: Multidetector CT imaging of the head and neck was performed using the standard protocol during bolus administration of intravenous contrast. Multiplanar CT image reconstructions and MIPs were obtained to evaluate the vascular anatomy. Carotid stenosis measurements (when applicable) are obtained utilizing NASCET criteria, using the distal internal carotid diameter as the denominator. RADIATION DOSE REDUCTION: This exam was performed according to the departmental dose-optimization program which includes automated exposure control, adjustment of the mA and/or kV according to patient size and/or use of iterative reconstruction technique. CONTRAST:  50m OMNIPAQUE IOHEXOL 350 MG/ML SOLN COMPARISON:  None Available. FINDINGS: CT HEAD Brain: There is no acute intracranial hemorrhage, mass effect, or edema. Gray-white differentiation is preserved. There is no extra-axial fluid collection. Ventricles and sulci are within normal limits in size and configuration. Vascular: No hyperdense vessel. Skull: Calvarium is unremarkable. Sinuses/Orbits: No acute finding. Other: None. Review of the MIP images confirms the above findings CTA NECK Aortic arch: Mild calcified plaque. Great vessel origins are patent. Right carotid system: Patent. Trace calcified plaque at the bifurcation. No stenosis. Left carotid system: Patent.  No stenosis. Vertebral arteries: Patent and codominant.  No stenosis. Skeleton: Degenerative changes  of the included spine. Other neck: Unremarkable. Upper chest: Included lungs are clear. Review of the MIP images confirms the above findings CTA HEAD Anterior circulation: Intracranial internal carotid arteries are patent. Anterior and middle cerebral arteries are patent. Posterior circulation: Intracranial vertebral arteries, basilar artery, and posterior cerebral arteries are patent. Left posterior communicating artery is present. Venous sinuses: Patent as allowed by contrast bolus timing. Review of the MIP images confirms the above findings IMPRESSION: No acute intracranial abnormality. No large vessel occlusion, hemodynamically significant stenosis, or evidence of dissection. Electronically Signed   By: PMacy MisM.D.   On: 09/06/2021 14:30   CT Angio Neck W and/or Wo Contrast  Result Date: 09/06/2021 CLINICAL DATA:  Neuro deficit, acute, stroke suspected; waxing and waning diplopia, intermittent blurry vision, dizziness and unsteadiness, pain in upper neck and head, concern for carotid bruit on the left neck EXAM: CT ANGIOGRAPHY HEAD AND NECK TECHNIQUE: Multidetector CT imaging of the head and neck was performed using the standard protocol during bolus administration of intravenous contrast. Multiplanar CT image reconstructions and MIPs were obtained to evaluate the vascular anatomy. Carotid stenosis measurements (when applicable) are obtained utilizing NASCET criteria, using the distal internal carotid diameter as the denominator. RADIATION DOSE REDUCTION: This exam was performed according to the departmental dose-optimization program which includes automated exposure control, adjustment of the mA and/or kV according to patient size and/or use  of iterative reconstruction technique. CONTRAST:  16m OMNIPAQUE IOHEXOL 350 MG/ML SOLN COMPARISON:  None Available. FINDINGS: CT HEAD Brain: There is no acute intracranial hemorrhage, mass effect, or edema. Gray-white differentiation is preserved. There is no  extra-axial fluid collection. Ventricles and sulci are within normal limits in size and configuration. Vascular: No hyperdense vessel. Skull: Calvarium is unremarkable. Sinuses/Orbits: No acute finding. Other: None. Review of the MIP images confirms the above findings CTA NECK Aortic arch: Mild calcified plaque. Great vessel origins are patent. Right carotid system: Patent. Trace calcified plaque at the bifurcation. No stenosis. Left carotid system: Patent.  No stenosis. Vertebral arteries: Patent and codominant.  No stenosis. Skeleton: Degenerative changes of the included spine. Other neck: Unremarkable. Upper chest: Included lungs are clear. Review of the MIP images confirms the above findings CTA HEAD Anterior circulation: Intracranial internal carotid arteries are patent. Anterior and middle cerebral arteries are patent. Posterior circulation: Intracranial vertebral arteries, basilar artery, and posterior cerebral arteries are patent. Left posterior communicating artery is present. Venous sinuses: Patent as allowed by contrast bolus timing. Review of the MIP images confirms the above findings IMPRESSION: No acute intracranial abnormality. No large vessel occlusion, hemodynamically significant stenosis, or evidence of dissection. Electronically Signed   By: PMacy MisM.D.   On: 09/06/2021 14:30     PERTINENT LAB RESULTS: CBC: Recent Labs    09/06/21 1207  WBC 4.1  HGB 12.3  HCT 37.0  PLT 186   CMET CMP     Component Value Date/Time   NA 136 09/06/2021 1207   NA 137 02/10/2021 0000   K 3.7 09/06/2021 1207   CL 108 09/06/2021 1207   CO2 22 09/06/2021 1207   GLUCOSE 106 (H) 09/06/2021 1207   BUN 21 09/06/2021 1207   BUN 13 02/10/2021 0000   CREATININE 0.91 09/06/2021 1207   CREATININE 0.99 11/21/2020 1417   CALCIUM 7.6 (L) 09/06/2021 1207   PROT 5.7 (L) 09/06/2021 1207   ALBUMIN 3.6 09/06/2021 1207   AST 14 (L) 09/06/2021 1207   ALT 12 09/06/2021 1207   ALKPHOS 56 09/06/2021 1207    BILITOT 0.6 09/06/2021 1207   GFRNONAA >60 09/06/2021 1207   GFRNONAA 59 (L) 05/25/2020 0834   GFRAA 68 05/25/2020 0834    GFR Estimated Creatinine Clearance: 57.2 mL/min (by [name]-G formula based on SCr of 0.91 mg/dL). No results for input(s): "LIPASE", "AMYLASE" in the last 72 hours. No results for input(s): "CKTOTAL", "CKMB", "CKMBINDEX", "TROPONINI" in the last 72 hours. Invalid input(s): "POCBNP" No results for input(s): "DDIMER" in the last 72 hours. Recent Labs    09/07/21 0123  HGBA1C 6.3*   Recent Labs    09/07/21 0123  CHOL 235*  HDL 49  LDLCALC 157*  TRIG 145  CHOLHDL 4.8   Recent Labs    09/06/21 1207  TSH 0.810   No results for input(s): "VITAMINB12", "FOLATE", "FERRITIN", "TIBC", "IRON", "RETICCTPCT" in the last 72 hours. Coags: Recent Labs    09/06/21 1207  INR 0.9   Microbiology: No results found for this or any previous visit (from the past 240 hour(s)).  FURTHER DISCHARGE INSTRUCTIONS:  Get Medicines reviewed and adjusted: Please take all your medications with you for your next visit with your Kaitlyn Kaitlyn [name]  Laboratory/radiological data: Please request your Kaitlyn Kaitlyn [name] to go over all hospital tests and procedure/radiological results at the follow up, please ask your Kaitlyn Kaitlyn [name] to get all Hospital records sent to his/her office.  In some cases, they will be  blood work, cultures and biopsy results pending at the time of your discharge. Please request that your Kaitlyn care M.D. goes through all the records of your hospital data and follows up on these results.  Also Note the following: If you experience worsening of your admission symptoms, develop shortness of breath, life threatening emergency, suicidal or homicidal thoughts you must seek medical attention immediately by calling 911 or calling your Kaitlyn [name] immediately  if symptoms less severe.  You must read complete instructions/literature along with all the possible adverse reactions/side effects for  all the Medicines you take and that have been prescribed to you. Take any new Medicines after you have completely understood and accpet all the possible adverse reactions/side effects.   Do not drive when taking Pain medications or sleeping medications (Benzodaizepines)  Do not take more than prescribed Pain, Sleep and Anxiety Medications. It is not advisable to combine anxiety,sleep and pain medications without talking with your Kaitlyn care practitioner  Special Instructions: If you have smoked or chewed Tobacco  in the last 2 yrs please stop smoking, stop any regular Alcohol  and or any Recreational drug use.  Wear Seat belts while driving.  Please note: You were cared for by a hospitalist during your hospital stay. Once you are discharged, your Kaitlyn care physician will handle any further medical issues. Please note that NO REFILLS for any discharge medications will be authorized once you are discharged, as it is imperative that you return to your Kaitlyn care physician (or establish a relationship with a Kaitlyn care physician if you do not have one) for your post hospital discharge needs so that they can reassess your need for medications and monitor your lab values.  Total Time spent coordinating discharge including counseling, education and face to face time equals greater than 30 minutes.  SignedOren Binet 09/07/2021 4:35 PM

## 2021-09-07 NOTE — ED Notes (Signed)
[facility] has arrived to transport pt to [facility], pt alert and oriented, no concerns or questions at this time.

## 2021-09-07 NOTE — Care Management (Signed)
Patient would like to have therapies at office that does dry needling. She will contact PCP Monday for referral once she decides where she would like to go

## 2021-09-07 NOTE — Progress Notes (Signed)
  Echocardiogram 2D Echocardiogram has been performed.  Kaitlyn [name] 09/07/2021, 2:59 PM

## 2021-09-09 DIAGNOSIS — R2232 Localized swelling, mass and lump, left upper limb: Secondary | ICD-10-CM | POA: Diagnosis not present

## 2021-09-10 ENCOUNTER — Other Ambulatory Visit: Payer: Self-pay | Admitting: Orthopedic Surgery

## 2021-09-18 ENCOUNTER — Ambulatory Visit (INDEPENDENT_AMBULATORY_CARE_PROVIDER_SITE_OTHER): Payer: Medicare HMO | Admitting: Family Medicine

## 2021-09-18 ENCOUNTER — Encounter: Payer: Self-pay | Admitting: Family Medicine

## 2021-09-18 VITALS — BP 158/92 | HR 100 | Temp 96.8°F | Ht 63.0 in | Wt 186.0 lb

## 2021-09-18 DIAGNOSIS — E782 Mixed hyperlipidemia: Secondary | ICD-10-CM

## 2021-09-18 DIAGNOSIS — F310 Bipolar disorder, current episode hypomanic: Secondary | ICD-10-CM

## 2021-09-18 DIAGNOSIS — E1159 Type 2 diabetes mellitus with other circulatory complications: Secondary | ICD-10-CM | POA: Diagnosis not present

## 2021-09-18 DIAGNOSIS — I10 Essential (primary) hypertension: Secondary | ICD-10-CM

## 2021-09-18 DIAGNOSIS — Z79899 Other long term (current) drug therapy: Secondary | ICD-10-CM

## 2021-09-20 ENCOUNTER — Other Ambulatory Visit: Payer: Self-pay

## 2021-09-20 ENCOUNTER — Encounter (HOSPITAL_BASED_OUTPATIENT_CLINIC_OR_DEPARTMENT_OTHER): Payer: Self-pay | Admitting: Orthopedic Surgery

## 2021-09-20 NOTE — Progress Notes (Addendum)
Reviewed chart with Dr Desmond Lope. Patient states she has follow up scheduled with neurology in September. Pt also reports not starting Plavix yet. Per Dr Desmond Lope, ok to proceed with surgery as planned at surgery center.

## 2021-09-24 DIAGNOSIS — F4310 Post-traumatic stress disorder, unspecified: Secondary | ICD-10-CM | POA: Diagnosis not present

## 2021-09-24 DIAGNOSIS — F310 Bipolar disorder, current episode hypomanic: Secondary | ICD-10-CM | POA: Diagnosis not present

## 2021-09-24 DIAGNOSIS — E119 Type 2 diabetes mellitus without complications: Secondary | ICD-10-CM | POA: Diagnosis not present

## 2021-09-24 DIAGNOSIS — M5021 Other cervical disc displacement,  high cervical region: Secondary | ICD-10-CM | POA: Diagnosis not present

## 2021-09-24 DIAGNOSIS — M5124 Other intervertebral disc displacement, thoracic region: Secondary | ICD-10-CM | POA: Diagnosis not present

## 2021-09-24 DIAGNOSIS — F419 Anxiety disorder, unspecified: Secondary | ICD-10-CM | POA: Diagnosis not present

## 2021-09-24 DIAGNOSIS — I10 Essential (primary) hypertension: Secondary | ICD-10-CM | POA: Diagnosis not present

## 2021-09-24 DIAGNOSIS — G8929 Other chronic pain: Secondary | ICD-10-CM | POA: Diagnosis not present

## 2021-09-24 DIAGNOSIS — G4700 Insomnia, unspecified: Secondary | ICD-10-CM | POA: Diagnosis not present

## 2021-10-07 ENCOUNTER — Encounter (HOSPITAL_BASED_OUTPATIENT_CLINIC_OR_DEPARTMENT_OTHER): Payer: Self-pay | Admitting: Orthopedic Surgery

## 2021-10-07 ENCOUNTER — Encounter (HOSPITAL_BASED_OUTPATIENT_CLINIC_OR_DEPARTMENT_OTHER)
Admission: RE | Disposition: A | Discharge status: Home / Self Care | Payer: Self-pay | Source: Home / Self Care | Attending: Orthopedic Surgery

## 2021-10-07 ENCOUNTER — Ambulatory Visit (HOSPITAL_BASED_OUTPATIENT_CLINIC_OR_DEPARTMENT_OTHER)
Admission: RE | Admit: 2021-10-07 | Discharge: 2021-10-07 | Disposition: A | Discharge status: Home / Self Care | Payer: Medicare HMO | Attending: Orthopedic Surgery | Admitting: Orthopedic Surgery

## 2021-10-07 ENCOUNTER — Other Ambulatory Visit: Payer: Self-pay

## 2021-10-07 ENCOUNTER — Ambulatory Visit (HOSPITAL_BASED_OUTPATIENT_CLINIC_OR_DEPARTMENT_OTHER): Payer: Medicare HMO | Admitting: Anesthesiology

## 2021-10-07 DIAGNOSIS — E669 Obesity, unspecified: Secondary | ICD-10-CM | POA: Insufficient documentation

## 2021-10-07 DIAGNOSIS — I10 Essential (primary) hypertension: Secondary | ICD-10-CM | POA: Insufficient documentation

## 2021-10-07 DIAGNOSIS — Z7984 Long term (current) use of oral hypoglycemic drugs: Secondary | ICD-10-CM | POA: Diagnosis not present

## 2021-10-07 DIAGNOSIS — R2232 Localized swelling, mass and lump, left upper limb: Secondary | ICD-10-CM | POA: Insufficient documentation

## 2021-10-07 DIAGNOSIS — L720 Epidermal cyst: Secondary | ICD-10-CM

## 2021-10-07 DIAGNOSIS — F418 Other specified anxiety disorders: Secondary | ICD-10-CM | POA: Insufficient documentation

## 2021-10-07 DIAGNOSIS — E119 Type 2 diabetes mellitus without complications: Secondary | ICD-10-CM | POA: Diagnosis not present

## 2021-10-07 DIAGNOSIS — K219 Gastro-esophageal reflux disease without esophagitis: Secondary | ICD-10-CM | POA: Diagnosis not present

## 2021-10-07 DIAGNOSIS — F4310 Post-traumatic stress disorder, unspecified: Secondary | ICD-10-CM | POA: Diagnosis not present

## 2021-10-07 DIAGNOSIS — Z01818 Encounter for other preprocedural examination: Secondary | ICD-10-CM

## 2021-10-07 DIAGNOSIS — M7989 Other specified soft tissue disorders: Secondary | ICD-10-CM | POA: Diagnosis not present

## 2021-10-07 DIAGNOSIS — F319 Bipolar disorder, unspecified: Secondary | ICD-10-CM | POA: Insufficient documentation

## 2021-10-07 HISTORY — DX: Migraine, unspecified, not intractable, without status migrainosus: G43.909

## 2021-10-07 HISTORY — PX: EXCISION METACARPAL MASS: SHX6372

## 2021-10-07 LAB — GLUCOSE, CAPILLARY
Glucose-Capillary: 125 mg/dL — ABNORMAL HIGH (ref 70–99)
Glucose-Capillary: 114 mg/dL — ABNORMAL HIGH (ref 70–99)

## 2021-10-07 SURGERY — EXCISION METACARPAL MASS
Anesthesia: Monitor Anesthesia Care | Site: Finger | Laterality: Left

## 2021-10-07 MED ORDER — CEFAZOLIN SODIUM-DEXTROSE 2-4 GM/100ML-% IV SOLN
INTRAVENOUS | Status: AC
  Filled 2021-10-07: qty 100

## 2021-10-07 MED ORDER — MIDAZOLAM HCL 2 MG/2ML IJ SOLN: 0.5000 mg | Freq: Once | INTRAMUSCULAR | Status: DC | PRN

## 2021-10-07 MED ORDER — ACETAMINOPHEN 500 MG PO TABS
1000.0000 mg | ORAL_TABLET | Freq: Once | ORAL | Status: AC
  Administered 2021-10-07: 1000 mg via ORAL

## 2021-10-07 MED ORDER — PROMETHAZINE HCL 25 MG/ML IJ SOLN: 6.2500 mg | INTRAMUSCULAR | Status: DC | PRN

## 2021-10-07 MED ORDER — MEPERIDINE HCL 25 MG/ML IJ SOLN: 6.2500 mg | INTRAMUSCULAR | Status: DC | PRN

## 2021-10-07 MED ORDER — FENTANYL CITRATE (PF) 100 MCG/2ML IJ SOLN
INTRAMUSCULAR | Status: DC | PRN
  Administered 2021-10-07: 50 ug via INTRAVENOUS

## 2021-10-07 MED ORDER — BUPIVACAINE HCL (PF) 0.25 % IJ SOLN
INTRAMUSCULAR | Status: DC | PRN
  Administered 2021-10-07: 9 mL

## 2021-10-07 MED ORDER — LIDOCAINE HCL (PF) 0.5 % IJ SOLN
INTRAMUSCULAR | Status: DC | PRN
  Administered 2021-10-07: 30 mL via INTRAVENOUS

## 2021-10-07 MED ORDER — ONDANSETRON HCL 4 MG/2ML IJ SOLN
INTRAMUSCULAR | Status: DC | PRN
  Administered 2021-10-07: 4 mg via INTRAVENOUS

## 2021-10-07 MED ORDER — PROPOFOL 500 MG/50ML IV EMUL
INTRAVENOUS | Status: DC | PRN
  Administered 2021-10-07: 75 ug/kg/min via INTRAVENOUS

## 2021-10-07 MED ORDER — CEFAZOLIN SODIUM-DEXTROSE 2-4 GM/100ML-% IV SOLN
2.0000 g | INTRAVENOUS | Status: AC
  Administered 2021-10-07: 2 g via INTRAVENOUS

## 2021-10-07 MED ORDER — FENTANYL CITRATE (PF) 100 MCG/2ML IJ SOLN
INTRAMUSCULAR | Status: AC
  Filled 2021-10-07: qty 2

## 2021-10-07 MED ORDER — ACETAMINOPHEN 500 MG PO TABS
ORAL_TABLET | ORAL | Status: AC
  Filled 2021-10-07: qty 2

## 2021-10-07 MED ORDER — LACTATED RINGERS IV SOLN: INTRAVENOUS | Status: DC

## 2021-10-07 MED ORDER — FENTANYL CITRATE (PF) 100 MCG/2ML IJ SOLN: 25.0000 ug | INTRAMUSCULAR | Status: DC | PRN

## 2021-10-07 MED ORDER — TRAMADOL HCL 50 MG PO TABS: ORAL_TABLET | ORAL | 0 refills | Status: DC

## 2021-10-07 MED ORDER — 0.9 % SODIUM CHLORIDE (POUR BTL) OPTIME
TOPICAL | Status: DC | PRN
  Administered 2021-10-07: 100 mL

## 2021-10-07 SURGICAL SUPPLY — 34 items
APL PRP STRL LF DISP 70% ISPRP (MISCELLANEOUS) ×1
BLADE SURG 15 STRL LF DISP TIS (BLADE) ×2 IMPLANT
BLADE SURG 15 STRL SS (BLADE) ×4
BNDG CMPR 9X4 STRL LF SNTH (GAUZE/BANDAGES/DRESSINGS) ×1
BNDG COHESIVE 1X5 TAN STRL LF (GAUZE/BANDAGES/DRESSINGS) ×1 IMPLANT
BNDG ESMARK 4X9 LF (GAUZE/BANDAGES/DRESSINGS) ×1 IMPLANT
CHLORAPREP W/TINT 26 (MISCELLANEOUS) ×2 IMPLANT
CORD BIPOLAR FORCEPS 12FT (ELECTRODE) ×2 IMPLANT
COVER BACK TABLE 60X90IN (DRAPES) ×2 IMPLANT
COVER MAYO STAND STRL (DRAPES) ×2 IMPLANT
CUFF TOURN SGL QUICK 18X4 (TOURNIQUET CUFF) ×2 IMPLANT
DRAPE EXTREMITY T 121X128X90 (DISPOSABLE) ×2 IMPLANT
DRAPE SURG 17X23 STRL (DRAPES) ×2 IMPLANT
GAUZE SPONGE 4X4 12PLY STRL (GAUZE/BANDAGES/DRESSINGS) ×2 IMPLANT
GAUZE XEROFORM 1X8 LF (GAUZE/BANDAGES/DRESSINGS) ×2 IMPLANT
GLOVE BIO SURGEON STRL SZ7.5 (GLOVE) ×2 IMPLANT
GLOVE BIOGEL PI IND STRL 8 (GLOVE) ×1 IMPLANT
GLOVE BIOGEL PI INDICATOR 8 (GLOVE) ×1
GOWN STRL REUS W/ TWL LRG LVL3 (GOWN DISPOSABLE) ×1 IMPLANT
GOWN STRL REUS W/TWL LRG LVL3 (GOWN DISPOSABLE) ×2
GOWN STRL REUS W/TWL XL LVL3 (GOWN DISPOSABLE) ×2 IMPLANT
NDL HYPO 25X1 1.5 SAFETY (NEEDLE) ×1 IMPLANT
NEEDLE HYPO 25X1 1.5 SAFETY (NEEDLE) ×2 IMPLANT
NS IRRIG 1000ML POUR BTL (IV SOLUTION) ×2 IMPLANT
PACK BASIN DAY SURGERY FS (CUSTOM PROCEDURE TRAY) ×2 IMPLANT
PADDING CAST ABS 4INX4YD NS (CAST SUPPLIES) ×1
PADDING CAST ABS COTTON 4X4 ST (CAST SUPPLIES) ×1 IMPLANT
SPLINT FINGER 3.25 911903 (SOFTGOODS) ×1 IMPLANT
STOCKINETTE 4X48 STRL (DRAPES) ×2 IMPLANT
SUT ETHILON 4 0 PS 2 18 (SUTURE) ×2 IMPLANT
SYR BULB EAR ULCER 3OZ GRN STR (SYRINGE) ×2 IMPLANT
SYR CONTROL 10ML LL (SYRINGE) ×2 IMPLANT
TOWEL GREEN STERILE FF (TOWEL DISPOSABLE) ×4 IMPLANT
UNDERPAD 30X36 HEAVY ABSORB (UNDERPADS AND DIAPERS) ×2 IMPLANT

## 2021-10-07 NOTE — H&P (Signed)
Kaitlyn Wood is an 71 y.o. female.   Chief Complaint: mass HPI: 71 yo female with left long finger mass.  It is bothersome to her.  She wishes to have it removed.  Allergies:  Allergies  Allergen Reactions   Aspirin Other (See Comments)    History of GI bleed   Nsaids     History of GI bleed   Oxycodone     Migraines   Statins Other (See Comments)    "makes my urine turn brown" Other reaction(s): MUSCLE PAIN "makes my urine turn brown"    Past Medical History:  Diagnosis Date   Anemia 02/05/2021   Anxiety    Bipolar 1 disorder (HCC)    Bulging of cervical intervertebral disc    C2   Bulging of thoracic intervertebral disc    t4   Depression    Diabetes mellitus without complication (HCC)    GI bleed 02/05/2021   Hyperlipidemia    Migraine    in hospital 09/06/21--ruled out for stroke   PTSD (post-traumatic stress disorder)    Sad     Past Surgical History:  Procedure Laterality Date   CATARACT EXTRACTION Right 08/11/2017   CATARACT EXTRACTION Left 09/01/2017   [place]   JOINT REPLACEMENT  2013   hip    Family History: Family History  Problem Relation Age of Onset   Ovarian cancer Mother    Lymphoma Father    Colon cancer Sister     Social History:   reports that she has never smoked. She has never used smokeless tobacco. She reports that she does not drink alcohol and does not use drugs.  Medications: Medications Prior to Admission  Medication Sig Dispense Refill   ABILIFY MAINTENA 300 MG SRER injection 300 mg every 28 (twenty-eight) days.     Accu-Chek FastClix Lancets MISC Use to test blood sugar three times daily. Dx: E11.9 306 each 11   ADDERALL XR 5 MG 24 hr capsule Take 5 mg by mouth daily.     Ascorbic Acid (VITAMIN C) 1000 MG tablet Take 1,000 mg by mouth 3 (three) times a week.     ASTAXANTHIN PO Take 12 mg by mouth daily as needed (high blood sugar).     Barberry-Oreg Grape-Goldenseal (BERBERINE COMPLEX PO) Take 2  capsules by mouth daily as needed (high blood sugar).     clonazePAM (KLONOPIN) 0.5 MG tablet Take 1 mg by mouth at bedtime.     Continuous Blood Gluc Sensor (FREESTYLE LIBRE 2 SENSOR) MISC USE TO TEST BLOOD SUGAR THREE TIMES DAILY 2 each 5   gabapentin (NEURONTIN) 300 MG capsule TAKE 1 CAPSULE BY MOUTH THREE TIMES DAILY 270 capsule 1   lamoTRIgine (LAMICTAL) 200 MG tablet Take 200 mg by mouth 2 (two) times daily. Takes Brand Name ONLY     lisinopril (ZESTRIL) 10 MG tablet TAKE 1 TABLET(10 MG) BY MOUTH DAILY 90 tablet 1   loratadine (CLARITIN) 10 MG tablet TAKE 1 TABLET(10 MG) BY MOUTH DAILY 90 tablet 1   metFORMIN (GLUCOPHAGE) 1000 MG tablet Take 1,000 mg by mouth every morning.     ondansetron (ZOFRAN) 4 MG tablet Take 4 mg by mouth 2 (two) times daily as needed for vomiting or nausea.     sertraline (ZOLOFT) 25 MG tablet Take 25 mg by mouth at bedtime.     traZODone (DESYREL) 50 MG tablet Take 50 mg by mouth at bedtime.     clopidogrel (PLAVIX)  75 MG tablet Take 1 tablet (75 mg total) by mouth daily. (Patient not taking: Reported on 09/18/2021) 30 tablet 3   ezetimibe (ZETIA) 10 MG tablet Take 1 tablet (10 mg total) by mouth daily. 30 tablet 3   pantoprazole (PROTONIX) 40 MG tablet Take 1 tablet (40 mg total) by mouth daily. (Patient not taking: Reported on 09/18/2021) 30 tablet 3    Results for orders placed or performed during the hospital encounter of 10/07/21 (from the past 48 hour(s))  Glucose, capillary     Status: Abnormal   Collection Time: 10/07/21 12:55 PM  Result Value Ref Range   Glucose-Capillary 125 (H) 70 - 99 mg/dL    Comment: Glucose reference range applies only to samples taken after fasting for at least 8 hours.    No results found.    Blood pressure (!) 145/78, pulse 86, temperature 98.2 F (36.8 C), temperature source Oral, resp. rate 16, height '5\' 3"'$  (1.6 m), weight 84.7 kg, SpO2 100 %.  General appearance: alert, cooperative, and appears stated age Head:  Normocephalic, without obvious abnormality, atraumatic Neck: supple, symmetrical, trachea midline Extremities: Intact sensation and capillary refill all digits.  +epl/fpl/io.  No wounds.  Pulses: 2+ and symmetric Skin: Skin color, texture, turgor normal. No rashes or lesions Neurologic: Grossly normal Incision/Wound: none  Assessment/Plan Left long finger mass.  Non operative and operative treatment options have been discussed with the patient and patient wishes to proceed with operative treatment. Risks, benefits, and alternatives of surgery have been discussed and the patient agrees with the plan of care.   Kaitlyn Wood 10/07/2021, 1:23 PM

## 2021-10-07 NOTE — Anesthesia Procedure Notes (Signed)
Procedure Name: MAC Date/Time: 10/07/2021 2:44 PM  Performed by: Signe Colt, CRNAPre-anesthesia Checklist: Patient identified, Emergency Drugs available, Suction available, Patient being monitored and Timeout performed Patient Re-evaluated:Patient Re-evaluated prior to induction Oxygen Delivery Method: Simple face mask

## 2021-10-07 NOTE — Op Note (Addendum)
NAME: Kaitlyn Wood MEDICAL RECORD NO: [id number] DATE OF BIRTH: 1950-11-26 FACILITY: [facility] LOCATION: [facility] PHYSICIAN: Tennis Must, MD  OPERATIVE REPORT   DATE OF PROCEDURE: 10/07/21    PREOPERATIVE DIAGNOSIS: Left long finger mass   POSTOPERATIVE DIAGNOSIS: Left long finger mass, inclusion cyst   PROCEDURE: Excision left long finger subfascial mass, 10 mm diameter   SURGEON:  Leanora Cover, M.D.   ASSISTANT: none   ANESTHESIA:  Bier block with sedation   INTRAVENOUS FLUIDS:  Per anesthesia flow sheet.   ESTIMATED BLOOD LOSS:  Minimal.   COMPLICATIONS:  None.   SPECIMENS: Left long finger mass to pathology   TOURNIQUET TIME:    Total Tourniquet Time Documented: Forearm (Left) - 25 minutes Total: Forearm (Left) - 25 minutes    DISPOSITION:  Stable to PACU.   INDICATIONS: 71 year old female has noted a mass in the left long finger pad.  It is bothersome to her.  She wishes to have it removed.  Risks, benefits and alternatives of surgery were discussed including the risks of blood loss, infection, damage to nerves, vessels, tendons, ligaments, bone for surgery, need for additional surgery, complications with wound healing, continued pain, stiffness, , recurrence.  She voiced understanding of these risks and elected to proceed.  OPERATIVE COURSE:  After being identified preoperatively by myself,  the patient and I agreed on the procedure and site of the procedure.  The surgical site was marked.  Surgical consent had been signed. Preoperative IV antibiotic prophylaxis was given. She was transferred to the operating room and placed on the operating table in supine position with the Left upper extremity on an arm board.  Bier block anesthesia was induced by the anesthesiologist.  Left upper extremity was prepped and draped in normal sterile orthopedic fashion.  A surgical pause was performed between the surgeons, anesthesia, and operating room staff and all  were in agreement as to the patient, procedure, and site of procedure.  Tourniquet at the proximal aspect of the forearm had been inflated for the Bier block.  Digital block was performed with quarter percent plain Marcaine to aid in postoperative analgesia.  Incision was made in the pad of the left long finger.  This is carried in subcutaneous tissues by spreading technique.  Bipolar electrocautery is used to obtain hemostasis.  Mass was easily identified.  It was deep to the fascia.  It was whitish in coloration.  The fascia overlying was released and the mass carefully freed up of surrounding soft tissue attachments.  It was removed from the pad of the finger.  It was filled with creamy white fluid.  There was some darker areas.  The wound was explored.  Any remaining of the creamy white substance was removed.  There is no remaining mass noted.  The wound was copiously irrigated with sterile saline.  Was closed with 4-0 nylon in a horizontal mattress fashion.  It was dressed with sterile Xeroform 4 x 4 and wrapped with a Coban dressing lightly.  An AlumaFoam splint was placed and wrapped lightly with Coban dressing.  The tourniquet was deflated at 25 minutes.  Fingertips were pink with brisk capillary refill after deflation of tourniquet.  The operative  drapes were broken down.  The patient was awoken from anesthesia safely.  She was transferred back to the stretcher and taken to PACU in stable condition.  I will see her back in the office in 1 week for postoperative followup.  I will give her  a prescription for Tramadol 50 mg 1-2 tabs PO q6 hours prn pain, dispense # 20.   Leanora Cover, MD Electronically signed, 10/07/21

## 2021-10-07 NOTE — Anesthesia Procedure Notes (Signed)
Anesthesia Regional Block: Bier block (IV Regional)   Pre-Anesthetic Checklist: , timeout performed,  Correct Patient, Correct Site, Correct Laterality,  Correct Procedure,, site marked,  Surgical consent,  At surgeon's request  Laterality: Left         Needles:  Injection technique: Single-shot  Needle Type: Other      Needle Gauge: 20     Additional Needles:   Procedures:,,,,, intact distal pulses, Esmarch exsanguination,  Single tourniquet utilized    Narrative:   Performed by: Personally      

## 2021-10-07 NOTE — Anesthesia Preprocedure Evaluation (Addendum)
Anesthesia Evaluation  Patient identified by MRN, date of birth, ID band Patient awake    Reviewed: Allergy & Precautions, NPO status , Patient's Chart, lab work & pertinent test results  History of Anesthesia Complications Negative for: history of anesthetic complications  Airway Mallampati: II  TM Distance: >3 FB Neck ROM: Full    Dental  (+) Missing, Dental Advisory Given   Pulmonary neg pulmonary ROS,    breath sounds clear to auscultation       Cardiovascular hypertension, Pt. on medications (-) angina Rhythm:Regular Rate:Normal  08/2021 ECHO: EF 55-60%. The LV has normal function, no regional wall motion abnormalities. There is mild LVH.  Grade I DD, normal RVF, no significant valvular abnormalities   Neuro/Psych  Headaches, PSYCHIATRIC DISORDERS (PTSD) Anxiety Depression Bipolar Disorder    GI/Hepatic Neg liver ROS, GERD  Medicated and Controlled,  Endo/Other  diabetes (glu 125), Oral Hypoglycemic Agentsobese  Renal/GU negative Renal ROS     Musculoskeletal  (+) Arthritis ,   Abdominal (+) + obese,   Peds  Hematology negative hematology ROS (+)   Anesthesia Other Findings   Reproductive/Obstetrics                            Anesthesia Physical Anesthesia Plan  ASA: 3  Anesthesia Plan: MAC and Bier Block and Bier Block-LIDOCAINE ONLY   Post-op Pain Management: Tylenol PO (pre-op)*   Induction:   PONV Risk Score and Plan: 2 and Ondansetron and Treatment may vary due to age or medical condition  Airway Management Planned: Natural Airway and Simple Face Mask  Additional Equipment: None  Intra-op Plan:   Post-operative Plan:   Informed Consent: I have reviewed the patients History and Physical, chart, labs and discussed the procedure including the risks, benefits and alternatives for the proposed anesthesia with the patient or authorized representative who has indicated his/her  understanding and acceptance.     Dental advisory given  Plan Discussed with: CRNA and Surgeon  Anesthesia Plan Comments:        Anesthesia Quick Evaluation

## 2021-10-07 NOTE — Anesthesia Postprocedure Evaluation (Signed)
Anesthesia Post Note  Patient: Kaitlyn Wood  Procedure(s) Performed: EXCISION MASS LEFT LONG FINGER (Left: Finger)     Patient location during evaluation: PACU Anesthesia Type: MAC Level of consciousness: awake and alert, patient cooperative and oriented Pain management: pain level controlled Vital Signs Assessment: post-procedure vital signs reviewed and stable Respiratory status: spontaneous breathing, nonlabored ventilation and respiratory function stable Cardiovascular status: stable and blood pressure returned to baseline Postop Assessment: no apparent nausea or vomiting, adequate PO intake and able to ambulate Anesthetic complications: no   No notable events documented.  Last Vitals:  Vitals:   10/07/21 1505 10/07/21 1515  BP: (!) 143/80 (!) 148/135  Pulse: 78 83  Resp: 12 14  Temp: 36.6 C   SpO2: 99% 99%    Last Pain:  Vitals:   10/07/21 1515  TempSrc:   PainSc: 4                  Myndi Wamble,E. Madison Direnzo

## 2021-10-07 NOTE — Discharge Instructions (Addendum)
[facility] Instructions Hand Surgery  Wound Care: Keep your hand elevated above the level of your heart.  Do not allow it to dangle by your side.  Keep the dressing dry and do not remove it unless your doctor advises you to do so.  He will usually change it at the time of your post-op visit.  Moving your fingers is advised to stimulate circulation but will depend on the site of your surgery.  If you have a splint applied, your doctor will advise you regarding movement.  Activity: Do not drive or operate machinery today.  Rest today and then you may return to your normal activity and work as indicated by your physician.  Diet:  Drink liquids today or eat a light diet.  You may resume a regular diet tomorrow.    General expectations: Pain for two to three days. Fingers may become slightly swollen.  Call your doctor if any of the following occur: Severe pain not relieved by pain medication. Elevated temperature. Dressing soaked with blood. Inability to move fingers. White or bluish color to fingers.    Post Anesthesia Home Care Instructions  Activity: Get plenty of rest for the remainder of the day. A responsible individual must stay with you for 24 hours following the procedure.  For the next 24 hours, DO NOT: -Drive a car -Paediatric nurse -Drink alcoholic beverages -Take any medication unless instructed by your physician -Make any legal decisions or sign important papers.  Meals: Start with liquid foods such as gelatin or soup. Progress to regular foods as tolerated. Avoid greasy, spicy, heavy foods. If nausea and/or vomiting occur, drink only clear liquids until the nausea and/or vomiting subsides. Call your physician if vomiting continues.  Special Instructions/Symptoms: Your throat may feel dry or sore from the anesthesia or the breathing tube placed in your throat during surgery. If this causes discomfort, gargle with warm salt water. The discomfort should disappear  within 24 hours.  If you had a scopolamine patch placed behind your ear for the management of post- operative nausea and/or vomiting:  1. The medication in the patch is effective for 72 hours, after which it should be removed.  Wrap patch in a tissue and discard in the trash. Wash hands thoroughly with soap and water. 2. You may remove the patch earlier than 72 hours if you experience unpleasant side effects which may include dry mouth, dizziness or visual disturbances. 3. Avoid touching the patch. Wash your hands with soap and water after contact with the patch.  No tylenol until after 7pm tonight if needed.

## 2021-10-07 NOTE — Transfer of Care (Signed)
Immediate Anesthesia Transfer of Care Note  Patient: Nalia Honeycutt  Procedure(s) Performed: EXCISION MASS LEFT LONG FINGER (Left: Finger)  Patient Location: PACU  Anesthesia Type:Bier block  Level of Consciousness: awake, alert , oriented and patient cooperative  Airway & Oxygen Therapy: Patient Spontanous Breathing and Patient connected to face mask oxygen  Post-op Assessment: Report given to RN and Post -op Vital signs reviewed and stable  Post vital signs: Reviewed and stable  Last Vitals:  Vitals Value Taken Time  BP 143/80 10/07/21 1504  Temp    Pulse 78 10/07/21 1505  Resp 12 10/07/21 1505  SpO2 99 % 10/07/21 1505  Vitals shown include unvalidated device data.  Last Pain:  Vitals:   10/07/21 1251  TempSrc: Oral  PainSc: 0-No pain      Patients Stated Pain Goal: 3 (11/55/20 8022)  Complications: No notable events documented.

## 2021-10-08 ENCOUNTER — Encounter (HOSPITAL_BASED_OUTPATIENT_CLINIC_OR_DEPARTMENT_OTHER): Payer: Self-pay | Admitting: Orthopedic Surgery

## 2021-10-09 LAB — SURGICAL PATHOLOGY

## 2021-10-11 ENCOUNTER — Other Ambulatory Visit: Payer: Self-pay | Admitting: Family

## 2021-10-11 DIAGNOSIS — J302 Other seasonal allergic rhinitis: Secondary | ICD-10-CM

## 2021-10-14 DIAGNOSIS — F3181 Bipolar II disorder: Secondary | ICD-10-CM | POA: Diagnosis not present

## 2021-10-24 DIAGNOSIS — G4700 Insomnia, unspecified: Secondary | ICD-10-CM | POA: Diagnosis not present

## 2021-10-24 DIAGNOSIS — I10 Essential (primary) hypertension: Secondary | ICD-10-CM | POA: Diagnosis not present

## 2021-10-24 DIAGNOSIS — M5021 Other cervical disc displacement,  high cervical region: Secondary | ICD-10-CM | POA: Diagnosis not present

## 2021-10-24 DIAGNOSIS — F310 Bipolar disorder, current episode hypomanic: Secondary | ICD-10-CM | POA: Diagnosis not present

## 2021-10-24 DIAGNOSIS — M5124 Other intervertebral disc displacement, thoracic region: Secondary | ICD-10-CM | POA: Diagnosis not present

## 2021-10-24 DIAGNOSIS — F4310 Post-traumatic stress disorder, unspecified: Secondary | ICD-10-CM | POA: Diagnosis not present

## 2021-10-24 DIAGNOSIS — E119 Type 2 diabetes mellitus without complications: Secondary | ICD-10-CM | POA: Diagnosis not present

## 2021-10-24 DIAGNOSIS — F419 Anxiety disorder, unspecified: Secondary | ICD-10-CM | POA: Diagnosis not present

## 2021-10-24 DIAGNOSIS — G8929 Other chronic pain: Secondary | ICD-10-CM | POA: Diagnosis not present

## 2021-10-25 ENCOUNTER — Other Ambulatory Visit: Payer: Self-pay | Admitting: Family

## 2021-10-25 ENCOUNTER — Ambulatory Visit (INDEPENDENT_AMBULATORY_CARE_PROVIDER_SITE_OTHER): Payer: Medicare HMO | Admitting: Family

## 2021-10-25 ENCOUNTER — Encounter: Payer: Self-pay | Admitting: Family

## 2021-10-25 ENCOUNTER — Other Ambulatory Visit: Payer: Self-pay

## 2021-10-25 DIAGNOSIS — Z0000 Encounter for general adult medical examination without abnormal findings: Secondary | ICD-10-CM

## 2021-10-25 DIAGNOSIS — E1159 Type 2 diabetes mellitus with other circulatory complications: Secondary | ICD-10-CM

## 2021-10-25 MED ORDER — BLOOD GLUCOSE METER KIT: PACK | 0 refills | Status: AC

## 2021-10-25 MED ORDER — ACCU-CHEK FASTCLIX LANCETS MISC: 11 refills | Status: DC

## 2021-10-25 MED ORDER — METFORMIN HCL 500 MG PO TABS: 500.0000 mg | ORAL_TABLET | Freq: Two times a day (BID) | ORAL | 0 refills | Status: DC

## 2021-10-25 NOTE — Progress Notes (Signed)
Would like to take metformin 500 mg twice daily instead of 1000 mg tablet daily.script send to pharmacy.

## 2021-10-25 NOTE — Patient Instructions (Signed)
Kaitlyn Wood , Thank you for taking time to come for your Medicare Wellness Visit. I appreciate your ongoing commitment to your health goals. Please review the following plan we discussed and let me know if I can assist you in the future.   Screening recommendations/referrals: Colonoscopy : Has upcoming appointment  Mammogram : Up to date  Bone Density : Up to date  Recommended yearly ophthalmology/optometry visit for glaucoma screening and checkup Recommended yearly dental visit for hygiene and checkup  Vaccinations: Influenza vaccine : Up to date  Pneumococcal vaccine : Up to date  Tdap vaccine : Up to date  Shingles vaccine : Up to date     Advanced directives: yes   Conditions/risks identified: Advance age female > 32 yrs,Hypertension,BMI > 30,dyslipidemia   Next appointment: 1 year   Preventive Care 72 Years and Older, Female Preventive care refers to lifestyle choices and visits with your health care provider that can promote health and wellness. What does preventive care include? A yearly physical exam. This is also called an annual well check. Dental exams once or twice a year. Routine eye exams. Ask your health care provider how often you should have your eyes checked. Personal lifestyle choices, including: Daily care of your teeth and gums. Regular physical activity. Eating a healthy diet. Avoiding tobacco and drug use. Limiting alcohol use. Practicing safe sex. Taking low-dose aspirin every day. Taking vitamin and mineral supplements as recommended by your health care provider. What happens during an annual well check? The services and screenings done by your health care provider during your annual well check will depend on your age, overall health, lifestyle risk factors, and family history of disease. Counseling  Your health care provider may ask you questions about your: Alcohol use. Tobacco use. Drug use. Emotional well-being. Home and relationship  well-being. Sexual activity. Eating habits. History of falls. Memory and ability to understand (cognition). Work and work Statistician. Reproductive health. Screening  You may have the following tests or measurements: Height, weight, and BMI. Blood pressure. Lipid and cholesterol levels. These may be checked every 5 years, or more frequently if you are over 62 years old. Skin check. Lung cancer screening. You may have this screening every year starting at age 72 if you have a 30-pack-year history of smoking and currently smoke or have quit within the past 15 years. Fecal occult blood test (FOBT) of the stool. You may have this test every year starting at age 72. Flexible sigmoidoscopy or colonoscopy. You may have a sigmoidoscopy every 5 years or a colonoscopy every 10 years starting at age 72. Hepatitis C blood test. Hepatitis B blood test. Sexually transmitted disease (STD) testing. Diabetes screening. This is done by checking your blood sugar (glucose) after you have not eaten for a while (fasting). You may have this done every 1-3 years. Bone density scan. This is done to screen for osteoporosis. You may have this done starting at age 72. Mammogram. This may be done every 1-2 years. Talk to your health care provider about how often you should have regular mammograms. Talk with your health care provider about your test results, treatment options, and if necessary, the need for more tests. Vaccines  Your health care provider may recommend certain vaccines, such as: Influenza vaccine. This is recommended every year. Tetanus, diphtheria, and acellular pertussis (Tdap, Td) vaccine. You may need a Td booster every 10 years. Zoster vaccine. You may need this after age 72. Pneumococcal 13-valent conjugate (PCV13) vaccine. One dose is  recommended after age 72. Pneumococcal polysaccharide (PPSV23) vaccine. One dose is recommended after age 75. Talk to your health care provider about which  screenings and vaccines you need and how often you need them. This information is not intended to replace advice given to you by your health care provider. Make sure you discuss any questions you have with your health care provider. Document Released: 04/13/2015 Document Revised: 12/05/2015 Document Reviewed: 01/16/2015 Elsevier Interactive Patient Education  2017 [employer] Prevention in the Home Falls can cause injuries. They can happen to people of all ages. There are many things you can do to make your home safe and to help prevent falls. What can I do on the outside of my home? Regularly fix the edges of walkways and driveways and fix any cracks. Remove anything that might make you trip as you walk through a door, such as a raised step or threshold. Trim any bushes or trees on the path to your home. Use bright outdoor lighting. Clear any walking paths of anything that might make someone trip, such as rocks or tools. Regularly check to see if handrails are loose or broken. Make sure that both sides of any steps have handrails. Any raised decks and porches should have guardrails on the edges. Have any leaves, snow, or ice cleared regularly. Use sand or salt on walking paths during winter. Clean up any spills in your garage right away. This includes oil or grease spills. What can I do in the bathroom? Use night lights. Install grab bars by the toilet and in the tub and shower. Do not use towel bars as grab bars. Use non-skid mats or decals in the tub or shower. If you need to sit down in the shower, use a plastic, non-slip stool. Keep the floor dry. Clean up any water that spills on the floor as soon as it happens. Remove soap buildup in the tub or shower regularly. Attach bath mats securely with double-sided non-slip rug tape. Do not have throw rugs and other things on the floor that can make you trip. What can I do in the bedroom? Use night lights. Make sure that you have a  light by your bed that is easy to reach. Do not use any sheets or blankets that are too big for your bed. They should not hang down onto the floor. Have a firm chair that has side arms. You can use this for support while you get dressed. Do not have throw rugs and other things on the floor that can make you trip. What can I do in the kitchen? Clean up any spills right away. Avoid walking on wet floors. Keep items that you use a lot in easy-to-reach places. If you need to reach something above you, use a strong step stool that has a grab bar. Keep electrical cords out of the way. Do not use floor polish or wax that makes floors slippery. If you must use wax, use non-skid floor wax. Do not have throw rugs and other things on the floor that can make you trip. What can I do with my stairs? Do not leave any items on the stairs. Make sure that there are handrails on both sides of the stairs and use them. Fix handrails that are broken or loose. Make sure that handrails are as long as the stairways. Check any carpeting to make sure that it is firmly attached to the stairs. Fix any carpet that is loose or worn. Avoid having  throw rugs at the top or bottom of the stairs. If you do have throw rugs, attach them to the floor with carpet tape. Make sure that you have a light switch at the top of the stairs and the bottom of the stairs. If you do not have them, ask someone to add them for you. What else can I do to help prevent falls? Wear shoes that: Do not have high heels. Have rubber bottoms. Are comfortable and fit you well. Are closed at the toe. Do not wear sandals. If you use a stepladder: Make sure that it is fully opened. Do not climb a closed stepladder. Make sure that both sides of the stepladder are locked into place. Ask someone to hold it for you, if possible. Clearly mark and make sure that you can see: Any grab bars or handrails. First and last steps. Where the edge of each step  is. Use tools that help you move around (mobility aids) if they are needed. These include: Canes. Walkers. Scooters. Crutches. Turn on the lights when you go into a dark area. Replace any light bulbs as soon as they burn out. Set up your furniture so you have a clear path. Avoid moving your furniture around. If any of your floors are uneven, fix them. If there are any pets around you, be aware of where they are. Review your medicines with your doctor. Some medicines can make you feel dizzy. This can increase your chance of falling. Ask your doctor what other things that you can do to help prevent falls. This information is not intended to replace advice given to you by your health care provider. Make sure you discuss any questions you have with your health care provider. Document Released: 01/11/2009 Document Revised: 08/23/2015 Document Reviewed: 04/21/2014 Elsevier Interactive Patient Education  2017 [employer].

## 2021-10-25 NOTE — Progress Notes (Signed)
This service is provided via telemedicine  No vital signs collected/recorded due to the encounter was a telemedicine visit.   Location of patient (ex: home, work):  Home.   Patient consents to a telephone visit:  Yes  Location of the provider (ex: office, home):  [facility]   Name of any referring provider:  Vienna Folden, [name], NP   Names of all persons participating in the telemedicine service and their role in the encounter:  Patient, Heriberto Antigua, [place], Tennant, Newell, NP.    Time spent on call: 8 minutes spent on the phone with Medical Assistant.      Subjective:   Kaitlyn Wood is a 71 y.o. female who presents for Medicare Annual (Subsequent) preventive examination.  Review of Systems     Cardiac Risk Factors include: diabetes mellitus;hypertension;dyslipidemia;obesity (BMI >30kg/m2)     Objective:    Today's Vitals   10/25/21 1125  PainSc: 1    There is no height or weight on file to calculate BMI.     10/25/2021   11:06 AM 10/07/2021   12:52 PM 09/20/2021   12:51 PM 09/07/2021    5:00 AM 12/05/2020    2:07 PM 11/23/2020   11:38 AM 10/23/2020    1:08 PM  Advanced Directives  Does Patient Have a Medical Advance Directive? Yes No Yes No Yes Yes Yes  Type of Paramedic of [place];Living will    [place];Living will [place];Living will [place];Living will  Does patient want to make changes to medical advance directive? No - Patient declined    No - Patient declined No - Patient declined No - Patient declined  Copy of [place] in Chart? Yes - validated most recent copy scanned in chart (See row information)    Yes - validated most recent copy scanned in chart (See row information) Yes - validated most recent copy scanned in chart (See row information) Yes - validated most recent copy scanned in chart (See row information)  Would patient like  information on creating a medical advance directive? No - Patient declined No - Patient declined  No - Patient declined       Current Medications (verified) Outpatient Encounter Medications as of 10/25/2021  Medication Sig   ABILIFY MAINTENA 300 MG SRER injection 300 mg every 28 (twenty-eight) days.   Accu-Chek FastClix Lancets MISC Use to test blood sugar three times daily. Dx: E11.9   ADDERALL XR 5 MG 24 hr capsule Take 5 mg by mouth daily.   Ascorbic Acid (VITAMIN C) 1000 MG tablet Take 1,000 mg by mouth 3 (three) times a week.   ASTAXANTHIN PO Take 12 mg by mouth daily as needed (high blood sugar).   Barberry-Oreg Grape-Goldenseal (BERBERINE COMPLEX PO) Take 2 capsules by mouth daily as needed (high blood sugar).   clonazePAM (KLONOPIN) 0.5 MG tablet Take 1 mg by mouth at bedtime.   Continuous Blood Gluc Sensor (FREESTYLE LIBRE 2 SENSOR) MISC USE TO TEST BLOOD SUGAR THREE TIMES DAILY   gabapentin (NEURONTIN) 300 MG capsule TAKE 1 CAPSULE BY MOUTH THREE TIMES DAILY   lamoTRIgine (LAMICTAL) 200 MG tablet Take 200 mg by mouth 2 (two) times daily. Takes Brand Name ONLY   lisinopril (ZESTRIL) 10 MG tablet TAKE 1 TABLET(10 MG) BY MOUTH DAILY   loratadine (CLARITIN) 10 MG tablet TAKE 1 TABLET(10 MG) BY MOUTH DAILY   metFORMIN (GLUCOPHAGE) 1000 MG tablet Take 1,000 mg by  mouth every morning.   ondansetron (ZOFRAN) 4 MG tablet Take 4 mg by mouth 2 (two) times daily as needed for vomiting or nausea.   sertraline (ZOLOFT) 25 MG tablet Take 25 mg by mouth at bedtime.   traMADol (ULTRAM) 50 MG tablet 1-2 tabs PO q6 hours prn pain   traZODone (DESYREL) 50 MG tablet Take 50 mg by mouth at bedtime.   [DISCONTINUED] clopidogrel (PLAVIX) 75 MG tablet Take 1 tablet (75 mg total) by mouth daily. (Patient not taking: Reported on 09/18/2021)   No facility-administered encounter medications on file as of 10/25/2021.    Allergies (verified) Aspirin, Nsaids, Oxycodone, and Statins   History: Past Medical  History:  Diagnosis Date   Anemia 02/05/2021   Anxiety    Bipolar 1 disorder (HCC)    Bulging of cervical intervertebral disc    C2   Bulging of thoracic intervertebral disc    t4   Depression    Diabetes mellitus without complication ([place])    GI bleed 02/05/2021   Hyperlipidemia    Migraine    in hospital 09/06/21--ruled out for stroke   PTSD (post-traumatic stress disorder)    Sad    Past Surgical History:  Procedure Laterality Date   CATARACT EXTRACTION Right 08/11/2017   CATARACT EXTRACTION Left 09/01/2017   EXCISION METACARPAL MASS Left 10/07/2021   Procedure: EXCISION MASS LEFT LONG FINGER;  Surgeon: Leanora Cover, MD;  Location: [place];  Service: Orthopedics;  Laterality: Left;  [place]   JOINT REPLACEMENT  2013   hip   Family History  Problem Relation Age of Onset   Ovarian cancer Mother    Lymphoma Father    Colon cancer Sister    Social History   Socioeconomic History   Marital status: Divorced    Spouse name: Not on file   Number of children: Not on file   Years of education: Not on file   Highest education level: Not on file  Occupational History   Not on file  Tobacco Use   Smoking status: Never   Smokeless tobacco: Never  Vaping Use   Vaping Use: Never used  Substance and Sexual Activity   Alcohol use: No   Drug use: No   Sexual activity: Not Currently  Other Topics Concern   Not on file  Social History Narrative   Diet? ? Fresh food- very little prepared food- no fast food.       Do you drink/eat things with caffeine?  Yes      Marital status?         Divorced                           What year were you married? 1978      Do you live in a house, apartment, assisted living, condo, trailer, etc.? apartment      Is it one or more stories? Yes      How many persons live in your home? 1      Do you have any pets in your home? (please list)  1 cat, Polly      Current or past  profession: Air traffic controller, sales       Do you exercise?       yes  Type & how often? 2-3 times a week      Do you have a living will? No      Do you have a DNR form?    no                              If not, do you want to discuss one? yes      Do you have signed POA/HPOA for forms? no   Social Determinants of Health   Financial Resource Strain: Low Risk  (09/30/2017)   Overall Financial Resource Strain (CARDIA)    Difficulty of Paying Living Expenses: Not hard at all  Food Insecurity: No Food Insecurity (09/30/2017)   Hunger Vital Sign    Worried About Running Out of Food in the Last Year: Never true    [place] in the Last Year: Never true  Transportation Needs: No Transportation Needs (09/30/2017)   PRAPARE - Hydrologist (Medical): No    Lack of Transportation (Non-Medical): No  Physical Activity: Inactive (09/30/2017)   Exercise Vital Sign    Days of Exercise per Week: 0 days    Minutes of Exercise per Session: 0 min  Stress: Stress Concern Present (09/30/2017)   [place]    Feeling of Stress : Rather much  Social Connections: Socially Isolated (09/30/2017)   Social Connection and Isolation Panel [NHANES]    Frequency of Communication with Friends and Family: Once a week    Frequency of Social Gatherings with Friends and Family: Once a week    Attends Religious Services: Never    Marine scientist or Organizations: No    Attends Music therapist: Never    Marital Status: Divorced    Tobacco Counseling Counseling given: Not Answered   Clinical Intake:  Pre-visit preparation completed: Yes  Pain Score: 1  Pain Type: Other (Comment) (s/p surgery) Pain Location: Hand Pain Orientation: Left Pain Radiating Towards: No Pain Descriptors / Indicators: Aching Pain Onset: Other (comment) (one month) Pain Frequency:  Intermittent Pain Relieving Factors: tylenol and tramadol Effect of Pain on Daily Activities: No  Pain Relieving Factors: tylenol and tramadol  BMI - recorded: 32.95 Nutritional Status: BMI > 30  Obese Nutritional Risks: None Diabetes: Yes CBG done?: No (reports CBG 100 - 140's) Did pt. bring in CBG monitor from home?: No (reports on the phone)  How often do you need to have someone help you when you read instructions, pamphlets, or other written materials from your doctor or pharmacy?: 1 - Never What is the last grade level you completed in school?: Graduate school  Diabetic?Yes   Interpreter Needed?: No      Activities of Daily Living    10/25/2021   11:35 AM 10/07/2021   12:49 PM  In your present state of health, do you have any difficulty performing the following activities:  Hearing? 1 0  Comment hearing aids   Vision? 0 0  Difficulty concentrating or making decisions? 0 0  Walking or climbing stairs? 1 0  Dressing or bathing? 0 0  Doing errands, shopping? 0   Preparing Food and eating ? N   Using the Toilet? N   In the past six months, have you accidently leaked urine? Y   Do you have problems with loss of bowel control? N   Managing your Medications? N  Managing your Finances? N   Housekeeping or managing your Housekeeping? N     Patient Care Team: Judd Mccubbin, [name], NP as PCP - General (Family Medicine) Brien Few, MD as Consulting Physician (Obstetrics and Gynecology) Katy Apo, MD as Consulting Physician (Ophthalmology)  Indicate any recent Medical Services you may have received from other than [facility] providers in the past year (date may be approximate).     Assessment:   This is a routine wellness examination for Kaitlyn Wood.  Hearing/Vision screen Hearing Screening - Comments:: No Hearing Concerns. Patient just received hearing aids for both ears. Vision Screening - Comments:: No Vision Concerns. Patient doesn't wear prescription glasses only  reading glasses. Patient last eye exam February 2023.  Dietary issues and exercise activities discussed: Current Exercise Habits: The patient does not participate in regular exercise at present, Exercise limited by: Other - see comments (pain on finger)   Goals Addressed             This Visit's Progress    Patient Stated       Would like to keep blood sugars under controlled        Depression Screen    10/25/2021   11:02 AM 10/23/2020    1:07 PM 10/18/2019   10:27 AM 04/25/2019    1:10 PM 10/06/2018    1:16 PM 02/18/2018    9:52 AM 09/30/2017    9:52 AM  PHQ 2/9 Scores  PHQ - 2 Score 0 0 0 0  0 6  PHQ- 9 Score       15  Exception Documentation  Other- indicate reason in comment box   Other- indicate reason in comment box    Not completed     Under the care of specialist      Fall Risk    10/25/2021   11:02 AM 09/18/2021   10:12 AM 03/27/2021   10:08 AM 12/04/2020    1:59 PM 11/23/2020   11:38 AM  Fall Risk   Falls in the past year? '1 1 1 1 1  '$ Number falls in past yr: 1 0 0 1 1  Injury with Fall? 0 0 0 0 0  Risk for fall due to : Impaired balance/gait;Impaired mobility;History of fall(s) History of fall(s) History of fall(s) History of fall(s) History of fall(s)  Follow up Falls evaluation completed;Education provided;Falls prevention discussed Falls evaluation completed Falls evaluation completed;Education provided;Falls prevention discussed Falls evaluation completed Falls evaluation completed    FALL RISK PREVENTION PERTAINING TO THE HOME:  Any stairs in or around the home? No  If so, are there any without handrails? No  Home free of loose throw rugs in walkways, pet beds, electrical cords, etc? No  Adequate lighting in your home to reduce risk of falls? Yes   ASSISTIVE DEVICES UTILIZED TO PREVENT FALLS:  Life alert? No  Use of a cane, walker or w/c? Yes  Grab bars in the bathroom? Yes  Shower chair or bench in shower? Yes  Elevated toilet seat or a handicapped  toilet? No   TIMED UP AND GO:  Was the test performed? No .  Length of time to ambulate 10 feet: N/A  sec.   Gait slow and steady without use of assistive device  Cognitive Function:    10/06/2018    1:17 PM 09/30/2017   10:04 AM 04/04/2016    3:08 PM  MMSE - Mini Mental State Exam  Orientation to time '5 5 5  '$ Orientation to Place 5 5  5  Registration '3 3 3  '$ Attention/ Calculation '5 4 5  '$ Recall '1 2 2  '$ Language- name 2 objects '2 2 2  '$ Language- repeat '1 1 1  '$ Language- follow 3 step command '3 3 3  '$ Language- read & follow direction '1 1 1  '$ Write a sentence '1 1 1  '$ Copy design 1 0 1  Total score '28 27 29        '$ 10/25/2021   11:04 AM 10/23/2020    1:09 PM 10/18/2019   10:28 AM  6CIT Screen  What Year? 0 points 0 points 0 points  What month? 0 points 0 points 0 points  What time? 0 points 0 points 0 points  Count back from 20 0 points 0 points 0 points  Months in reverse 0 points 0 points 2 points  Repeat phrase 0 points 0 points 2 points  Total Score 0 points 0 points 4 points    Immunizations Immunization History  Administered Date(s) Administered   Fluad Quad(high Dose 65+) 12/21/2018, 12/02/2019, 11/23/2020   Influenza,inj,Quad PF,6+ Mos 04/04/2016   Influenza-Unspecified 12/29/2013, 12/14/2017   PFIZER(Purple Top)SARS-COV-2 Vaccination 08/18/2019, 09/12/2019   Pneumococcal Conjugate-13 01/04/2016   Pneumococcal Polysaccharide-23 09/30/2017   Tdap 09/29/2015   Zoster Recombinat (Shingrix) 12/05/2017, 06/03/2018    TDAP status: Up to date  Flu Vaccine status: Up to date  Pneumococcal vaccine status: Up to date  Covid-19 vaccine status: Declined, Education has been provided regarding the importance of this vaccine but patient still declined. Advised may receive this vaccine at local pharmacy or Health Dept.or vaccine clinic. Aware to provide a copy of the vaccination record if obtained from local pharmacy or Health Dept. Verbalized acceptance and  understanding.  Qualifies for Shingles Vaccine? Yes   Zostavax completed Yes   Shingrix Completed?: Yes  Screening Tests Health Maintenance  Topic Date Due   COVID-19 Vaccine (3 - Pfizer risk series) 10/10/2019   FOOT EXAM  05/28/2021   INFLUENZA VACCINE  10/29/2021   MAMMOGRAM  11/06/2021   HEMOGLOBIN A1C  03/09/2022   OPHTHALMOLOGY EXAM  04/08/2022   TETANUS/TDAP  09/28/2025   COLONOSCOPY (Pts 45-34yr Insurance coverage will need to be confirmed)  02/08/2031   Pneumonia Vaccine 72 Years old  Completed   DEXA SCAN  Completed   Hepatitis C Screening  Completed   Zoster Vaccines- Shingrix  Completed   HPV VACCINES  Aged Out    Health Maintenance  Health Maintenance Due  Topic Date Due   COVID-19 Vaccine (3 - Pfizer risk series) 10/10/2019   FOOT EXAM  05/28/2021    Colorectal cancer screening: Type of screening: Colonoscopy. Completed 02/07/2021. Repeat every 10 years  Mammogram status: Completed 11/07/2019. Repeat every year  Bone Density status: Completed 06/25/2017. Results reflect: Bone density results: OSTEOPENIA. Repeat every 5 years.  Lung Cancer Screening: (Low Dose CT Chest recommended if Age 72-80years, 30 pack-year currently smoking OR have quit w/in 15years.) does not qualify.   Lung Cancer Screening Referral: No   Additional Screening:  Hepatitis C Screening: does qualify; Completed Yes   Vision Screening: Recommended annual ophthalmology exams for early detection of glaucoma and other disorders of the eye. Is the patient up to date with their annual eye exam?  Yes  Who is the provider or what is the name of the office in which the patient attends annual eye exams? Dr.Lyles  If pt is not established with a provider, would they like to be referred to a provider to establish  care? No .   Dental Screening: Recommended annual dental exams for proper oral hygiene  Community Resource Referral / Chronic Care Management: CRR required this visit?  No   CCM  required this visit?  No      Plan:     I have personally reviewed and noted the following in the patient's chart:   Medical and social history Use of alcohol, tobacco or illicit drugs  Current medications and supplements including opioid prescriptions.  Functional ability and status Nutritional status Physical activity Advanced directives List of other physicians Hospitalizations, surgeries, and ER visits in previous 12 months Vitals Screenings to include cognitive, depression, and falls Referrals and appointments  In addition, I have reviewed and discussed with patient certain preventive protocols, quality metrics, and best practice recommendations. A written personalized care plan for preventive services as well as general preventive health recommendations were provided to patient.     Sandrea Hughs, NP   10/25/2021   Nurse Notes: Immunization up to date except COVID-19 booster vaccine but declines.

## 2021-10-28 ENCOUNTER — Other Ambulatory Visit: Payer: Self-pay | Admitting: *Deleted

## 2021-10-28 DIAGNOSIS — L602 Onychogryphosis: Secondary | ICD-10-CM | POA: Diagnosis not present

## 2021-10-28 DIAGNOSIS — E1159 Type 2 diabetes mellitus with other circulatory complications: Secondary | ICD-10-CM

## 2021-10-28 DIAGNOSIS — L84 Corns and callosities: Secondary | ICD-10-CM | POA: Diagnosis not present

## 2021-10-28 DIAGNOSIS — E1351 Other specified diabetes mellitus with diabetic peripheral angiopathy without gangrene: Secondary | ICD-10-CM | POA: Diagnosis not present

## 2021-10-28 MED ORDER — METFORMIN HCL 500 MG PO TABS: 500.0000 mg | ORAL_TABLET | Freq: Two times a day (BID) | ORAL | 0 refills | Status: DC

## 2021-10-28 NOTE — Telephone Encounter (Signed)
[facility] called and stated that patient is leaving to go out of [place] on Thursday and Kaitlyn Wood not receive the shipment they shipped for her before she leaves.   Stated that they have placed an override to send a Rx to local pharmacy for a 90 day supply so patient has her medication before she leave.   Rx sent to pharmacy as requested.

## 2021-11-10 ENCOUNTER — Other Ambulatory Visit: Payer: Self-pay | Admitting: Family

## 2021-11-10 DIAGNOSIS — G894 Chronic pain syndrome: Secondary | ICD-10-CM

## 2021-11-13 ENCOUNTER — Telehealth: Payer: Self-pay

## 2021-11-13 NOTE — Telephone Encounter (Signed)
Patient called and stated that she neds order for abilify injection sent in to either [facility] in [place]. Patient states that she will be [place] for aobut a month or two.  Message routed to Marlowe Sax, NP

## 2021-11-14 NOTE — Telephone Encounter (Signed)
Refill patient's medication as requested.

## 2021-11-14 NOTE — Telephone Encounter (Signed)
Spoke with patient  again and she states that she needs referral for nursing to give her injection and not refill of medication.

## 2021-11-14 NOTE — Telephone Encounter (Signed)
Provider is not incensed outside of [place] so cannot order [facility] or treat.

## 2021-11-14 NOTE — Telephone Encounter (Signed)
Spoke with patient and informed her that provider unable to order [facility] because provider not licensed outside of [place]. Patient has been advise of this several times. Patient still requested that I ask Dr. Alain Honey to write order. I again informed patient that Dr. Lillette Boxer.Sabra Heck is not licensed outside of [place].Patient also states that Dr. Lillette Boxer. Sabra Heck is her PCP  Message routed to Dr. Lillette Boxer. Sabra Heck

## 2021-11-18 DIAGNOSIS — T24232A Burn of second degree of left lower leg, initial encounter: Secondary | ICD-10-CM | POA: Diagnosis not present

## 2021-11-18 DIAGNOSIS — E119 Type 2 diabetes mellitus without complications: Secondary | ICD-10-CM | POA: Diagnosis not present

## 2021-11-18 DIAGNOSIS — T24211A Burn of second degree of right thigh, initial encounter: Secondary | ICD-10-CM | POA: Diagnosis not present

## 2021-11-18 DIAGNOSIS — T25232A Burn of second degree of left toe(s) (nail), initial encounter: Secondary | ICD-10-CM | POA: Diagnosis not present

## 2021-11-18 DIAGNOSIS — R34 Anuria and oliguria: Secondary | ICD-10-CM | POA: Diagnosis not present

## 2021-11-18 DIAGNOSIS — T24212A Burn of second degree of left thigh, initial encounter: Secondary | ICD-10-CM | POA: Diagnosis not present

## 2021-11-18 DIAGNOSIS — X12XXXA Contact with other hot fluids, initial encounter: Secondary | ICD-10-CM | POA: Diagnosis not present

## 2021-11-18 DIAGNOSIS — R109 Unspecified abdominal pain: Secondary | ICD-10-CM | POA: Diagnosis not present

## 2021-11-18 DIAGNOSIS — Z9181 History of falling: Secondary | ICD-10-CM | POA: Diagnosis not present

## 2021-11-18 DIAGNOSIS — T24231A Burn of second degree of right lower leg, initial encounter: Secondary | ICD-10-CM | POA: Diagnosis not present

## 2021-11-18 DIAGNOSIS — S8001XA Contusion of right knee, initial encounter: Secondary | ICD-10-CM | POA: Diagnosis not present

## 2021-11-21 DIAGNOSIS — T24211D Burn of second degree of right thigh, subsequent encounter: Secondary | ICD-10-CM | POA: Diagnosis not present

## 2021-11-21 DIAGNOSIS — T24232D Burn of second degree of left lower leg, subsequent encounter: Secondary | ICD-10-CM | POA: Diagnosis not present

## 2021-11-21 DIAGNOSIS — T24212D Burn of second degree of left thigh, subsequent encounter: Secondary | ICD-10-CM | POA: Diagnosis not present

## 2021-11-21 DIAGNOSIS — T24231D Burn of second degree of right lower leg, subsequent encounter: Secondary | ICD-10-CM | POA: Diagnosis not present

## 2021-12-04 DIAGNOSIS — T24232D Burn of second degree of left lower leg, subsequent encounter: Secondary | ICD-10-CM | POA: Diagnosis not present

## 2021-12-04 DIAGNOSIS — T24212D Burn of second degree of left thigh, subsequent encounter: Secondary | ICD-10-CM | POA: Diagnosis not present

## 2021-12-04 DIAGNOSIS — T24211D Burn of second degree of right thigh, subsequent encounter: Secondary | ICD-10-CM | POA: Diagnosis not present

## 2021-12-09 DIAGNOSIS — F3181 Bipolar II disorder: Secondary | ICD-10-CM | POA: Diagnosis not present

## 2021-12-16 ENCOUNTER — Inpatient Hospital Stay: Payer: Medicare HMO | Admitting: Neurology

## 2021-12-25 DIAGNOSIS — T24232D Burn of second degree of left lower leg, subsequent encounter: Secondary | ICD-10-CM | POA: Diagnosis not present

## 2021-12-25 DIAGNOSIS — T24212D Burn of second degree of left thigh, subsequent encounter: Secondary | ICD-10-CM | POA: Diagnosis not present

## 2021-12-25 DIAGNOSIS — T24211D Burn of second degree of right thigh, subsequent encounter: Secondary | ICD-10-CM | POA: Diagnosis not present

## 2021-12-30 ENCOUNTER — Inpatient Hospital Stay: Payer: Medicare HMO | Admitting: Neurology

## 2022-01-22 ENCOUNTER — Ambulatory Visit: Payer: Medicare HMO | Admitting: Family Medicine

## 2022-01-31 ENCOUNTER — Other Ambulatory Visit: Payer: Self-pay

## 2022-01-31 DIAGNOSIS — F909 Attention-deficit hyperactivity disorder, unspecified type: Secondary | ICD-10-CM | POA: Diagnosis not present

## 2022-01-31 DIAGNOSIS — E1159 Type 2 diabetes mellitus with other circulatory complications: Secondary | ICD-10-CM

## 2022-01-31 DIAGNOSIS — F4310 Post-traumatic stress disorder, unspecified: Secondary | ICD-10-CM | POA: Diagnosis not present

## 2022-01-31 DIAGNOSIS — Z5181 Encounter for therapeutic drug level monitoring: Secondary | ICD-10-CM | POA: Diagnosis not present

## 2022-01-31 DIAGNOSIS — F319 Bipolar disorder, unspecified: Secondary | ICD-10-CM | POA: Diagnosis not present

## 2022-01-31 DIAGNOSIS — F329 Major depressive disorder, single episode, unspecified: Secondary | ICD-10-CM | POA: Diagnosis not present

## 2022-01-31 MED ORDER — METFORMIN HCL 500 MG PO TABS
500.0000 mg | ORAL_TABLET | Freq: Two times a day (BID) | ORAL | 0 refills | Status: DC
Start: 2022-01-31 — End: 2022-07-04

## 2022-01-31 NOTE — Telephone Encounter (Signed)
Patient called requesting refill on Metformin. She is out of town in [place] and would like to have prescription sent to [facility] there.  Medication has been pended with requested pharmacy and sen to [place] Ngetich,NP for approval if deemed appropriate.

## 2022-02-08 ENCOUNTER — Other Ambulatory Visit: Payer: Self-pay | Admitting: Family

## 2022-02-08 DIAGNOSIS — J302 Other seasonal allergic rhinitis: Secondary | ICD-10-CM

## 2022-02-08 DIAGNOSIS — I10 Essential (primary) hypertension: Secondary | ICD-10-CM

## 2022-02-08 DIAGNOSIS — E1169 Type 2 diabetes mellitus with other specified complication: Secondary | ICD-10-CM

## 2022-02-12 ENCOUNTER — Inpatient Hospital Stay: Payer: Medicare HMO | Admitting: Neurology

## 2022-02-13 DIAGNOSIS — F909 Attention-deficit hyperactivity disorder, unspecified type: Secondary | ICD-10-CM | POA: Diagnosis not present

## 2022-02-13 DIAGNOSIS — F4310 Post-traumatic stress disorder, unspecified: Secondary | ICD-10-CM | POA: Diagnosis not present

## 2022-03-03 DIAGNOSIS — F900 Attention-deficit hyperactivity disorder, predominantly inattentive type: Secondary | ICD-10-CM | POA: Diagnosis not present

## 2022-03-03 DIAGNOSIS — F3181 Bipolar II disorder: Secondary | ICD-10-CM | POA: Diagnosis not present

## 2022-03-03 DIAGNOSIS — F419 Anxiety disorder, unspecified: Secondary | ICD-10-CM | POA: Diagnosis not present

## 2022-03-04 DIAGNOSIS — F909 Attention-deficit hyperactivity disorder, unspecified type: Secondary | ICD-10-CM | POA: Diagnosis not present

## 2022-03-04 DIAGNOSIS — F4310 Post-traumatic stress disorder, unspecified: Secondary | ICD-10-CM | POA: Diagnosis not present

## 2022-03-04 DIAGNOSIS — F319 Bipolar disorder, unspecified: Secondary | ICD-10-CM | POA: Diagnosis not present

## 2022-03-13 ENCOUNTER — Telehealth: Payer: Self-pay

## 2022-03-13 DIAGNOSIS — F310 Bipolar disorder, current episode hypomanic: Secondary | ICD-10-CM

## 2022-03-13 NOTE — Telephone Encounter (Signed)
Message left on clinical intake voicemail:   Patient has returned from [place] and would like for the [facility] nurse to resume giving her monthly injections of Ability. As [name] remembers the nurse was Awanda Mink. Terre indicated we can contact [facility] at [phone]  I called patient to confirm that we are the ordering provider for the Abilify as that is usually filled or initiated by a specialist. Patient states her psychiatrist originally order, however per [facility] the primacy care physician has to give the order for home health to administer monthly.   I called [facility] and left a detailed message asking if a verbal order is needed or if a home health referral is needed in order for patient to resume getting monthly Abilify injections.  Message forwarded to Ngetich, [name], NP as a FYI while await return call from [facility].

## 2022-03-13 NOTE — Telephone Encounter (Signed)
Home health Nurse to administer Abilify injection 300 mg every 28 days

## 2022-03-13 NOTE — Telephone Encounter (Signed)
Order was routed/faxed via [employer]

## 2022-03-13 NOTE — Telephone Encounter (Signed)
Vickie stated Ngetich, Dinah C, NP needs to put in an order for patient to resume Abilify injections and they will be able to pull it from Epic.  Vickie states that the order can be faxed to [phone] or [phone]

## 2022-04-02 ENCOUNTER — Ambulatory Visit: Payer: Medicare HMO | Admitting: Family Medicine

## 2022-04-08 ENCOUNTER — Encounter: Payer: Self-pay | Admitting: Pharmacist

## 2022-04-08 ENCOUNTER — Telehealth: Payer: Self-pay | Admitting: *Deleted

## 2022-04-08 NOTE — Progress Notes (Signed)
[facility])                                            [place] Team                                        Statin Quality Measure Assessment    04/08/2022  Kaitlyn Wood 01/09/1951 [id number]  Per review of chart and payor information, patient has a diagnosis of diabetes but is not currently filling a statin prescription.  This places patient into the SUPD (Statin Use In Patients with Diabetes) measure for CMS.    Patient has documented allergy to statin but no corresponding CPT codes that would exclude patient from SUPD measure.  She has an upcoming PCP appointment on 04/09/2022.  The 10-year ASCVD risk score (Arnett DK, et al., 2019) is: 35.6%   Values used to calculate the score:     Age: 72 years     Sex: Female     Is Non-Hispanic African American: No     Diabetic: Yes     Tobacco smoker: No     Systolic Blood Pressure: 563 mmHg     Is BP treated: Yes     HDL Cholesterol: 49 mg/dL     Total Cholesterol: 235 mg/dL 09/07/2021     Component Value Date/Time   CHOL 235 (H) 09/07/2021 0123   TRIG 145 09/07/2021 0123   HDL 49 09/07/2021 0123   CHOLHDL 4.8 09/07/2021 0123   VLDL 29 09/07/2021 0123   LDLCALC 157 (H) 09/07/2021 0123   LDLCALC 200 (H) 11/21/2020 1417    Please consider ONE of the following recommendations:  Initiate high intensity statin Atorvastatin '40mg'$  once daily, #90, 3 refills   Rosuvastatin '20mg'$  once daily, #90, 3 refills    Initiate moderate intensity          statin with reduced frequency if prior          statin intolerance 1x weekly, #13, 3 refills   2x weekly, #26, 3 refills   3x weekly, #39, 3 refills    Code for past statin intolerance or  other exclusions (required annually)   Provider Requirements:  Associate code during an office visit or telehealth encounter  Drug Induced Myopathy G72.0   Myopathy, unspecified G72.9   Myositis, unspecified M60.9   Rhabdomyolysis J49.70    Alcoholic fatty liver Y63.7   Cirrhosis of liver K74.69   Prediabetes R73.03   PCOS E28.2   Toxic liver disease, unspecified K71.9         Plan: Route note to PCP prior to upcoming appointment.  Elayne Guerin, PharmD, [place] Clinical Pharmacist [phone]

## 2022-04-08 NOTE — Telephone Encounter (Signed)
Ngetich, [name], NP  Idamae Schuller, Lillette Boxer, MD; Jeri Lager, [place] Phone Number: [phone]   Deetta Perla, Patient has upcoming visit with DR.Miller on 04/09/2021 will have F L 2 form completed then Rodena Piety will send recent note and F L 2 form to [facility]. Thank You Dinah Ngetich,FNP-C       Previous Messages    ----- Message ----- From: Reinaldo Raddle Sent: 04/07/2022   4:05 PM EST To: Tula Nakayama; Sandrea Hughs, NP Subject: [facility]                                      Hello I'm Calvin with [facility], we received the order for Mrs. Cyphers but we also need the F2F/Progress Note 90days prior or 30days after.

## 2022-04-09 ENCOUNTER — Encounter: Payer: Self-pay | Admitting: Family Medicine

## 2022-04-09 ENCOUNTER — Ambulatory Visit (INDEPENDENT_AMBULATORY_CARE_PROVIDER_SITE_OTHER): Payer: Medicare HMO | Admitting: Family Medicine

## 2022-04-09 VITALS — BP 154/102 | HR 100 | Temp 96.8°F | Ht 63.0 in | Wt 185.0 lb

## 2022-04-09 DIAGNOSIS — I10 Essential (primary) hypertension: Secondary | ICD-10-CM | POA: Diagnosis not present

## 2022-04-09 DIAGNOSIS — F310 Bipolar disorder, current episode hypomanic: Secondary | ICD-10-CM | POA: Diagnosis not present

## 2022-04-09 DIAGNOSIS — E1159 Type 2 diabetes mellitus with other circulatory complications: Secondary | ICD-10-CM | POA: Diagnosis not present

## 2022-04-09 DIAGNOSIS — G729 Myopathy, unspecified: Secondary | ICD-10-CM

## 2022-04-09 DIAGNOSIS — Z23 Encounter for immunization: Secondary | ICD-10-CM

## 2022-04-09 NOTE — Progress Notes (Signed)
Provider:  Alain Honey, MD  Careteam: Patient Care Team: Ngetich, [name], NP as PCP - General (Family Medicine) Brien Few, MD as Consulting Physician (Obstetrics and Gynecology) Katy Apo, MD as Consulting Physician (Ophthalmology)  PLACE OF SERVICE:  [place]  Advanced Directive information    Allergies  Allergen Reactions   Aspirin Other (See Comments)    History of GI bleed   Nsaids     History of GI bleed   Oxycodone     Migraines   Statins Other (See Comments)    "makes my urine turn brown" Other reaction(s): MUSCLE PAIN "makes my urine turn brown"    Chief Complaint  Patient presents with   Medical Management of Chronic Issues    Patient presents today for follow-up appointment     HPI: Patient is a 72 y.o. female .  Patient is here for medical management of chronic problems including bipolar 1 disorder, diabetes, hyperlipidemia.  Her bipolar symptoms are managed by psychiatry and she is on a combination of Adderall, Abilify, Klonopin, Lamictal,.  She also takes sertraline and trazodone apparently more for sleep since doses are not therapeutic for depression.  She takes some herbal concoctions for her diabetes but also takes metformin 500 mg twice daily her last A1c in June was 6.3 She has a history of hyperlipidemia but has been intolerant of statins as they make her urine turned brown.  She also has muscle pain.  We spent some time talking about her cholesterol I suggested we could try Zetia but she declined that. She has been doing fairly well as regards bipolar.  Again stressed the need to continue medicine even though she feels well. Review of Systems:  Review of Systems  Constitutional: Negative.   HENT: Negative.    Eyes: Negative.   Respiratory: Negative.    Cardiovascular: Negative.   Gastrointestinal: Negative.   Musculoskeletal: Negative.   Neurological: Negative.   Psychiatric/Behavioral: Negative.      Past Medical History:   Diagnosis Date   Anemia 02/05/2021   Anxiety    Bipolar 1 disorder ([place])    Bulging of cervical intervertebral disc    C2   Bulging of thoracic intervertebral disc    t4   Depression    Diabetes mellitus without complication ([place])    GI bleed 02/05/2021   Hyperlipidemia    Migraine    in hospital 09/06/21--ruled out for stroke   PTSD (post-traumatic stress disorder)    Sad    Past Surgical History:  Procedure Laterality Date   CATARACT EXTRACTION Right 08/11/2017   CATARACT EXTRACTION Left 09/01/2017   EXCISION METACARPAL MASS Left 10/07/2021   Procedure: EXCISION MASS LEFT LONG FINGER;  Surgeon: Leanora Cover, MD;  Location: [facility];  Service: Orthopedics;  Laterality: Left;  [place]   JOINT REPLACEMENT  2013   hip   Social History:   reports that she has never smoked. She has never used smokeless tobacco. She reports that she does not drink alcohol and does not use drugs.  Family History  Problem Relation Age of Onset   Ovarian cancer Mother    Lymphoma Father    Colon cancer Sister     Medications: Patient's Medications  New Prescriptions   No medications on file  Previous Medications   ABILIFY MAINTENA 300 MG SRER INJECTION    300 mg every 28 (twenty-eight) days.   ACCU-CHEK FASTCLIX  LANCETS MISC    Use to test blood sugar three times daily. Dx: E11.9   ADDERALL XR 5 MG 24 HR CAPSULE    Take 5 mg by mouth daily.   ASCORBIC ACID (VITAMIN C) 1000 MG TABLET    Take 1,000 mg by mouth 3 (three) times a week.   ASTAXANTHIN PO    Take 12 mg by mouth daily as needed (high blood sugar).   BARBERRY-OREG GRAPE-GOLDENSEAL (BERBERINE COMPLEX PO)    Take 2 capsules by mouth daily as needed (high blood sugar).   BLOOD GLUCOSE METER KIT AND SUPPLIES    Dispense based on patient and insurance preference. Use up to three times daily. (FOR ICD-10 E10.9, E11.9).   CLONAZEPAM (KLONOPIN) 0.5 MG TABLET    Take 1 mg by mouth at  bedtime.   CONTINUOUS BLOOD GLUC SENSOR (FREESTYLE LIBRE 2 SENSOR) MISC    USE TO TEST BLOOD SUGAR THREE TIMES DAILY   GABAPENTIN (NEURONTIN) 300 MG CAPSULE    TAKE 1 CAPSULE BY MOUTH THREE TIMES DAILY   LAMOTRIGINE (LAMICTAL) 200 MG TABLET    Take 200 mg by mouth 2 (two) times daily. Takes Brand Name ONLY   LISINOPRIL (ZESTRIL) 10 MG TABLET    TAKE 1 TABLET(10 MG) BY MOUTH DAILY   LORATADINE (CLARITIN) 10 MG TABLET    TAKE 1 TABLET(10 MG) BY MOUTH DAILY   METFORMIN (GLUCOPHAGE) 500 MG TABLET    Take 1 tablet (500 mg total) by mouth 2 (two) times daily with a meal.   ONDANSETRON (ZOFRAN) 4 MG TABLET    Take 4 mg by mouth 2 (two) times daily as needed for vomiting or nausea.   SERTRALINE (ZOLOFT) 25 MG TABLET    Take 25 mg by mouth at bedtime.   TRAMADOL (ULTRAM) 50 MG TABLET    1-2 tabs PO q6 hours prn pain   TRAZODONE (DESYREL) 50 MG TABLET    Take 25 mg by mouth at bedtime.  Modified Medications   No medications on file  Discontinued Medications   No medications on file    Physical Exam:  Vitals:   04/09/22 1103  BP: (!) 154/102  Pulse: 100  Temp: (!) 96.8 F (36 C)  SpO2: 97%  Weight: 185 lb (83.9 kg)  Height: '5\' 3"'$  (1.6 m)   Body mass index is 32.77 kg/m. Wt Readings from Last 3 Encounters:  04/09/22 185 lb (83.9 kg)  10/07/21 186 lb 11.7 oz (84.7 kg)  09/18/21 186 lb (84.4 kg)    Physical Exam Vitals and nursing note reviewed.  Constitutional:      Appearance: Normal appearance.  Cardiovascular:     Rate and Rhythm: Normal rate and regular rhythm.  Pulmonary:     Effort: Pulmonary effort is normal.     Breath sounds: Normal breath sounds.  Musculoskeletal:        General: Normal range of motion.  Skin:    General: Skin is warm.  Neurological:     General: No focal deficit present.     Mental Status: She is alert and oriented to person, place, and time.     Labs reviewed: Basic Metabolic Panel: Recent Labs    09/06/21 1207 09/06/21 2003  NA 136  --    K 3.7  --   CL 108  --   CO2 22  --   GLUCOSE 106*  --   BUN 21  --   CREATININE 0.91  --   CALCIUM 7.6*  --  MG  --  2.4  TSH 0.810  --    Liver Function Tests: Recent Labs    09/06/21 1207  AST 14*  ALT 12  ALKPHOS 56  BILITOT 0.6  PROT 5.7*  ALBUMIN 3.6   No results for input(s): "LIPASE", "AMYLASE" in the last 8760 hours. No results for input(s): "AMMONIA" in the last 8760 hours. CBC: Recent Labs    09/06/21 1207  WBC 4.1  NEUTROABS 3.1  HGB 12.3  HCT 37.0  MCV 89.2  PLT 186   Lipid Panel: Recent Labs    09/07/21 0123  CHOL 235*  HDL 49  LDLCALC 157*  TRIG 145  CHOLHDL 4.8   TSH: Recent Labs    09/06/21 1207  TSH 0.810   A1C: Lab Results  Component Value Date   HGBA1C 6.3 (H) 09/07/2021     Assessment/Plan  1. Need for influenza vaccination Flu shot given  2. Bipolar affective disorder, current episode hypomanic ([place]) Continue current regimen.  I believe the main reason for her visit today was to get her injection of Abilify which she brought with her.  I was able to do that with her coaching and directions on the package insert.  He needs a note telling her home health nurse to do the same as she appears stable as regards bipolar disease  3. Type 2 diabetes mellitus with other circulatory complication, without long-term current use of insulin ([place]) To continue herbal medicines as well as Glucophage.  Will check A1c today  4. Primary hypertension Blood pressure is elevated at 154/102 and she has been getting high readings at home.  Will increase lisinopril from 10 mg to 20 mg.  When she exhaust current supply of lisinopril we will switch her to irbesartan  5. Myopathy, unspecified Patient does have hyper lipidemia.  She is intolerant of statins.  Did discuss as of 10 May but she declined to begin new medicine   Alain Honey, MD [place] [phone]

## 2022-04-09 NOTE — Telephone Encounter (Signed)
Patient was in office to see Dr.Miller today. Per Dr.Miller patient needs a letter stating she was seen today and is in compliance with her medications.  I had an in-depth conversation with the patient and she expressed that we need to send the progress note from today to [facility]. Patient was informed once Dr.Miller signs his note from today and notifies a clinical team member we will submit today's office visit note, patient verbalized understanding.   Fax number for [facility] is [phone]

## 2022-04-16 DIAGNOSIS — G729 Myopathy, unspecified: Secondary | ICD-10-CM | POA: Diagnosis not present

## 2022-04-16 DIAGNOSIS — E785 Hyperlipidemia, unspecified: Secondary | ICD-10-CM | POA: Diagnosis not present

## 2022-04-16 DIAGNOSIS — M5020 Other cervical disc displacement, unspecified cervical region: Secondary | ICD-10-CM | POA: Diagnosis not present

## 2022-04-16 DIAGNOSIS — F419 Anxiety disorder, unspecified: Secondary | ICD-10-CM | POA: Diagnosis not present

## 2022-04-16 DIAGNOSIS — E1159 Type 2 diabetes mellitus with other circulatory complications: Secondary | ICD-10-CM | POA: Diagnosis not present

## 2022-04-16 DIAGNOSIS — I10 Essential (primary) hypertension: Secondary | ICD-10-CM | POA: Diagnosis not present

## 2022-04-16 DIAGNOSIS — F310 Bipolar disorder, current episode hypomanic: Secondary | ICD-10-CM | POA: Diagnosis not present

## 2022-04-16 DIAGNOSIS — F4310 Post-traumatic stress disorder, unspecified: Secondary | ICD-10-CM | POA: Diagnosis not present

## 2022-04-16 DIAGNOSIS — G43909 Migraine, unspecified, not intractable, without status migrainosus: Secondary | ICD-10-CM | POA: Diagnosis not present

## 2022-04-29 DIAGNOSIS — F900 Attention-deficit hyperactivity disorder, predominantly inattentive type: Secondary | ICD-10-CM | POA: Diagnosis not present

## 2022-04-29 DIAGNOSIS — F419 Anxiety disorder, unspecified: Secondary | ICD-10-CM | POA: Diagnosis not present

## 2022-04-29 DIAGNOSIS — F3181 Bipolar II disorder: Secondary | ICD-10-CM | POA: Diagnosis not present

## 2022-05-08 DIAGNOSIS — G43909 Migraine, unspecified, not intractable, without status migrainosus: Secondary | ICD-10-CM | POA: Diagnosis not present

## 2022-05-08 DIAGNOSIS — F310 Bipolar disorder, current episode hypomanic: Secondary | ICD-10-CM | POA: Diagnosis not present

## 2022-05-08 DIAGNOSIS — E785 Hyperlipidemia, unspecified: Secondary | ICD-10-CM | POA: Diagnosis not present

## 2022-05-08 DIAGNOSIS — F4310 Post-traumatic stress disorder, unspecified: Secondary | ICD-10-CM | POA: Diagnosis not present

## 2022-05-08 DIAGNOSIS — I10 Essential (primary) hypertension: Secondary | ICD-10-CM | POA: Diagnosis not present

## 2022-05-08 DIAGNOSIS — G729 Myopathy, unspecified: Secondary | ICD-10-CM | POA: Diagnosis not present

## 2022-05-08 DIAGNOSIS — M5020 Other cervical disc displacement, unspecified cervical region: Secondary | ICD-10-CM | POA: Diagnosis not present

## 2022-05-08 DIAGNOSIS — F419 Anxiety disorder, unspecified: Secondary | ICD-10-CM | POA: Diagnosis not present

## 2022-05-08 DIAGNOSIS — E1159 Type 2 diabetes mellitus with other circulatory complications: Secondary | ICD-10-CM | POA: Diagnosis not present

## 2022-05-12 ENCOUNTER — Ambulatory Visit (INDEPENDENT_AMBULATORY_CARE_PROVIDER_SITE_OTHER): Payer: Medicare HMO | Admitting: Family

## 2022-05-12 ENCOUNTER — Encounter: Payer: Self-pay | Admitting: Family

## 2022-05-12 VITALS — BP 138/80 | HR 92 | Temp 98.6°F | Resp 20 | Ht 63.0 in | Wt 188.6 lb

## 2022-05-12 DIAGNOSIS — L989 Disorder of the skin and subcutaneous tissue, unspecified: Secondary | ICD-10-CM | POA: Diagnosis not present

## 2022-05-12 DIAGNOSIS — G894 Chronic pain syndrome: Secondary | ICD-10-CM

## 2022-05-12 DIAGNOSIS — E1159 Type 2 diabetes mellitus with other circulatory complications: Secondary | ICD-10-CM

## 2022-05-12 DIAGNOSIS — F411 Generalized anxiety disorder: Secondary | ICD-10-CM | POA: Diagnosis not present

## 2022-05-12 DIAGNOSIS — F310 Bipolar disorder, current episode hypomanic: Secondary | ICD-10-CM | POA: Diagnosis not present

## 2022-05-12 DIAGNOSIS — I10 Essential (primary) hypertension: Secondary | ICD-10-CM

## 2022-05-12 DIAGNOSIS — E782 Mixed hyperlipidemia: Secondary | ICD-10-CM | POA: Diagnosis not present

## 2022-05-12 MED ORDER — AMLODIPINE BESYLATE 5 MG PO TABS: 5.0000 mg | ORAL_TABLET | Freq: Every day | ORAL | 1 refills | Status: DC

## 2022-05-12 NOTE — Progress Notes (Unsigned)
Provider: Marlowe Sax FNP-C   Buzz Axel, [name], NP  Patient Care Team: Karion Cudd, [name], NP as PCP - General (Family Medicine) Brien Few, MD as Consulting Physician (Obstetrics and Gynecology) Katy Apo, MD as Consulting Physician (Ophthalmology)  Extended Emergency Contact Information Primary Emergency Contact: [place], [place] Phone: [phone] Relation: Son  Code Status: Full Code  Goals of care: Advanced Directive information    10/25/2021   11:06 AM  Advanced Directives  Does Patient Have a Medical Advance Directive? Yes  Type of Paramedic of [place];Living will  Does patient want to make changes to medical advance directive? No - Patient declined  Copy of [place] in Chart? Yes - validated most recent copy scanned in chart (See row information)  Would patient like information on creating a medical advance directive? No - Patient declined     Chief Complaint  Patient presents with  . Medical Management of Chronic Issues    Patient presents today for blood pressure and blood sugar concerns.    HPI:  Pt is a 72 y.o. female seen today for medical management of chronic diseases.     Past Medical History:  Diagnosis Date  . Anemia 02/05/2021  . Anxiety   . Bipolar 1 disorder ([place])   . Bulging of cervical intervertebral disc    C2  . Bulging of thoracic intervertebral disc    t4  . Depression   . Diabetes mellitus without complication ([place])   . GI bleed 02/05/2021  . Hyperlipidemia   . Migraine    in hospital 09/06/21--ruled out for stroke  . PTSD (post-traumatic stress disorder)   . Sad    Past Surgical History:  Procedure Laterality Date  . CATARACT EXTRACTION Right 08/11/2017  . CATARACT EXTRACTION Left 09/01/2017  . EXCISION METACARPAL MASS Left 10/07/2021   Procedure: EXCISION MASS LEFT LONG FINGER;  Surgeon: Leanora Cover, MD;   Location: [facility];  Service: Orthopedics;  Laterality: Left;  45 MIN  . EYE SURGERY    . HEMICOLECTOMY  1997  . JOINT REPLACEMENT  2013   hip    Allergies  Allergen Reactions  . Aspirin Other (See Comments)    History of GI bleed  . Nsaids     History of GI bleed  . Oxycodone     Migraines  . Statins Other (See Comments)    "makes my urine turn brown" Other reaction(s): MUSCLE PAIN "makes my urine turn brown"    Allergies as of 05/12/2022       Reactions   Aspirin Other (See Comments)   History of GI bleed   Nsaids    History of GI bleed   Oxycodone    Migraines   Statins Other (See Comments)   "makes my urine turn brown" Other reaction(s): MUSCLE PAIN "makes my urine turn brown"        Medication List        Accurate as of May 12, 2022 12:08 PM. If you have any questions, ask your nurse or doctor.          Abilify Maintena 300 MG Srer injection Generic drug: ARIPiprazole ER 300 mg every 28 (twenty-eight) days.   Accu-Chek FastClix Lancets Misc Use to test blood sugar three times daily. Dx: E11.9   Adderall XR 5 MG 24 hr capsule Generic drug: amphetamine-dextroamphetamine Take 5 mg by mouth  daily.   ASTAXANTHIN PO Take 12 mg by mouth daily as needed (high blood sugar).   BERBERINE COMPLEX PO Take 2 capsules by mouth daily as needed (high blood sugar).   blood glucose meter kit and supplies Dispense based on patient and insurance preference. Use up to three times daily. (FOR ICD-10 E10.9, E11.9).   clonazePAM 0.5 MG tablet Commonly known as: KLONOPIN Take 1 mg by mouth at bedtime.   FreeStyle Libre 2 Sensor Misc USE TO TEST BLOOD SUGAR THREE TIMES DAILY   gabapentin 300 MG capsule Commonly known as: NEURONTIN TAKE 1 CAPSULE BY MOUTH THREE TIMES DAILY What changed:  how much to take how to take this when to take this additional instructions   lamoTRIgine 200 MG tablet Commonly known as: LAMICTAL Take 200 mg by  mouth 2 (two) times daily. Takes Brand Name ONLY   lisinopril 10 MG tablet Commonly known as: ZESTRIL TAKE 1 TABLET(10 MG) BY MOUTH DAILY   loratadine 10 MG tablet Commonly known as: CLARITIN TAKE 1 TABLET(10 MG) BY MOUTH DAILY   metFORMIN 500 MG tablet Commonly known as: GLUCOPHAGE Take 1 tablet (500 mg total) by mouth 2 (two) times daily with a meal.   ondansetron 4 MG tablet Commonly known as: ZOFRAN Take 4 mg by mouth 2 (two) times daily as needed for vomiting or nausea.   sertraline 25 MG tablet Commonly known as: ZOLOFT Take 25 mg by mouth at bedtime.   traMADol 50 MG tablet Commonly known as: Ultram 1-2 tabs PO q6 hours prn pain   traZODone 50 MG tablet Commonly known as: DESYREL Take 25 mg by mouth at bedtime.   vitamin C 1000 MG tablet Take 1,000 mg by mouth 3 (three) times a week.        Review of Systems  Immunization History  Administered Date(s) Administered  . Fluad Quad(high Dose 65+) 12/21/2018, 12/02/2019, 11/23/2020, 04/09/2022  . Influenza,inj,Quad PF,6+ Mos 04/04/2016  . Influenza-Unspecified 12/29/2013, 12/14/2017  . PFIZER(Purple Top)SARS-COV-2 Vaccination 08/18/2019, 09/12/2019  . Pneumococcal Conjugate-13 01/04/2016  . Pneumococcal Polysaccharide-23 09/30/2017  . Tdap 09/29/2015  . Zoster Recombinat (Shingrix) 12/05/2017, 06/03/2018   Pertinent  Health Maintenance Due  Topic Date Due  . FOOT EXAM  05/28/2021  . MAMMOGRAM  11/06/2021  . HEMOGLOBIN A1C  03/09/2022  . OPHTHALMOLOGY EXAM  04/08/2022  . COLONOSCOPY (Pts 45-56yr Insurance coverage will need to be confirmed)  02/08/2031  . INFLUENZA VACCINE  Completed  . DEXA SCAN  Completed      09/07/2021    5:00 AM 09/18/2021   10:12 AM 10/07/2021   12:50 PM 10/25/2021   11:02 AM 04/09/2022   11:04 AM  Fall Risk  Falls in the past year?  1  1 0  Was there an injury with Fall?  0  0 0  Fall Risk Category Calculator  1  2 0  Fall Risk Category (Retired)  Low  Moderate Low   (RETIRED) Patient Fall Risk Level High fall risk Low fall risk High fall risk Moderate fall risk Low fall risk  Patient at Risk for Falls Due to  History of fall(s)  Impaired balance/gait;Impaired mobility;History of fall(s) No Fall Risks  Fall risk Follow up  Falls evaluation completed  Falls evaluation completed;Education provided;Falls prevention discussed Falls evaluation completed   Functional Status Survey:    Vitals:   05/12/22 1130  BP: 138/80  Pulse: 92  Resp: 20  Temp: 98.6 F (37 C)  SpO2: 96%  Weight: 188 lb  9.6 oz (85.5 kg)  Height: 5' 3"$  (1.6 m)   Body mass index is 33.41 kg/m. Physical Exam  Labs reviewed: Recent Labs    09/06/21 1207 09/06/21 2003  NA 136  --   K 3.7  --   CL 108  --   CO2 22  --   GLUCOSE 106*  --   BUN 21  --   CREATININE 0.91  --   CALCIUM 7.6*  --   MG  --  2.4   Recent Labs    09/06/21 1207  AST 14*  ALT 12  ALKPHOS 56  BILITOT 0.6  PROT 5.7*  ALBUMIN 3.6   Recent Labs    09/06/21 1207  WBC 4.1  NEUTROABS 3.1  HGB 12.3  HCT 37.0  MCV 89.2  PLT 186   Lab Results  Component Value Date   TSH 0.810 09/06/2021   Lab Results  Component Value Date   HGBA1C 6.3 (H) 09/07/2021   Lab Results  Component Value Date   CHOL 235 (H) 09/07/2021   HDL 49 09/07/2021   LDLCALC 157 (H) 09/07/2021   TRIG 145 09/07/2021   CHOLHDL 4.8 09/07/2021    Significant Diagnostic Results in last 30 days:  No results found.  Assessment/Plan 1. Type 2 diabetes mellitus with other circulatory complication, without long-term current use of insulin (HCC) *** - Hemoglobin A1c; Future  2. Primary hypertension *** - TSH; Future - COMPLETE METABOLIC PANEL WITH GFR; Future - CBC with Differential/Platelet; Future  3. Bipolar affective disorder, current episode hypomanic (HCC) ***  4. Mixed hyperlipidemia *** - Lipid panel; Future  5. Generalized anxiety disorder ***    Family/ staff Communication: Reviewed plan of care  with patient  Labs/tests ordered: None   Next Appointment :   Sandrea Hughs, NP

## 2022-05-13 ENCOUNTER — Other Ambulatory Visit: Payer: Medicare HMO

## 2022-05-13 DIAGNOSIS — E782 Mixed hyperlipidemia: Secondary | ICD-10-CM | POA: Diagnosis not present

## 2022-05-13 DIAGNOSIS — I10 Essential (primary) hypertension: Secondary | ICD-10-CM | POA: Diagnosis not present

## 2022-05-13 DIAGNOSIS — E1159 Type 2 diabetes mellitus with other circulatory complications: Secondary | ICD-10-CM

## 2022-05-14 DIAGNOSIS — K5730 Diverticulosis of large intestine without perforation or abscess without bleeding: Secondary | ICD-10-CM | POA: Diagnosis not present

## 2022-05-14 DIAGNOSIS — K648 Other hemorrhoids: Secondary | ICD-10-CM | POA: Diagnosis not present

## 2022-05-14 DIAGNOSIS — D122 Benign neoplasm of ascending colon: Secondary | ICD-10-CM | POA: Diagnosis not present

## 2022-05-14 DIAGNOSIS — Z980 Intestinal bypass and anastomosis status: Secondary | ICD-10-CM | POA: Diagnosis not present

## 2022-05-14 DIAGNOSIS — Z09 Encounter for follow-up examination after completed treatment for conditions other than malignant neoplasm: Secondary | ICD-10-CM | POA: Diagnosis not present

## 2022-05-14 DIAGNOSIS — Z800 Family history of malignant neoplasm of digestive organs: Secondary | ICD-10-CM | POA: Diagnosis not present

## 2022-05-14 DIAGNOSIS — Z86010 Personal history of colonic polyps: Secondary | ICD-10-CM | POA: Diagnosis not present

## 2022-05-14 LAB — CBC WITH DIFFERENTIAL/PLATELET
Absolute Monocytes: 238 cells/uL (ref 200–950)
Basophils Absolute: 0 cells/uL (ref 0–200)
Basophils Relative: 0 %
Eosinophils Absolute: 0 cells/uL — ABNORMAL LOW (ref 15–500)
Eosinophils Relative: 0 %
HCT: 37.7 % (ref 35.0–45.0)
Hemoglobin: 12.8 g/dL (ref 11.7–15.5)
Lymphs Abs: 1046 cells/uL (ref 850–3900)
MCH: 29.6 pg (ref 27.0–33.0)
MCHC: 34 g/dL (ref 32.0–36.0)
MCV: 87.1 fL (ref 80.0–100.0)
MPV: 10 fL (ref 7.5–12.5)
Monocytes Relative: 5.8 %
Neutro Abs: 2817 cells/uL (ref 1500–7800)
Neutrophils Relative %: 68.7 %
Platelets: 211 10*3/uL (ref 140–400)
RBC: 4.33 10*6/uL (ref 3.80–5.10)
RDW: 12.5 % (ref 11.0–15.0)
Total Lymphocyte: 25.5 %
WBC: 4.1 10*3/uL (ref 3.8–10.8)

## 2022-05-14 LAB — COMPLETE METABOLIC PANEL WITH GFR
AG Ratio: 1.8 (calc) (ref 1.0–2.5)
ALT: 16 U/L (ref 6–29)
AST: 15 U/L (ref 10–35)
Albumin: 4.2 g/dL (ref 3.6–5.1)
Alkaline phosphatase (APISO): 81 U/L (ref 37–153)
BUN: 25 mg/dL (ref 7–25)
CO2: 25 mmol/L (ref 20–32)
Calcium: 9.3 mg/dL (ref 8.6–10.4)
Chloride: 101 mmol/L (ref 98–110)
Creat: 0.99 mg/dL (ref 0.60–1.00)
Globulin: 2.4 g/dL (calc) (ref 1.9–3.7)
Glucose, Bld: 140 mg/dL — ABNORMAL HIGH (ref 65–99)
Potassium: 4.6 mmol/L (ref 3.5–5.3)
Sodium: 136 mmol/L (ref 135–146)
Total Bilirubin: 0.3 mg/dL (ref 0.2–1.2)
Total Protein: 6.6 g/dL (ref 6.1–8.1)
eGFR: 60 mL/min/{1.73_m2} (ref >=60)

## 2022-05-14 LAB — HEMOGLOBIN A1C
Hgb A1c MFr Bld: 6.8 % of total Hgb — ABNORMAL HIGH (ref <5.7)
Mean Plasma Glucose: 148 mg/dL
eAG (mmol/L): 8.2 mmol/L

## 2022-05-14 LAB — TSH: TSH: 2.33 mIU/L (ref 0.40–4.50)

## 2022-05-14 LAB — LIPID PANEL
Cholesterol: 245 mg/dL — ABNORMAL HIGH (ref <200)
HDL: 58 mg/dL (ref >=50)
LDL Cholesterol (Calc): 149 mg/dL (calc) — ABNORMAL HIGH
Non-HDL Cholesterol (Calc): 187 mg/dL (calc) — ABNORMAL HIGH (ref <130)
Total CHOL/HDL Ratio: 4.2 (calc) (ref <5.0)
Triglycerides: 237 mg/dL — ABNORMAL HIGH (ref <150)

## 2022-05-16 DIAGNOSIS — D122 Benign neoplasm of ascending colon: Secondary | ICD-10-CM | POA: Diagnosis not present

## 2022-05-18 MED ORDER — GABAPENTIN 300 MG PO CAPS: ORAL_CAPSULE | ORAL | 1 refills | Status: DC

## 2022-05-19 ENCOUNTER — Other Ambulatory Visit: Payer: Self-pay | Admitting: Family

## 2022-05-19 DIAGNOSIS — G894 Chronic pain syndrome: Secondary | ICD-10-CM

## 2022-05-26 ENCOUNTER — Ambulatory Visit: Payer: Medicare HMO | Admitting: Family

## 2022-06-02 ENCOUNTER — Ambulatory Visit: Payer: Medicare HMO | Admitting: Family

## 2022-06-06 ENCOUNTER — Encounter: Payer: Self-pay | Admitting: Family

## 2022-06-06 ENCOUNTER — Ambulatory Visit (INDEPENDENT_AMBULATORY_CARE_PROVIDER_SITE_OTHER): Payer: Medicare HMO | Admitting: Family

## 2022-06-06 VITALS — BP 120/70 | HR 116 | Temp 97.5°F | Resp 20 | Ht 63.0 in | Wt 186.0 lb

## 2022-06-06 DIAGNOSIS — B372 Candidiasis of skin and nail: Secondary | ICD-10-CM

## 2022-06-06 DIAGNOSIS — G729 Myopathy, unspecified: Secondary | ICD-10-CM | POA: Diagnosis not present

## 2022-06-06 DIAGNOSIS — F310 Bipolar disorder, current episode hypomanic: Secondary | ICD-10-CM | POA: Diagnosis not present

## 2022-06-06 DIAGNOSIS — F419 Anxiety disorder, unspecified: Secondary | ICD-10-CM | POA: Diagnosis not present

## 2022-06-06 DIAGNOSIS — E1159 Type 2 diabetes mellitus with other circulatory complications: Secondary | ICD-10-CM | POA: Diagnosis not present

## 2022-06-06 DIAGNOSIS — E1169 Type 2 diabetes mellitus with other specified complication: Secondary | ICD-10-CM

## 2022-06-06 DIAGNOSIS — F4310 Post-traumatic stress disorder, unspecified: Secondary | ICD-10-CM | POA: Diagnosis not present

## 2022-06-06 DIAGNOSIS — G43909 Migraine, unspecified, not intractable, without status migrainosus: Secondary | ICD-10-CM | POA: Diagnosis not present

## 2022-06-06 DIAGNOSIS — M5020 Other cervical disc displacement, unspecified cervical region: Secondary | ICD-10-CM | POA: Diagnosis not present

## 2022-06-06 DIAGNOSIS — E785 Hyperlipidemia, unspecified: Secondary | ICD-10-CM

## 2022-06-06 DIAGNOSIS — I10 Essential (primary) hypertension: Secondary | ICD-10-CM

## 2022-06-06 MED ORDER — FLUCONAZOLE 150 MG PO TABS: ORAL_TABLET | ORAL | 1 refills | Status: DC

## 2022-06-06 MED ORDER — NYSTATIN 100000 UNIT/GM EX CREA: 1.0000 | TOPICAL_CREAM | Freq: Two times a day (BID) | CUTANEOUS | 0 refills | Status: DC

## 2022-06-06 MED ORDER — AMLODIPINE BESYLATE 10 MG PO TABS: 10.0000 mg | ORAL_TABLET | Freq: Every day | ORAL | 1 refills | Status: DC

## 2022-06-06 MED ORDER — SITAGLIPTIN PHOSPHATE 25 MG PO TABS: 25.0000 mg | ORAL_TABLET | Freq: Every day | ORAL | 1 refills | Status: DC

## 2022-06-06 NOTE — Patient Instructions (Addendum)
-   check Blood pressure at home and record on log provided and notify provider if B/p > 140/90   - Notify provider if blood sugars are less than 75 or > 200  Skin Yeast Infection  A skin yeast infection is a condition in which there is an overgrowth of yeast (Candida) that normally lives on the skin. This condition usually occurs in areas of the skin that are constantly warm and moist, such as the skin under the breasts or armpits, or in the groin and other body folds. What are the causes? This condition is caused by a change in the normal balance of the yeast that live on the skin. What increases the risk? You are more likely to develop this condition if you: Are obese. Are pregnant. Are 38 years of age or older. Wear tight clothing. Have any of the following conditions: Diabetes. Malnutrition. A weak body defense system (immune system). Take medicines such as: Birth control pills. Antibiotics. Steroid medicines. What are the signs or symptoms? The most common symptom of this condition is itchiness in the affected area. Other symptoms include: A red, swollen area of the skin. Bumps on the skin. How is this diagnosed? This condition is diagnosed with a medical history and physical exam. Your health care provider may check for yeast by taking scrapings of the skin to be viewed under a microscope. How is this treated? This condition is treated with medicine. Medicines may be prescribed or available over the counter. The medicines may be: Taken by mouth (orally). Applied as a cream or powder to your skin. Follow these instructions at home:  Take or apply over-the-counter and prescription medicines only as told by your health care provider. Maintain a healthy weight. If you need help losing weight, talk with your health care provider. Keep your skin clean and dry. Wear loose-fitting clothing. If you have diabetes, keep your blood sugar under control. Keep all follow-up visits. This  is important. Contact a health care provider if: Your symptoms go away and then come back. Your symptoms do not get better with treatment. Your symptoms get worse. Your rash spreads. You have a fever or chills. You have new symptoms. You have new warmth or redness of your skin. Your rash is painful or bleeding. Summary A skin yeast infection is a condition in which there is an overgrowth of yeast (Candida) that normally lives on the skin. Take or apply over-the-counter and prescription medicines only as told by your health care provider. Keep your skin clean and dry. Contact a health care provider if your symptoms do not get better with treatment. This information is not intended to replace advice given to you by your health care provider. Make sure you discuss any questions you have with your health care provider. Document Revised: 06/05/2020 Document Reviewed: 06/05/2020 Elsevier Patient Education  [place].

## 2022-06-06 NOTE — Progress Notes (Signed)
Provider: Marlowe Sax FNP-C  Vinaya Sancho, [name], NP  Patient Care Team: Osha Errico, [name], NP as PCP - General (Family Medicine) Brien Few, MD as Consulting Physician (Obstetrics and Gynecology) Katy Apo, MD as Consulting Physician (Ophthalmology)  Extended Emergency Contact Information Primary Emergency Contact: [place], [place] Phone: [phone] Relation: Son  Code Status:Full Code  Goals of care: Advanced Directive information    10/25/2021   11:06 AM  Advanced Directives  Does Patient Have a Medical Advance Directive? Yes  Type of Paramedic of [place];Living will  Does patient want to make changes to medical advance directive? No - Patient declined  Copy of [place] in Chart? Yes - validated most recent copy scanned in chart (See row information)  Would patient like information on creating a medical advance directive? No - Patient declined     Chief Complaint  Patient presents with   Follow-up    2 week follow up on blood pressure. Patient presents for a follow up on a rash that was located on the trunk, lower abdominal are, bilateral ears, eyebrows, groin, and under breast. Areas are itchy. Problem has been going on for two days. Prior treatment includes baby powder     HPI:  Pt is a 72 y.o. female seen today for an acute visit for high blood pressure and rash. She was here 2 weeks ago 05/12/2022 for a routine visit B/p was elevated.she was started on amlodipine 5 mg tablet and advised to follow up here today.states Blood pressure at home still runs high in the 130's/90's with most readings in the 140's /80's - 150's/80.she denies any headache,dizziness,vision changes,fatigue,chest tightness,palpitation,chest pain or shortness of breath.    Also complains of itchy red rash on breast and abdominal area for several weeks. No drainage.   Past Medical History:   Diagnosis Date   Anemia 02/05/2021   Anxiety    Bipolar 1 disorder ([place])    Bulging of cervical intervertebral disc    C2   Bulging of thoracic intervertebral disc    t4   Depression    Diabetes mellitus without complication ([place])    GI bleed 02/05/2021   Hyperlipidemia    Migraine    in hospital 09/06/21--ruled out for stroke   PTSD (post-traumatic stress disorder)    Sad    Past Surgical History:  Procedure Laterality Date   CATARACT EXTRACTION Right 08/11/2017   CATARACT EXTRACTION Left 09/01/2017   EXCISION METACARPAL MASS Left 10/07/2021   Procedure: EXCISION MASS LEFT LONG FINGER;  Surgeon: Leanora Cover, MD;  Location: [facility];  Service: Orthopedics;  Laterality: Left;  [place]   JOINT REPLACEMENT  2013   hip    Allergies  Allergen Reactions   Aspirin Other (See Comments)    History of GI bleed   Nsaids     History of GI bleed   Nystatin Other (See Comments)   Oxycodone     Migraines   Statins Other (See Comments)    "makes my urine turn brown" Other reaction(s): MUSCLE PAIN "makes my urine turn brown"    Outpatient Encounter Medications as of 06/06/2022  Medication Sig   ABILIFY MAINTENA 300 MG SRER injection 300 mg every 28 (twenty-eight) days.   Accu-Chek FastClix Lancets MISC Use to test blood sugar three times daily.  Dx: E11.9   ADDERALL XR 5 MG 24 hr capsule Take 5 mg by mouth daily.   Ascorbic Acid (VITAMIN C) 1000 MG tablet Take 1,000 mg by mouth 3 (three) times a week.   ASTAXANTHIN PO Take 12 mg by mouth daily as needed (high blood sugar).   Barberry-Oreg Grape-Goldenseal (BERBERINE COMPLEX PO) Take 2 capsules by mouth daily as needed (high blood sugar).   blood glucose meter kit and supplies Dispense based on patient and insurance preference. Use up to three times daily. (FOR ICD-10 E10.9, E11.9).   clonazePAM (KLONOPIN) 0.5 MG tablet Take 1 mg by mouth at bedtime.   fluconazole (DIFLUCAN) 150  MG tablet Take one tablet by mouth x 1 dose then repeat dose in one week.   gabapentin (NEURONTIN) 300 MG capsule TAKE 1 CAPSULE BY MOUTH TWO TIMES DAILY   lamoTRIgine (LAMICTAL) 200 MG tablet Take 200 mg by mouth 2 (two) times daily. Takes Brand Name ONLY   loratadine (CLARITIN) 10 MG tablet TAKE 1 TABLET(10 MG) BY MOUTH DAILY   nystatin cream (MYCOSTATIN) Apply 1 Application topically 2 (two) times daily.   ondansetron (ZOFRAN) 4 MG tablet Take 4 mg by mouth 2 (two) times daily as needed for vomiting or nausea.   sertraline (ZOLOFT) 25 MG tablet Take 25 mg by mouth at bedtime.   sitaGLIPtin (JANUVIA) 25 MG tablet Take 1 tablet (25 mg total) by mouth daily.   traMADol (ULTRAM) 50 MG tablet 1-2 tabs PO q6 hours prn pain   traZODone (DESYREL) 50 MG tablet Take 25 mg by mouth at bedtime.   [DISCONTINUED] amLODipine (NORVASC) 5 MG tablet Take 1 tablet (5 mg total) by mouth daily.   amLODipine (NORVASC) 10 MG tablet Take 1 tablet (10 mg total) by mouth daily.   metFORMIN (GLUCOPHAGE) 500 MG tablet Take 1 tablet (500 mg total) by mouth 2 (two) times daily with a meal.   [DISCONTINUED] Continuous Blood Gluc Sensor (FREESTYLE LIBRE 2 SENSOR) MISC USE TO TEST BLOOD SUGAR THREE TIMES DAILY   [DISCONTINUED] lisinopril (ZESTRIL) 10 MG tablet TAKE 1 TABLET(10 MG) BY MOUTH DAILY   No facility-administered encounter medications on file as of 06/06/2022.    Review of Systems  Constitutional:  Negative for appetite change, chills, fatigue, fever and unexpected weight change.  HENT:  Negative for congestion, dental problem, ear discharge, ear pain, facial swelling, hearing loss, nosebleeds, postnasal drip, rhinorrhea, sinus pressure, sinus pain, sneezing, sore throat, tinnitus and trouble swallowing.   Eyes:  Negative for pain, discharge, redness, itching and visual disturbance.  Respiratory:  Negative for cough, chest tightness, shortness of breath and wheezing.   Cardiovascular:  Negative for chest pain,  palpitations and leg swelling.  Gastrointestinal:  Negative for abdominal distention, abdominal pain, blood in stool, constipation, diarrhea, nausea and vomiting.  Endocrine: Negative for cold intolerance, heat intolerance, polydipsia, polyphagia and polyuria.  Genitourinary:  Negative for difficulty urinating, dysuria, flank pain, frequency and urgency.  Musculoskeletal:  Negative for arthralgias, back pain, gait problem, joint swelling, myalgias, neck pain and neck stiffness.  Skin:  Positive for rash. Negative for color change, pallor and wound.  Neurological:  Negative for dizziness, syncope, speech difficulty, weakness, light-headedness, numbness and headaches.  Hematological:  Does not bruise/bleed easily.  Psychiatric/Behavioral:  Negative for agitation, behavioral problems, confusion, hallucinations, self-injury, sleep disturbance and suicidal ideas. The patient is not nervous/anxious.     Immunization History  Administered Date(s) Administered   Fluad Quad(high Dose 65+) 12/21/2018, 12/02/2019, 11/23/2020, 04/09/2022  Influenza,inj,Quad PF,6+ Mos 04/04/2016   Influenza-Unspecified 12/29/2013, 12/14/2017   PFIZER(Purple Top)SARS-COV-2 Vaccination 08/18/2019, 09/12/2019   Pneumococcal Conjugate-13 01/04/2016   Pneumococcal Polysaccharide-23 09/30/2017   Tdap 09/29/2015   Zoster Recombinat (Shingrix) 12/05/2017, 06/03/2018   Pertinent  Health Maintenance Due  Topic Date Due   FOOT EXAM  05/28/2021   MAMMOGRAM  11/06/2021   OPHTHALMOLOGY EXAM  04/08/2022   HEMOGLOBIN A1C  11/11/2022   COLONOSCOPY (Pts 45-65yr Insurance coverage will need to be confirmed)  02/08/2031   INFLUENZA VACCINE  Completed   DEXA SCAN  Completed      09/18/2021   10:12 AM 10/07/2021   12:50 PM 10/25/2021   11:02 AM 04/09/2022   11:04 AM 06/06/2022   11:00 AM  Fall Risk  Falls in the past year? 1  1 0 1  Was there an injury with Fall? 0  0 0 0  Fall Risk Category Calculator 1  2 0 2  Fall Risk  Category (Retired) Low  Moderate Low   (RETIRED) Patient Fall Risk Level Low fall risk High fall risk Moderate fall risk Low fall risk   Patient at Risk for Falls Due to History of fall(s)  Impaired balance/gait;Impaired mobility;History of fall(s) No Fall Risks History of fall(s)  Fall risk Follow up Falls evaluation completed  Falls evaluation completed;Education provided;Falls prevention discussed Falls evaluation completed Falls evaluation completed   Functional Status Survey:    Vitals:   06/06/22 2345  BP: 120/70  Pulse: (!) 116  Resp: 20  Temp: (!) 97.5 F (36.4 C)  TempSrc: Temporal  SpO2: 97%  Weight: 186 lb (84.4 kg)  Height: '5\' 3"'$  (1.6 m)   Body mass index is 32.95 kg/m. Physical Exam Vitals reviewed.  Constitutional:      General: She is not in acute distress.    Appearance: Normal appearance. She is obese. She is not ill-appearing or diaphoretic.  HENT:     Head: Normocephalic.     Nose: Nose normal. No congestion or rhinorrhea.     Mouth/Throat:     Mouth: Mucous membranes are moist.     Pharynx: Oropharynx is clear. No oropharyngeal exudate or posterior oropharyngeal erythema.  Eyes:     General: No scleral icterus.       Right eye: No discharge.        Left eye: No discharge.     Conjunctiva/sclera: Conjunctivae normal.     Pupils: Pupils are equal, round, and reactive to light.  Neck:     Vascular: No carotid bruit.  Cardiovascular:     Rate and Rhythm: Normal rate and regular rhythm.     Pulses: Normal pulses.     Heart sounds: Normal heart sounds. No murmur heard.    No friction rub. No gallop.  Pulmonary:     Effort: Pulmonary effort is normal. No respiratory distress.     Breath sounds: Normal breath sounds. No wheezing, rhonchi or rales.  Chest:     Chest wall: No tenderness.  Abdominal:     General: Bowel sounds are normal. There is no distension.     Palpations: Abdomen is soft. There is no mass.     Tenderness: There is no abdominal  tenderness. There is no right CVA tenderness, left CVA tenderness, guarding or rebound.  Musculoskeletal:        General: No swelling or tenderness. Normal range of motion.     Cervical back: Normal range of motion. No rigidity or tenderness.  Right lower leg: No edema.     Left lower leg: No edema.  Lymphadenopathy:     Cervical: No cervical adenopathy.  Skin:    General: Skin is warm and dry.     Coloration: Skin is not pale.     Findings: No bruising, lesion or rash.     Comments: Bilateral breast and abdominal folds beefy skin redness   Neurological:     Mental Status: She is alert and oriented to person, place, and time.     Cranial Nerves: No cranial nerve deficit.     Sensory: No sensory deficit.     Motor: No weakness.     Coordination: Coordination normal.     Gait: Gait normal.  Psychiatric:        Mood and Affect: Mood normal.        Speech: Speech normal.        Behavior: Behavior normal.     Labs reviewed: Recent Labs    09/06/21 1207 09/06/21 2003 05/13/22 0852  NA 136  --  136  K 3.7  --  4.6  CL 108  --  101  CO2 22  --  25  GLUCOSE 106*  --  140*  BUN 21  --  25  CREATININE 0.91  --  0.99  CALCIUM 7.6*  --  9.3  MG  --  2.4  --    Recent Labs    09/06/21 1207 05/13/22 0852  AST 14* 15  ALT 12 16  ALKPHOS 56  --   BILITOT 0.6 0.3  PROT 5.7* 6.6  ALBUMIN 3.6  --    Recent Labs    09/06/21 1207 05/13/22 0852  WBC 4.1 4.1  NEUTROABS 3.1 2,817  HGB 12.3 12.8  HCT 37.0 37.7  MCV 89.2 87.1  PLT 186 211   Lab Results  Component Value Date   TSH 2.33 05/13/2022   Lab Results  Component Value Date   HGBA1C 6.8 (H) 05/13/2022   Lab Results  Component Value Date   CHOL 245 (H) 05/13/2022   HDL 58 05/13/2022   LDLCALC 149 (H) 05/13/2022   TRIG 237 (H) 05/13/2022   CHOLHDL 4.2 05/13/2022    Significant Diagnostic Results in last 30 days:  No results found.  Assessment/Plan 1. Essential hypertension Home blood pressure are  elevated  Will increase amlodipine from 5 mg tablet to 10 mg tablet daily.Advised to take 2 tablet s of current medication until finished. - Advised to check Blood pressure at home and record on log provided and notify provider if B/p > 140/90 - amLODipine (NORVASC) 10 MG tablet; Take 1 tablet (10 mg total) by mouth daily.  Dispense: 90 tablet; Refill: 1  2. Type 2 diabetes mellitus with hyperlipidemia ([facility]) Lab Results  Component Value Date   HGBA1C 6.8 (H) 05/13/2022  CBG runs in the 90's -130's with other readings in the 150-190's  - continue on metformin and add Januvia  - sitaGLIPtin (JANUVIA) 25 MG tablet; Take 1 tablet (25 mg total) by mouth daily.  Dispense: 30 tablet; Refill: 1  3. Candidiasis of skin Bilateral breast folds and abdominal folds beefy skin redness Advised to cleanse fold and pat dry after showering then apply Nystatin as below.  - nystatin cream (MYCOSTATIN); Apply 1 Application topically 2 (two) times daily.  Dispense: 30 g; Refill: 0 - fluconazole (DIFLUCAN) 150 MG tablet; Take one tablet by mouth x 1 dose then repeat dose in one week.  Dispense:  2 tablet; Refill: 1  Family/ staff Communication: Reviewed plan of care with patient verbalized understanding.   Labs/tests ordered: None   Next Appointment: Return in about 11 weeks (around 08/22/2022) for medical mangement of chronic issues.Sandrea Hughs, NP

## 2022-06-10 ENCOUNTER — Telehealth: Payer: Self-pay

## 2022-06-10 DIAGNOSIS — F419 Anxiety disorder, unspecified: Secondary | ICD-10-CM | POA: Diagnosis not present

## 2022-06-10 DIAGNOSIS — I10 Essential (primary) hypertension: Secondary | ICD-10-CM | POA: Diagnosis not present

## 2022-06-10 DIAGNOSIS — E785 Hyperlipidemia, unspecified: Secondary | ICD-10-CM | POA: Diagnosis not present

## 2022-06-10 DIAGNOSIS — M5020 Other cervical disc displacement, unspecified cervical region: Secondary | ICD-10-CM | POA: Diagnosis not present

## 2022-06-10 DIAGNOSIS — G43909 Migraine, unspecified, not intractable, without status migrainosus: Secondary | ICD-10-CM | POA: Diagnosis not present

## 2022-06-10 DIAGNOSIS — F4310 Post-traumatic stress disorder, unspecified: Secondary | ICD-10-CM | POA: Diagnosis not present

## 2022-06-10 DIAGNOSIS — F310 Bipolar disorder, current episode hypomanic: Secondary | ICD-10-CM | POA: Diagnosis not present

## 2022-06-10 DIAGNOSIS — G729 Myopathy, unspecified: Secondary | ICD-10-CM | POA: Diagnosis not present

## 2022-06-10 DIAGNOSIS — E1159 Type 2 diabetes mellitus with other circulatory complications: Secondary | ICD-10-CM | POA: Diagnosis not present

## 2022-06-10 NOTE — Telephone Encounter (Signed)
Patient medication has been sent to the pharmacy ?

## 2022-06-10 NOTE — Telephone Encounter (Signed)
Kaitlyn Wood from [facility] call this afternoon and would like send a message to an order to [place], Dinah C, NP to do a re certification to be seen for a one month injection for a week at the patient home.  Message has been routed to [place], [name], NP

## 2022-06-10 NOTE — Telephone Encounter (Signed)
Okay to give [facility] verbal orders for re-certification for one month injection.

## 2022-06-11 NOTE — Telephone Encounter (Signed)
Spoke with the patient Home [facility] at [facility] and to let him know that her Nget[place], Dinah C, NP has okay for verbal orders for re-certification for the one month injection to be seen in the home.  Message routed back to [place], [name], NP

## 2022-06-16 DIAGNOSIS — L820 Inflamed seborrheic keratosis: Secondary | ICD-10-CM | POA: Diagnosis not present

## 2022-06-18 ENCOUNTER — Ambulatory Visit: Payer: Medicare HMO | Admitting: Neurology

## 2022-06-18 ENCOUNTER — Encounter: Payer: Self-pay | Admitting: Neurology

## 2022-06-18 VITALS — BP 164/89 | HR 89 | Ht 63.0 in | Wt 191.0 lb

## 2022-06-18 DIAGNOSIS — R42 Dizziness and giddiness: Secondary | ICD-10-CM | POA: Diagnosis not present

## 2022-06-18 DIAGNOSIS — G43009 Migraine without aura, not intractable, without status migrainosus: Secondary | ICD-10-CM

## 2022-06-18 NOTE — Progress Notes (Signed)
[facility] [address] [place]. [place] [address] [phone]       OFFICE FOLLOW-UP NOTE  [name] Date of Birth:  [birth year] Medical Record Number:  [id number]   HPI: [name] is a 72 year old pleasant Caucasian lady seen today for office follow-up visit after hospital consultation for this episode in June 2023.  History is obtained from the patient and I have personally reviewed pertinent available imaging films and PACS.  Kaitlyn Wood is a 72 y.o. Caucasian female with PMH of bipolar disorder on Lamictal, diabetes, hypertension admitted for episodes of blurry vision, dizziness, imbalance and questionable nystagmus.  Per patient, on Wednesday she was walking in the kitchen had a sudden onset nausea, dizziness, room spinning with halos in her vision, and imbalance.  She tried to sit down but ended up falling backwards and hitting her head, loss of consciousness.  Did not know how long she passed out but her friend told her not too long.  After woke up, she crawled to her bed and went to sleep.  Thursday she woke up feeling fine and did well the whole day.  However, on Friday morning around 9:30 AM she had sudden onset "eye wiggling, jumping around", associate with dizziness, room spinning.  She denies any nausea vomiting, double vision, falling.  EMS was called, she was brought to [facility] ER for evaluation.  Symptoms lasted about 1 hour and resolved. But she also endorsed headache at the back of the head most of the day on Friday. CTA head and neck and MRI brain all negative. She was transferred to [facility] for further evaluation.   Patient stated that she has a history of posterior migraine versus cervical DDD since age 80, getting worse since last year, more frequent than before.  Not able to take NSAIDs or aspirin due to diverticulosis.  CT scan of the head and MRI scan of the brain were both negative for acute stroke or abnormalities.  CT angiogram of the brain and neck did  not show any large vessel stenosis or occlusion.  EEG showed mild diffuse slowing but no seizures.  LDL cholesterol is 157 mg percent and hemoglobin A1c was 6.3.  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  Denies any significant migraine headaches though she continues to have some occasional posterior headaches which are not bothersome.  Has not had any further stroke or TIA-like symptoms.  Patient states she has history of statin allergies hence she is only on diet controll for her lipid.  She is on Norvasc for blood pressure which normally is better controlled at home though it is elevated at 160/89 today.  He states that sugars are under good control.  He has no new complaints  ROS:   14 system review of systems is positive for dizziness, nausea, eye movements, headache and all other systems negative  PMH:  Past Medical History:  Diagnosis Date   Anemia 02/05/2021   Anxiety    Bipolar 1 disorder (HCC)    Bulging of cervical intervertebral disc    C2   Bulging of thoracic intervertebral disc    t4   Depression    Diabetes mellitus without complication ([place])    GI bleed 02/05/2021   Hyperlipidemia    Migraine    in hospital 09/06/21--ruled out for stroke   PTSD (post-traumatic stress disorder)    Sad     Social History:  Social History   Socioeconomic History  Marital status: Divorced    Spouse name: Not on file   Number of children: Not on file   Years of education: Not on file   Highest education level: Not on file  Occupational History   Not on file  Tobacco Use   Smoking status: Never   Smokeless tobacco: Never  Vaping Use   Vaping Use: Never used  Substance and Sexual Activity   Alcohol use: No   Drug use: No   Sexual activity: Not Currently  Other Topics Concern   Not on file  Social History Narrative   Diet? ? Fresh food- very little prepared food- no fast food.       Do you drink/eat things with caffeine?  Yes       Marital status?         Divorced                           What year were you married? 1978      Do you live in a house, apartment, assisted living, condo, trailer, etc.? apartment      Is it one or more stories? Yes      How many persons live in your home? 1      Do you have any pets in your home? (please list)  1 cat, Polly      Current or past profession: Air traffic controller, sales       Do you exercise?       yes                               Type & how often? 2-3 times a week      Do you have a living will? No      Do you have a DNR form?    no                              If not, do you want to discuss one? yes      Do you have signed POA/HPOA for forms? no   Social Determinants of Health   Financial Resource Strain: Low Risk  (09/30/2017)   Overall Financial Resource Strain (CARDIA)    Difficulty of Paying Living Expenses: Not hard at all  Food Insecurity: No Food Insecurity (09/30/2017)   Hunger Vital Sign    Worried About Running Out of Food in the Last Year: Never true    [place] in the Last Year: Never true  Transportation Needs: No Transportation Needs (09/30/2017)   PRAPARE - Hydrologist (Medical): No    Lack of Transportation (Non-Medical): No  Physical Activity: Inactive (09/30/2017)   Exercise Vital Sign    Days of Exercise per Week: 0 days    Minutes of Exercise per Session: 0 min  Stress: Stress Concern Present (09/30/2017)   [place]    Feeling of Stress : Rather much  Social Connections: Socially Isolated (09/30/2017)   Social Connection and Isolation Panel [NHANES]    Frequency of Communication with Friends and Family: Once a week    Frequency of Social Gatherings with Friends and Family: Once a week    Attends Religious Services: Never    Marine scientist or Organizations: No  Attends Archivist Meetings: Never    Marital Status:  Divorced  Human resources officer Violence: Not At Risk (09/30/2017)   Humiliation, Afraid, Rape, and Kick questionnaire    Fear of Current or Ex-Partner: No    Emotionally Abused: No    Physically Abused: No    Sexually Abused: No    Medications:   Current Outpatient Medications on File Prior to Visit  Medication Sig Dispense Refill   ABILIFY MAINTENA 300 MG SRER injection 300 mg every 28 (twenty-eight) days.     Accu-Chek FastClix Lancets MISC Use to test blood sugar three times daily. Dx: E11.9 306 each 11   ADDERALL XR 5 MG 24 hr capsule Take 5 mg by mouth daily.     amLODipine (NORVASC) 10 MG tablet Take 1 tablet (10 mg total) by mouth daily. 90 tablet 1   Ascorbic Acid (VITAMIN C) 1000 MG tablet Take 1,000 mg by mouth 3 (three) times a week.     ASTAXANTHIN PO Take 12 mg by mouth daily as needed (high blood sugar).     Barberry-Oreg Grape-Goldenseal (BERBERINE COMPLEX PO) Take 2 capsules by mouth daily as needed (high blood sugar).     blood glucose meter kit and supplies Dispense based on patient and insurance preference. Use up to three times daily. (FOR ICD-10 E10.9, E11.9). 1 each 0   clonazePAM (KLONOPIN) 0.5 MG tablet Take 1 mg by mouth at bedtime.     gabapentin (NEURONTIN) 300 MG capsule TAKE 1 CAPSULE BY MOUTH TWO TIMES DAILY 270 capsule 1   lamoTRIgine (LAMICTAL) 200 MG tablet Take 200 mg by mouth 2 (two) times daily. Takes Brand Name ONLY     loratadine (CLARITIN) 10 MG tablet TAKE 1 TABLET(10 MG) BY MOUTH DAILY (Patient taking differently: Take 10 mg by mouth as needed.) 90 tablet 1   metFORMIN (GLUCOPHAGE) 500 MG tablet Take 1 tablet (500 mg total) by mouth 2 (two) times daily with a meal. 180 tablet 0   nystatin cream (MYCOSTATIN) Apply 1 Application topically 2 (two) times daily. (Patient taking differently: Apply 1 Application topically 2 (two) times daily as needed.) 30 g 0   ondansetron (ZOFRAN) 4 MG tablet Take 4 mg by mouth 2 (two) times daily as needed for vomiting or  nausea.     sertraline (ZOLOFT) 25 MG tablet Take 25 mg by mouth at bedtime.     sitaGLIPtin (JANUVIA) 25 MG tablet Take 1 tablet (25 mg total) by mouth daily. 30 tablet 1   traMADol (ULTRAM) 50 MG tablet 1-2 tabs PO q6 hours prn pain (Patient taking differently: Take 50 mg by mouth as needed. 1-2 tabs PO q6 hours prn pain) 20 tablet 0   traZODone (DESYREL) 50 MG tablet Take 25 mg by mouth at bedtime.     No current facility-administered medications on file prior to visit.    Allergies:   Allergies  Allergen Reactions   Aspirin Other (See Comments)    History of GI bleed   Nsaids     History of GI bleed   Oxycodone     Migraines   Statins Other (See Comments)    "makes my urine turn brown" Other reaction(s): MUSCLE PAIN "makes my urine turn brown"    Physical Exam General: well developed, well nourished pleasant elderly Caucasian lady, seated, in no evident distress Head: head normocephalic and atraumatic.  Neck: supple with no carotid or supraclavicular bruits Cardiovascular: regular rate and rhythm, no murmurs Musculoskeletal: no deformity Skin:  no rash/petichiae Vascular:  Normal pulses all extremities Vitals:   06/18/22 1249  BP: (!) 164/89  Pulse: 89   Neurologic Exam Mental Status: Awake and fully alert. Oriented to place and time. Recent and remote memory intact. Attention span, concentration and fund of knowledge appropriate. Mood and affect appropriate.  Cranial Nerves: Fundoscopic exam reveals sharp disc margins. Pupils equal, briskly reactive to light. Extraocular movements full without nystagmus. Visual fields full to confrontation. Hearing intact. Facial sensation intact. Face, tongue, palate moves normally and symmetrically.  Motor: Normal bulk and tone. Normal strength in all tested extremity muscles. Sensory.: intact to touch ,pinprick .position and vibratory sensation.  Coordination: Rapid alternating movements normal in all extremities. Finger-to-nose and  heel-to-shin performed accurately bilaterally.  Positive Fukuda stepping test with patient moving several steps of the previous and rotating to the right partially. Gait and Station: Arises from chair without difficulty. Stance is normal. Gait demonstrates normal stride length and balance . Able to heel, toe and tandem walk with moderate difficulty.  Reflexes: 1+ and symmetric. Toes downgoing.      ASSESSMENT: 72 year old Caucasian lady with transient episode of dizziness, nausea and subjective nystagmus in June 2023 of unclear etiology.  Possibly atypical migraine versus peripheral vestibular dysfunction.  Negative neurovascular workup.  Vascular risk factors of hyperlipidemia and diabetes.    PLAN:I had a long discussion with the patient with regards to her episode of transient dizziness, nausea and imbalance being of unclear etiology possibly atypical migraine versus peripheral vestibular dysfunction.  Since she is doing well without any recurrent symptoms I do not recommend any further neurovascular testing time.  In case she starts having recurrent transient vertigo may refer to vestibular rehab for vertigo exercises.  She may consider starting PCSK9 inhibitor injections for hyperlipidemia and she plans to discuss this with her primary care physician.  Return for follow-up with me in the future only as needed.  Greater than 50% of time during this 35 minute visit was spent on counseling,explanation of diagnosis of atypical migraine versus vertigo, planning of further management, discussion with patient and family and coordination of care Antony Contras, MD Note: This document was prepared with digital dictation and possible smart phrase technology. Any transcriptional errors that result from this process are unintentional

## 2022-06-18 NOTE — Patient Instructions (Addendum)
I had a long discussion with the patient with regards to her episode of transient dizziness, nausea and imbalance being of unclear etiology possibly atypical migraine versus peripheral vestibular dysfunction.  Since she is doing well without any recurrent symptoms I do not recommend any further neurovascular testing time.  In case she starts having recurrent transient vertigo may refer to vestibular rehab for vertigo exercises.  She may consider starting PCSK9 inhibitor injections for hyperlipidemia and she plans to discuss this with her primary care physician.  Return for follow-up with me in the future only as needed.

## 2022-06-25 ENCOUNTER — Encounter: Payer: Self-pay | Admitting: Family

## 2022-06-25 ENCOUNTER — Ambulatory Visit (INDEPENDENT_AMBULATORY_CARE_PROVIDER_SITE_OTHER): Payer: Medicare HMO | Admitting: Family

## 2022-06-25 VITALS — BP 122/70 | HR 96 | Temp 98.5°F | Resp 16 | Ht 63.0 in | Wt 186.6 lb

## 2022-06-25 DIAGNOSIS — G720 Drug-induced myopathy: Secondary | ICD-10-CM

## 2022-06-25 DIAGNOSIS — T466X5A Adverse effect of antihyperlipidemic and antiarteriosclerotic drugs, initial encounter: Secondary | ICD-10-CM

## 2022-06-25 DIAGNOSIS — I10 Essential (primary) hypertension: Secondary | ICD-10-CM

## 2022-06-25 MED ORDER — BLOOD PRESSURE CUFF MISC: 1.0000 | Freq: Every day | 0 refills | Status: DC

## 2022-06-25 MED ORDER — BLOOD PRESSURE CUFF MISC: 1.0000 | Freq: Every day | 0 refills | Status: AC

## 2022-06-25 NOTE — Progress Notes (Addendum)
Provider: Marlowe Sax FNP-C  Kaitlyn Wood, [name], NP  Patient Care Team: Lanyiah Brix, [name], NP as PCP - General (Family Medicine) Brien Few, MD as Consulting Physician (Obstetrics and Gynecology) Katy Apo, MD as Consulting Physician (Ophthalmology)  Extended Emergency Contact Information Primary Emergency Contact: [place], [place] Phone: [phone] Relation: Son  Code Status:  Full Code  Goals of care: Advanced Directive information    10/25/2021   11:06 AM  Advanced Directives  Does Patient Have a Medical Advance Directive? Yes  Type of Paramedic of [place];Living will  Does patient want to make changes to medical advance directive? No - Patient declined  Copy of [place] in Chart? Yes - validated most recent copy scanned in chart (See row information)  Would patient like information on creating a medical advance directive? No - Patient declined     Chief Complaint  Patient presents with   Hypertension    BP is still elevated. All over the place. 145/90 is pretty common.     HPI:  Pt is a 72 y.o. female seen today for an acute visit for evaluation of high blood pressure.States for the past few days Readings have been in the 140's/80's - 150's/80's.Blood pressure within normal range today.suspect possible her blood pressure cuff needs replacement has had it for 5 yrs.  She denies any headache,vision changes,fatigue,chest tightness,palpitation,chest pain or shortness of breath.  States has been having more anxiety due to preparation to move to [place] close to the family.    Past Medical History:  Diagnosis Date   Anemia 02/05/2021   Anxiety    Bipolar 1 disorder ([place])    Bulging of cervical intervertebral disc    C2   Bulging of thoracic intervertebral disc    t4   Depression    Diabetes mellitus without complication ([place])    GI bleed 02/05/2021    Hyperlipidemia    Migraine    in hospital 09/06/21--ruled out for stroke   PTSD (post-traumatic stress disorder)    Sad    Past Surgical History:  Procedure Laterality Date   CATARACT EXTRACTION Right 08/11/2017   CATARACT EXTRACTION Left 09/01/2017   EXCISION METACARPAL MASS Left 10/07/2021   Procedure: EXCISION MASS LEFT LONG FINGER;  Surgeon: Leanora Cover, MD;  Location: [facility];  Service: Orthopedics;  Laterality: Left;  [place]   JOINT REPLACEMENT  2013   hip    Allergies  Allergen Reactions   Aspirin Other (See Comments)    History of GI bleed   Nsaids     History of GI bleed   Oxycodone     Migraines   Statins Other (See Comments)    "makes my urine turn brown" Other reaction(s): MUSCLE PAIN "makes my urine turn brown"    Outpatient Encounter Medications as of 06/25/2022  Medication Sig   ABILIFY MAINTENA 300 MG SRER injection 300 mg every 28 (twenty-eight) days.   Accu-Chek FastClix Lancets MISC Use to test blood sugar three times daily. Dx: E11.9   ADDERALL XR 5 MG 24 hr capsule Take 5 mg by mouth daily.   amLODipine (NORVASC) 10 MG tablet Take 1 tablet (10 mg total) by mouth daily.   Ascorbic Acid (VITAMIN C) 1000 MG tablet Take 1,000 mg by mouth 3 (three) times a week.  ASTAXANTHIN PO Take 12 mg by mouth daily as needed (high blood sugar).   Barberry-Oreg Grape-Goldenseal (BERBERINE COMPLEX PO) Take 2 capsules by mouth daily as needed (high blood sugar).   blood glucose meter kit and supplies Dispense based on patient and insurance preference. Use up to three times daily. (FOR ICD-10 E10.9, E11.9).   clonazePAM (KLONOPIN) 0.5 MG tablet Take 1 mg by mouth at bedtime.   gabapentin (NEURONTIN) 300 MG capsule TAKE 1 CAPSULE BY MOUTH TWO TIMES DAILY   lamoTRIgine (LAMICTAL) 200 MG tablet Take 200 mg by mouth 2 (two) times daily. Takes Brand Name ONLY   loratadine (CLARITIN) 10 MG tablet TAKE 1 TABLET(10 MG) BY  MOUTH DAILY (Patient taking differently: Take 10 mg by mouth as needed.)   nystatin cream (MYCOSTATIN) Apply 1 Application topically 2 (two) times daily. (Patient taking differently: Apply 1 Application topically 2 (two) times daily as needed.)   ondansetron (ZOFRAN) 4 MG tablet Take 4 mg by mouth 2 (two) times daily as needed for vomiting or nausea.   sertraline (ZOLOFT) 25 MG tablet Take 25 mg by mouth at bedtime.   sitaGLIPtin (JANUVIA) 25 MG tablet Take 1 tablet (25 mg total) by mouth daily.   traMADol (ULTRAM) 50 MG tablet 1-2 tabs PO q6 hours prn pain (Patient taking differently: Take 50 mg by mouth as needed. 1-2 tabs PO q6 hours prn pain)   traZODone (DESYREL) 50 MG tablet Take 25 mg by mouth at bedtime.   metFORMIN (GLUCOPHAGE) 500 MG tablet Take 1 tablet (500 mg total) by mouth 2 (two) times daily with a meal.   No facility-administered encounter medications on file as of 06/25/2022.    Review of Systems  Constitutional:  Negative for appetite change, chills, fatigue, fever and unexpected weight change.  HENT:  Negative for congestion, ear discharge, ear pain, hearing loss, nosebleeds, postnasal drip, rhinorrhea, sinus pressure, sinus pain, sneezing, sore throat and tinnitus.   Eyes:  Positive for visual disturbance. Negative for pain, discharge, redness and itching.       Blurry vision on right eye follows up with Ophthalmologist  Respiratory:  Negative for cough, chest tightness, shortness of breath and wheezing.   Cardiovascular:  Negative for chest pain, palpitations and leg swelling.  Gastrointestinal:  Negative for abdominal distention, abdominal pain, constipation and diarrhea.  Musculoskeletal:  Negative for arthralgias, back pain, gait problem, joint swelling, myalgias, neck pain and neck stiffness.  Skin:  Negative for color change, pallor and rash.  Neurological:  Negative for weakness, light-headedness, numbness and headaches.       Reports dizziness sometimes with loss of  balance towards right side was seen by Neurologist prior to visit     Immunization History  Administered Date(s) Administered   Fluad Quad(high Dose 65+) 12/21/2018, 12/02/2019, 11/23/2020, 04/09/2022   Influenza,inj,Quad PF,6+ Mos 04/04/2016   Influenza-Unspecified 12/29/2013, 12/14/2017   PFIZER(Purple Top)SARS-COV-2 Vaccination 08/18/2019, 09/12/2019   Pneumococcal Conjugate-13 01/04/2016   Pneumococcal Polysaccharide-23 09/30/2017   Tdap 09/29/2015   Zoster Recombinat (Shingrix) 12/05/2017, 06/03/2018   Pertinent  Health Maintenance Due  Topic Date Due   FOOT EXAM  05/28/2021   MAMMOGRAM  11/06/2021   OPHTHALMOLOGY EXAM  04/08/2022   HEMOGLOBIN A1C  11/11/2022   COLONOSCOPY (Pts 45-50yrs Insurance coverage will need to be confirmed)  02/08/2031   INFLUENZA VACCINE  Completed   DEXA SCAN  Completed      09/18/2021   10:12 AM 10/07/2021   12:50 PM 10/25/2021   11:02 AM 04/09/2022  11:04 AM 06/06/2022   11:00 AM  Fall Risk  Falls in the past year? 1  1 0 1  Was there an injury with Fall? 0  0 0 0  Fall Risk Category Calculator 1  2 0 2  Fall Risk Category (Retired) Low  Moderate Low   (RETIRED) Patient Fall Risk Level Low fall risk High fall risk Moderate fall risk Low fall risk   Patient at Risk for Falls Due to History of fall(s)  Impaired balance/gait;Impaired mobility;History of fall(s) No Fall Risks History of fall(s)  Fall risk Follow up Falls evaluation completed  Falls evaluation completed;Education provided;Falls prevention discussed Falls evaluation completed Falls evaluation completed   Functional Status Survey:    Vitals:   06/25/22 1511  BP: 122/70  Pulse: 96  Resp: 16  Temp: 98.5 F (36.9 C)  TempSrc: Temporal  SpO2: 98%  Weight: 186 lb 9.6 oz (84.6 kg)  Height: 5\' 3"  (1.6 m)   Body mass index is 33.05 kg/m. Physical Exam Vitals reviewed.  Constitutional:      General: She is not in acute distress.    Appearance: Normal appearance. She is obese.  She is not ill-appearing or diaphoretic.  HENT:     Head: Normocephalic.     Right Ear: Tympanic membrane, ear canal and external ear normal. There is no impacted cerumen.     Left Ear: Tympanic membrane, ear canal and external ear normal. There is no impacted cerumen.     Nose: Nose normal. No congestion or rhinorrhea.     Mouth/Throat:     Mouth: Mucous membranes are moist.     Pharynx: Oropharynx is clear. No oropharyngeal exudate or posterior oropharyngeal erythema.  Eyes:     General: No scleral icterus.       Right eye: No discharge.        Left eye: No discharge.     Extraocular Movements: Extraocular movements intact.     Conjunctiva/sclera: Conjunctivae normal.     Pupils: Pupils are equal, round, and reactive to light.  Neck:     Vascular: No carotid bruit.  Cardiovascular:     Rate and Rhythm: Normal rate and regular rhythm.     Pulses: Normal pulses.     Heart sounds: Normal heart sounds. No murmur heard.    No friction rub. No gallop.  Pulmonary:     Effort: Pulmonary effort is normal. No respiratory distress.     Breath sounds: Normal breath sounds. No wheezing, rhonchi or rales.  Chest:     Chest wall: No tenderness.  Abdominal:     General: Bowel sounds are normal. There is no distension.     Palpations: Abdomen is soft. There is no mass.     Tenderness: There is no abdominal tenderness. There is no right CVA tenderness, left CVA tenderness, guarding or rebound.  Musculoskeletal:        General: No swelling or tenderness. Normal range of motion.     Cervical back: Normal range of motion. No rigidity or tenderness.     Right lower leg: No edema.     Left lower leg: No edema.  Lymphadenopathy:     Cervical: No cervical adenopathy.  Skin:    General: Skin is warm and dry.     Coloration: Skin is not pale.     Findings: No erythema or rash.  Neurological:     Mental Status: She is alert and oriented to person, place, and time.  Cranial Nerves: No cranial  nerve deficit.     Sensory: No sensory deficit.     Motor: No weakness.     Coordination: Coordination normal.     Gait: Gait normal.  Psychiatric:        Mood and Affect: Mood normal.        Speech: Speech normal.        Behavior: Behavior normal.    Labs reviewed: Recent Labs    09/06/21 1207 09/06/21 2003 05/13/22 0852  NA 136  --  136  K 3.7  --  4.6  CL 108  --  101  CO2 22  --  25  GLUCOSE 106*  --  140*  BUN 21  --  25  CREATININE 0.91  --  0.99  CALCIUM 7.6*  --  9.3  MG  --  2.4  --    Recent Labs    09/06/21 1207 05/13/22 0852  AST 14* 15  ALT 12 16  ALKPHOS 56  --   BILITOT 0.6 0.3  PROT 5.7* 6.6  ALBUMIN 3.6  --    Recent Labs    09/06/21 1207 05/13/22 0852  WBC 4.1 4.1  NEUTROABS 3.1 2,817  HGB 12.3 12.8  HCT 37.0 37.7  MCV 89.2 87.1  PLT 186 211   Lab Results  Component Value Date   TSH 2.33 05/13/2022   Lab Results  Component Value Date   HGBA1C 6.8 (H) 05/13/2022   Lab Results  Component Value Date   CHOL 245 (H) 05/13/2022   HDL 58 05/13/2022   LDLCALC 149 (H) 05/13/2022   TRIG 237 (H) 05/13/2022   CHOLHDL 4.2 05/13/2022    Significant Diagnostic Results in last 30 days:  No results found.  Assessment/Plan  Essential hypertension Reports elevated readings at home though seems her B/p cuff is defective.B/p here today is within normal range.Her anxiety could be a contributor factor. She is in the process of moving to [place] close to family. - will have her purchase new B/p cuff machine then check BP and notify provider if > 140/90   - request Omron silver Digital Blood pressure cuff Script written and given to take to pharmacy.But later request send to pharmacy to indicate " dispense as written". - For home use only DME Other see comment - Blood Pressure Monitoring (BLOOD PRESSURE CUFF) MISC; 1 Device by Does not apply route daily.  Dispense: 1 each; Refill: 0  2. Statin myopathy Intolerance to statin  - continue  with dietary modification and exercise   Family/ staff Communication: Reviewed plan of care with patient verbalized understanding.   Labs/tests ordered: None   Next Appointment: Return if symptoms worsen or fail to improve.   Kaitlyn Bookman, NP

## 2022-06-25 NOTE — Patient Instructions (Addendum)
-   check Blood pressure at home and record on log provided and notify provider if B/p > 140/90  ? ?

## 2022-06-26 ENCOUNTER — Ambulatory Visit: Payer: Medicare HMO | Admitting: Family

## 2022-07-02 DIAGNOSIS — H43811 Vitreous degeneration, right eye: Secondary | ICD-10-CM | POA: Diagnosis not present

## 2022-07-02 DIAGNOSIS — E119 Type 2 diabetes mellitus without complications: Secondary | ICD-10-CM | POA: Diagnosis not present

## 2022-07-02 DIAGNOSIS — Z961 Presence of intraocular lens: Secondary | ICD-10-CM | POA: Diagnosis not present

## 2022-07-02 DIAGNOSIS — H26491 Other secondary cataract, right eye: Secondary | ICD-10-CM | POA: Diagnosis not present

## 2022-07-03 ENCOUNTER — Encounter: Payer: Self-pay | Admitting: Family

## 2022-07-04 ENCOUNTER — Other Ambulatory Visit: Payer: Medicare HMO | Admitting: Pharmacist

## 2022-07-04 ENCOUNTER — Other Ambulatory Visit: Payer: Self-pay | Admitting: Family

## 2022-07-04 DIAGNOSIS — E1159 Type 2 diabetes mellitus with other circulatory complications: Secondary | ICD-10-CM

## 2022-07-04 DIAGNOSIS — G720 Drug-induced myopathy: Secondary | ICD-10-CM | POA: Insufficient documentation

## 2022-07-04 NOTE — Telephone Encounter (Signed)
Patient medication states that it's ended.

## 2022-07-04 NOTE — Progress Notes (Signed)
07/04/2022 Name: Kaitlyn Wood MRN: [id number] DOB: 12-02-1950   Kaitlyn Wood is a 72 y.o. year old female who presented for a telephone visit.   They were referred to the pharmacist by a quality report for assistance in managing  quality metrics: controlling blood pressure (CBP), med adherence diabetes (MAD), statin use in persons with diabetes (SUPD) .    Subjective:  Care Team: Primary Care Provider: Caesar BookmanNgetich, Dinah C, NP ; Next Scheduled Visit: 08/18/22  Medication Access/Adherence  Current Pharmacy:  [facility] - [place], [place] - [address] [place] [id number] Phone: [phone] Fax: [phone]  [facility][id number] [place] Come- [place], [place] - [address] [place] [id number] Phone: [phone] Fax: [phone]  [facility][id number] - [place], [place] - [address] [facility] [address] [place] [id number] Phone: [phone] Fax: [phone]   Patient reports affordability concerns with their medications: No  Patient reports access/transportation concerns to their pharmacy: Yes  Patient reports adherence concerns with their medications:   patient reports night time adherence is great, but daytime sometimes forgets    Patient is under the program of Extra Help Program.   Diabetes:  Current medications: metformin 500mg  BID, januvia 25mg  daily  Current medication access support: Extra Help LIS  Hypertension:  Current medications: amlodipine 10mg  daily,  Medications previously tried: lisinopril, she is unsure why this was transitioned to amlodipine  Patient does not have a validated, automated, upper arm home BP cuff Current blood pressure readings readings: 136/92 this morning  Hyperlipidemia/ASCVD Risk Reduction  Current lipid lowering medications: none at present, lifestyle modifications only Medications tried in the past: uncertain  which statins, zetia     Objective:  Lab Results  Component Value Date   HGBA1C 6.8 (H) 05/13/2022    Lab Results  Component Value Date   CREATININE 0.99 05/13/2022   BUN 25 05/13/2022   NA 136 05/13/2022   K 4.6 05/13/2022   CL 101 05/13/2022   CO2 25 05/13/2022    Lab Results  Component Value Date   CHOL 245 (H) 05/13/2022   HDL 58 05/13/2022   LDLCALC 149 (H) 05/13/2022   TRIG 237 (H) 05/13/2022   CHOLHDL 4.2 05/13/2022    Medications Reviewed Today     Reviewed by Jama FlavorsMathews, Arielle N, CMA (Certified Medical Assistant) on 06/25/22 at 1511  Med List Status: <None>   Medication Order Taking? Sig Documenting Provider Last Dose Status Informant  ABILIFY MAINTENA 300 MG SRER injection [id number] Yes 300 mg every 28 (twenty-eight) days. [provider] Taking Active Self  Accu-Chek FastClix Lancets MISC [id number] Yes Use to test blood sugar three times daily. Dx: E11.9 Ngetich, Dinah C, NP Taking Active   ADDERALL XR 5 MG 24 hr capsule [id number] Yes Take 5 mg by mouth daily. [provider] Taking Active Self  amLODipine (NORVASC) 10 MG tablet [id number] Yes Take 1 tablet (10 mg total) by mouth daily. Ngetich, Donalee Citrininah C, NP Taking Active   Ascorbic Acid (VITAMIN C) 1000 MG tablet [id number] Yes Take 1,000 mg by mouth 3 (three) times a week. [provider] Taking Active Self  ASTAXANTHIN PO [id number] Yes Take 12 mg by mouth daily as needed (high blood sugar). [provider] Taking Active Self  Barberry-Oreg Grape-Goldenseal (BERBERINE COMPLEX PO) [id number] Yes Take 2 capsules by mouth daily as needed (high blood sugar). [provider] Taking  Active Self  blood glucose meter kit and supplies [id number] Yes Dispense based on patient and insurance preference. Use up to three times daily. (FOR ICD-10 E10.9, E11.9). Ngetich, Dinah C, NP Taking Active   clonazePAM (KLONOPIN) 0.5 MG tablet [id number] Yes Take 1 mg by mouth at bedtime. [provider] Taking Active Self  gabapentin (NEURONTIN) 300 MG capsule [id number] Yes TAKE 1 CAPSULE BY MOUTH TWO TIMES DAILY Ngetich, Dinah C, NP Taking Active   lamoTRIgine (LAMICTAL) 200 MG tablet [id number] Yes Take 200 mg by mouth 2 (two) times daily. Takes Brand Name ONLY [provider] Taking Active Self           Med Note Cyndie Chime, [facility] I   Fri Sep 06, 2021  4:12 PM) Doran Stabler NAME ONLY: LAMICTAL  loratadine (CLARITIN) 10 MG tablet [id number] Yes TAKE 1 TABLET(10 MG) BY MOUTH DAILY  Patient taking differently: Take 10 mg by mouth as needed.   Ngetich, Dinah C, NP Taking Active   metFORMIN (GLUCOPHAGE) 500 MG tablet [id number]  Take 1 tablet (500 mg total) by mouth 2 (two) times daily with a meal. Ngetich, Dinah C, NP  Expired 06/18/22 2359   nystatin cream (MYCOSTATIN) [id number] Yes Apply 1 Application topically 2 (two) times daily.  Patient taking differently: Apply 1 Application topically 2 (two) times daily as needed.   Ngetich, Donalee Citrin, NP Taking Active   ondansetron (ZOFRAN) 4 MG tablet [id number] Yes Take 4 mg by mouth 2 (two) times daily as needed for vomiting or nausea. [provider] Taking Active Self  sertraline (ZOLOFT) 25 MG tablet [id number] Yes Take 25 mg by mouth at bedtime. [provider] Taking Active Self  sitaGLIPtin (JANUVIA) 25 MG tablet [id number] Yes Take 1 tablet (25 mg total) by mouth daily. Ngetich, Donalee Citrin, NP Taking Active   traMADol (ULTRAM) 50 MG tablet [id number] Yes 1-2 tabs PO q6 hours prn pain  Patient taking differently: Take 50 mg by mouth as needed. 1-2 tabs PO q6 hours prn pain   Betha Loa, MD Taking Active   traZODone (DESYREL) 50 MG tablet [id number] Yes Take 25 mg by mouth at bedtime. [provider] Taking Active Self              Assessment/Plan:   Diabetes: - Currently controlled. Patient reports she filled metformin once in [place] which may have resulted in gap for med adherence  reporting.  - Recommend to continue current regimen  - Recommend to check glucose per usual pattern  Hypertension: - Currently controlled - Reviewed long term cardiovascular and renal outcomes of uncontrolled blood pressure - Reviewed appropriate blood pressure monitoring technique and reviewed goal blood pressure. Recommended to check home blood pressure and heart rate 1-2x weekly - Recommend to continue current regimen, patient states her BP cuff is not working well and she is price-shopping to obtain the specific omron brand BP cuff.   Hyperlipidemia/ASCVD Risk Reduction: - Currently uncontrolled.  - Will collaborate with provider to consider G72 statin exclusion code to satisfy quality metric reporting. - Reviewed long term complications of uncontrolled cholesterol - Recommend to consider PCSK9 inhibitor, counseled patient on this medication, answered questions and concerns. Patient declines at this time.  - Patient may be eligible for healthwell foundation to cover the remaining copay for her if prescribed PCSK9 inihibitor such as repatha. However, patient is on Medicare Extra Help program which would be $11 maximum for cost. Of note, patient is moving out of state and this would  also disqualify from the healthwell foundation grant at that time.   Lynnda ShieldsKeesha Dede Dobesh, PharmD, BCPS Clinical Pharmacist [facility]

## 2022-07-08 ENCOUNTER — Other Ambulatory Visit: Payer: Self-pay | Admitting: *Deleted

## 2022-07-08 DIAGNOSIS — E1169 Type 2 diabetes mellitus with other specified complication: Secondary | ICD-10-CM

## 2022-07-08 MED ORDER — SITAGLIPTIN PHOSPHATE 25 MG PO TABS
25.0000 mg | ORAL_TABLET | Freq: Every day | ORAL | 1 refills | Status: DC
Start: 2022-07-08 — End: 2023-02-13

## 2022-07-08 NOTE — Telephone Encounter (Signed)
Patient requested a 90 day supply of Januvia to be sent to [facility].

## 2022-07-11 ENCOUNTER — Other Ambulatory Visit: Payer: Self-pay | Admitting: Family

## 2022-07-11 DIAGNOSIS — G729 Myopathy, unspecified: Secondary | ICD-10-CM | POA: Diagnosis not present

## 2022-07-11 DIAGNOSIS — I10 Essential (primary) hypertension: Secondary | ICD-10-CM | POA: Diagnosis not present

## 2022-07-11 DIAGNOSIS — M5020 Other cervical disc displacement, unspecified cervical region: Secondary | ICD-10-CM | POA: Diagnosis not present

## 2022-07-11 DIAGNOSIS — E119 Type 2 diabetes mellitus without complications: Secondary | ICD-10-CM | POA: Diagnosis not present

## 2022-07-11 DIAGNOSIS — E1169 Type 2 diabetes mellitus with other specified complication: Secondary | ICD-10-CM

## 2022-07-11 DIAGNOSIS — G43909 Migraine, unspecified, not intractable, without status migrainosus: Secondary | ICD-10-CM | POA: Diagnosis not present

## 2022-07-11 DIAGNOSIS — E785 Hyperlipidemia, unspecified: Secondary | ICD-10-CM | POA: Diagnosis not present

## 2022-07-11 DIAGNOSIS — F419 Anxiety disorder, unspecified: Secondary | ICD-10-CM | POA: Diagnosis not present

## 2022-07-11 DIAGNOSIS — F310 Bipolar disorder, current episode hypomanic: Secondary | ICD-10-CM | POA: Diagnosis not present

## 2022-07-11 DIAGNOSIS — F4310 Post-traumatic stress disorder, unspecified: Secondary | ICD-10-CM | POA: Diagnosis not present

## 2022-07-22 DIAGNOSIS — F418 Other specified anxiety disorders: Secondary | ICD-10-CM | POA: Diagnosis not present

## 2022-07-22 DIAGNOSIS — F900 Attention-deficit hyperactivity disorder, predominantly inattentive type: Secondary | ICD-10-CM | POA: Diagnosis not present

## 2022-07-22 DIAGNOSIS — F3181 Bipolar II disorder: Secondary | ICD-10-CM | POA: Diagnosis not present

## 2022-07-24 DIAGNOSIS — H26491 Other secondary cataract, right eye: Secondary | ICD-10-CM | POA: Diagnosis not present

## 2022-07-24 LAB — HM DIABETES EYE EXAM

## 2022-08-04 DIAGNOSIS — G729 Myopathy, unspecified: Secondary | ICD-10-CM | POA: Diagnosis not present

## 2022-08-04 DIAGNOSIS — I10 Essential (primary) hypertension: Secondary | ICD-10-CM | POA: Diagnosis not present

## 2022-08-04 DIAGNOSIS — E119 Type 2 diabetes mellitus without complications: Secondary | ICD-10-CM | POA: Diagnosis not present

## 2022-08-04 DIAGNOSIS — E785 Hyperlipidemia, unspecified: Secondary | ICD-10-CM | POA: Diagnosis not present

## 2022-08-04 DIAGNOSIS — F310 Bipolar disorder, current episode hypomanic: Secondary | ICD-10-CM | POA: Diagnosis not present

## 2022-08-04 DIAGNOSIS — M5020 Other cervical disc displacement, unspecified cervical region: Secondary | ICD-10-CM | POA: Diagnosis not present

## 2022-08-04 DIAGNOSIS — G43909 Migraine, unspecified, not intractable, without status migrainosus: Secondary | ICD-10-CM | POA: Diagnosis not present

## 2022-08-04 DIAGNOSIS — F419 Anxiety disorder, unspecified: Secondary | ICD-10-CM | POA: Diagnosis not present

## 2022-08-04 DIAGNOSIS — F4310 Post-traumatic stress disorder, unspecified: Secondary | ICD-10-CM | POA: Diagnosis not present

## 2022-08-18 ENCOUNTER — Encounter: Payer: Self-pay | Admitting: Family

## 2022-08-18 ENCOUNTER — Ambulatory Visit (INDEPENDENT_AMBULATORY_CARE_PROVIDER_SITE_OTHER): Payer: Medicare HMO | Admitting: Family

## 2022-08-18 DIAGNOSIS — E1159 Type 2 diabetes mellitus with other circulatory complications: Secondary | ICD-10-CM

## 2022-08-18 MED ORDER — ACCU-CHEK FASTCLIX LANCETS MISC
11 refills | Status: AC
Start: 2022-08-18 — End: ?

## 2022-08-18 MED ORDER — LOSARTAN POTASSIUM 25 MG PO TABS
25.0000 mg | ORAL_TABLET | Freq: Every day | ORAL | 1 refills | Status: DC
Start: 2022-08-18 — End: 2022-10-28

## 2022-08-18 MED ORDER — GLUCOSE BLOOD VI STRP
1.0000 | ORAL_STRIP | 5 refills | Status: AC | PRN
Start: 2022-08-18 — End: ?

## 2022-08-18 NOTE — Progress Notes (Signed)
Provider: Richarda Blade FNP-C   Alaylah Heatherington, Donalee Citrin, NP  Patient Care Team: Takoya Jonas, Donalee Citrin, NP as PCP - General (Family Medicine) Olivia Mackie, MD as Consulting Physician (Obstetrics and Gynecology) Antony Contras, MD as Consulting Physician (Ophthalmology)  Extended Emergency Contact Information Primary Emergency Contact: Clunn,Christopher          [place], [place] Home Phone: [phone] Relation: Son  Code Status:  Full Code  Goals of care: Advanced Directive information    10/25/2021   11:06 AM  Advanced Directives  Does Patient Have a Medical Advance Directive? Yes  Type of Estate agent of [place];Living will  Does patient want to make changes to medical advance directive? No - Patient declined  Copy of Healthcare Power of Attorney in Chart? Yes - validated most recent copy scanned in chart (See row information)  Would patient like information on creating a medical advance directive? No - Patient declined     Chief Complaint  Patient presents with   Medical Management of Chronic Issues    Patient presents today for a 2 month follow-up   Quality Metric Gaps    Urine microalbumin, mammogram, foot & eye exam, COVID#3    HPI:  Pt is a 72 y.o. female seen today for 2 months follow up for medical management of chronic diseases.   Type 2 DM - CBG runs in the 120's -130's and after meals runs in the 200's.    Due for annual foot exam Also due for mammogram.will postpone until seen in [place].   Past Medical History:  Diagnosis Date   Anemia 02/05/2021   Anxiety    Bipolar 1 disorder (HCC)    Bulging of cervical intervertebral disc    C2   Bulging of thoracic intervertebral disc    t4   Depression    Diabetes mellitus without complication (HCC)    GI bleed 02/05/2021   Hyperlipidemia    Migraine    in hospital 09/06/21--ruled out for stroke   PTSD (post-traumatic stress disorder)    Sad    Past  Surgical History:  Procedure Laterality Date   CATARACT EXTRACTION Right 08/11/2017   CATARACT EXTRACTION Left 09/01/2017   EXCISION METACARPAL MASS Left 10/07/2021   Procedure: EXCISION MASS LEFT LONG FINGER;  Surgeon: Betha Loa, MD;  Location: [facility];  Service: Orthopedics;  Laterality: Left;  45 MIN   EYE SURGERY     HEMICOLECTOMY  1997   JOINT REPLACEMENT  2013   hip    Allergies  Allergen Reactions   Aspirin Other (See Comments)    History of GI bleed   Nsaids     History of GI bleed   Oxycodone     Migraines   Statins Other (See Comments)    "makes my urine turn brown" Other reaction(s): MUSCLE PAIN "makes my urine turn brown"    Allergies as of 08/18/2022       Reactions   Aspirin Other (See Comments)   History of GI bleed   Nsaids    History of GI bleed   Oxycodone    Migraines   Statins Other (See Comments)   "makes my urine turn brown" Other reaction(s): MUSCLE PAIN "makes my urine turn brown"        Medication List        Accurate as of Aug 18, 2022 11:59 AM. If you have any questions, ask your nurse or doctor.  Abilify Asimtufii 720 MG/2.4ML Prsy Generic drug: ARIPiprazole ER Every 60 days What changed: Another medication with the same name was removed. Continue taking this medication, and follow the directions you see here. Changed by: Caesar Bookman, NP   Accu-Chek FastClix Lancets Misc Use to test blood sugar three times daily. Dx: E11.9   Adderall XR 5 MG 24 hr capsule Generic drug: amphetamine-dextroamphetamine Take 5 mg by mouth daily.   amLODipine 10 MG tablet Commonly known as: NORVASC Take 1 tablet (10 mg total) by mouth daily.   ASTAXANTHIN PO Take 12 mg by mouth daily as needed (high blood sugar).   BERBERINE COMPLEX PO Take 2 capsules by mouth daily as needed (high blood sugar).   blood glucose meter kit and supplies Dispense based on patient and insurance preference. Use up to three  times daily. (FOR ICD-10 E10.9, E11.9).   Blood Pressure Cuff Misc 1 Device by Does not apply route daily.   clonazePAM 0.5 MG tablet Commonly known as: KLONOPIN Take 1 mg by mouth at bedtime.   gabapentin 300 MG capsule Commonly known as: NEURONTIN TAKE 1 CAPSULE BY MOUTH TWO TIMES DAILY   lamoTRIgine 200 MG tablet Commonly known as: LAMICTAL Take 200 mg by mouth 2 (two) times daily. Takes Brand Name ONLY   loratadine 10 MG tablet Commonly known as: CLARITIN TAKE 1 TABLET(10 MG) BY MOUTH DAILY What changed: See the new instructions.   metFORMIN 500 MG tablet Commonly known as: GLUCOPHAGE TAKE 1 TABLET(500 MG) BY MOUTH TWICE DAILY WITH A MEAL   nystatin cream Commonly known as: MYCOSTATIN Apply 1 Application topically 2 (two) times daily. What changed:  when to take this reasons to take this   ondansetron 4 MG tablet Commonly known as: ZOFRAN Take 4 mg by mouth 2 (two) times daily as needed for vomiting or nausea.   sertraline 25 MG tablet Commonly known as: ZOLOFT Take 25 mg by mouth at bedtime.   sitaGLIPtin 25 MG tablet Commonly known as: Januvia Take 1 tablet (25 mg total) by mouth daily.   traZODone 50 MG tablet Commonly known as: DESYREL Take 25 mg by mouth at bedtime.   vitamin C 1000 MG tablet Take 1,000 mg by mouth 3 (three) times a week.        Review of Systems  Constitutional:  Negative for appetite change, chills, fatigue, fever and unexpected weight change.  HENT:  Negative for congestion, dental problem, ear discharge, ear pain, facial swelling, hearing loss, nosebleeds, postnasal drip, rhinorrhea, sinus pressure, sinus pain, sneezing, sore throat, tinnitus and trouble swallowing.   Eyes:  Negative for pain, discharge, redness, itching and visual disturbance.  Respiratory:  Negative for cough, chest tightness, shortness of breath and wheezing.   Cardiovascular:  Negative for chest pain, palpitations and leg swelling.  Gastrointestinal:   Negative for abdominal distention, abdominal pain, blood in stool, constipation, diarrhea, nausea and vomiting.  Endocrine: Negative for cold intolerance, heat intolerance, polydipsia, polyphagia and polyuria.  Genitourinary:  Negative for difficulty urinating, dysuria, flank pain, frequency and urgency.  Musculoskeletal:  Negative for arthralgias, back pain, gait problem, joint swelling, myalgias, neck pain and neck stiffness.  Skin:  Negative for color change, pallor, rash and wound.  Neurological:  Negative for dizziness, syncope, speech difficulty, weakness, light-headedness, numbness and headaches.  Hematological:  Does not bruise/bleed easily.  Psychiatric/Behavioral:  Negative for agitation, behavioral problems, confusion, hallucinations, self-injury, sleep disturbance and suicidal ideas. The patient is not nervous/anxious.     Immunization History  Administered Date(s) Administered   Fluad Quad(high Dose 65+) 12/21/2018, 12/02/2019, 11/23/2020, 04/09/2022   Influenza,inj,Quad PF,6+ Mos 04/04/2016   Influenza-Unspecified 12/29/2013, 12/14/2017   PFIZER(Purple Top)SARS-COV-2 Vaccination 08/18/2019, 09/12/2019   Pneumococcal Conjugate-13 01/04/2016   Pneumococcal Polysaccharide-23 09/30/2017   Tdap 09/29/2015   Zoster Recombinat (Shingrix) 12/05/2017, 06/03/2018   Pertinent  Health Maintenance Due  Topic Date Due   FOOT EXAM  05/28/2021   MAMMOGRAM  11/06/2021   OPHTHALMOLOGY EXAM  04/08/2022   INFLUENZA VACCINE  10/30/2022   HEMOGLOBIN A1C  11/11/2022   COLONOSCOPY (Pts 45-27yrs Insurance coverage will need to be confirmed)  02/08/2031   DEXA SCAN  Completed      09/18/2021   10:12 AM 10/07/2021   12:50 PM 10/25/2021   11:02 AM 04/09/2022   11:04 AM 06/06/2022   11:00 AM  Fall Risk  Falls in the past year? 1  1 0 1  Was there an injury with Fall? 0  0 0 0  Fall Risk Category Calculator 1  2 0 2  Fall Risk Category (Retired) Low  Moderate Low   (RETIRED) Patient Fall Risk  Level Low fall risk High fall risk Moderate fall risk Low fall risk   Patient at Risk for Falls Due to History of fall(s)  Impaired balance/gait;Impaired mobility;History of fall(s) No Fall Risks History of fall(s)  Fall risk Follow up Falls evaluation completed  Falls evaluation completed;Education provided;Falls prevention discussed Falls evaluation completed Falls evaluation completed   Functional Status Survey:    Vitals:   08/18/22 1100  BP: 122/82  Pulse: (!) 102  Temp: 98.7 F (37.1 C)  SpO2: 95%  Weight: 189 lb 14.4 oz (86.1 kg)  Height: 5\' 3"  (1.6 m)   Body mass index is 33.64 kg/m. Physical Exam Vitals reviewed.  Constitutional:      General: She is not in acute distress.    Appearance: Normal appearance. She is obese. She is not ill-appearing or diaphoretic.  HENT:     Head: Normocephalic.     Right Ear: Tympanic membrane, ear canal and external ear normal. There is no impacted cerumen.     Left Ear: Tympanic membrane, ear canal and external ear normal. There is no impacted cerumen.     Nose: Nose normal. No congestion or rhinorrhea.     Mouth/Throat:     Mouth: Mucous membranes are moist.     Pharynx: Oropharynx is clear. No oropharyngeal exudate or posterior oropharyngeal erythema.  Eyes:     General: No scleral icterus.       Right eye: No discharge.        Left eye: No discharge.     Extraocular Movements: Extraocular movements intact.     Conjunctiva/sclera: Conjunctivae normal.     Pupils: Pupils are equal, round, and reactive to light.  Neck:     Vascular: No carotid bruit.  Cardiovascular:     Rate and Rhythm: Normal rate and regular rhythm.     Pulses: Normal pulses.     Heart sounds: Normal heart sounds. No murmur heard.    No friction rub. No gallop.  Pulmonary:     Effort: Pulmonary effort is normal. No respiratory distress.     Breath sounds: Normal breath sounds. No wheezing, rhonchi or rales.  Chest:     Chest wall: No tenderness.   Abdominal:     General: Bowel sounds are normal. There is no distension.     Palpations: Abdomen is soft. There is no mass.  Tenderness: There is no abdominal tenderness. There is no right CVA tenderness, left CVA tenderness, guarding or rebound.  Musculoskeletal:        General: No swelling or tenderness. Normal range of motion.     Cervical back: Normal range of motion. No rigidity or tenderness.     Right lower leg: No edema.     Left lower leg: No edema.  Lymphadenopathy:     Cervical: No cervical adenopathy.  Skin:    General: Skin is warm and dry.     Coloration: Skin is not pale.     Findings: No bruising, erythema, lesion or rash.  Neurological:     Mental Status: She is alert and oriented to person, place, and time.     Cranial Nerves: No cranial nerve deficit.     Sensory: No sensory deficit.     Motor: No weakness.     Coordination: Coordination normal.     Gait: Gait normal.  Psychiatric:        Mood and Affect: Mood normal.        Speech: Speech normal.        Behavior: Behavior normal.        Thought Content: Thought content normal.        Judgment: Judgment normal.     Labs reviewed: Recent Labs    09/06/21 1207 09/06/21 2003 05/13/22 0852  NA 136  --  136  K 3.7  --  4.6  CL 108  --  101  CO2 22  --  25  GLUCOSE 106*  --  140*  BUN 21  --  25  CREATININE 0.91  --  0.99  CALCIUM 7.6*  --  9.3  MG  --  2.4  --    Recent Labs    09/06/21 1207 05/13/22 0852  AST 14* 15  ALT 12 16  ALKPHOS 56  --   BILITOT 0.6 0.3  PROT 5.7* 6.6  ALBUMIN 3.6  --    Recent Labs    09/06/21 1207 05/13/22 0852  WBC 4.1 4.1  NEUTROABS 3.1 2,817  HGB 12.3 12.8  HCT 37.0 37.7  MCV 89.2 87.1  PLT 186 211   Lab Results  Component Value Date   TSH 2.33 05/13/2022   Lab Results  Component Value Date   HGBA1C 6.8 (H) 05/13/2022   Lab Results  Component Value Date   CHOL 245 (H) 05/13/2022   HDL 58 05/13/2022   LDLCALC 149 (H) 05/13/2022   TRIG 237  (H) 05/13/2022   CHOLHDL 4.2 05/13/2022    Significant Diagnostic Results in last 30 days:  No results found.  Assessment/Plan  Type 2 diabetes mellitus with other circulatory complication, without long-term current use of insulin (HCC) Lab Results  Component Value Date   HGBA1C 6.8 (H) 05/13/2022  - continue on sitagliptin and metformin  - Accu-Chek FastClix Lancets MISC; Use to test blood sugar three times daily. Dx: E11.9  Dispense: 306 each; Refill: 11 - glucose blood test strip; 1 each by Other route as needed for other. Use as instructed  Dispense: 100 each; Refill: 5 - losartan (COZAAR) 25 MG tablet; Take 1 tablet (25 mg total) by mouth daily.  Dispense: 90 tablet; Refill: 1 - Microalbumin / creatinine urine ratio  Family/ staff Communication: Reviewed plan of care with patient verbalized understanding.  Labs/tests ordered:  - Microalbumin / creatinine urine ratio  Next Appointment : Return in about 6 months (around 02/18/2023) for medical  mangement of chronic issues.Caesar Bookman, NP

## 2022-08-19 LAB — MICROALBUMIN / CREATININE URINE RATIO
Creatinine, Urine: 149 mg/dL (ref 20–275)
Microalb Creat Ratio: 19 mg/g creat (ref <30)
Microalb, Ur: 2.8 mg/dL

## 2022-08-20 DIAGNOSIS — F418 Other specified anxiety disorders: Secondary | ICD-10-CM | POA: Diagnosis not present

## 2022-08-20 DIAGNOSIS — F900 Attention-deficit hyperactivity disorder, predominantly inattentive type: Secondary | ICD-10-CM | POA: Diagnosis not present

## 2022-08-20 DIAGNOSIS — F3181 Bipolar II disorder: Secondary | ICD-10-CM | POA: Diagnosis not present

## 2022-08-29 ENCOUNTER — Telehealth: Payer: Self-pay

## 2022-08-29 DIAGNOSIS — B372 Candidiasis of skin and nail: Secondary | ICD-10-CM

## 2022-08-29 MED ORDER — NYSTATIN 100000 UNIT/GM EX CREA
1.0000 | TOPICAL_CREAM | Freq: Two times a day (BID) | CUTANEOUS | 2 refills | Status: AC | PRN
Start: 2022-08-29 — End: ?

## 2022-08-29 NOTE — Telephone Encounter (Signed)
Nystatin ordered

## 2022-08-29 NOTE — Telephone Encounter (Signed)
Patient called and states that she misplaced Nystatin Cream and she's broken out into rash again. She wanted to know if another prescription can be sent in for her. Message routed to PCP Ngetich, Donalee Citrin, NP

## 2022-09-04 DIAGNOSIS — F909 Attention-deficit hyperactivity disorder, unspecified type: Secondary | ICD-10-CM | POA: Diagnosis not present

## 2022-09-04 DIAGNOSIS — F4310 Post-traumatic stress disorder, unspecified: Secondary | ICD-10-CM | POA: Diagnosis not present

## 2022-09-16 ENCOUNTER — Encounter: Payer: Self-pay | Admitting: Family

## 2022-09-22 DIAGNOSIS — F4310 Post-traumatic stress disorder, unspecified: Secondary | ICD-10-CM | POA: Diagnosis not present

## 2022-09-22 DIAGNOSIS — F909 Attention-deficit hyperactivity disorder, unspecified type: Secondary | ICD-10-CM | POA: Diagnosis not present

## 2022-09-23 ENCOUNTER — Other Ambulatory Visit: Payer: Self-pay

## 2022-09-23 DIAGNOSIS — G894 Chronic pain syndrome: Secondary | ICD-10-CM

## 2022-09-23 MED ORDER — GABAPENTIN 300 MG PO CAPS
ORAL_CAPSULE | ORAL | 0 refills | Status: DC
Start: 2022-09-23 — End: 2022-12-03

## 2022-10-20 DIAGNOSIS — F909 Attention-deficit hyperactivity disorder, unspecified type: Secondary | ICD-10-CM | POA: Diagnosis not present

## 2022-10-20 DIAGNOSIS — F4310 Post-traumatic stress disorder, unspecified: Secondary | ICD-10-CM | POA: Diagnosis not present

## 2022-10-21 ENCOUNTER — Other Ambulatory Visit: Payer: Self-pay | Admitting: Family

## 2022-10-21 DIAGNOSIS — E1159 Type 2 diabetes mellitus with other circulatory complications: Secondary | ICD-10-CM

## 2022-10-27 ENCOUNTER — Encounter: Payer: Medicare HMO | Admitting: Family

## 2022-10-28 ENCOUNTER — Ambulatory Visit (INDEPENDENT_AMBULATORY_CARE_PROVIDER_SITE_OTHER): Payer: Medicare HMO | Admitting: Family

## 2022-10-28 ENCOUNTER — Encounter: Payer: Self-pay | Admitting: Family

## 2022-10-28 VITALS — Ht 63.0 in | Wt 185.0 lb

## 2022-10-28 DIAGNOSIS — Z0000 Encounter for general adult medical examination without abnormal findings: Secondary | ICD-10-CM | POA: Diagnosis not present

## 2022-10-28 DIAGNOSIS — E1159 Type 2 diabetes mellitus with other circulatory complications: Secondary | ICD-10-CM | POA: Diagnosis not present

## 2022-10-28 MED ORDER — LOSARTAN POTASSIUM 50 MG PO TABS
50.0000 mg | ORAL_TABLET | Freq: Every day | ORAL | 1 refills | Status: AC
Start: 2022-10-28 — End: ?

## 2022-10-28 NOTE — Progress Notes (Signed)
This service is provided via telemedicine  No vital signs collected/recorded due to the encounter was a telemedicine visit.   Location of patient (ex: home, work):  home  Patient consents to a telephone visit:  yes   Location of the provider (ex: office, home):  [facility]  Names of all persons participating in the telemedicine service and their role in the encounter:  Linaya Hession, Guss Bunde [facility], Richarda Blade, NP  Time spent on call:  15 minutes     Subjective:   Kaitlyn Wood is a 72 y.o. female who presents for Medicare Annual (Subsequent) preventive examination.  Visit Complete: Virtual  I connected with  Kaitlyn Wood on 11/02/22 by a video and audio enabled telemedicine application and verified that I am speaking with the correct person using two identifiers.  Patient Location: Home  Provider Location: Office/Clinic  I discussed the limitations of evaluation and management by telemedicine. The patient expressed understanding and agreed to proceed.  Patient Medicare AWV questionnaire was completed by the patient on 10/28/2022 ; I have confirmed that all information answered by patient is correct and no changes since this date.  Review of Systems     Cardiac Risk Factors include: advanced age (>16men, >49 women);diabetes mellitus;hypertension;obesity (BMI >30kg/m2)     Objective:    Today's Vitals   10/28/22 0830  Weight: 185 lb (83.9 kg)  Height: 5\' 3"  (1.6 m)   Body mass index is 32.77 kg/m.     10/28/2022    8:32 AM 10/25/2021   11:06 AM 10/07/2021   12:52 PM 09/20/2021   12:51 PM 09/07/2021    5:00 AM 12/05/2020    2:07 PM 11/23/2020   11:38 AM  Advanced Directives  Does Patient Have a Medical Advance Directive? Yes Yes No Yes No Yes Yes  Type of Estate agent of [place];Living will Healthcare Power of [place];Living will    Healthcare Power of [place];Living will Healthcare Power of  [place];Living will  Does patient want to make changes to medical advance directive? No - Patient declined No - Patient declined    No - Patient declined No - Patient declined  Copy of Healthcare Power of Attorney in Chart? Yes - validated most recent copy scanned in chart (See row information) Yes - validated most recent copy scanned in chart (See row information)    Yes - validated most recent copy scanned in chart (See row information) Yes - validated most recent copy scanned in chart (See row information)  Would patient like information on creating a medical advance directive?  No - Patient declined No - Patient declined  No - Patient declined      Current Medications (verified) Outpatient Encounter Medications as of 10/28/2022  Medication Sig   ABILIFY ASIMTUFII 720 MG/2.4ML PRSY Every 60 days   Accu-Chek FastClix Lancets MISC Use to test blood sugar three times daily. Dx: E11.9   amLODipine (NORVASC) 10 MG tablet Take 1 tablet (10 mg total) by mouth daily.   Ascorbic Acid (VITAMIN C) 1000 MG tablet Take 1,000 mg by mouth 3 (three) times a week.   ASTAXANTHIN PO Take 12 mg by mouth daily as needed (high blood sugar).   atomoxetine (STRATTERA) 18 MG capsule Take 18 mg by mouth daily.   Barberry-Oreg Grape-Goldenseal (BERBERINE COMPLEX PO) Take 2 capsules by mouth daily as needed (high blood sugar).   blood glucose meter kit and supplies Dispense based on patient and insurance preference. Use up to  three times daily. (FOR ICD-10 E10.9, E11.9).   Blood Pressure Monitoring (BLOOD PRESSURE CUFF) MISC 1 Device by Does not apply route daily.   clonazePAM (KLONOPIN) 0.5 MG tablet Take 1 mg by mouth at bedtime.   gabapentin (NEURONTIN) 300 MG capsule TAKE 1 CAPSULE BY MOUTH TWO TIMES DAILY   glucose blood test strip 1 each by Other route as needed for other. Use as instructed   lamoTRIgine (LAMICTAL) 200 MG tablet Take 200 mg by mouth 2 (two) times daily. Takes Brand Name ONLY   loratadine  (CLARITIN) 10 MG tablet TAKE 1 TABLET(10 MG) BY MOUTH DAILY (Patient taking differently: Take 10 mg by mouth as needed.)   losartan (COZAAR) 25 MG tablet Take 1 tablet (25 mg total) by mouth daily.   metFORMIN (GLUCOPHAGE) 500 MG tablet TAKE 1 TABLET(500 MG) BY MOUTH TWICE DAILY WITH A MEAL   nystatin cream (MYCOSTATIN) Apply 1 Application topically 2 (two) times daily as needed.   ondansetron (ZOFRAN) 4 MG tablet Take 4 mg by mouth 2 (two) times daily as needed for vomiting or nausea.   sertraline (ZOLOFT) 25 MG tablet Take 25 mg by mouth at bedtime.   sitaGLIPtin (JANUVIA) 25 MG tablet Take 1 tablet (25 mg total) by mouth daily.   traZODone (DESYREL) 50 MG tablet Take 25 mg by mouth at bedtime.   [DISCONTINUED] ADDERALL XR 5 MG 24 hr capsule Take 5 mg by mouth daily.   No facility-administered encounter medications on file as of 10/28/2022.    Allergies (verified) Aspirin, Nsaids, Oxycodone, and Statins   History: Past Medical History:  Diagnosis Date   Anemia 02/05/2021   Anxiety    Bipolar 1 disorder (HCC)    Bulging of cervical intervertebral disc    C2   Bulging of thoracic intervertebral disc    t4   Depression    Diabetes mellitus without complication (HCC)    GI bleed 02/05/2021   Hyperlipidemia    Migraine    in hospital 09/06/21--ruled out for stroke   PTSD (post-traumatic stress disorder)    Sad    Past Surgical History:  Procedure Laterality Date   CATARACT EXTRACTION Right 08/11/2017   CATARACT EXTRACTION Left 09/01/2017   EXCISION METACARPAL MASS Left 10/07/2021   Procedure: EXCISION MASS LEFT LONG FINGER;  Surgeon: Betha Loa, MD;  Location: [facility];  Service: Orthopedics;  Laterality: Left;  45 MIN   EYE SURGERY     HEMICOLECTOMY  1997   JOINT REPLACEMENT  2013   hip   Family History  Problem Relation Age of Onset   Ovarian cancer Mother    Lymphoma Father    Colon cancer Sister    Social History   Socioeconomic History   Marital  status: Divorced    Spouse name: Not on file   Number of children: Not on file   Years of education: Not on file   Highest education level: Master's degree (e.g., MA, MS, MEng, MEd, MSW, MBA)  Occupational History   Not on file  Tobacco Use   Smoking status: Never   Smokeless tobacco: Never  Vaping Use   Vaping status: Never Used  Substance and Sexual Activity   Alcohol use: No   Drug use: No   Sexual activity: Not Currently  Other Topics Concern   Not on file  Social History Narrative   Diet? ? Fresh food- very little prepared food- no fast food.       Do you drink/eat things  with caffeine?  Yes      Marital status?         Divorced                           What year were you married? 1978      Do you live in a house, apartment, assisted living, condo, trailer, etc.? apartment      Is it one or more stories? Yes      How many persons live in your home? 1      Do you have any pets in your home? (please list)  1 cat, Polly      Current or past profession: Management consultant, sales       Do you exercise?       yes                               Type & how often? 2-3 times a week      Do you have a living will? No      Do you have a DNR form?    no                              If not, do you want to discuss one? yes      Do you have signed POA/HPOA for forms? no   Social Determinants of Health   Financial Resource Strain: Low Risk  (09/30/2017)   Overall Financial Resource Strain (CARDIA)    Difficulty of Paying Living Expenses: Not hard at all  Food Insecurity: No Food Insecurity (08/17/2022)   Hunger Vital Sign    Worried About Running Out of Food in the Last Year: Never true    Ran Out of Food in the Last Year: Never true  Transportation Needs: Unmet Transportation Needs (08/17/2022)   PRAPARE - Administrator, Civil Service (Medical): Yes    Lack of Transportation (Non-Medical): No  Physical Activity: Insufficiently Active (08/17/2022)   Exercise  Vital Sign    Days of Exercise per Week: 2 days    Minutes of Exercise per Session: 30 min  Stress: Stress Concern Present (08/17/2022)   [employer] of Occupational Health - Occupational Stress Questionnaire    Feeling of Stress : To some extent  Social Connections: Moderately Integrated (08/17/2022)   Social Connection and Isolation Panel [NHANES]    Frequency of Communication with Friends and Family: Twice a week    Frequency of Social Gatherings with Friends and Family: Once a week    Attends Religious Services: More than 4 times per year    Active Member of [employer] or Organizations: Yes    Attends Engineer, structural: More than 4 times per year    Marital Status: Divorced    Tobacco Counseling Counseling given: Not Answered   Clinical Intake:  Pre-visit preparation completed: No  Pain : No/denies pain     BMI - recorded: 33.64 Nutritional Status: BMI > 30  Obese Nutritional Risks: None Diabetes: Yes CBG done?: Yes (135) CBG resulted in Enter/ Edit results?: No Did pt. bring in CBG monitor from home?: Yes (110's - 150's) Glucose Meter Downloaded?: No  How often do you need to have someone help you when you read instructions, pamphlets, or other written materials from your doctor or pharmacy?: 1 - Never  What is the last grade level you completed in school?: Master's Degree 17 yrs  Interpreter Needed?: No      Activities of Daily Living    10/28/2022   10:45 AM  In your present state of health, do you have any difficulty performing the following activities:  Hearing? 0  Vision? 0  Difficulty concentrating or making decisions? 0  Walking or climbing stairs? 0  Dressing or bathing? 0  Doing errands, shopping? 0  Preparing Food and eating ? N  Using the Toilet? N  In the past six months, have you accidently leaked urine? Y  Do you have problems with loss of bowel control? N  Managing your Medications? N  Managing your Finances? N  Housekeeping  or managing your Housekeeping? N    Patient Care Team: Tatyana Biber, Donalee Citrin, NP as PCP - General (Family Medicine) Olivia Mackie, MD as Consulting Physician (Obstetrics and Gynecology) Antony Contras, MD as Consulting Physician (Ophthalmology)  Indicate any recent Medical Services you may have received from other than [facility] providers in the past year (date may be approximate).     Assessment:   This is a routine wellness examination for Kaitlyn Wood.  Hearing/Vision screen No results found.  Dietary issues and exercise activities discussed:     Goals Addressed             This Visit's Progress    Exercise 3x per week (30 min per time)         Depression Screen    10/28/2022    8:33 AM 10/25/2021   11:02 AM 10/23/2020    1:07 PM 10/18/2019   10:27 AM 04/25/2019    1:10 PM 10/06/2018    1:16 PM 02/18/2018    9:52 AM  PHQ 2/9 Scores  PHQ - 2 Score 0 0 0 0 0  0  PHQ- 9 Score 1        Exception Documentation Other- indicate reason in comment box  Other- indicate reason in comment box   Other- indicate reason in comment box   Not completed annual wellness visit     Under the care of specialist     Fall Risk    10/28/2022    9:08 AM 10/28/2022    8:33 AM 06/06/2022   11:00 AM 04/09/2022   11:04 AM 10/25/2021   11:02 AM  Fall Risk   Falls in the past year? 1 0 1 0 1  Number falls in past yr: 0 0 1 0 1  Injury with Fall? 0 0 0 0 0  Risk for fall due to : No Fall Risks No Fall Risks History of fall(s) No Fall Risks Impaired balance/gait;Impaired mobility;History of fall(s)  Follow up Falls evaluation completed Falls evaluation completed Falls evaluation completed Falls evaluation completed Falls evaluation completed;Education provided;Falls prevention discussed    MEDICARE RISK AT HOME:  Medicare Risk at Home - 10/28/22 1048     Any stairs in or around the home? Yes    If so, are there any without handrails? No    Home free of loose throw rugs in walkways, pet beds, electrical  cords, etc? No    Adequate lighting in your home to reduce risk of falls? Yes    Life alert? No    Use of a cane, walker or w/c? No    Grab bars in the bathroom? Yes    Shower chair or bench in shower? Yes    Elevated toilet seat or a handicapped toilet?  No             TIMED UP AND GO:  Was the test performed?  No    Cognitive Function:    10/06/2018    1:17 PM 09/30/2017   10:04 AM 04/04/2016    3:08 PM  MMSE - Mini Mental State Exam  Orientation to time 5 5 5   Orientation to Place 5 5 5   Registration 3 3 3   Attention/ Calculation 5 4 5   Recall 1 2 2   Language- name 2 objects 2 2 2   Language- repeat 1 1 1   Language- follow 3 step command 3 3 3   Language- read & follow direction 1 1 1   Write a sentence 1 1 1   Copy design 1 0 1  Total score 28 27 29         10/28/2022    9:09 AM 10/25/2021   11:04 AM 10/23/2020    1:09 PM 10/18/2019   10:28 AM  6CIT Screen  What Year? 0 points 0 points 0 points 0 points  What month? 0 points 0 points 0 points 0 points  What time? 0 points 0 points 0 points 0 points  Count back from 20 0 points 0 points 0 points 0 points  Months in reverse 0 points 0 points 0 points 2 points  Repeat phrase 0 points 0 points 0 points 2 points  Total Score 0 points 0 points 0 points 4 points    Immunizations Immunization History  Administered Date(s) Administered   Fluad Quad(high Dose 65+) 12/21/2018, 12/02/2019, 11/23/2020, 04/09/2022   Influenza,inj,Quad PF,6+ Mos 04/04/2016   Influenza-Unspecified 12/29/2013, 12/14/2017   PFIZER(Purple Top)SARS-COV-2 Vaccination 08/18/2019, 09/12/2019   Pneumococcal Conjugate-13 01/04/2016   Pneumococcal Polysaccharide-23 09/30/2017   Tdap 09/29/2015   Zoster Recombinant(Shingrix) 12/05/2017, 06/03/2018    TDAP status: Up to date  Flu Vaccine status: Up to date  Pneumococcal vaccine status: Up to date  Covid-19 vaccine status: Declined, Education has been provided regarding the importance of this vaccine  but patient still declined. Advised may receive this vaccine at local pharmacy or Health Dept.or vaccine clinic. Aware to provide a copy of the vaccination record if obtained from local pharmacy or Health Dept. Verbalized acceptance and understanding.  Qualifies for Shingles Vaccine? Yes   Zostavax completed No   Shingrix Completed?: Yes  Screening Tests Health Maintenance  Topic Date Due   COVID-19 Vaccine (3 - Pfizer risk series) 10/10/2019   MAMMOGRAM  11/06/2021   INFLUENZA VACCINE  10/30/2022   HEMOGLOBIN A1C  11/11/2022   Diabetic kidney evaluation - eGFR measurement  05/14/2023   OPHTHALMOLOGY EXAM  07/24/2023   Diabetic kidney evaluation - Urine ACR  08/18/2023   FOOT EXAM  08/18/2023   Medicare Annual Wellness (AWV)  10/28/2023   DTaP/Tdap/Td (2 - Td or Tdap) 09/28/2025   Colonoscopy  05/14/2032   Pneumonia Vaccine 6+ Years old  Completed   DEXA SCAN  Completed   Hepatitis C Screening  Completed   Zoster Vaccines- Shingrix  Completed   HPV VACCINES  Aged Out   Fecal DNA (Cologuard)  Discontinued    Health Maintenance  Health Maintenance Due  Topic Date Due   COVID-19 Vaccine (3 - Pfizer risk series) 10/10/2019   MAMMOGRAM  11/06/2021    Colorectal cancer screening: Type of screening: Colonoscopy. Completed 05/14/2022. Repeat every 5 years  Mammogram status: Ordered will find a doctor in [place] . Pt provided with contact info and advised to call to schedule appt.  Bone Density status: Completed 3/28/20219. Results reflect: Bone density results: OSTEOPENIA. Repeat every 5 years.  Lung Cancer Screening: (Low Dose CT Chest recommended if Age 38-80 years, 20 pack-year currently smoking OR have quit w/in 15years.) does not qualify.   Lung Cancer Screening Referral: No   Additional Screening:  Hepatitis C Screening: does qualify; Completed yes   Vision Screening: Recommended annual ophthalmology exams for early detection of glaucoma and other disorders of the  eye. Is the patient up to date with their annual eye exam?  Yes  Who is the provider or what is the name of the office in which the patient attends annual eye exams? Recently relocated will get a knee doctor  If pt is not established with a provider, would they like to be referred to a provider to establish care? Yes .   Dental Screening: Recommended annual dental exams for proper oral hygiene  Diabetic Foot Exam: Diabetic Foot Exam: Completed No   Community Resource Referral / Chronic Care Management: CRR required this visit?  No   CCM required this visit?  No     Plan:     I have personally reviewed and noted the following in the patient's chart:   Medical and social history Use of alcohol, tobacco or illicit drugs  Current medications and supplements including opioid prescriptions. Patient is not currently taking opioid prescriptions. Functional ability and status Nutritional status Physical activity Advanced directives List of other physicians Hospitalizations, surgeries, and ER visits in previous 12 months Vitals Screenings to include cognitive, depression, and falls Referrals and appointments  In addition, I have reviewed and discussed with patient certain preventive protocols, quality metrics, and best practice recommendations. A written personalized care plan for preventive services as well as general preventive health recommendations were provided to patient.     Caesar Bookman, NP   10/28/2022   After Visit Summary: (MyChart) Due to this being a telephonic visit, the after visit summary with patients personalized plan was offered to patient via MyChart   Nurse Notes: Due for mammogram Declined COVID-19 vaccine   I connected with  Kaitlyn Wood on 10/28/22 by a video enabled telemedicine application and verified that I am speaking with the correct person using two identifiers.   I discussed the limitations of evaluation and management by telemedicine. The  patient expressed understanding and agreed to proceed.   Spent 20 minutes of face to face with patient  >50% time spent counseling; reviewing medical record; tests; labs; and developing future plan of care.

## 2022-11-02 NOTE — Patient Instructions (Signed)
Kaitlyn Wood , Thank you for taking time to come for your Medicare Wellness Visit. I appreciate your ongoing commitment to your health goals. Please review the following plan we discussed and let me know if I can assist you in the future.   Screening recommendations/referrals: Colonoscopy : Up-to-date Mammogram due Bone Density up-to-date Recommended yearly ophthalmology/optometry visit for glaucoma screening and checkup Recommended yearly dental visit for hygiene and checkup  Vaccinations: Influenza vaccine- due annually in September/October Pneumococcal vaccine up-to-date Tdap vaccine up-to-date Shingles vaccine up-to-date  Advanced directives: Yes  Conditions/risks identified: advanced age (>47men, >43 women);diabetes mellitus;hypertension;obesity (BMI >30kg/m2)  Next appointment: 1 year   Preventive Care 9 Years and Older, Female Preventive care refers to lifestyle choices and visits with your health care provider that can promote health and wellness. What does preventive care include? A yearly physical exam. This is also called an annual well check. Dental exams once or twice a year. Routine eye exams. Ask your health care provider how often you should have your eyes checked. Personal lifestyle choices, including: Daily care of your teeth and gums. Regular physical activity. Eating a healthy diet. Avoiding tobacco and drug use. Limiting alcohol use. Practicing safe sex. Taking low-dose aspirin every day. Taking vitamin and mineral supplements as recommended by your health care provider. What happens during an annual well check? The services and screenings done by your health care provider during your annual well check will depend on your age, overall health, lifestyle risk factors, and family history of disease. Counseling  Your health care provider may ask you questions about your: Alcohol use. Tobacco use. Drug use. Emotional well-being. Home and relationship  well-being. Sexual activity. Eating habits. History of falls. Memory and ability to understand (cognition). Work and work Astronomer. Reproductive health. Screening  You may have the following tests or measurements: Height, weight, and BMI. Blood pressure. Lipid and cholesterol levels. These may be checked every 5 years, or more frequently if you are over 76 years old. Skin check. Lung cancer screening. You may have this screening every year starting at age 83 if you have a 30-pack-year history of smoking and currently smoke or have quit within the past 15 years. Fecal occult blood test (FOBT) of the stool. You may have this test every year starting at age 60. Flexible sigmoidoscopy or colonoscopy. You may have a sigmoidoscopy every 5 years or a colonoscopy every 10 years starting at age 41. Hepatitis C blood test. Hepatitis B blood test. Sexually transmitted disease (STD) testing. Diabetes screening. This is done by checking your blood sugar (glucose) after you have not eaten for a while (fasting). You may have this done every 1-3 years. Bone density scan. This is done to screen for osteoporosis. You may have this done starting at age 73. Mammogram. This may be done every 1-2 years. Talk to your health care provider about how often you should have regular mammograms. Talk with your health care provider about your test results, treatment options, and if necessary, the need for more tests. Vaccines  Your health care provider may recommend certain vaccines, such as: Influenza vaccine. This is recommended every year. Tetanus, diphtheria, and acellular pertussis (Tdap, Td) vaccine. You may need a Td booster every 10 years. Zoster vaccine. You may need this after age 72. Pneumococcal 13-valent conjugate (PCV13) vaccine. One dose is recommended after age 60. Pneumococcal polysaccharide (PPSV23) vaccine. One dose is recommended after age 27. Talk to your health care provider about which  screenings and vaccines you  need and how often you need them. This information is not intended to replace advice given to you by your health care provider. Make sure you discuss any questions you have with your health care provider. Document Released: 04/13/2015 Document Revised: 12/05/2015 Document Reviewed: 01/16/2015 Elsevier Interactive Patient Education  2017 [employer].  Fall Prevention in the Home Falls can cause injuries. They can happen to people of all ages. There are many things you can do to make your home safe and to help prevent falls. What can I do on the outside of my home? Regularly fix the edges of walkways and driveways and fix any cracks. Remove anything that might make you trip as you walk through a door, such as a raised step or threshold. Trim any bushes or trees on the path to your home. Use bright outdoor lighting. Clear any walking paths of anything that might make someone trip, such as rocks or tools. Regularly check to see if handrails are loose or broken. Make sure that both sides of any steps have handrails. Any raised decks and porches should have guardrails on the edges. Have any leaves, snow, or ice cleared regularly. Use sand or salt on walking paths during winter. Clean up any spills in your garage right away. This includes oil or grease spills. What can I do in the bathroom? Use night lights. Install grab bars by the toilet and in the tub and shower. Do not use towel bars as grab bars. Use non-skid mats or decals in the tub or shower. If you need to sit down in the shower, use a plastic, non-slip stool. Keep the floor dry. Clean up any water that spills on the floor as soon as it happens. Remove soap buildup in the tub or shower regularly. Attach bath mats securely with double-sided non-slip rug tape. Do not have throw rugs and other things on the floor that can make you trip. What can I do in the bedroom? Use night lights. Make sure that you have a  light by your bed that is easy to reach. Do not use any sheets or blankets that are too big for your bed. They should not hang down onto the floor. Have a firm chair that has side arms. You can use this for support while you get dressed. Do not have throw rugs and other things on the floor that can make you trip. What can I do in the kitchen? Clean up any spills right away. Avoid walking on wet floors. Keep items that you use a lot in easy-to-reach places. If you need to reach something above you, use a strong step stool that has a grab bar. Keep electrical cords out of the way. Do not use floor polish or wax that makes floors slippery. If you must use wax, use non-skid floor wax. Do not have throw rugs and other things on the floor that can make you trip. What can I do with my stairs? Do not leave any items on the stairs. Make sure that there are handrails on both sides of the stairs and use them. Fix handrails that are broken or loose. Make sure that handrails are as long as the stairways. Check any carpeting to make sure that it is firmly attached to the stairs. Fix any carpet that is loose or worn. Avoid having throw rugs at the top or bottom of the stairs. If you do have throw rugs, attach them to the floor with carpet tape. Make sure that you  have a light switch at the top of the stairs and the bottom of the stairs. If you do not have them, ask someone to add them for you. What else can I do to help prevent falls? Wear shoes that: Do not have high heels. Have rubber bottoms. Are comfortable and fit you well. Are closed at the toe. Do not wear sandals. If you use a stepladder: Make sure that it is fully opened. Do not climb a closed stepladder. Make sure that both sides of the stepladder are locked into place. Ask someone to hold it for you, if possible. Clearly mark and make sure that you can see: Any grab bars or handrails. First and last steps. Where the edge of each step  is. Use tools that help you move around (mobility aids) if they are needed. These include: Canes. Walkers. Scooters. Crutches. Turn on the lights when you go into a dark area. Replace any light bulbs as soon as they burn out. Set up your furniture so you have a clear path. Avoid moving your furniture around. If any of your floors are uneven, fix them. If there are any pets around you, be aware of where they are. Review your medicines with your doctor. Some medicines can make you feel dizzy. This can increase your chance of falling. Ask your doctor what other things that you can do to help prevent falls. This information is not intended to replace advice given to you by your health care provider. Make sure you discuss any questions you have with your health care provider. Document Released: 01/11/2009 Document Revised: 08/23/2015 Document Reviewed: 04/21/2014 Elsevier Interactive Patient Education  2017 [employer].

## 2022-11-06 ENCOUNTER — Other Ambulatory Visit: Payer: Self-pay | Admitting: Family

## 2022-11-09 ENCOUNTER — Other Ambulatory Visit: Payer: Self-pay | Admitting: Family

## 2022-11-09 DIAGNOSIS — E1169 Type 2 diabetes mellitus with other specified complication: Secondary | ICD-10-CM

## 2022-11-18 ENCOUNTER — Telehealth: Payer: Self-pay | Admitting: Family

## 2022-11-18 NOTE — Telephone Encounter (Signed)
Patient called requesting I email her medical records for the past year. Patient has moved to another state and will not be coming back to [facility].

## 2022-11-21 ENCOUNTER — Other Ambulatory Visit: Payer: Self-pay | Admitting: Family

## 2022-11-21 DIAGNOSIS — E1159 Type 2 diabetes mellitus with other circulatory complications: Secondary | ICD-10-CM

## 2022-11-21 NOTE — Telephone Encounter (Signed)
Verify with patient if she needs refill.Patient moved out of [place].

## 2022-11-21 NOTE — Telephone Encounter (Signed)
High Risk Warning Populated when attempting to refill, I will send to Provider for further review 

## 2022-12-02 ENCOUNTER — Other Ambulatory Visit: Payer: Self-pay | Admitting: Family

## 2022-12-02 DIAGNOSIS — G894 Chronic pain syndrome: Secondary | ICD-10-CM

## 2023-02-13 ENCOUNTER — Other Ambulatory Visit: Payer: Self-pay | Admitting: Family

## 2023-02-13 DIAGNOSIS — I10 Essential (primary) hypertension: Secondary | ICD-10-CM

## 2023-02-13 DIAGNOSIS — E1169 Type 2 diabetes mellitus with other specified complication: Secondary | ICD-10-CM

## 2023-02-18 ENCOUNTER — Telehealth: Payer: Medicare HMO | Admitting: Family

## 2023-03-10 ENCOUNTER — Other Ambulatory Visit: Payer: Self-pay | Admitting: Family

## 2023-03-10 DIAGNOSIS — G894 Chronic pain syndrome: Secondary | ICD-10-CM

## 2023-03-10 NOTE — Telephone Encounter (Signed)
Patient request refill on medication Gabapentin. Patient medication last refilled 12/03/2022. Patient only given 90 day supply.

## 2023-03-11 NOTE — Telephone Encounter (Signed)
Patient moved out of states no longer my patient.please update records.Has establish with another PCP.

## 2023-03-11 NOTE — Telephone Encounter (Signed)
Completed.

## 2023-03-16 ENCOUNTER — Telehealth: Payer: Self-pay | Admitting: Neurology

## 2023-03-16 NOTE — Telephone Encounter (Signed)
Pt states lately she has been more dizzy to the point she hasn't been able to walk straight without holding onto a wall. Also her vision went doubled while driving the other day. Requesting call back

## 2023-03-17 NOTE — Telephone Encounter (Signed)
Called the patient back. There was no answer. LVM asking for the pt to return call.   **IF pt returns call, she has not been seen since march and really per last visit It was determined the dizzy spells were of unclear etiology possibly atypical migraine versus peripheral vestibular dysfunction. Dr Pearlean Brownie didn't recommend any further neurovascular testing time.  Given this is starting back up, may be beneficial to follow with primary care first to ensure blood pressure is doing well and make sure there is no need for vestibular rehab first before we follow up here in the office.

## 2023-03-18 NOTE — Telephone Encounter (Signed)
Pt called, message from [place], [place] was relayed.  Pt asked that I let Baird Lyons know she did call her back and will f/u with PCP

## 2023-04-02 ENCOUNTER — Other Ambulatory Visit: Payer: Self-pay | Admitting: Family

## 2023-04-02 DIAGNOSIS — G894 Chronic pain syndrome: Secondary | ICD-10-CM

## 2023-04-19 ENCOUNTER — Other Ambulatory Visit: Payer: Self-pay | Admitting: Family

## 2023-04-19 DIAGNOSIS — E1159 Type 2 diabetes mellitus with other circulatory complications: Secondary | ICD-10-CM

## 2023-08-20 ENCOUNTER — Other Ambulatory Visit: Payer: Self-pay | Admitting: Family

## 2023-08-20 DIAGNOSIS — I10 Essential (primary) hypertension: Secondary | ICD-10-CM

## 2024-02-12 IMAGING — US US EXTREM UP*L* LTD
1 series · 14 of 23 positions shown · non-contrast
Comparison: None Available.

CLINICAL DATA: Mass at the tip of the left long finger. Present
since 2122. Denies injury.

EXAM:
ULTRASOUND LEFT UPPER EXTREMITY LIMITED
TECHNIQUE: Ultrasound examination of the upper extremity soft tissues was
performed in the area of clinical concern.

[Series 1: us extrem up*left* ltd · 0.05mm/px · 23 acquisitions, 14 frames shown]
[im 1/23]
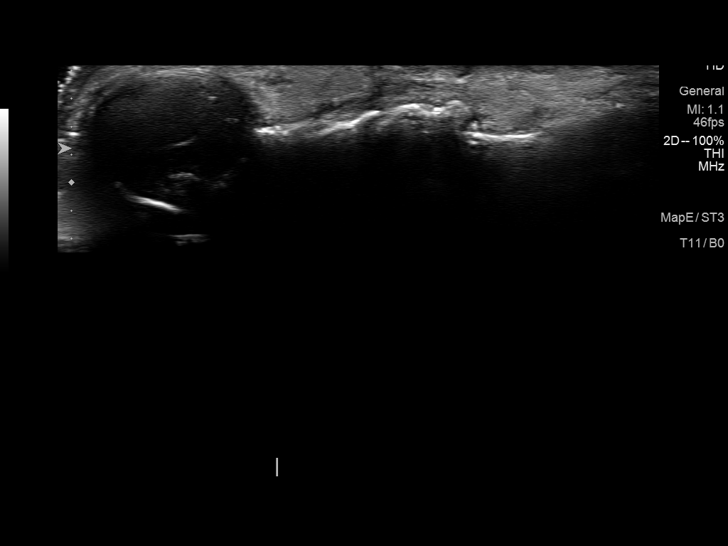
[im 3/23]
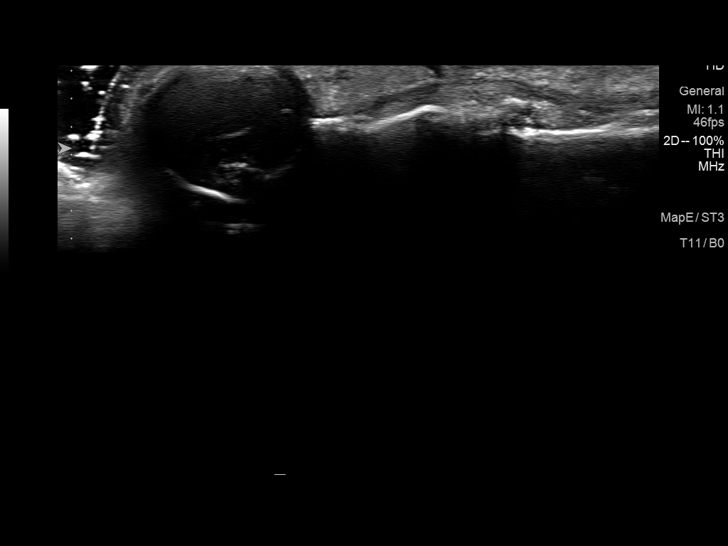
[im 5/23]
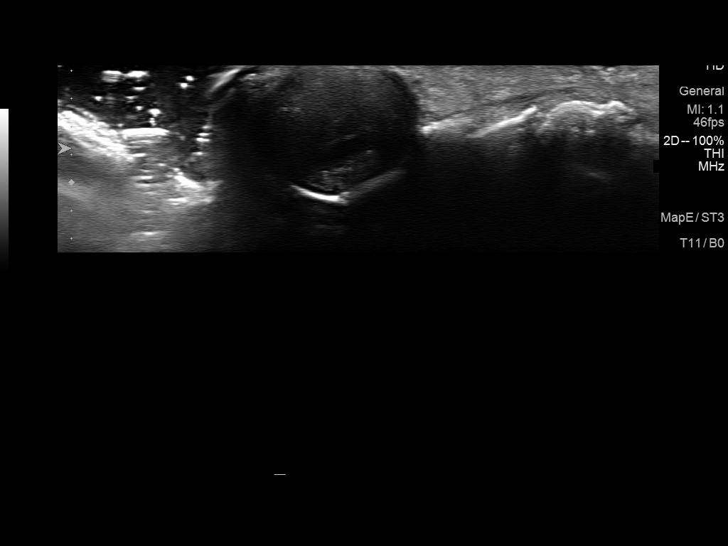
[im 6/23]
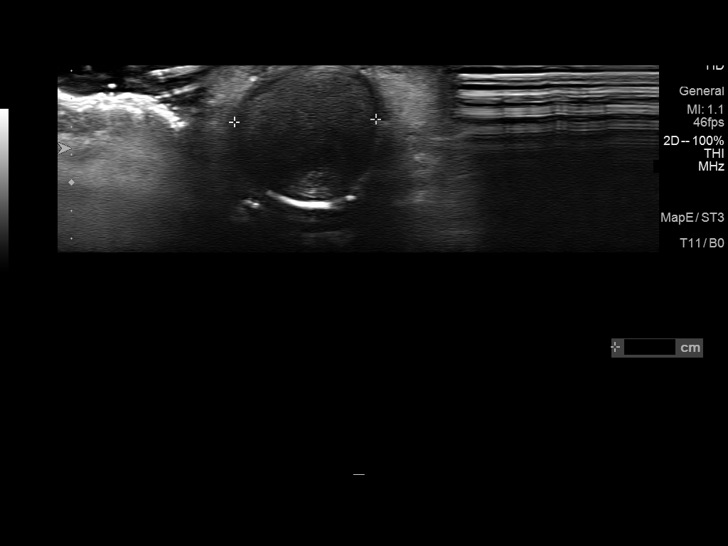
[im 8/23]
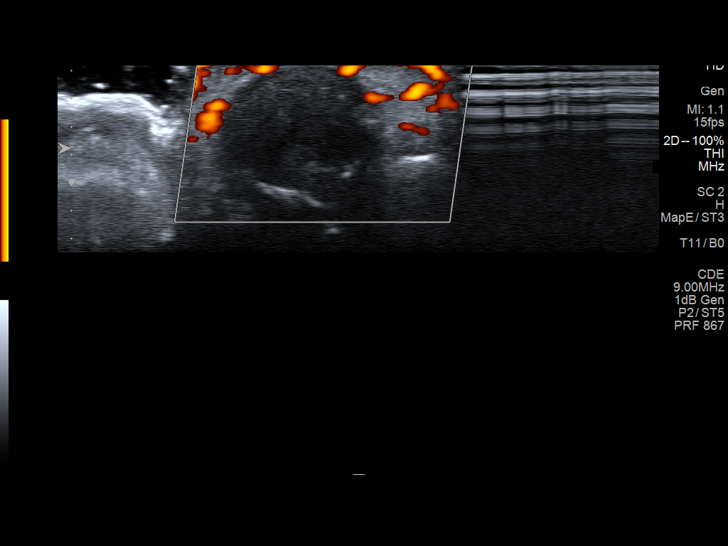
[im 10/23]
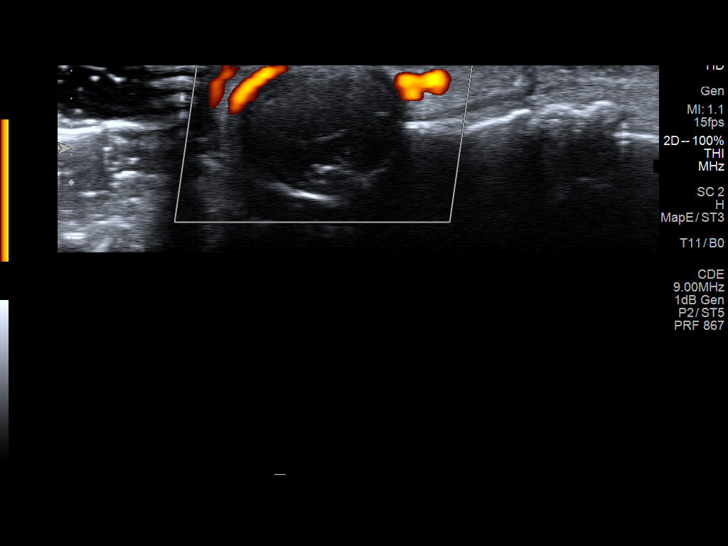
[im 11/23]
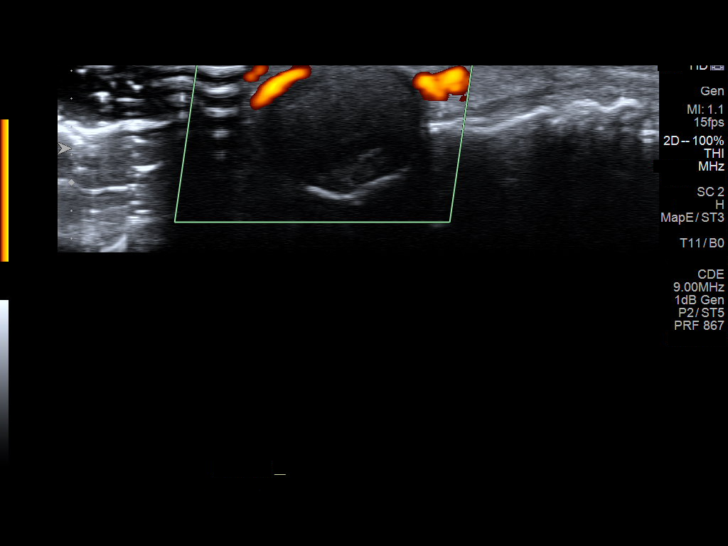
[im 13/23]
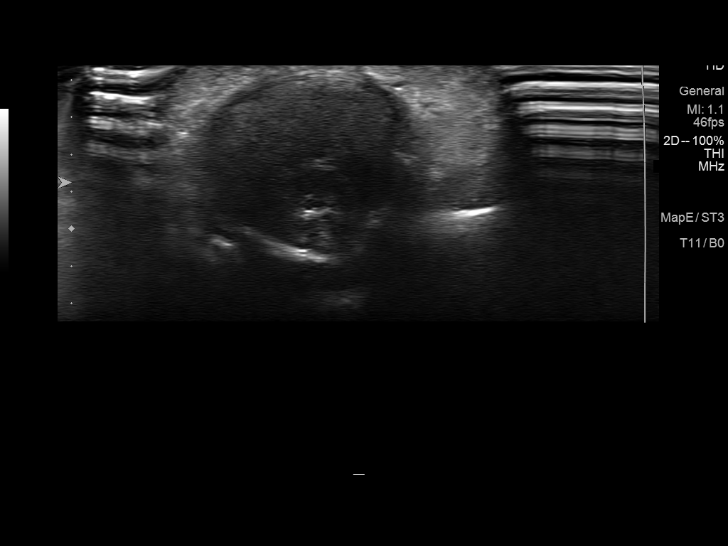
[im 14/23]
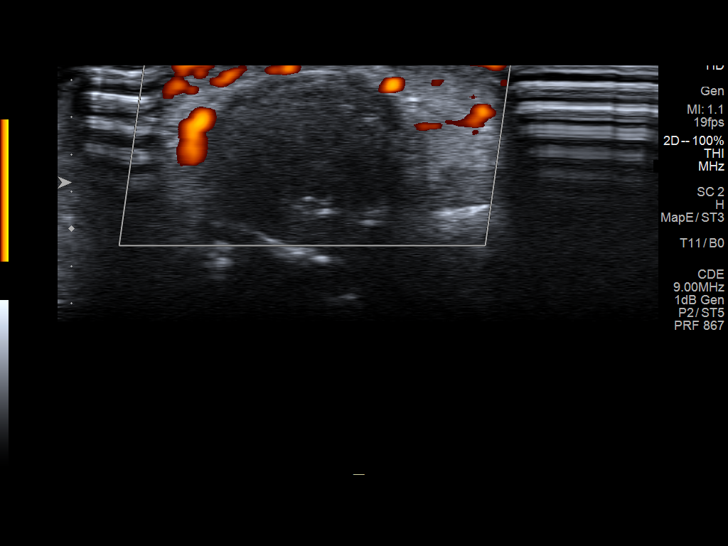
[im 16/23]
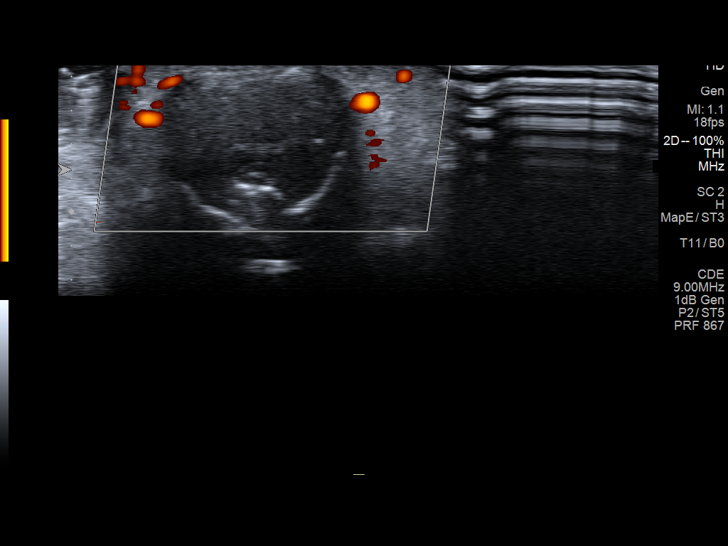
[im 18/23]
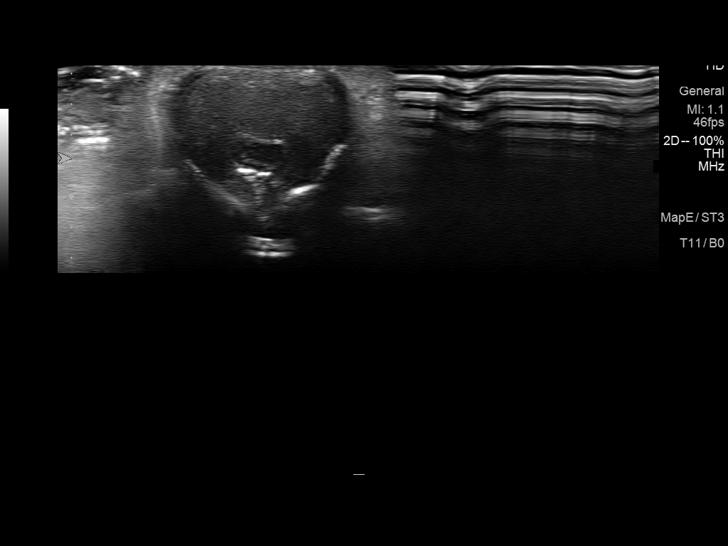
[im 19/23]
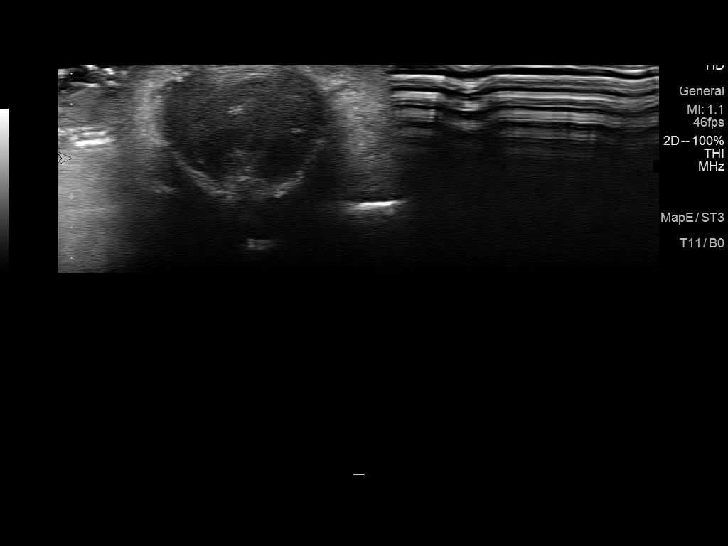
[im 21/23]
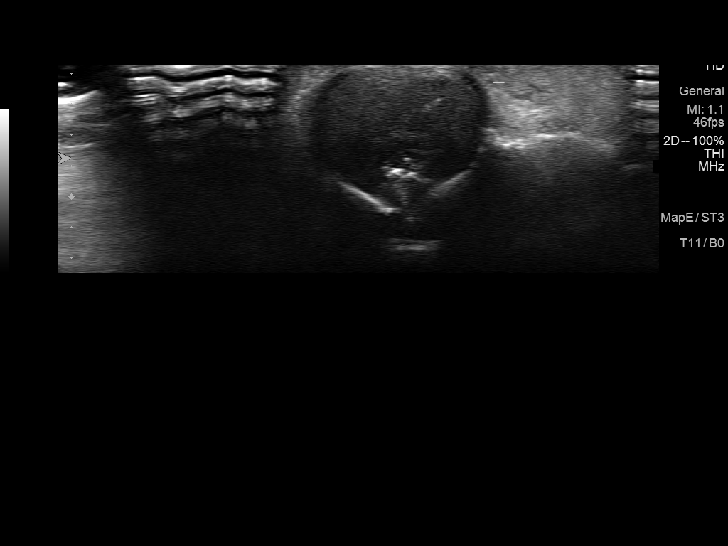
[im 23/23]
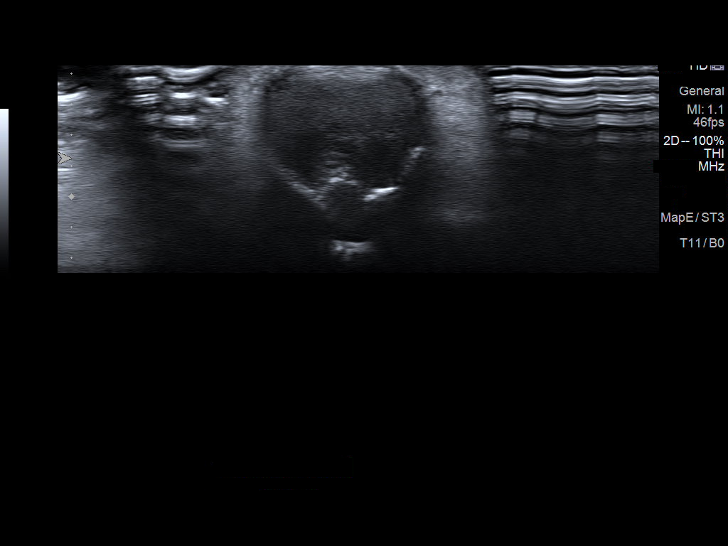

[14 of 23 positions shown; findings below may reference images not displayed]

FINDINGS: Focused ultrasound of the palpable abnormality along the volar
distal left long finger demonstrates a round well-defined 1.2 x
x 1.0 cm containing fairly homogeneous low-level internal echoes
without internal vascularity.
IMPRESSION: 1. 1.2 cm complex lesion along the volar distal left long finger,
likely epidermal inclusion cyst.

## 2024-02-15 IMAGING — CT CT ANGIO HEAD
2 of 10 series · 7 of 34 positions shown · non-contrast
Comparison: None Available.

CLINICAL DATA: Neuro deficit, acute, stroke suspected; waxing and
waning diplopia, intermittent blurry vision, dizziness and
unsteadiness, pain in upper neck and head, concern for carotid bruit
on the left neck

EXAM:
CT ANGIOGRAPHY HEAD AND NECK
TECHNIQUE: Multidetector CT imaging of the head and neck was performed using
the standard protocol during bolus administration of intravenous
contrast. Multiplanar CT image reconstructions and MIPs were
obtained to evaluate the vascular anatomy. Carotid stenosis
measurements (when applicable) are obtained utilizing NASCET
criteria, using the distal internal carotid diameter as the
denominator.

[Series 7: sagittal soft tissue · sagittal · 0.26mm/px · 1 of 48 slices shown]
[im 34/48  soft-tissue]
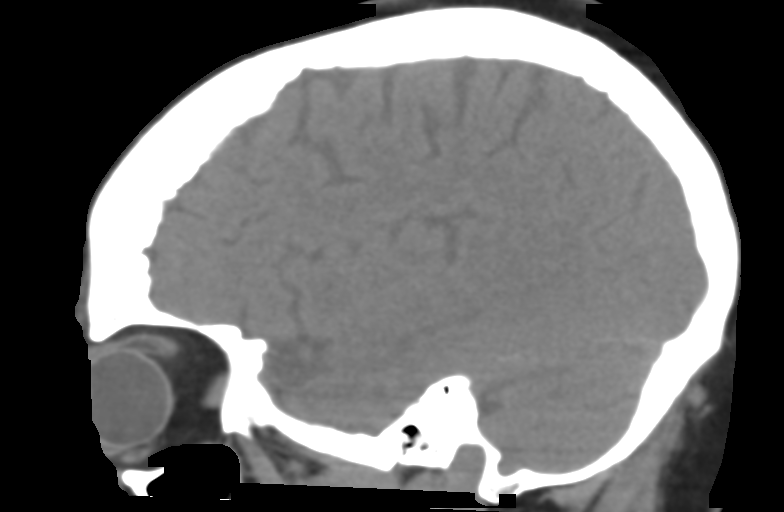

[Series 10: ax thin · axial · 0.53mm/px · z∈[-274,-40]mm · 6 of 328 slices shown]
[im 47/328  soft-tissue]
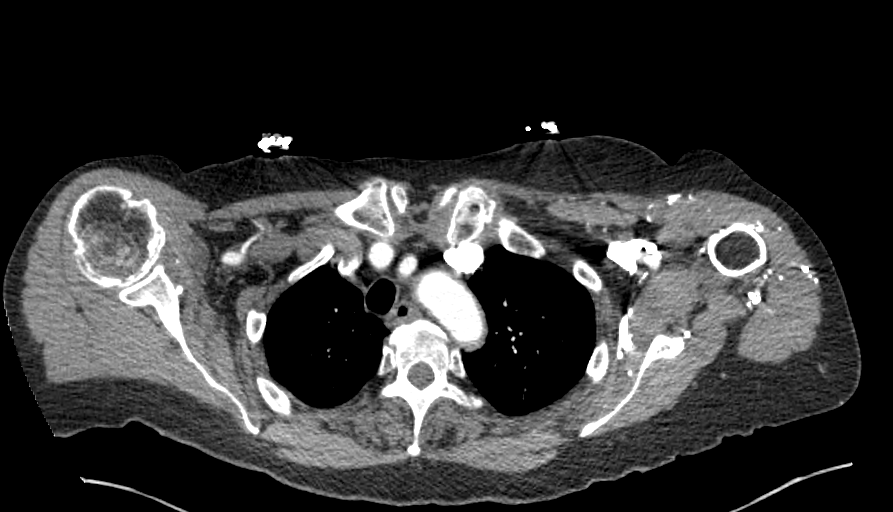
[im 94/328  bone]
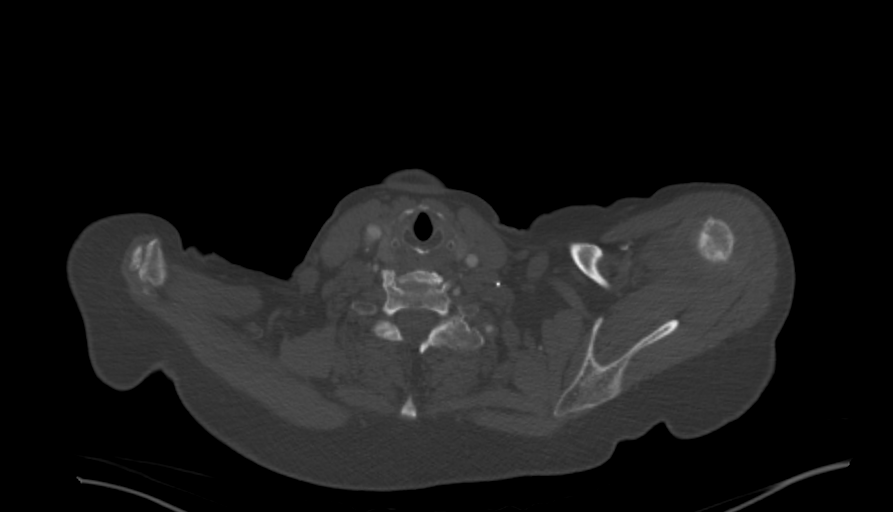
[im 141/328  soft-tissue]
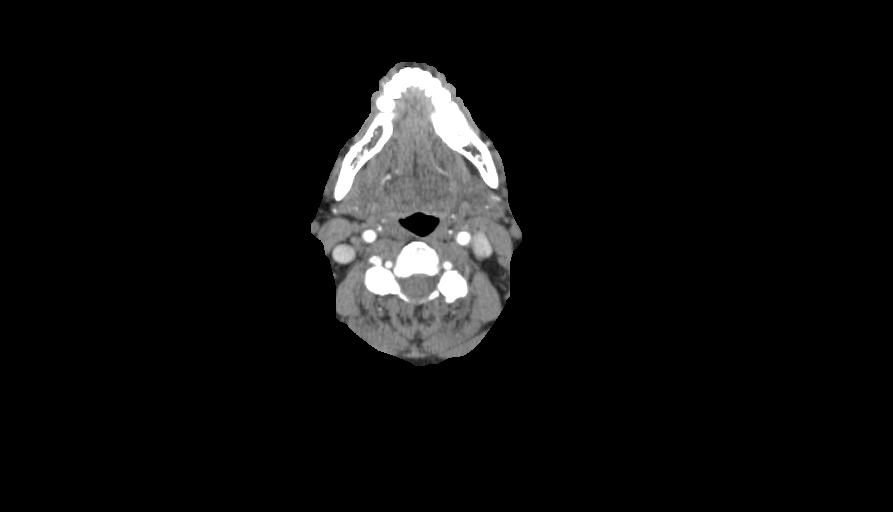
[im 187/328  bone]
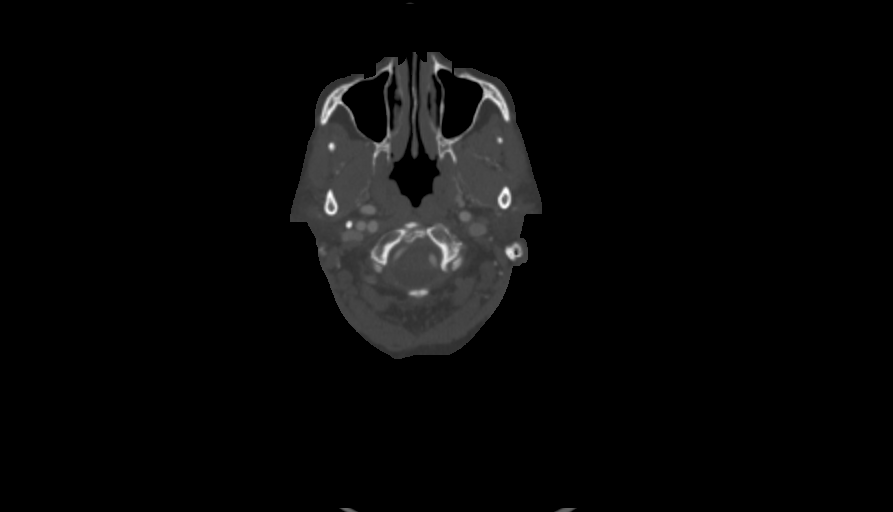
[im 234/328  soft-tissue]
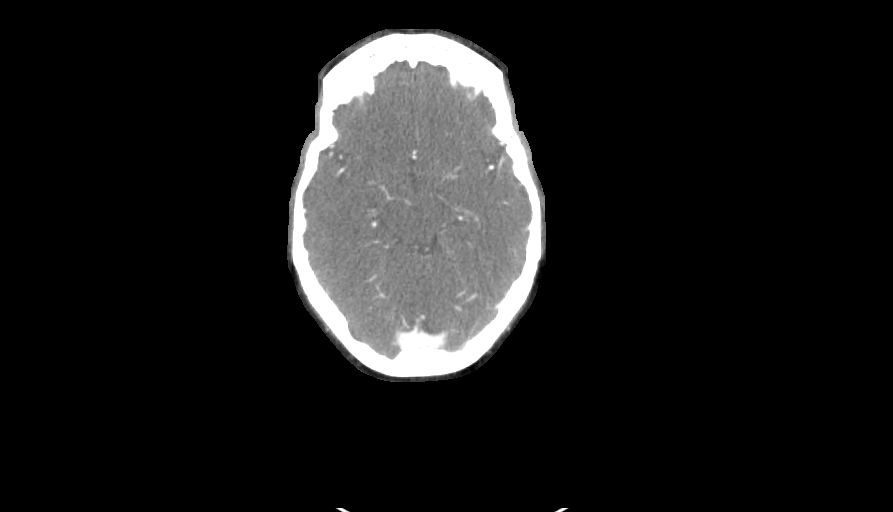
[im 281/328  bone]
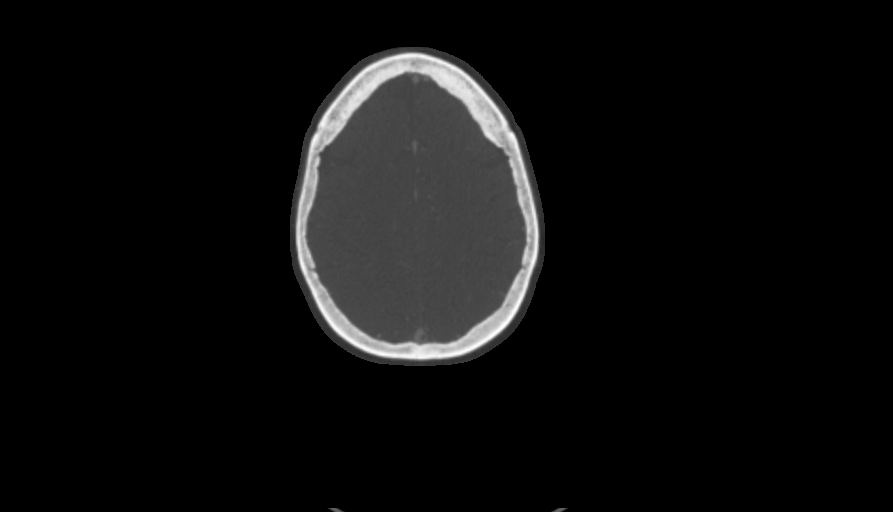

[7 of 34 positions shown; findings below may reference images not displayed]

RADIATION DOSE REDUCTION: This exam was performed according to the
departmental dose-optimization program which includes automated
exposure control, adjustment of the mA and/or kV according to
patient size and/or use of iterative reconstruction technique.

CONTRAST:  75mL OMNIPAQUE IOHEXOL 350 MG/ML SOLN
FINDINGS: CT HEAD

Brain: There is no acute intracranial hemorrhage, mass effect, or
edema. Gray-white differentiation is preserved. There is no
extra-axial fluid collection. Ventricles and sulci are within normal
limits in size and configuration.

Vascular: No hyperdense vessel.

Skull: Calvarium is unremarkable.

Sinuses/Orbits: No acute finding.

Other: None.

Review of the MIP images confirms the above findings

CTA NECK

Aortic arch: Mild calcified plaque. Great vessel origins are patent.

Right carotid system: Patent. Trace calcified plaque at the
bifurcation. No stenosis.

Left carotid system: Patent.  No stenosis.

Vertebral arteries: Patent and codominant.  No stenosis.

Skeleton: Degenerative changes of the included spine.

Other neck: Unremarkable.

Upper chest: Included lungs are clear.

Review of the MIP images confirms the above findings

CTA HEAD

Anterior circulation: Intracranial internal carotid arteries are
patent. Anterior and middle cerebral arteries are patent.

Posterior circulation: Intracranial vertebral arteries, basilar
artery, and posterior cerebral arteries are patent. Left posterior
communicating artery is present.

Venous sinuses: Patent as allowed by contrast bolus timing.

Review of the MIP images confirms the above findings
IMPRESSION: No acute intracranial abnormality.

No large vessel occlusion, hemodynamically significant stenosis, or
evidence of dissection.

## 2024-03-15 ENCOUNTER — Other Ambulatory Visit: Payer: Self-pay | Admitting: Family

## 2024-03-15 DIAGNOSIS — E1159 Type 2 diabetes mellitus with other circulatory complications: Secondary | ICD-10-CM
